# Patient Record
Sex: Female | Born: 1960 | Race: Black or African American | Hispanic: No | Marital: Single | Smoking: Never smoker
Health system: Southern US, Community
[De-identification: ages and names within clinical notes are randomized; demographics above are authoritative.]

## PROBLEM LIST (undated history)

## (undated) DIAGNOSIS — K635 Polyp of colon: Secondary | ICD-10-CM

## (undated) DIAGNOSIS — I10 Essential (primary) hypertension: Secondary | ICD-10-CM

## (undated) DIAGNOSIS — Z862 Personal history of diseases of the blood and blood-forming organs and certain disorders involving the immune mechanism: Secondary | ICD-10-CM

## (undated) DIAGNOSIS — M2550 Pain in unspecified joint: Secondary | ICD-10-CM

## (undated) DIAGNOSIS — M1990 Unspecified osteoarthritis, unspecified site: Secondary | ICD-10-CM

## (undated) DIAGNOSIS — F419 Anxiety disorder, unspecified: Secondary | ICD-10-CM

## (undated) DIAGNOSIS — F329 Major depressive disorder, single episode, unspecified: Secondary | ICD-10-CM

## (undated) DIAGNOSIS — M549 Dorsalgia, unspecified: Secondary | ICD-10-CM

## (undated) DIAGNOSIS — K259 Gastric ulcer, unspecified as acute or chronic, without hemorrhage or perforation: Secondary | ICD-10-CM

## (undated) DIAGNOSIS — R079 Chest pain, unspecified: Secondary | ICD-10-CM

## (undated) DIAGNOSIS — R519 Headache, unspecified: Secondary | ICD-10-CM

## (undated) DIAGNOSIS — E039 Hypothyroidism, unspecified: Secondary | ICD-10-CM

## (undated) DIAGNOSIS — E079 Disorder of thyroid, unspecified: Secondary | ICD-10-CM

## (undated) DIAGNOSIS — T7840XA Allergy, unspecified, initial encounter: Secondary | ICD-10-CM

## (undated) DIAGNOSIS — F32A Depression, unspecified: Secondary | ICD-10-CM

## (undated) DIAGNOSIS — K589 Irritable bowel syndrome without diarrhea: Secondary | ICD-10-CM

## (undated) DIAGNOSIS — E0500 Thyrotoxicosis with diffuse goiter without thyrotoxic crisis or storm: Secondary | ICD-10-CM

## (undated) DIAGNOSIS — Z8489 Family history of other specified conditions: Secondary | ICD-10-CM

## (undated) DIAGNOSIS — E559 Vitamin D deficiency, unspecified: Secondary | ICD-10-CM

## (undated) DIAGNOSIS — G43909 Migraine, unspecified, not intractable, without status migrainosus: Secondary | ICD-10-CM

## (undated) DIAGNOSIS — R600 Localized edema: Secondary | ICD-10-CM

## (undated) DIAGNOSIS — D649 Anemia, unspecified: Secondary | ICD-10-CM

## (undated) DIAGNOSIS — K219 Gastro-esophageal reflux disease without esophagitis: Secondary | ICD-10-CM

## (undated) DIAGNOSIS — E785 Hyperlipidemia, unspecified: Secondary | ICD-10-CM

## (undated) HISTORY — PX: JOINT REPLACEMENT: SHX530

## (undated) HISTORY — PX: GASTRIC BYPASS: SHX52

## (undated) HISTORY — DX: Anxiety disorder, unspecified: F41.9

## (undated) HISTORY — DX: Irritable bowel syndrome, unspecified: K58.9

## (undated) HISTORY — DX: Hypothyroidism, unspecified: E03.9

## (undated) HISTORY — PX: POLYPECTOMY: SHX149

## (undated) HISTORY — DX: Pain in unspecified joint: M25.50

## (undated) HISTORY — DX: Gastro-esophageal reflux disease without esophagitis: K21.9

## (undated) HISTORY — DX: Anemia, unspecified: D64.9

## (undated) HISTORY — DX: Chest pain, unspecified: R07.9

## (undated) HISTORY — DX: Hyperlipidemia, unspecified: E78.5

## (undated) HISTORY — DX: Localized edema: R60.0

## (undated) HISTORY — DX: Polyp of colon: K63.5

## (undated) HISTORY — DX: Gastric ulcer, unspecified as acute or chronic, without hemorrhage or perforation: K25.9

## (undated) HISTORY — DX: Migraine, unspecified, not intractable, without status migrainosus: G43.909

## (undated) HISTORY — PX: COLONOSCOPY: SHX174

## (undated) HISTORY — DX: Unspecified osteoarthritis, unspecified site: M19.90

## (undated) HISTORY — DX: Allergy, unspecified, initial encounter: T78.40XA

## (undated) HISTORY — DX: Vitamin D deficiency, unspecified: E55.9

## (undated) HISTORY — DX: Dorsalgia, unspecified: M54.9

---
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
## 2013-06-27 ENCOUNTER — Encounter (HOSPITAL_COMMUNITY): Payer: Self-pay | Admitting: *Deleted

## 2013-06-27 ENCOUNTER — Inpatient Hospital Stay (HOSPITAL_COMMUNITY): Payer: Self-pay

## 2013-06-27 ENCOUNTER — Emergency Department (HOSPITAL_COMMUNITY): Payer: Self-pay

## 2013-06-27 ENCOUNTER — Inpatient Hospital Stay (HOSPITAL_COMMUNITY)
Admission: EM | Admit: 2013-06-27 | Discharge: 2013-06-29 | DRG: 312 | Disposition: A | Discharge status: Home / Self Care | Payer: Self-pay | Attending: Internal Medicine | Admitting: Internal Medicine

## 2013-06-27 DIAGNOSIS — D250 Submucous leiomyoma of uterus: Secondary | ICD-10-CM | POA: Diagnosis present

## 2013-06-27 DIAGNOSIS — M545 Low back pain, unspecified: Secondary | ICD-10-CM | POA: Diagnosis present

## 2013-06-27 DIAGNOSIS — D219 Benign neoplasm of connective and other soft tissue, unspecified: Secondary | ICD-10-CM

## 2013-06-27 DIAGNOSIS — I951 Orthostatic hypotension: Principal | ICD-10-CM | POA: Diagnosis present

## 2013-06-27 DIAGNOSIS — E876 Hypokalemia: Secondary | ICD-10-CM | POA: Diagnosis present

## 2013-06-27 DIAGNOSIS — M19019 Primary osteoarthritis, unspecified shoulder: Secondary | ICD-10-CM | POA: Diagnosis present

## 2013-06-27 DIAGNOSIS — E860 Dehydration: Secondary | ICD-10-CM | POA: Diagnosis present

## 2013-06-27 DIAGNOSIS — N179 Acute kidney failure, unspecified: Secondary | ICD-10-CM

## 2013-06-27 DIAGNOSIS — Z885 Allergy status to narcotic agent status: Secondary | ICD-10-CM

## 2013-06-27 DIAGNOSIS — R55 Syncope and collapse: Secondary | ICD-10-CM | POA: Diagnosis present

## 2013-06-27 DIAGNOSIS — G43909 Migraine, unspecified, not intractable, without status migrainosus: Secondary | ICD-10-CM | POA: Diagnosis present

## 2013-06-27 DIAGNOSIS — I959 Hypotension, unspecified: Secondary | ICD-10-CM

## 2013-06-27 DIAGNOSIS — D649 Anemia, unspecified: Secondary | ICD-10-CM | POA: Diagnosis present

## 2013-06-27 DIAGNOSIS — Q762 Congenital spondylolisthesis: Secondary | ICD-10-CM

## 2013-06-27 DIAGNOSIS — M1990 Unspecified osteoarthritis, unspecified site: Secondary | ICD-10-CM | POA: Insufficient documentation

## 2013-06-27 DIAGNOSIS — G8929 Other chronic pain: Secondary | ICD-10-CM

## 2013-06-27 DIAGNOSIS — M519 Unspecified thoracic, thoracolumbar and lumbosacral intervertebral disc disorder: Secondary | ICD-10-CM | POA: Diagnosis present

## 2013-06-27 DIAGNOSIS — M5126 Other intervertebral disc displacement, lumbar region: Secondary | ICD-10-CM | POA: Diagnosis present

## 2013-06-27 DIAGNOSIS — G4700 Insomnia, unspecified: Secondary | ICD-10-CM | POA: Diagnosis present

## 2013-06-27 DIAGNOSIS — G8911 Acute pain due to trauma: Secondary | ICD-10-CM | POA: Diagnosis present

## 2013-06-27 DIAGNOSIS — I10 Essential (primary) hypertension: Secondary | ICD-10-CM | POA: Diagnosis present

## 2013-06-27 DIAGNOSIS — IMO0002 Reserved for concepts with insufficient information to code with codable children: Secondary | ICD-10-CM

## 2013-06-27 DIAGNOSIS — E039 Hypothyroidism, unspecified: Secondary | ICD-10-CM | POA: Diagnosis present

## 2013-06-27 DIAGNOSIS — K5900 Constipation, unspecified: Secondary | ICD-10-CM | POA: Diagnosis present

## 2013-06-27 DIAGNOSIS — Z9884 Bariatric surgery status: Secondary | ICD-10-CM

## 2013-06-27 HISTORY — DX: Hypokalemia: E87.6

## 2013-06-27 HISTORY — DX: Other chronic pain: G89.29

## 2013-06-27 HISTORY — DX: Syncope and collapse: R55

## 2013-06-27 HISTORY — DX: Disorder of thyroid, unspecified: E07.9

## 2013-06-27 HISTORY — DX: Acute kidney failure, unspecified: N17.9

## 2013-06-27 HISTORY — DX: Essential (primary) hypertension: I10

## 2013-06-27 LAB — CBC
HCT: 37.7 % (ref 36.0–46.0)
Hemoglobin: 13.4 g/dL (ref 12.0–15.0)
MCH: 29.5 pg (ref 26.0–34.0)
MCHC: 35.5 g/dL (ref 30.0–36.0)
MCV: 82.9 fL (ref 78.0–100.0)
RDW: 14.6 % (ref 11.5–15.5)

## 2013-06-27 LAB — BASIC METABOLIC PANEL
BUN: 36 mg/dL — ABNORMAL HIGH (ref 6–23)
Calcium: 10.5 mg/dL (ref 8.4–10.5)
Creatinine, Ser: 2.04 mg/dL — ABNORMAL HIGH (ref 0.50–1.10)
GFR calc Af Amer: 31 mL/min — ABNORMAL LOW (ref >90)
GFR calc non Af Amer: 27 mL/min — ABNORMAL LOW (ref >90)
Glucose, Bld: 133 mg/dL — ABNORMAL HIGH (ref 70–99)
Potassium: 2.8 mEq/L — ABNORMAL LOW (ref 3.5–5.1)

## 2013-06-27 MED ORDER — SODIUM CHLORIDE 0.9 % IV SOLN
INTRAVENOUS | Status: AC
  Administered 2013-06-27: 1000 mL via INTRAVENOUS

## 2013-06-27 MED ORDER — ONDANSETRON HCL 4 MG/2ML IJ SOLN
4.0000 mg | Freq: Four times a day (QID) | INTRAMUSCULAR | Status: DC
  Administered 2013-06-28 – 2013-06-29 (×4): 4 mg via INTRAVENOUS
  Filled 2013-06-27 (×6): qty 2

## 2013-06-27 MED ORDER — ACETAMINOPHEN 325 MG PO TABS: 650.0000 mg | ORAL_TABLET | Freq: Four times a day (QID) | ORAL | Status: DC | PRN

## 2013-06-27 MED ORDER — ZOLPIDEM TARTRATE 5 MG PO TABS
5.0000 mg | ORAL_TABLET | Freq: Every evening | ORAL | Status: DC | PRN
  Administered 2013-06-27 – 2013-06-29 (×2): 5 mg via ORAL
  Filled 2013-06-27 (×2): qty 1

## 2013-06-27 MED ORDER — THYROID 30 MG PO TABS
30.0000 mg | ORAL_TABLET | Freq: Every day | ORAL | Status: DC
  Administered 2013-06-28 – 2013-06-29 (×2): 30 mg via ORAL
  Filled 2013-06-27 (×4): qty 1

## 2013-06-27 MED ORDER — POTASSIUM CHLORIDE 10 MEQ/100ML IV SOLN
10.0000 meq | INTRAVENOUS | Status: AC
Start: 2013-06-27 — End: 2013-06-27
  Administered 2013-06-27 (×4): 10 meq via INTRAVENOUS
  Filled 2013-06-27: qty 400

## 2013-06-27 MED ORDER — FENTANYL CITRATE 0.05 MG/ML IJ SOLN
100.0000 ug | Freq: Once | INTRAMUSCULAR | Status: AC
  Administered 2013-06-27: 100 ug via INTRAVENOUS
  Filled 2013-06-27: qty 2

## 2013-06-27 MED ORDER — ASPIRIN EC 81 MG PO TBEC
81.0000 mg | DELAYED_RELEASE_TABLET | Freq: Every day | ORAL | Status: DC
  Administered 2013-06-27 – 2013-06-29 (×3): 81 mg via ORAL
  Filled 2013-06-27 (×3): qty 1

## 2013-06-27 MED ORDER — MORPHINE SULFATE 2 MG/ML IJ SOLN
2.0000 mg | INTRAMUSCULAR | Status: DC | PRN
  Administered 2013-06-27 – 2013-06-28 (×2): 2 mg via INTRAVENOUS
  Filled 2013-06-27 (×3): qty 1

## 2013-06-27 MED ORDER — FENTANYL CITRATE 0.05 MG/ML IJ SOLN: 100.0000 ug | INTRAMUSCULAR | Status: DC | PRN

## 2013-06-27 MED ORDER — ENOXAPARIN SODIUM 40 MG/0.4ML ~~LOC~~ SOLN
40.0000 mg | SUBCUTANEOUS | Status: DC
  Administered 2013-06-27 – 2013-06-28 (×2): 40 mg via SUBCUTANEOUS
  Filled 2013-06-27 (×3): qty 0.4

## 2013-06-27 MED ORDER — SODIUM CHLORIDE 0.9 % IV BOLUS (SEPSIS)
500.0000 mL | Freq: Once | INTRAVENOUS | Status: AC
  Administered 2013-06-27: 500 mL via INTRAVENOUS

## 2013-06-27 MED ORDER — ACETAMINOPHEN 650 MG RE SUPP: 650.0000 mg | Freq: Four times a day (QID) | RECTAL | Status: DC | PRN

## 2013-06-27 MED ORDER — ONDANSETRON HCL 4 MG/2ML IJ SOLN: 4.0000 mg | Freq: Three times a day (TID) | INTRAMUSCULAR | Status: AC | PRN

## 2013-06-27 MED ORDER — KETOROLAC TROMETHAMINE 30 MG/ML IJ SOLN
30.0000 mg | Freq: Once | INTRAMUSCULAR | Status: AC
  Administered 2013-06-27: 30 mg via INTRAVENOUS
  Filled 2013-06-27: qty 1

## 2013-06-27 MED ORDER — SODIUM CHLORIDE 0.9 % IV SOLN
INTRAVENOUS | Status: DC
  Administered 2013-06-28: 1000 mL via INTRAVENOUS
  Administered 2013-06-28 – 2013-06-29 (×2): via INTRAVENOUS
  Administered 2013-06-29: 1000 mL via INTRAVENOUS

## 2013-06-27 MED ORDER — SODIUM CHLORIDE 0.9 % IV BOLUS (SEPSIS)
1000.0000 mL | Freq: Once | INTRAVENOUS | Status: AC
  Administered 2013-06-27: 1000 mL via INTRAVENOUS

## 2013-06-27 MED ORDER — POTASSIUM CHLORIDE CRYS ER 20 MEQ PO TBCR
40.0000 meq | EXTENDED_RELEASE_TABLET | Freq: Once | ORAL | Status: AC
  Administered 2013-06-27: 40 meq via ORAL
  Filled 2013-06-27: qty 2

## 2013-06-27 MED ORDER — ONDANSETRON HCL 4 MG/2ML IJ SOLN
4.0000 mg | Freq: Once | INTRAMUSCULAR | Status: AC
  Administered 2013-06-27: 4 mg via INTRAVENOUS
  Filled 2013-06-27: qty 2

## 2013-06-27 NOTE — H&P (Signed)
[facility] History and Physical  Julie Miranda OVF:[id number] DOB: 11/22/1960 DOA: 06/27/2013  Referring physician:  PCP: Egbert Garibaldi, NP  Specialists:   Chief Complaint: LOC, striking back on sink    HPI: Julie Miranda is a 52 y.o. BF PMHx hypothyroidism, HTN, left shoulder arthritis, S/P Gastric Bypass,  Pt states that she has been having dizziness that has progressed over the last couple of months and now constant. Pt has had several falls related to dizziness. Yesterday patient passed out and fell and struck lower back on sink. Pt is pale and hypotensive at triage. Patient is on Depakote shots has not had a period for for quite some time. Patient also states she takes medicine for high blood pressure and takes an NSAID for headache and back pain. Upon further questioning patient admits to taking doses of NSAIDs (Goody's powder, BC powder). States was given tramadol for migraines, and takes unknown water pill. States recently she doesn't see her PCP regularly is lack of insurance.    Procedure MRI L-spine without contrast 06/27/2013 L4-5: Mild left and borderline right subarticular lateral recess  stenosis due to left paracentral disc protrusion and underlying disc  bulge. Right facet arthropathy noted     Review of Systems: The patient denies anorexia, fever, weight loss,, vision loss, decreased hearing, hoarseness, chest pain, dyspnea on exertion, peripheral edema, balance deficits, hemoptysis, abdominal pain, melena, hematochezia, severe indigestion/heartburn, hematuria, incontinence, genital sores, muscle weakness, suspicious skin lesions, transient blindness, difficulty walking, depression, unusual weight change, abnormal bleeding, enlarged lymph nodes, angioedema, and breast masses.      Past Medical History  Diagnosis Date  . Thyroid disease   . Hypertension    Past Surgical History  Procedure Laterality Date  . Gastric bypass     Social History:  reports  that she has never smoked. She does not have any smokeless tobacco history on file. She reports that she does not drink alcohol or use illicit drugs.    Allergies  Allergen Reactions  . Morphine And Related Nausea And Vomiting    No family history on file.  Prior to Admission medications   Medication Sig Start Date End Date Taking? Authorizing Provider  Estradiol Cypionate (DEPO-ESTRADIOL IM) Inject 1 application into the muscle every 3 (three) months.   Yes Historical Provider, MD  lisinopril-hydrochlorothiazide (PRINZIDE,ZESTORETIC) 20-12.5 MG per tablet Take 1 tablet by mouth daily.   Yes Historical Provider, MD  meloxicam (MOBIC) 15 MG tablet Take 15 mg by mouth daily.   Yes Historical Provider, MD  Menthol, Topical Analgesic, (ICY HOT EX) Apply 1 application topically 2 (two) times daily as needed. por pain/spasms   Yes Historical Provider, MD  predniSONE (DELTASONE) 20 MG tablet Take 20 mg by mouth 3 (three) times daily.   Yes Historical Provider, MD  thyroid (ARMOUR) 30 MG tablet Take 30 mg by mouth daily before breakfast.   Yes Historical Provider, MD  traMADol (ULTRAM) 50 MG tablet Take 50 mg by mouth every 6 (six) hours as needed for pain.   Yes Historical Provider, MD   Physical Exam: Filed Vitals:   06/27/13 1330 06/27/13 1433 06/27/13 1445 06/27/13 1500  BP: 97/66 103/64 101/63 103/65  Pulse: 63 77 72 72  Temp:      TempSrc:      Resp: 31 16 15 14   SpO2: 100% 98% 100% 99%     General: Alert, uncomfortable secondary to back pain  Eyes: Pupils and react to light and accommodation  Neck: Negative  JVD  Cardiovascular: Regular rhythm and rate, negative murmurs rubs gallops, DP/PT pulse 2+ bilateral  Respiratory: Good auscultation bilateral  Abdomen: Soft, nontender, nondistended plus bowel sounds  Musculoskeletal: Pain to palpation T12-S1 midline  Neurologic: Equal round reactive to light and accommodation, nerves II through XII intact, moves all extremities to  command upper extremity/lower extremities strength 5/5, chronic bilateral feet paresthesia  Labs on Admission:  Basic Metabolic Panel:  Recent Labs Lab 06/27/13 1008  NA 136  K 2.8*  CL 97  CO2 22  GLUCOSE 133*  BUN 36*  CREATININE 2.04*  CALCIUM 10.5   Liver Function Tests: No results found for this basename: AST, ALT, ALKPHOS, BILITOT, PROT, ALBUMIN,  in the last 168 hours No results found for this basename: LIPASE, AMYLASE,  in the last 168 hours No results found for this basename: AMMONIA,  in the last 168 hours CBC:  Recent Labs Lab 06/27/13 1008  WBC 3.4*  HGB 13.4  HCT 37.7  MCV 82.9  PLT 218   Cardiac Enzymes: No results found for this basename: CKTOTAL, CKMB, CKMBINDEX, TROPONINI,  in the last 168 hours  BNP (last 3 results) No results found for this basename: PROBNP,  in the last 8760 hours CBG:  Recent Labs Lab 06/27/13 1127  GLUCAP 91    Radiological Exams on Admission: Mr Lumbar Spine Wo Contrast  06/27/2013   CLINICAL DATA:  Low back pain. Fall in shower. Left radiculopathy.  EXAM: MRI LUMBAR SPINE WITHOUT CONTRAST  TECHNIQUE: Multiplanar, multisequence MR imaging was performed. No intravenous contrast was administered.  COMPARISON:  None.  FINDINGS: Unusual appearance of the visualize uterus with endometrium at 1.5 cm anterior-posterior, with several small submucosal fibroids measuring up to 9 mm in short axis, and with heterogeneous fundal signal intensity on T2 weighted images. The entirety of the uterus is not imaged.  There is straightening of the normal lumbar lordosis. The lowest lumbar type non-rib-bearing vertebra is labeled as L5. The conus medullaris appears normal. Conus level: L1.  There is 3 mm degenerative anterolisthesis at L4-5 with mild loss of intervertebral disc height and disc desiccation.  Despite efforts by the technologist and patient, motion artifact is present on today's exam and could not be eliminated. This reduces exam  sensitivity and specificity. Degenerative facet arthropathy noted on the right at L4-5 with subcortical cysts and low level subcortical edema.  Additional findings at individual levels are as follows:  L1-2: Unremarkable.  L2-3: Unremarkable.  L3-4: Unremarkable.  L4-5: Mild left and borderline right subarticular lateral recess stenosis due to left paracentral disc protrusion and underlying disc bulge. Right facet arthropathy noted.  L5-S1:  No impingement. Diffuse disc bulge.  IMPRESSION: 1. Mild left eccentric impingement at L4-5 due to disc protrusion and underlying disc bulge. 2. Abnormal appearance of the uterus, with thickened endometrium, suspected submucosal fibroids, and fundal heterogeneity a in the myometrium which may be due to adenomyosis or nearly isointense fibroid. Dedicated pelvic sonography is recommended for further characterization, particularly if the patient is postmenopausal.   Electronically Signed   By: Herbie Baltimore   On: 06/27/2013 14:32    EKG: Normal sinus rhythm  Assessment/Plan Active Problems:   * No active hospital problems. *   1. Hypotension; hold all hypertensive medications, hydrate aggressively overnight  2. Hypertension; see #1  3. Syncope and collapse; most likely secondary to patient's hypotensive state --Obtain orthostatic BP in the a.m.  4. Acute renal failure; no values to compare her current creatinine level  to.  --Obtain renal ultrasound --If no improvement in creatinine level consider renal consult  5. Hypokalemia; replete and obtain BMP 2359  6. Chronic back pain; MAP sufficient at this time to start small dose of morphine premedicated with Zofran for nausea  7. herniated vertebral disc; in the a.m. after patient stable consider  neurosurgery consult; patient currently neurologically intact throughout --Neuro checks throughout the night   8. Insomnia; Ambien  Code Status: Full Disposition Plan:   Time spent: 60 minutes  Drema Dallas [facility] Pager [phone]  If 7PM-7AM, please contact night-coverage [email or web address] Password [facility] 06/27/2013, 3:47 PM

## 2013-06-27 NOTE — ED Notes (Signed)
Pt states that she has been having dizziness that has progressed over the last couple of months and now constant.  Pt has had several falls related to dizziness.  Yesterday patient passed out and fell and has pain to  Lower back.  Pt is pale and hypotensive at triage.

## 2013-06-27 NOTE — ED Notes (Signed)
Report given to floor RN, Janna Arch. Nurse has no further questions upon report given. Pt being prepared for transport to floor.

## 2013-06-27 NOTE — ED Notes (Signed)
MRI called and questioned about the delay. MRI responded saying they have been posted for her to go

## 2013-06-27 NOTE — ED Provider Notes (Signed)
CSN: [id number]     Arrival date & time 06/27/13  9562 History   First MD Initiated Contact with Patient 06/27/13 1039     Chief Complaint  Patient presents with  . Dizziness  . Fall    HPI Pt states that she has been having dizziness that has progressed over the last couple of months and now constant. Pt has had several falls related to dizziness. Yesterday patient passed out and fell and has pain to Lower back. Pt is pale and hypotensive at triage.  Patient is on Depakote shots has not had a period for for quite some time.  Patient also states she takes medicine for high blood pressure and takes an NSAID for headache and back pain.  Past Medical History  Diagnosis Date  . Thyroid disease   . Hypertension    Past Surgical History  Procedure Laterality Date  . Gastric bypass     No family history on file. History  Substance Use Topics  . Smoking status: Never Smoker   . Smokeless tobacco: Not on file  . Alcohol Use: No   OB History   Grav Para Term Preterm Abortions TAB SAB Ect Mult Living                 Review of Systems All other systems reviewed and are negative Allergies  Morphine and related  Home Medications   Current Outpatient Rx  Name  Route  Sig  Dispense  Refill  . Estradiol Cypionate (DEPO-ESTRADIOL IM)   Intramuscular   Inject 1 application into the muscle every 3 (three) months.         . Menthol, Topical Analgesic, (ICY HOT EX)   Apply externally   Apply 1 application topically 2 (two) times daily as needed. por pain/spasms         . predniSONE (DELTASONE) 20 MG tablet   Oral   Take 20 mg by mouth 3 (three) times daily.         Marland Kitchen thyroid (ARMOUR) 30 MG tablet   Oral   Take 30 mg by mouth daily before breakfast.         . baclofen (LIORESAL) 10 MG tablet   Oral   Take 1 tablet (10 mg total) by mouth 3 (three) times daily.   30 each   0   . docusate sodium 100 MG CAPS   Oral   Take 100 mg by mouth 2 (two) times daily.   10  capsule   0   . ranitidine (ZANTAC) 150 MG tablet   Oral   Take 1 tablet (150 mg total) by mouth 2 (two) times daily.   30 tablet   12    BP 97/60  Pulse 85  Temp(Src) 98.5 F (36.9 C) (Oral)  Resp 13  Ht 5\' 5"  (1.651 m)  Wt 176 lb 12.9 oz (80.2 kg)  BMI 29.42 kg/m2  SpO2 100% Physical Exam  Nursing note and vitals reviewed. Constitutional: She is oriented to person, place, and time. She appears well-developed and well-nourished. She appears distressed.  HENT:  Head: Normocephalic and atraumatic.  Eyes: Pupils are equal, round, and reactive to light.  Neck: Normal range of motion.  Cardiovascular: Normal rate and intact distal pulses.   Pulmonary/Chest: No respiratory distress.  Abdominal: Normal appearance. She exhibits no distension.  Musculoskeletal:       Back:  Neurological: She is alert and oriented to person, place, and time. No cranial nerve deficit.  Skin: Skin  is warm and dry. No rash noted.  Psychiatric: She has a normal mood and affect. Her behavior is normal.    ED Course  Procedures (including critical care time)   Date: 06/27/2013  Rate: 99  Rhythm: normal sinus rhythm  QRS Axis: normal  Intervals: normal  ST/T Wave abnormalities: normal  Conduction Disutrbances: none  Narrative Interpretation: unremarkable  Medications  ondansetron (ZOFRAN) injection 4 mg (not administered)  sodium chloride 0.9 % bolus 1,000 mL (0 mLs Intravenous Stopped 06/27/13 1206)  fentaNYL (SUBLIMAZE) injection 100 mcg (100 mcg Intravenous Given 06/27/13 1110)  ondansetron (ZOFRAN) injection 4 mg (4 mg Intravenous Given 06/27/13 1105)  ketorolac (TORADOL) 30 MG/ML injection 30 mg (30 mg Intravenous Given 06/27/13 1108)  fentaNYL (SUBLIMAZE) injection 100 mcg (100 mcg Intravenous Given 06/27/13 1214)  sodium chloride 0.9 % bolus 1,000 mL (0 mLs Intravenous Stopped 06/27/13 1310)  sodium chloride 0.9 % bolus 500 mL (0 mLs Intravenous Stopped 06/27/13 1430)  potassium chloride SA  (K-DUR,KLOR-CON) CR tablet 40 mEq (40 mEq Oral Given 06/27/13 1543)  0.9 %  sodium chloride infusion ( Intravenous Rate/Dose Verify 06/28/13 0600)  potassium chloride 10 mEq in 100 mL IVPB (10 mEq Intravenous Given 06/27/13 2230)      Labs Review Labs Reviewed  CBC - Abnormal; Notable for the following:    WBC 3.4 (*)    All other components within normal limits  BASIC METABOLIC PANEL - Abnormal; Notable for the following:    Potassium 2.8 (*)    Glucose, Bld 133 (*)    BUN 36 (*)    Creatinine, Ser 2.04 (*)    GFR calc non Af Amer 27 (*)    GFR calc Af Amer 31 (*)    All other components within normal limits  COMPREHENSIVE METABOLIC PANEL - Abnormal; Notable for the following:    Chloride 115 (*)    Creatinine, Ser 1.42 (*)    Total Protein 5.2 (*)    Albumin 3.1 (*)    GFR calc non Af Amer 42 (*)    GFR calc Af Amer 49 (*)    All other components within normal limits  CBC WITH DIFFERENTIAL - Abnormal; Notable for the following:    WBC 3.2 (*)    RBC 3.41 (*)    Hemoglobin 10.0 (*)    HCT 28.9 (*)    Neutrophils Relative % 40 (*)    Neutro Abs 1.3 (*)    Lymphocytes Relative 50 (*)    All other components within normal limits  MAGNESIUM - Abnormal; Notable for the following:    Magnesium 2.7 (*)    All other components within normal limits  BASIC METABOLIC PANEL - Abnormal; Notable for the following:    BUN 29 (*)    Creatinine, Ser 1.55 (*)    GFR calc non Af Amer 38 (*)    GFR calc Af Amer 44 (*)    All other components within normal limits  COMPREHENSIVE METABOLIC PANEL - Abnormal; Notable for the following:    Chloride 116 (*)    Total Protein 4.9 (*)    Albumin 2.8 (*)    Alkaline Phosphatase 37 (*)    GFR calc non Af Amer 58 (*)    GFR calc Af Amer 67 (*)    All other components within normal limits  CBC WITH DIFFERENTIAL - Abnormal; Notable for the following:    WBC 3.0 (*)    RBC 3.43 (*)    Hemoglobin 9.9 (*)  HCT 29.4 (*)    Neutrophils Relative % 30  (*)    Lymphocytes Relative 59 (*)    Basophils Relative 2 (*)    Neutro Abs 0.9 (*)    All other components within normal limits  MRSA PCR SCREENING  GLUCOSE, CAPILLARY  PREGNANCY, URINE  PATHOLOGIST SMEAR REVIEW  CG4 I-STAT (LACTIC ACID)   Imaging Review No results found.  MDM   1. Acute renal injury   2. Hypokalemia   3. Herniated disc   4. Acute renal failure   5. Chronic lower back pain   6. Herniated vertebral disc   7. Syncope and collapse   8. Acute low back pain due to trauma   9. Unspecified hypothyroidism   10. Essential hypertension, benign   11. 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 Hypotension, unspecified   13. Dehydration   14. Fibroids   15. Anemia, unspecified   16. Arthritis        Nelia Shi, MD 07/02/13 1038

## 2013-06-28 DIAGNOSIS — D219 Benign neoplasm of connective and other soft tissue, unspecified: Secondary | ICD-10-CM | POA: Diagnosis present

## 2013-06-28 DIAGNOSIS — D649 Anemia, unspecified: Secondary | ICD-10-CM

## 2013-06-28 DIAGNOSIS — M545 Low back pain, unspecified: Secondary | ICD-10-CM | POA: Diagnosis present

## 2013-06-28 DIAGNOSIS — G8911 Acute pain due to trauma: Secondary | ICD-10-CM

## 2013-06-28 DIAGNOSIS — E860 Dehydration: Secondary | ICD-10-CM | POA: Diagnosis present

## 2013-06-28 HISTORY — DX: Low back pain, unspecified: M54.50

## 2013-06-28 HISTORY — DX: Acute pain due to trauma: G89.11

## 2013-06-28 HISTORY — DX: Dehydration: E86.0

## 2013-06-28 HISTORY — DX: Anemia, unspecified: D64.9

## 2013-06-28 LAB — COMPREHENSIVE METABOLIC PANEL
ALT: 16 U/L (ref 0–35)
AST: 15 U/L (ref 0–37)
Alkaline Phosphatase: 41 U/L (ref 39–117)
CO2: 21 mEq/L (ref 19–32)
Chloride: 115 mEq/L — ABNORMAL HIGH (ref 96–112)
GFR calc non Af Amer: 42 mL/min — ABNORMAL LOW (ref >90)
Potassium: 4 mEq/L (ref 3.5–5.1)
Sodium: 144 mEq/L (ref 135–145)
Total Bilirubin: 0.5 mg/dL (ref 0.3–1.2)

## 2013-06-28 LAB — BASIC METABOLIC PANEL
BUN: 29 mg/dL — ABNORMAL HIGH (ref 6–23)
Calcium: 9.1 mg/dL (ref 8.4–10.5)
GFR calc non Af Amer: 38 mL/min — ABNORMAL LOW (ref >90)
Glucose, Bld: 84 mg/dL (ref 70–99)
Sodium: 140 mEq/L (ref 135–145)

## 2013-06-28 LAB — CBC WITH DIFFERENTIAL/PLATELET
Basophils Relative: 1 % (ref 0–1)
Eosinophils Absolute: 0.1 10*3/uL (ref 0.0–0.7)
Hemoglobin: 10 g/dL — ABNORMAL LOW (ref 12.0–15.0)
MCH: 29.3 pg (ref 26.0–34.0)
MCHC: 34.6 g/dL (ref 30.0–36.0)
Monocytes Relative: 7 % (ref 3–12)
Neutrophils Relative %: 40 % — ABNORMAL LOW (ref 43–77)
Platelets: 165 10*3/uL (ref 150–400)

## 2013-06-28 LAB — PREGNANCY, URINE: Preg Test, Ur: NEGATIVE

## 2013-06-28 LAB — MRSA PCR SCREENING: MRSA by PCR: NEGATIVE

## 2013-06-28 MED ORDER — MAGNESIUM HYDROXIDE 400 MG/5ML PO SUSP
15.0000 mL | Freq: Every day | ORAL | Status: DC | PRN
  Administered 2013-06-28 – 2013-06-29 (×2): 15 mL via ORAL
  Filled 2013-06-28 (×3): qty 30

## 2013-06-28 MED ORDER — DOCUSATE SODIUM 100 MG PO CAPS
100.0000 mg | ORAL_CAPSULE | Freq: Two times a day (BID) | ORAL | Status: DC
  Administered 2013-06-28 – 2013-06-29 (×3): 100 mg via ORAL
  Filled 2013-06-28 (×4): qty 1

## 2013-06-28 MED ORDER — METHOCARBAMOL 100 MG/ML IJ SOLN
500.0000 mg | Freq: Four times a day (QID) | INTRAMUSCULAR | Status: DC | PRN
  Administered 2013-06-28 – 2013-06-29 (×2): 500 mg via INTRAVENOUS
  Filled 2013-06-28 (×3): qty 5

## 2013-06-28 MED ORDER — METHOCARBAMOL 500 MG PO TABS: 500.0000 mg | ORAL_TABLET | Freq: Four times a day (QID) | ORAL | Status: DC | PRN

## 2013-06-28 NOTE — Progress Notes (Signed)
Utilization Review Completed.  

## 2013-06-28 NOTE — Progress Notes (Signed)
Nutrition Brief Note  Patient identified on the Malnutrition Screening Tool (MST) Report for eating poorly because of a decreased appetite.  Wt Readings from Last 15 Encounters:  06/28/13 175 lb 14.6 oz (79.794 kg)    Body mass index is 29.27 kg/(m^2). Patient meets criteria for Overweight based on current BMI.   Current diet order is Clear Liquids, patient is consuming approximately 100% of meals at this time.  Patient reports feeling nauseous this AM, however, feels her PO intake will be adequate once her diet is advanced to solids.    Labs and medications reviewed.  No nutrition interventions warranted at this time.  If nutrition issues arise, please consult RD.   Maureen Chatters, RD, LDN Pager #: [phone] After-Hours Pager #: [phone]

## 2013-06-28 NOTE — Progress Notes (Addendum)
[facility] Progress Note [place] TEAM 1 - Stepdown ICU Team   Julie Miranda WNU:[id number] DOB: 1961/01/04 DOA: 06/27/2013 PCP: Egbert Garibaldi, NP  Brief narrative: 52 y.o. BF PMHx hypothyroidism, HTN, left shoulder arthritis, S/P Gastric Bypass, Pt states that she has been having dizziness that has progressed over the last couple of months and now constant. Pt has had several falls related to dizziness. Yesterday patient passed out and fell and struck lower back on sink. Pt is pale and hypotensive at triage. Patient is on Depakote shots has not had a period for for quite some time. Patient also states she takes medicine for high blood pressure and takes an NSAID for headache and back pain. Upon further questioning patient admits to taking doses of NSAIDs (Goody's powder, BC powder). States was given tramadol for migraines, and takes unknown water pill. States recently she doesn't see her PCP regularly is lack of insurance.    Assessment/Plan: Active Problems:   Syncope and collapse due to A) Hypotension, orthostatic B) Dehydration -seems mediated by [facility] from diuretic component of BP med -BP off meds remains soft and pt may not require ant-HTN meds post weihjt loss -very nauseous with limited PO intake and with soft BP will continue IVF and treat sx's-clear liquids    Acute renal failure -Creatinine at presentation 2.04 now down to 1.42 with a normal BUN -was on ACE I with HCTZ pre admit- HOLD -baseline Scr unknown -follow lytes -Renal ultrasound normal    Essential hypertension, benign -BP soft and given presentation may no longer carry this dx   Acute left sided back pain due to fall/ L 4-5 bulge (mild) / Chronic lower back pain -only on Tylenol at home- ? Actually taking Mobic and stopped Ultram due to c/o nausea -cont IV Morphine - add IV Robaxin- if nausea improves, can switch to PO and d/c home on it -also has an acute new back pain over left hip and low back c/w  MS injury after syncope pre admit-    Hypokalemia -due to [facility] and diuretics- resolved   Constipation -MOM x 1 then Colace BID    Unspecified hypothyroidism -Armour thyroid at home    Uterine Fibroids -mentioned on MRI with caveat endometrial stripe thickened -pt says has not had GYN eval in several years    Anemia, unspecified -3 gm drop in hgb post IVF- likely dilutional but follow trend -FOB pending   DVT prophylaxis: Lovenox Code Status: Full Family Communication: Patient Disposition Plan/Expected LOS: Transfer to floor-continue to hydrate and maximize pain control-discharge after blood pressure more stable Isolation: None Nutritional Status: Appears to be acutely compromise related to dehydration and may have a chronic component due to chronic pain and constipation  Consultants: None  Procedures: None  Antibiotics: None  HPI/Subjective: Patient primarily complaining of left hip and flank pain which is new after a fall with syncopal episode pre-admission. No shortness of breath or chest pain. No current dizziness. Patient complains of having sharp pains she describes as gas in her upper back in the past she states this improves if she passes flatus. Possible reflux symptoms reported. No apparent melena or hematochezia.  Objective: Blood pressure 99/62, pulse 70, temperature 98.5 F (36.9 C), temperature source Oral, resp. rate 16, height 5\' 5"  (1.651 m), weight 79.794 kg (175 lb 14.6 oz), SpO2 100.00%.  Intake/Output Summary (Last 24 hours) at 06/28/13 1228 Last data filed at 06/28/13 1000  Gross per 24 hour  Intake   4600 ml  Output    650 ml  Net   3950 ml     Exam: General: No acute respiratory distress Lungs: Clear to auscultation bilaterally without wheezes or crackles, RA Cardiovascular: Regular rate and rhythm without murmur gallop or rub normal S1 and S2, no peripheral edema or JVD Abdomen: Nontender, nondistended, soft, bowel sounds positive, no  rebound, no ascites, no appreciable mass Musculoskeletal: No significant cyanosis, clubbing of bilateral lower extremities-pain reproducible on palpation over left hip flexor and over left flank and up towards left scapula Neurological: Alert and oriented x 3, moves all extremities x 4 without focal neurological deficits, CN 2-12 intact  Scheduled Meds:  Scheduled Meds: . aspirin EC  81 mg Oral Daily  . docusate sodium  100 mg Oral BID  . enoxaparin (LOVENOX) injection  40 mg Subcutaneous Q24H  . ondansetron (ZOFRAN) IV  4 mg Intravenous Q6H  . thyroid  30 mg Oral QAC breakfast   Continuous Infusions: . sodium chloride      **Reviewed in detail by the Attending Physicia  Data Reviewed: Basic Metabolic Panel:  Recent Labs Lab 06/27/13 1008 06/27/13 1009 06/27/13 2330 06/28/13 0510  NA 136  --  140 144  K 2.8*  --  4.0 4.0  CL 97  --  108 115*  CO2 22  --  20 21  GLUCOSE 133*  --  84 86  BUN 36*  --  29* 23  CREATININE 2.04*  --  1.55* 1.42*  CALCIUM 10.5  --  9.1 9.1  MG  --  2.7*  --   --    Liver Function Tests:  Recent Labs Lab 06/28/13 0510  AST 15  ALT 16  ALKPHOS 41  BILITOT 0.5  PROT 5.2*  ALBUMIN 3.1*   No results found for this basename: LIPASE, AMYLASE,  in the last 168 hours No results found for this basename: AMMONIA,  in the last 168 hours CBC:  Recent Labs Lab 06/27/13 1008 06/28/13 0510  WBC 3.4* 3.2*  NEUTROABS  --  1.3*  HGB 13.4 10.0*  HCT 37.7 28.9*  MCV 82.9 84.8  PLT 218 165   Cardiac Enzymes: No results found for this basename: CKTOTAL, CKMB, CKMBINDEX, TROPONINI,  in the last 168 hours BNP (last 3 results) No results found for this basename: PROBNP,  in the last 8760 hours CBG:  Recent Labs Lab 06/27/13 1127  GLUCAP 91    Recent Results (from the past 240 hour(s))  MRSA PCR SCREENING     Status: None   Collection Time    06/27/13 10:32 PM      Result Value Range Status   MRSA by PCR NEGATIVE  NEGATIVE Final    Comment:            The GeneXpert MRSA Assay (FDA     approved for NASAL specimens     only), is one component of a     comprehensive MRSA colonization     surveillance program. It is not     intended to diagnose MRSA     infection nor to guide or     monitor treatment for     MRSA infections.     Studies:  Recent x-ray studies have been reviewed in detail by the Attending Physician    Junious Silk, ANP [facility] Office  [phone] Pager [phone]  **If unable to reach the above provider after paging please contact the Flow Manager @ [phone]  On-Call/Text Page:  [email or web address]      password [email or web address]  If 7PM-7AM, please contact night-coverage [email or web address] Password [facility] 06/28/2013, 12:28 PM   LOS: 1 day   I have examined the patient, reviewed the chart and modified the above note which I agree with.   Wofford Stratton,MD [phone] 06/28/2013, 3:51 PM

## 2013-06-29 LAB — PATHOLOGIST SMEAR REVIEW

## 2013-06-29 LAB — CBC WITH DIFFERENTIAL/PLATELET
Basophils Relative: 2 % — ABNORMAL HIGH (ref 0–1)
Eosinophils Relative: 4 % (ref 0–5)
HCT: 29.4 % — ABNORMAL LOW (ref 36.0–46.0)
Hemoglobin: 9.9 g/dL — ABNORMAL LOW (ref 12.0–15.0)
Lymphocytes Relative: 59 % — ABNORMAL HIGH (ref 12–46)
MCH: 28.9 pg (ref 26.0–34.0)
Neutro Abs: 0.9 10*3/uL — ABNORMAL LOW (ref 1.7–7.7)
Neutrophils Relative %: 30 % — ABNORMAL LOW (ref 43–77)
RBC: 3.43 MIL/uL — ABNORMAL LOW (ref 3.87–5.11)

## 2013-06-29 LAB — COMPREHENSIVE METABOLIC PANEL
AST: 14 U/L (ref 0–37)
CO2: 19 mEq/L (ref 19–32)
Calcium: 8.8 mg/dL (ref 8.4–10.5)
Creatinine, Ser: 1.09 mg/dL (ref 0.50–1.10)
GFR calc Af Amer: 67 mL/min — ABNORMAL LOW (ref >90)
GFR calc non Af Amer: 58 mL/min — ABNORMAL LOW (ref >90)
Sodium: 143 mEq/L (ref 135–145)
Total Protein: 4.9 g/dL — ABNORMAL LOW (ref 6.0–8.3)

## 2013-06-29 MED ORDER — RANITIDINE HCL 150 MG PO TABS: 150.0000 mg | ORAL_TABLET | Freq: Two times a day (BID) | ORAL | Status: DC

## 2013-06-29 MED ORDER — DSS 100 MG PO CAPS: 100.0000 mg | ORAL_CAPSULE | Freq: Two times a day (BID) | ORAL | Status: DC

## 2013-06-29 MED ORDER — CYCLOBENZAPRINE HCL 10 MG PO TABS: 10.0000 mg | ORAL_TABLET | Freq: Three times a day (TID) | ORAL | Status: DC | PRN

## 2013-06-29 MED ORDER — BACLOFEN 10 MG PO TABS: 10.0000 mg | ORAL_TABLET | Freq: Three times a day (TID) | ORAL | Status: DC

## 2013-06-29 NOTE — Progress Notes (Signed)
Patient called back because she could not find her prescription. MD notified. Another prescription for baclofen faxed to [facility] pharmacy ([place]) at [phone], and patient is advised that Zantac and Colace can be purchased OTC.

## 2013-06-29 NOTE — Discharge Summary (Signed)
Physician Discharge Summary  Julie Miranda ZOX:[id number] DOB: [birth year] DOA: 06/27/2013  PCP: Egbert Garibaldi, NP  Admit date: 06/27/2013 Discharge date: 06/29/2013  Time spent: 30 minutes  Recommendations for Outpatient Follow-up:  1. Patient has been given contact information for the [facility] to establish as a new patient. 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 F PMHx hypothyroidism, HTN, left shoulder arthritis, S/P Gastric Bypass who stated that she had been having dizziness that had progressed over a couple of months and eventually became constant. Pt had several falls related to dizziness. The day before her admit the patient passed out and fell and struck lower back on a sink. Pt was pale and hypotensive at triage.  Patient stated she takes medicine for high blood pressure and takes an NSAID for headache and back pain. Upon further questioning patient admitted to taking frequent doses of NSAIDs (Goody's powder, BC powder). States she hadn't seen her PCP regularly due to lack of insurance.   Hospital Course:   Syncope and collapse due to  A) Hypotension, orthostatic  B) Dehydration  -seemed mediated by dehydration from the diuretic component of her BP med  -BP better - less dizzy  Acute renal failure  -Creatinine at  presentation 2.04 - down to 1.09 at discharge - resolved -was on ACE I with HCTZ pre admit - will discontinue since BP soft and suspect does not need anti HTN meds any longer -baseline Scr unknown  -Renal ultrasound normal   Essential hypertension, benign  -BP soft and given presentation she no longer carries this diagnosis   Acute left sided back pain due to fall / L 4-5 bulge (mild) / Chronic lower back pain  -Was using multiple NSAIDs at home - she endorsed stopping Ultram at home due to c/o nausea  -tolerated Robaxin inpatient - this is not available thru $4 prescriptions so will change to generic Baclofen since has much shorter half life than Flexeril so suspect will tolerate better with less nausea -also had an acute new back pain over left hip and low back c/w MS injury after syncope pre admit  Hypokalemia  -due to [facility] and diuretics - resolved   Constipation  -MOM x 1 with results -continue Colace BID after discharge  Unspecified hypothyroidism  -Armour thyroid at home   Uterine Fibroids  -mentioned on MRI with caveat endometrial stripe thickened  -pt says has not had GYN eval in several years  -will need OP referral to GYN after discharge [facility]   Anemia, unspecified  -3 gm drop in hgb post IVF- likely dilutional  -no gross evidence of GI blood loss while in hospital  -given recent NSAID use in this setting have instructed patient not to use NSAIDs  Procedures:  None  Consultations:  None  Discharge Exam: Filed Vitals:   06/29/13 1135  BP: 97/60  Pulse: 85  Temp: 98.5 F (36.9 C)  Resp: 13   General: No acute respiratory distress  Lungs: Clear to auscultation bilaterally without wheezes or crackles, RA  Cardiovascular: Regular rate and rhythm without murmur gallop or rub normal S1 and S2, no peripheral edema or JVD  Abdomen: Nontender, nondistended, soft, bowel sounds positive, no rebound, no ascites, no appreciable mass   Musculoskeletal: No significant cyanosis, clubbing of bilateral lower extremities-pain reproducible on palpation over left hip flexor and over left flank and up towards left scapula  Neurological: Alert and oriented x 3, moves all extremities x 4 without focal neurological deficits, CN 2-12 intact   Discharge Instructions      Discharge Orders   Future Orders Complete By Expires   Call MD for:  difficulty breathing, headache or visual disturbances  As directed    Call MD for:  extreme fatigue  As directed    Call MD for:  persistant dizziness or light-headedness  As directed    Call MD for:  severe uncontrolled pain  As directed    Diet - low sodium heart healthy  As directed    Discharge instructions  As directed    Comments:     No NSAID's: Ibuprofen,Mobic, Aleve, Naprosyn, full dose Aspirin, etc- ask pharmacist if not sure- OK to use Tylenol/Acetaminophen   Increase activity slowly  As directed        Medication List    STOP taking these medications       lisinopril-hydrochlorothiazide 20-12.5 MG per tablet  Commonly known as:  PRINZIDE,ZESTORETIC     meloxicam 15 MG tablet  Commonly known as:  MOBIC     traMADol 50 MG tablet  Commonly known as:  ULTRAM      TAKE these medications       baclofen 10 MG tablet  Commonly known as:  LIORESAL  Take 1 tablet (10 mg total) by mouth 3 (three) times daily.     DEPO-ESTRADIOL IM  Inject 1 application into the muscle every 3 (three) months.     DSS 100 MG Caps  Take 100 mg by mouth 2 (two) times daily.     ICY HOT EX  Apply 1 application topically 2 (two) times daily as needed. por pain/spasms     predniSONE 20 MG tablet  Commonly known as:  DELTASONE  Take 20 mg by mouth 3 (three) times daily.     ranitidine 150 MG tablet  Commonly known as:  ZANTAC  Take 1 tablet (150 mg total) by mouth 2 (two) times daily.     thyroid 30 MG tablet  Commonly known as:  ARMOUR  Take 30 mg by mouth daily before breakfast.        Allergies  Allergen Reactions  . Morphine And Related Nausea And Vomiting   Follow-up Information   Follow up with [facility]    . (Please call the Clinic after discharge if they have not contacted you with an appointment)    Contact information:   [address] [place] [address]        The results of significant diagnostics from this hospitalization (including imaging, microbiology, ancillary and laboratory) are listed below for reference.    Significant Diagnostic Studies: Mr Lumbar Spine Wo Contrast  06/27/2013   CLINICAL DATA:  Low back pain. Fall in shower.  Left radiculopathy.  EXAM: MRI LUMBAR SPINE WITHOUT CONTRAST  TECHNIQUE: Multiplanar, multisequence MR imaging was performed. No intravenous contrast was administered.  COMPARISON:  None.  FINDINGS: Unusual appearance of the visualize uterus with endometrium at 1.5 cm anterior-posterior, with several small submucosal fibroids measuring up to 9 mm in short axis, and with heterogeneous fundal signal intensity on T2 weighted images. The entirety of the uterus is not imaged.  There is straightening of the normal lumbar lordosis. The lowest lumbar type non-rib-bearing vertebra is labeled as L5. The conus medullaris appears normal. Conus level: L1.  There is 3 mm degenerative anterolisthesis at L4-5 with mild loss of intervertebral disc height and disc desiccation.  Despite efforts by the technologist and patient, motion artifact is present on today's exam and could not be eliminated. This reduces exam sensitivity and specificity. Degenerative facet arthropathy noted on the right at L4-5 with subcortical cysts and low level subcortical edema.  Additional findings at individual levels are as follows:  L1-2: Unremarkable.  L2-3: Unremarkable.  L3-4: Unremarkable.  L4-5: Mild left and borderline right subarticular lateral recess stenosis due to left paracentral disc protrusion and underlying disc bulge.  Right facet arthropathy noted.  L5-S1:  No impingement. Diffuse disc bulge.  IMPRESSION: 1. Mild left eccentric impingement at L4-5 due to disc protrusion and underlying disc bulge. 2. Abnormal appearance of the uterus, with thickened endometrium, suspected submucosal fibroids, and fundal heterogeneity a in the myometrium which may be due to adenomyosis or nearly isointense fibroid. Dedicated pelvic sonography is recommended for further characterization, particularly if the patient is postmenopausal.   Electronically Signed   By: Herbie Baltimore   On: 06/27/2013 14:32   US Renal  06/28/2013   *RADIOLOGY REPORT*  Clinical Data: Increased creatinine.  RENAL/URINARY TRACT ULTRASOUND COMPLETE  Comparison:  None.  Findings:  Right Kidney:  10 cm length.  No hydronephrosis or focal abnormality.  Normal echogenicity.  Left Kidney:  10 cm in length.  No hydronephrosis or focal abnormality.  Normal echogenicity.  Bladder:  117 ml pre void volume.  No wall thickening or filling defect.  IMPRESSION: Negative renal ultrasound.  No hydronephrosis.   Original Report Authenticated By: Tiburcio Pea    Microbiology: Recent Results (from the past 240 hour(s))  MRSA PCR SCREENING     Status: None   Collection Time    06/27/13 10:32 PM      Result Value Range Status   MRSA by PCR NEGATIVE  NEGATIVE Final   Comment:            The GeneXpert MRSA Assay (FDA     approved for NASAL specimens     only), is one component of a     comprehensive MRSA colonization     surveillance program. It is not     intended to diagnose MRSA     infection nor to guide or     monitor treatment for     MRSA infections.     Labs: Basic Metabolic Panel:  Recent Labs Lab 06/27/13 1008 06/27/13 1009 06/27/13 2330 06/28/13 0510 06/29/13 0547  NA 136  --  140 144 143  K 2.8*  --  4.0 4.0 3.8  CL 97  --  108 115* 116*  CO2 22  --  20 21 19   GLUCOSE 133*  --  84 86 83  BUN 36*  --  29* 23 9  CREATININE 2.04*  --  1.55* 1.42*  1.09  CALCIUM 10.5  --  9.1 9.1 8.8  MG  --  2.7*  --   --   --    Liver Function Tests:  Recent Labs Lab 06/28/13 0510 06/29/13 0547  AST 15 14  ALT 16 13  ALKPHOS 41 37*  BILITOT 0.5 0.5  PROT 5.2* 4.9*  ALBUMIN 3.1* 2.8*   CBC:  Recent Labs Lab 06/27/13 1008 06/28/13 0510 06/29/13 0547  WBC 3.4* 3.2* 3.0*  NEUTROABS  --  1.3* 0.9*  HGB 13.4 10.0* 9.9*  HCT 37.7 28.9* 29.4*  MCV 82.9 84.8 85.7  PLT 218 165 162   CBG:  Recent Labs Lab 06/27/13 1127  GLUCAP 91   Signed:  ELLIS,ALLISON L. ANP [facility] 06/29/2013, 12:31 PM  I have personally examined this patient and reviewed the entire database. I have reviewed the above note, made any necessary editorial changes, and agree with its content.  Lonia Blood, MD [facility]

## 2013-06-29 NOTE — Evaluation (Addendum)
Physical Therapy Evaluation Patient Details Name: Julie Miranda MRN: [id number] DOB: 04-28-1961 Today's Date: 06/29/2013 Time: 7829-5621 PT Time Calculation (min): 24 min  PT Assessment / Plan / Recommendation History of Present Illness  52 y.o. BF PMHx hypothyroidism, HTN, left shoulder arthritis, S/P Gastric Bypass, Pt states that she has been having dizziness that has progressed over the last couple of months and now constant. Pt has had several falls related to dizziness. Yesterday patient passed out and fell and struck lower back on sink. Pt is pale and hypotensive at triage. Patient is on Depakote shots has not had a period for for quite some time. Patient also states she takes medicine for high blood pressure and takes an NSAID for headache and back pain. Upon further questioning patient admits to taking doses of NSAIDs (Goody's powder, BC powder). States was given tramadol for migraines, and takes unknown water pill. States recently she doesn't see her PCP regularly is lack of insurance.   Pt's fall resulting in left sided L4-5 disc bulge.  Clinical Impression  Pt admitted with the above. Pt currently with functional limitations due to the deficits listed below (see PT Problem List). Pt currently limited overall mobility due to back pain.  Will continue to follow to further educate on HEP to prepare for safe d/c home.     PT Assessment  Patient needs continued PT services    Follow Up Recommendations  No PT follow up    Equipment Recommendations  None recommended by PT    Frequency Min 3X/week    Precautions / Restrictions     Pertinent Vitals/Pain C/o left side back radiating to knee but does not rate when asked; pt premedicated      Mobility  Bed Mobility Bed Mobility: Not assessed (Pt sitting on EOB when entering) Transfers Transfers: Sit to Stand;Stand to Sit Sit to Stand: 5: Supervision;From bed Stand to Sit: 5: Supervision;To bed Details for Transfer Assistance:  Supervision for safety Ambulation/Gait Ambulation/Gait Assistance: 5: Supervision Ambulation Distance (Feet): 120 Feet Assistive device: None;Other (Comment) (occasional Hand rail in hallway) Ambulation/Gait Assistance Details: Supervision for safety.  Pt with slight flexed posture leaning to left side. Gait Pattern: Step-through pattern;Decreased stride length;Shuffle;Antalgic;Trunk flexed Gait velocity: decreased due to pain Stairs: Yes Stairs Assistance: 4: Min assist Stairs Assistance Details (indicate cue type and reason): (A) to maintain balance with HHA and use of rail Stair Management Technique: One rail Right;Forwards Number of Stairs: 12    Exercises     PT Diagnosis: Difficulty walking;Acute pain  PT Problem List: Pain;Decreased knowledge of use of DME;Decreased strength;Decreased activity tolerance PT Treatment Interventions: DME instruction;Gait training;Stair training;Functional mobility training;Therapeutic activities;Therapeutic exercise;Patient/family education     PT Goals(Current goals can be found in the care plan section) Acute Rehab PT Goals Patient Stated Goal: To decrease pain so I can start walking better PT Goal Formulation: With patient Time For Goal Achievement: 07/06/13  Visit Information  Last PT Received On: 06/29/13 Assistance Needed: +1 History of Present Illness: 52 y.o. BF PMHx hypothyroidism, HTN, left shoulder arthritis, S/P Gastric Bypass, Pt states that she has been having dizziness that has progressed over the last couple of months and now constant. Pt has had several falls related to dizziness. Yesterday patient passed out and fell and struck lower back on sink. Pt is pale and hypotensive at triage. Patient is on Depakote shots has not had a period for for quite some time. Patient also states she takes medicine for high blood pressure and  takes an NSAID for headache and back pain. Upon further questioning patient admits to taking doses of NSAIDs  (Goody's powder, BC powder). States was given tramadol for migraines, and takes unknown water pill. States recently she doesn't see her PCP regularly is lack of insurance.   Pt's fall resulting in left sided L4-5 disc bulge.       Prior Functioning  Home Living Family/patient expects to be discharged to:: Private residence Living Arrangements: Parent;Other relatives Available Help at Discharge: Family Type of Home: House Home Access: Stairs to enter Secretary/administrator of Steps: 3 Entrance Stairs-Rails: Right Home Layout: Two level Alternate Level Stairs-Number of Steps: 12 Alternate Level Stairs-Rails: Right Home Equipment: Cane - single point Prior Function Level of Independence: Independent Communication Communication: No difficulties Dominant Hand: Right    Cognition  Cognition Arousal/Alertness: Awake/alert Behavior During Therapy: WFL for tasks assessed/performed Overall Cognitive Status: Within Functional Limits for tasks assessed    Extremity/Trunk Assessment Lower Extremity Assessment Lower Extremity Assessment: LLE deficits/detail LLE: Unable to fully assess due to pain (difficult to assess due to pain)   Balance    End of Session PT - End of Session Activity Tolerance: Patient tolerated treatment well Patient left: in bed;with call bell/phone within reach;with family/visitor present (sitting EOB) Nurse Communication: Mobility status  GP     Niklas Chretien 06/29/2013, 2:53 PM  Jake Shark, PT DPT [phone]

## 2013-06-29 NOTE — Progress Notes (Signed)
D/c instructions reviewed and given to patient, and patient verbalized understanding. Pt d/c home with her mother.

## 2013-06-29 NOTE — Progress Notes (Signed)
Physical Therapy Treatment Patient Details Name: Julie Miranda MRN: [id number] DOB: 25-Feb-1961 Today's Date: 06/29/2013 Time: 1478-2956 PT Time Calculation (min): 37 min  PT Assessment / Plan / Recommendation  History of Present Illness 52 y.o. BF PMHx hypothyroidism, HTN, left shoulder arthritis, S/P Gastric Bypass, Pt states that she has been having dizziness that has progressed over the last couple of months and now constant. Pt has had several falls related to dizziness. Yesterday patient passed out and fell and struck lower back on sink. Pt is pale and hypotensive at triage. Patient is on Depakote shots has not had a period for for quite some time. Patient also states she takes medicine for high blood pressure and takes an NSAID for headache and back pain. Upon further questioning patient admits to taking doses of NSAIDs (Goody's powder, BC powder). States was given tramadol for migraines, and takes unknown water pill. States recently she doesn't see her PCP regularly is lack of insurance.   Pt's fall resulting in left sided L4-5 disc bulge.   PT Comments   I was asked by the evaluating therapist to provide and HEP for pt's left lateral bulging disc as she has no insurance and would not be able to afford OP PT f/u.  Reviewed and provided handout for HEP and education (see in paper chart).  Pt and mother verbalized understanding.  If she is still here Monday, please review HEP with her again.    Follow Up Recommendations  No PT follow up     Does the patient have the potential to tolerate intense rehabilitation    NA  Barriers to Discharge   None      Equipment Recommendations  None recommended by PT    Recommendations for Other Services   None  Frequency Min 3X/week   Progress towards PT Goals Progress towards PT goals: Progressing toward goals  Plan Current plan remains appropriate    Precautions / Restrictions Precautions Precautions: Back   Pertinent Vitals/Pain See vitals flow  sheet.    Mobility  Bed Mobility Bed Mobility: Rolling Right;Rolling Left;Supine to Sit;Sit to Supine Rolling Right: 6: Modified independent (Device/Increase time) Rolling Left: 6: Modified independent (Device/Increase time) Supine to Sit: 6: Modified independent (Device/Increase time) Sit to Supine: 6: Modified independent (Device/Increase time) Details for Bed Mobility Assistance: slowly and cautiously Transfers Transfers: Sit to Stand;Stand to Sit Sit to Stand: 5: Supervision;From bed Stand to Sit: 5: Supervision;To bed Details for Transfer Assistance: supervision for safety, moves slowly with heavy reliance on hands for support.   Ambulation/Gait Ambulation/Gait Assistance: 5: Supervision Ambulation Distance (Feet): 120 Feet Assistive device: None;Other (Comment) (occasional Hand rail in hallway) Ambulation/Gait Assistance Details: Supervision for safety.  Pt with slight flexed posture leaning to left side. Gait Pattern: Step-through pattern;Decreased stride length;Shuffle;Antalgic;Trunk flexed Gait velocity: decreased due to pain Stairs: Yes Stairs Assistance: 4: Min assist Stairs Assistance Details (indicate cue type and reason): (A) to maintain balance with HHA and use of rail Stair Management Technique: One rail Right;Forwards Number of Stairs: 12    Exercises Other Exercises Other Exercises: reviewed with pt and mother back HEP to help with her bulging disc including TA activation (ab sets), bridges, prone props, sleeping and postural education and standing extension exercises (see handout in paper chart).     PT Diagnosis: Difficulty walking;Acute pain  PT Problem List: Pain;Decreased knowledge of use of DME;Decreased strength;Decreased activity tolerance PT Treatment Interventions: DME instruction;Gait training;Stair training;Functional mobility training;Therapeutic activities;Therapeutic exercise;Patient/family education   PT Goals (current  goals can now be found in  the care plan section) Acute Rehab PT Goals Patient Stated Goal: decrease pain PT Goal Formulation: With patient Time For Goal Achievement: 07/06/13  Visit Information  Last PT Received On: 06/29/13 Assistance Needed: +1 History of Present Illness: 52 y.o. BF PMHx hypothyroidism, HTN, left shoulder arthritis, S/P Gastric Bypass, Pt states that she has been having dizziness that has progressed over the last couple of months and now constant. Pt has had several falls related to dizziness. Yesterday patient passed out and fell and struck lower back on sink. Pt is pale and hypotensive at triage. Patient is on Depakote shots has not had a period for for quite some time. Patient also states she takes medicine for high blood pressure and takes an NSAID for headache and back pain. Upon further questioning patient admits to taking doses of NSAIDs (Goody's powder, BC powder). States was given tramadol for migraines, and takes unknown water pill. States recently she doesn't see her PCP regularly is lack of insurance.   Pt's fall resulting in left sided L4-5 disc bulge.    Subjective Data  Subjective: Pt reports that this is the first time she has ever had back pain.   Patient Stated Goal: decrease pain   Cognition  Cognition Arousal/Alertness: Awake/alert Behavior During Therapy: WFL for tasks assessed/performed Overall Cognitive Status: Within Functional Limits for tasks assessed       End of Session PT - End of Session Activity Tolerance: Patient limited by pain Patient left: in bed;with call bell/phone within reach;with family/visitor present Nurse Communication: Mobility status     Lurena Joiner B. Shyhiem Beeney, PT, DPT [phone]   06/29/2013, 3:18 PM

## 2013-07-17 ENCOUNTER — Encounter: Payer: Self-pay | Admitting: Internal Medicine

## 2013-07-17 ENCOUNTER — Ambulatory Visit: Payer: Self-pay | Attending: Internal Medicine

## 2013-07-17 ENCOUNTER — Ambulatory Visit: Payer: No Typology Code available for payment source | Attending: Internal Medicine | Admitting: Internal Medicine

## 2013-07-17 VITALS — BP 152/92 | HR 65 | Temp 98.7°F | Resp 18 | Ht 65.0 in | Wt 175.0 lb

## 2013-07-17 DIAGNOSIS — M545 Low back pain, unspecified: Secondary | ICD-10-CM

## 2013-07-17 DIAGNOSIS — I10 Essential (primary) hypertension: Secondary | ICD-10-CM

## 2013-07-17 DIAGNOSIS — R51 Headache: Secondary | ICD-10-CM

## 2013-07-17 DIAGNOSIS — M79609 Pain in unspecified limb: Secondary | ICD-10-CM | POA: Insufficient documentation

## 2013-07-17 DIAGNOSIS — D649 Anemia, unspecified: Secondary | ICD-10-CM

## 2013-07-17 DIAGNOSIS — G8911 Acute pain due to trauma: Secondary | ICD-10-CM

## 2013-07-17 MED ORDER — THYROID 30 MG PO TABS: 30.0000 mg | ORAL_TABLET | Freq: Every day | ORAL | Status: DC

## 2013-07-17 MED ORDER — ONDANSETRON HCL 4 MG PO TABS: 4.0000 mg | ORAL_TABLET | Freq: Three times a day (TID) | ORAL | Status: DC | PRN

## 2013-07-17 NOTE — Progress Notes (Signed)
Patient Demographics  Julie Miranda, is a 52 y.o. female  [id number]  [id number]  DOB - July 12, 1961  Chief Complaint  Patient presents with  . Establish Care  . Leg Pain        Subjective:   Julie Miranda today is here to establish primary care. She was recently discharged from the hospital after being admitted for dehydration, acute renal failure and a syncopal episode. Since discharge she is being doing well, her gait has improved, she continues to have intermittent headaches.  Patient has a long-standing history of intermittent left chronic back pain, and continues to complain of intermittent back pain. She has been seeing physical therapy and claims to have good relief. Pain is controlled with Tylenol.  She continues to have intermittent headaches since discharge, no history of phonophobia or photophobia. Headaches at times associated with nausea  Currently patient has no other complaints. Patient has also has, No chest pain, No abdominal pain, No new weakness tingling or numbness, No Cough or SOB.   Objective:    Filed Vitals:   07/17/13 1747  BP: 152/92  Pulse: 65  Temp: 98.7 F (37.1 C)  TempSrc: Oral  Resp: 18  Height: 5\' 5"  (1.651 m)  Weight: 175 lb (79.379 kg)  SpO2: 100%     ALLERGIES:   Allergies  Allergen Reactions  . Morphine And Related Nausea And Vomiting    PAST MEDICAL HISTORY: Past Medical History  Diagnosis Date  . Thyroid disease   . Hypertension     PAST SURGICAL HISTORY: Past Surgical History  Procedure Laterality Date  . Gastric bypass      FAMILY HISTORY: History reviewed. No pertinent family history.  MEDICATIONS AT HOME: Prior to Admission medications   Medication Sig Start Date End Date Taking? Authorizing Provider  Estradiol Cypionate (DEPO-ESTRADIOL IM) Inject 1 application into the muscle every 3 (three) months.   Yes Historical Provider, MD  ranitidine (ZANTAC) 150 MG tablet Take 1 tablet (150 mg total) by  mouth 2 (two) times daily. 06/29/13  Yes Russella Dar, NP  thyroid (ARMOUR) 30 MG tablet Take 30 mg by mouth daily before breakfast.   Yes Historical Provider, MD  baclofen (LIORESAL) 10 MG tablet Take 1 tablet (10 mg total) by mouth 3 (three) times daily. 06/29/13   Russella Dar, NP  docusate sodium 100 MG CAPS Take 100 mg by mouth 2 (two) times daily. 06/29/13   Russella Dar, NP  Menthol, Topical Analgesic, (ICY HOT EX) Apply 1 application topically 2 (two) times daily as needed. por pain/spasms    Historical Provider, MD  ondansetron (ZOFRAN) 4 MG tablet Take 1 tablet (4 mg total) by mouth every 8 (eight) hours as needed for nausea. 07/17/13   Yoshiaki Kreuser Levora Dredge, MD    SOCIAL HISTORY:   reports that she has never smoked. She does not have any smokeless tobacco history on file. She reports that she does not drink alcohol or use illicit drugs.  REVIEW OF SYSTEMS:  Constitutional:   No   Fevers, chills, fatigue.  HEENT:    No Sore throat,   Cardio-vascular: No chest pain,  Orthopnea, swelling in lower extremities, anasarca, palpitations  GI:  No abdominal pain, nausea, vomiting, diarrhea  Resp: No shortness of breath,  No coughing up of blood.No cough.No wheezing.  Skin:  no rash or lesions.  GU:  no dysuria, change in color of urine, no urgency or frequency.  No flank pain.  Musculoskeletal: No joint pain or  swelling.  No decreased range of motion.  No back pain.  Psych: No change in mood or affect. No depression or anxiety.  No memory loss.   Exam  General appearance :Awake, alert, not in any distress. Speech Clear. Not toxic Looking HEENT: Atraumatic and Normocephalic, pupils equally reactive to light and accomodation Neck: supple, no JVD. No cervical lymphadenopathy.  Chest:Good air entry bilaterally, no added sounds  CVS: S1 S2 regular, no murmurs.  Abdomen: Bowel sounds present, Non tender and not distended with no gaurding, rigidity or rebound. Extremities: B/L  Lower Ext shows no edema, both legs are warm to touch Neurology: Awake alert, and oriented X 3, CN II-XII intact, Non focal Skin:No Rash Wounds:N/A    Data Review   CBC No results found for this basename: WBC, HGB, HCT, PLT, MCV, MCH, MCHC, RDW, NEUTRABS, LYMPHSABS, MONOABS, EOSABS, BASOSABS, BANDABS, BANDSABD,  in the last 168 hours  Chemistries   No results found for this basename: NA, K, CL, CO2, GLUCOSE, BUN, CREATININE, GFRCGP, CALCIUM, MG, AST, ALT, ALKPHOS, BILITOT,  in the last 168 hours ------------------------------------------------------------------------------------------------------------------ No results found for this basename: HGBA1C,  in the last 72 hours ------------------------------------------------------------------------------------------------------------------ No results found for this basename: CHOL, HDL, LDLCALC, TRIG, CHOLHDL, LDLDIRECT,  in the last 72 hours ------------------------------------------------------------------------------------------------------------------ No results found for this basename: TSH, T4TOTAL, FREET3, T3FREE, THYROIDAB,  in the last 72 hours ------------------------------------------------------------------------------------------------------------------ No results found for this basename: VITAMINB12, FOLATE, FERRITIN, TIBC, IRON, RETICCTPCT,  in the last 72 hours  Coagulation profile  No results found for this basename: INR, PROTIME,  in the last 168 hours    Assessment & Plan   Acute renal failure - Resolved - Will monitor electrolytes periodically - Would avoid ACE inhibitors  Headaches -? Etiology - Check CT head - Continue to follow, use Tylenol when necessary  Hypertension - Patient was stopped off all antihypertensive medications upon hospital discharge, blood pressure seems to be slowly trending up. I will continue to monitor off BP meds and reassess at next visit. If BP continues to be elevated in the 150s to  160s range she will need to be restarted on low dose amlodipine 2.5 mg daily. Would want to avoid aggressive control, as review of her last admission notes suggest that hypotension from diuretics and ACE inhibitors played a big role in acute renal failure.  Chronic back pain - MRI of the lumbosacral spine revealed - As needed Tylenol - Refer sports medicine  Hypothyroidism - Will check TSH prior to next visit   Health Maintenance -Colonoscopy: Will refer to GI -Pap Smear: Will refer to GYN -Mammogram: Will order   Follow up in 3 weeks  The patient was given clear instructions to go to ER or return to medical center if symptoms don't improve, worsen or new problems develop. The patient verbalized understanding. The patient was told to call to get lab results if they haven't heard anything in the next week.

## 2013-07-17 NOTE — Progress Notes (Signed)
PT HERE TO ESTABLISH CARE THYROID, LEG PAIN AND S/P HYPOTENSION  WITH HOSPITALIZATION 2 WEEKS AGO

## 2013-07-18 ENCOUNTER — Telehealth: Payer: Self-pay | Admitting: Emergency Medicine

## 2013-07-26 ENCOUNTER — Ambulatory Visit (HOSPITAL_COMMUNITY)
Admission: RE | Admit: 2013-07-26 | Discharge: 2013-07-26 | Disposition: A | Discharge status: Home / Self Care | Payer: Self-pay | Source: Ambulatory Visit | Attending: Internal Medicine | Admitting: Internal Medicine

## 2013-07-26 DIAGNOSIS — R51 Headache: Secondary | ICD-10-CM | POA: Insufficient documentation

## 2013-07-26 DIAGNOSIS — H539 Unspecified visual disturbance: Secondary | ICD-10-CM | POA: Insufficient documentation

## 2013-07-26 DIAGNOSIS — R269 Unspecified abnormalities of gait and mobility: Secondary | ICD-10-CM | POA: Insufficient documentation

## 2013-07-26 DIAGNOSIS — R42 Dizziness and giddiness: Secondary | ICD-10-CM | POA: Insufficient documentation

## 2013-08-02 ENCOUNTER — Ambulatory Visit: Payer: Self-pay | Admitting: Sports Medicine

## 2013-08-07 ENCOUNTER — Ambulatory Visit: Payer: Self-pay

## 2013-08-15 ENCOUNTER — Ambulatory Visit (INDEPENDENT_AMBULATORY_CARE_PROVIDER_SITE_OTHER): Payer: No Typology Code available for payment source | Admitting: Sports Medicine

## 2013-08-15 ENCOUNTER — Ambulatory Visit
Admission: RE | Admit: 2013-08-15 | Discharge: 2013-08-15 | Disposition: A | Discharge status: Home / Self Care | Payer: No Typology Code available for payment source | Source: Ambulatory Visit | Attending: Sports Medicine | Admitting: Sports Medicine

## 2013-08-15 ENCOUNTER — Encounter: Payer: Self-pay | Admitting: Sports Medicine

## 2013-08-15 VITALS — BP 148/97 | HR 70 | Ht 65.0 in | Wt 175.0 lb

## 2013-08-15 DIAGNOSIS — D219 Benign neoplasm of connective and other soft tissue, unspecified: Secondary | ICD-10-CM

## 2013-08-15 DIAGNOSIS — M545 Low back pain, unspecified: Secondary | ICD-10-CM

## 2013-08-15 DIAGNOSIS — D259 Leiomyoma of uterus, unspecified: Secondary | ICD-10-CM

## 2013-08-15 MED ORDER — TRAMADOL HCL 50 MG PO TABS: 50.0000 mg | ORAL_TABLET | Freq: Three times a day (TID) | ORAL | Status: DC | PRN

## 2013-08-15 NOTE — Progress Notes (Signed)
  Subjective:    Patient ID: Julie Miranda, female    DOB: 03-21-1961, 52 y.o.   MRN: [id number]  HPI Comments: 52 year old female with a history of chronic lumbago presents with 1-1/2 month history of increasing acute on chronic lower back pain. Julie Miranda had a fall in the shower approximately 1.5 months ago, for which she was hospitalized and evaluated for approximately 3 days. During that hospitalization, an MRI was obtained which showed L4-L5 disc herniation with mild left greater than right impingement. Since that time, she has had worsening back pain, worse with lifting and straining, and not relieved by aspirin which is from the pain medication. She has been doing some back stretches which were provided for her by physical therapy with some improvement in her pain. She denies fevers, loss of bowel, bladder, perianal numbness. Denies weakness or numbness in her lower extremity. States the symptoms are right greater than left with her entire right leg being affected and sometimes her left quadriceps and buttocks being affected.  Back Pain      Review of Systems  Constitutional: Positive for activity change.  Musculoskeletal: Positive for arthralgias, back pain and gait problem. Negative for neck pain and neck stiffness.  All other systems reviewed and are negative.       Objective:   Physical Exam  Constitutional: She appears well-developed and well-nourished. No distress.  Musculoskeletal:  Tenderness in the lower lumbar spine region extending in the bilateral paraspinal musculature. Tenderness extending over the left buttocks and right buttocks. Negative straight leg test on the right. Positive straight leg test on the left. 5 out of 5 strength in bilateral hip abduction as well as hip extension, hip flexion, knee extension, knee flexion, plantar flexion, dorsiflexion. 3+ patellar and Achilles reflexes bilaterally. No clonus. Normal sensation throughout the lower extremities.  Skin:  She is not diaphoretic.          Assessment & Plan:   52 year old female with acute on chronic lower back pain with L4-L5 lumbar disc herniation and lumbago.  #1. L4-L5 disc herniation with lumbago -Symptoms are significantly improved with back strengthening exercises which have been provided by physical therapy. Patient has been taking aspirin for pain relief which is not advisable, especially based on her history of ulcers. Will provide with tramadol instead for pain relief. Discussed options as well as efficacy of potential epidural steroid injections, and due to improvement in symptoms as well as risk benefit, feel that this was not indicated at this time. Based on relief of pain with tramadol as well as progression through strengthening exercises, will reassess at six-week followup.  #2. Abnormal uterine enhancement seen on lumbar MRI -Further evaluate with pelvic ultrasound -Will call with results when available

## 2013-08-16 ENCOUNTER — Telehealth: Payer: Self-pay | Admitting: *Deleted

## 2013-08-16 NOTE — Telephone Encounter (Signed)
Referral sent to [facility] at [facility] in [place] as they accept the orange card.

## 2013-08-16 NOTE — Telephone Encounter (Signed)
I spoke with the patient on the phone today after reviewing the results of her transvaginal ultrasound of the pelvis. There is a suspected endometrial polyp or mass with a moderate amount of fluid possibly indicating some obstruction to the endocervical canal. A sonohysterogram or endometrial biopsy was recommended by the reading radiologist. Therefore, I will make a referral to gynecology for her.

## 2013-08-16 NOTE — Telephone Encounter (Signed)
Message copied by Mora Bellman on Thu Aug 16, 2013 11:26 AM ------      Message from: Reino Bellis R      Created: Thu Aug 16, 2013  8:42 AM      Regarding: Gyn referral       Please refer to gynecology for evaluation      Abnormal pelvic ultrasound            ----- Message -----         From: Rad Results In Interface         Sent: 08/15/2013   1:13 PM           To: Ralene Cork, DO                   ------

## 2013-08-22 NOTE — Telephone Encounter (Signed)
Scheduled at [facility] on 10/04/13 at 1:15pm.

## 2013-08-23 ENCOUNTER — Ambulatory Visit (HOSPITAL_COMMUNITY)
Admission: RE | Admit: 2013-08-23 | Discharge: 2013-08-23 | Disposition: A | Discharge status: Home / Self Care | Payer: No Typology Code available for payment source | Source: Ambulatory Visit | Attending: Internal Medicine | Admitting: Internal Medicine

## 2013-08-23 DIAGNOSIS — D649 Anemia, unspecified: Secondary | ICD-10-CM

## 2013-08-23 DIAGNOSIS — G8911 Acute pain due to trauma: Secondary | ICD-10-CM

## 2013-08-23 DIAGNOSIS — Z1231 Encounter for screening mammogram for malignant neoplasm of breast: Secondary | ICD-10-CM | POA: Insufficient documentation

## 2013-08-23 DIAGNOSIS — M545 Low back pain, unspecified: Secondary | ICD-10-CM

## 2013-08-23 DIAGNOSIS — I10 Essential (primary) hypertension: Secondary | ICD-10-CM

## 2013-08-23 DIAGNOSIS — R51 Headache: Secondary | ICD-10-CM

## 2013-08-24 ENCOUNTER — Telehealth: Payer: Self-pay

## 2013-08-24 DIAGNOSIS — Z0000 Encounter for general adult medical examination without abnormal findings: Secondary | ICD-10-CM

## 2013-08-24 NOTE — Telephone Encounter (Signed)
Message copied by Lestine Mount on Fri Aug 24, 2013  4:23 PM ------      Message from: Maretta Bees      Created: Fri Aug 24, 2013  4:12 PM       Please have patient follow up in the next 2-3 weeks-re-evaluation of headaches and to go over her radiology reports ------

## 2013-08-24 NOTE — Telephone Encounter (Signed)
Message copied by Lestine Mount on Fri Aug 24, 2013  4:19 PM ------      Message from: Maretta Bees      Created: Fri Aug 24, 2013  4:12 PM       Please have patient follow up in the next 2-3 weeks-re-evaluation of headaches and to go over her radiology reports ------

## 2013-08-24 NOTE — Progress Notes (Signed)
Quick Note:  Please have patient follow up in the next 2-3 weeks-re-evaluation of headaches and to go over her radiology reports ______

## 2013-08-30 ENCOUNTER — Other Ambulatory Visit: Payer: Self-pay

## 2013-09-05 ENCOUNTER — Ambulatory Visit: Payer: No Typology Code available for payment source | Attending: Internal Medicine | Admitting: Internal Medicine

## 2013-09-05 VITALS — BP 130/96 | HR 70 | Temp 98.4°F | Resp 16 | Ht 65.0 in | Wt 166.8 lb

## 2013-09-05 DIAGNOSIS — E039 Hypothyroidism, unspecified: Secondary | ICD-10-CM

## 2013-09-05 LAB — COMPLETE METABOLIC PANEL WITH GFR
ALT: 10 U/L (ref 0–35)
AST: 16 U/L (ref 0–37)
Creat: 0.96 mg/dL (ref 0.50–1.10)
GFR, Est African American: 79 mL/min
Total Bilirubin: 0.6 mg/dL (ref 0.3–1.2)

## 2013-09-05 MED ORDER — MECLIZINE HCL 25 MG PO TABS: 25.0000 mg | ORAL_TABLET | Freq: Three times a day (TID) | ORAL | Status: DC | PRN

## 2013-09-05 MED ORDER — AMLODIPINE BESYLATE 10 MG PO TABS: 10.0000 mg | ORAL_TABLET | Freq: Every day | ORAL | Status: DC

## 2013-09-05 MED ORDER — APAP 325 MG PO TABS: 650.0000 mg | ORAL_TABLET | Freq: Two times a day (BID) | ORAL | Status: DC | PRN

## 2013-09-05 NOTE — Progress Notes (Unsigned)
Patient ID: Julie Miranda, female   DOB: May 21, 1961, 52 y.o.   MRN: [id number] Patient Demographics  Julie Miranda, is a 52 y.o. female  [id number]  [id number]  DOB - 21-Feb-1961  Chief Complaint  Patient presents with  . Follow-up        Subjective:   Julie Miranda with History of L spine disc prolapse seen the sports it is in physician with good symptom control, HTN, hypothyroidism, intermittent recurrent headaches, status post gastric bypass surgery, symptoms of vertigo here for routine followup, her active complaints intermittent headaches which are generalized, some intermittent leg cramps. Also complaining of early satiety and wants to see a GI physician for the same.  Denies any subjective complaints except as above, no active headache, no chest abdominal pain at this time, not short of breath. No focal weakness which is new.   Objective:    Patient Active Problem List   Diagnosis Date Noted  . Dehydration 06/28/2013  . Fibroids 06/28/2013  . Anemia, unspecified 06/28/2013  . Acute low back pain due to trauma 06/28/2013  . Unspecified hypothyroidism 06/27/2013  . Syncope and collapse 06/27/2013  . Arthritis 06/27/2013  . Acute renal failure 06/27/2013  . Essential hypertension, benign 06/27/2013  . 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 Chronic lower back pain 06/27/2013  . Hypokalemia 06/27/2013  . Hypotension, unspecified 06/27/2013     Filed Vitals:   09/05/13 0924  BP: 130/96  Pulse: 70  Temp: 98.4 F (36.9 C)  Resp: 16  Height: 5\' 5"  (1.651 m)  Weight: 166 lb 12.8 oz (75.66 kg)  SpO2: 100%     Exam   Awake Alert, Oriented X 3, No new F.N deficits, Normal affect [place].AT,PERRAL Supple Neck,No JVD, No cervical lymphadenopathy appriciated.  Symmetrical Chest wall movement, Good air movement bilaterally, CTAB RRR,No Gallops,Rubs or new Murmurs, No Parasternal Heave +ve B.Sounds, Abd Soft, Non tender, No organomegaly appriciated, No rebound  - guarding or rigidity. No Cyanosis, Clubbing or edema, No new Rash or bruise     Data Review   Lab Results  Component Value Date   WBC 3.0* 06/29/2013   HGB 9.9* 06/29/2013   HCT 29.4* 06/29/2013   MCV 85.7 06/29/2013   PLT 162 06/29/2013      Chemistry      Component Value Date/Time   NA 143 06/29/2013 0547   K 3.8 06/29/2013 0547   CL 116* 06/29/2013 0547   CO2 19 06/29/2013 0547   BUN 9 06/29/2013 0547   CREATININE 1.09 06/29/2013 0547      Component Value Date/Time   CALCIUM 8.8 06/29/2013 0547   ALKPHOS 37* 06/29/2013 0547   AST 14 06/29/2013 0547   ALT 13 06/29/2013 0547   BILITOT 0.5 06/29/2013 0547       No results found for this basename: HGBA1C    No results found for this basename: CHOL, HDL, LDLCALC, LDLDIRECT, TRIG, CHOLHDL    No results found for this basename: TSH    No results found for this basename: PSA      Prior to Admission medications   Medication Sig Start Date End Date Taking? Authorizing Provider  APAP (TYLENOL) 325 MG tablet Take 2 tablets (650 mg total) by mouth every 12 (twelve) hours as needed for headache. 09/05/13   Leroy Sea, MD  docusate sodium 100 MG CAPS Take 100 mg by mouth 2 (two) times daily. 06/29/13   Russella Dar, NP  Estradiol Cypionate (DEPO-ESTRADIOL IM) Inject 1 application into the  muscle every 3 (three) months.    Historical Provider, MD  Menthol, Topical Analgesic, (ICY HOT EX) Apply 1 application topically 2 (two) times daily as needed. por pain/spasms    Historical Provider, MD  ondansetron (ZOFRAN) 4 MG tablet Take 1 tablet (4 mg total) by mouth every 8 (eight) hours as needed for nausea. 07/17/13   Shanker Levora Dredge, MD  ranitidine (ZANTAC) 150 MG tablet Take 1 tablet (150 mg total) by mouth 2 (two) times daily. 06/29/13   Russella Dar, NP  thyroid (ARMOUR) 30 MG tablet Take 1 tablet (30 mg total) by mouth daily before breakfast. 07/17/13   Maretta Bees, MD  traMADol (ULTRAM) 50 MG tablet Take 1 tablet (50 mg total) by mouth  every 8 (eight) hours as needed for pain. 08/15/13   Ralene Cork, DO     Assessment & Plan    Intermittent headaches. Does not have any alarming symptoms, not typical for migraine, does not want to try Motrin, is already taking tramadol for back pain, will add Tylenol as needed, CT scan of the head done recently was unremarkable. Will refer to neurology.   Status post gastric bypass surgery. Complains of early satiety is new for her, will refer to GI for EGD. She is also due for colonoscopy.   Hypothyroidism. On thyroid supplementation we'll check baseline TSH.    Intermittent leg cramps. Will check BMP to rule out hypokalemia.   L-spine disc prolapse. Is stable on tramadol and exercises which she has been prescribed by the sports medicine physician. No lower extremity weakness. Symptoms are in good control per her.    Neutropenia noted on past blood work. Repeat CBC with differential.    Protein levels noted to be low on past blood work. Repeat CMP we'll also check a UA    Intermittent vertigo. Accompanied by ringing in ears, sensation of spinning, ear exam unremarkable, we'll give her when necessary Antivert this sounds like mild Mnire's disease.    HTN. Blood pressure is high we'll place her on Norvasc 10 mg daily.    Recent pelvic ultrasound showing questionable endometrial growth. Referred to OB.     Routine health maintenance.  Unremarkable Pap smear and mammogram few months ago   Leroy Sea M.D on 09/05/2013 at 9:47 AM

## 2013-09-05 NOTE — Progress Notes (Unsigned)
Patient here for results from recent test Ct and mamogram

## 2013-09-06 ENCOUNTER — Telehealth: Payer: Self-pay | Admitting: Emergency Medicine

## 2013-09-06 ENCOUNTER — Telehealth: Payer: Self-pay | Admitting: Internal Medicine

## 2013-09-06 LAB — URINALYSIS W MICROSCOPIC + REFLEX CULTURE
Casts: NONE SEEN
Glucose, UA: NEGATIVE mg/dL
Hgb urine dipstick: NEGATIVE
Squamous Epithelial / LPF: NONE SEEN
pH: 6 (ref 5.0–8.0)

## 2013-09-06 LAB — CBC WITH DIFFERENTIAL/PLATELET
Basophils Absolute: 0 10*3/uL (ref 0.0–0.1)
Eosinophils Absolute: 0.1 10*3/uL (ref 0.0–0.7)
Eosinophils Relative: 4 % (ref 0–5)
MCH: 27.4 pg (ref 26.0–34.0)
MCHC: 32.9 g/dL (ref 30.0–36.0)
MCV: 83.2 fL (ref 78.0–100.0)
Platelets: 308 10*3/uL (ref 150–400)
RDW: 14.8 % (ref 11.5–15.5)

## 2013-09-06 MED ORDER — THYROID 48.75 MG PO TABS: 48.7500 mg | ORAL_TABLET | Freq: Every day | ORAL | Status: DC

## 2013-09-06 MED ORDER — CIPROFLOXACIN HCL 500 MG PO TABS: 500.0000 mg | ORAL_TABLET | Freq: Two times a day (BID) | ORAL | Status: DC

## 2013-09-06 NOTE — Telephone Encounter (Signed)
Message copied by Darlis Loan on Thu Sep 06, 2013  1:07 PM ------      Message from: [facility], Bess Harvest K      Created: Thu Sep 06, 2013  9:10 AM       Please inform the patient  that her thyroid medication dose has been increased, she has UTI and has been placed on Cipro, prescriptions sent to her [facility] pharmacy, also referred to hematology for persistently low WBCs. ------

## 2013-09-06 NOTE — Progress Notes (Signed)
Quick Note:  Please inform the patient that her thyroid medication dose has been increased, she has UTI and has been placed on Cipro, prescriptions sent to her [facility] pharmacy, also referred to hematology for persistently low WBCs. ______

## 2013-09-06 NOTE — Progress Notes (Signed)
Pt given lab results and instructions to pick script Cipro from [facility] pharmacy.

## 2013-09-06 NOTE — Telephone Encounter (Signed)
S/W PT IN REF TO NEW PT APPT. ON 09/24/13@1 :30 REFERRING DR [facility] DX-PERSISTANT NEUTROPENIA MAILED NP PACKET

## 2013-09-07 ENCOUNTER — Telehealth: Payer: Self-pay | Admitting: Internal Medicine

## 2013-09-07 LAB — URINE CULTURE: Organism ID, Bacteria: NO GROWTH

## 2013-09-07 NOTE — Telephone Encounter (Signed)
C/D 09/07/13 for appt. 09/24/13

## 2013-09-12 ENCOUNTER — Telehealth: Payer: Self-pay | Admitting: Internal Medicine

## 2013-09-12 NOTE — Telephone Encounter (Signed)
C/D 09/12/13 for appt. 09/24/13

## 2013-09-21 ENCOUNTER — Other Ambulatory Visit: Payer: Self-pay | Admitting: *Deleted

## 2013-09-21 DIAGNOSIS — D709 Neutropenia, unspecified: Secondary | ICD-10-CM

## 2013-09-24 ENCOUNTER — Encounter: Payer: Self-pay | Admitting: Internal Medicine

## 2013-09-24 ENCOUNTER — Ambulatory Visit: Payer: No Typology Code available for payment source

## 2013-09-24 ENCOUNTER — Telehealth: Payer: Self-pay | Admitting: Internal Medicine

## 2013-09-24 ENCOUNTER — Ambulatory Visit (HOSPITAL_BASED_OUTPATIENT_CLINIC_OR_DEPARTMENT_OTHER): Payer: No Typology Code available for payment source | Admitting: Internal Medicine

## 2013-09-24 ENCOUNTER — Other Ambulatory Visit (HOSPITAL_BASED_OUTPATIENT_CLINIC_OR_DEPARTMENT_OTHER): Payer: No Typology Code available for payment source | Admitting: Lab

## 2013-09-24 ENCOUNTER — Ambulatory Visit (HOSPITAL_BASED_OUTPATIENT_CLINIC_OR_DEPARTMENT_OTHER): Payer: No Typology Code available for payment source | Admitting: Lab

## 2013-09-24 VITALS — BP 136/88 | HR 88 | Temp 98.4°F | Resp 18 | Ht 65.0 in | Wt 170.2 lb

## 2013-09-24 DIAGNOSIS — D72819 Decreased white blood cell count, unspecified: Secondary | ICD-10-CM

## 2013-09-24 DIAGNOSIS — R079 Chest pain, unspecified: Secondary | ICD-10-CM

## 2013-09-24 DIAGNOSIS — D709 Neutropenia, unspecified: Secondary | ICD-10-CM

## 2013-09-24 DIAGNOSIS — G4700 Insomnia, unspecified: Secondary | ICD-10-CM

## 2013-09-24 DIAGNOSIS — D61818 Other pancytopenia: Secondary | ICD-10-CM

## 2013-09-24 DIAGNOSIS — R5381 Other malaise: Secondary | ICD-10-CM

## 2013-09-24 LAB — CBC WITH DIFFERENTIAL/PLATELET
Eosinophils Absolute: 0.1 10*3/uL (ref 0.0–0.5)
HCT: 35.7 % (ref 34.8–46.6)
LYMPH%: 46 % (ref 14.0–49.7)
MONO#: 0.2 10*3/uL (ref 0.1–0.9)
NEUT#: 1.3 10*3/uL — ABNORMAL LOW (ref 1.5–6.5)
Platelets: 259 10*3/uL (ref 145–400)
RBC: 4.08 10*6/uL (ref 3.70–5.45)
WBC: 3 10*3/uL — ABNORMAL LOW (ref 3.9–10.3)
lymph#: 1.4 10*3/uL (ref 0.9–3.3)

## 2013-09-24 NOTE — Progress Notes (Signed)
[facility] [phone]   [phone]  CONSULT NOTE  REFERRING PHYSICIAN: Dr. Susa Raring  REASON FOR CONSULTATION:  52 years old African American female with persistent Leukocytopenia.  HPI Julie Miranda is a 52 y.o. female with a past medical history significant for anemia, arthritis, hypertension, chronic back pain, hypothyroidism, acute renal failure, anemia, fibroid uterus as well as history of dehydration. The patient was seen recently by Dr. Thedore Mins [facility] and CBC on 09/05/2013 showed low white blood count of 2.3 with absolute neutrophil count of 900. The patient had hemoglobin of 11.9, hematocrit 36.2% and platelets count of 308,000. She was referred to me for evaluation and recommendation regarding her leukocytopenia. The patient was found to have urinary tract infection at that time and was treated with a course of Cipro. She denied having any recent viral infection. She denied having any upper respiratory infection, sinusitis or pneumonia. She was hospitalized in September of 2014 secondary to dehydration and hypotension. Her CBC at that time showed also persistent leukocytopenia and neutropenia.  Today she continues to complain of increasing fatigue and been told in addition to leg cramps and poor appetite. She also has insomnia. The patient has occasional chest pain but no shortness breath, cough or hemoptysis. She denied having any significant weight loss or night sweats. Her family history is remarkable for mother with hypertension, diabetes mellitus and gout, father stroke and hypertension and an aunt with liver cancer. She has no family history of leukemia or other blood disease. The patient is single and has no children. She most to [place] 2 years ago from [place]. She used to work as a Medical illustrator. She is currently unemployed. She has no history of smoking, alcohol or drug  abuse. HPI  Past Medical History  Diagnosis Date  . Thyroid disease   . Hypertension     Past Surgical History  Procedure Laterality Date  . Gastric bypass      History reviewed. No pertinent family history.  Social History History  Substance Use Topics  . Smoking status: Never Smoker   . Smokeless tobacco: Not on file  . Alcohol Use: No    Allergies  Allergen Reactions  . Morphine And Related Nausea And Vomiting    Current Outpatient Prescriptions  Medication Sig Dispense Refill  . amLODipine (NORVASC) 10 MG tablet Take 1 tablet (10 mg total) by mouth daily.  90 tablet  3  . Estradiol Cypionate (DEPO-ESTRADIOL IM) Inject 1 application into the muscle every 3 (three) months.      . Menthol, Topical Analgesic, (ICY HOT EX) Apply 1 application topically 2 (two) times daily as needed. por pain/spasms      . ondansetron (ZOFRAN) 4 MG tablet Take 1 tablet (4 mg total) by mouth every 8 (eight) hours as needed for nausea.  20 tablet  0  . ranitidine (ZANTAC) 150 MG tablet Take 1 tablet (150 mg total) by mouth 2 (two) times daily.  30 tablet  12  . thyroid 48.75 MG TABS Take 48.75 mg by mouth daily before breakfast.  30 tablet  3  . APAP (TYLENOL) 325 MG tablet Take 2 tablets (650 mg total) by mouth every 12 (twelve) hours as needed for headache.  30 tablet  1  . docusate sodium 100 MG CAPS Take 100 mg by mouth 2 (two) times daily.  10 capsule  0  . meclizine (ANTIVERT) 25 MG tablet Take 1 tablet (  25 mg total) by mouth 3 (three) times daily as needed.  30 tablet  0  . traMADol (ULTRAM) 50 MG tablet Take 1 tablet (50 mg total) by mouth every 8 (eight) hours as needed for pain.  60 tablet  0   No current facility-administered medications for this visit.    Review of Systems  Constitutional: positive for fatigue Eyes: negative Ears, nose, mouth, throat, and face: negative Respiratory: positive for pleurisy/chest pain Cardiovascular: negative Gastrointestinal:  negative Genitourinary:negative Integument/breast: negative Hematologic/lymphatic: negative Musculoskeletal:positive for Lower extremity cramps Neurological: negative Behavioral/Psych: negative Endocrine: negative Allergic/Immunologic: negative  Physical Exam  ZOX:WRUEA, healthy, no distress, well nourished and well developed SKIN: skin color, texture, turgor are normal, no rashes or significant lesions HEAD: Normocephalic, No masses, lesions, tenderness or abnormalities EYES: normal, PERRLA EARS: External ears normal, Canals clear OROPHARYNX:no exudate, no erythema and lips, buccal mucosa, and tongue normal  NECK: supple, no adenopathy, no JVD LYMPH:  no palpable lymphadenopathy, no hepatosplenomegaly BREAST:not examined LUNGS: clear to auscultation , and palpation HEART: regular rate & rhythm and no murmurs ABDOMEN:abdomen soft, non-tender and normal bowel sounds BACK: Back symmetric, no curvature., No CVA tenderness EXTREMITIES:no joint deformities, effusion, or inflammation, no edema, no skin discoloration, no clubbing  NEURO: alert & oriented x 3 with fluent speech, no focal motor/sensory deficits  PERFORMANCE STATUS: ECOG 0  LABORATORY DATA: Lab Results  Component Value Date   WBC 3.0* 09/24/2013   HGB 11.2* 09/24/2013   HCT 35.7 09/24/2013   MCV 87.6 09/24/2013   PLT 259 09/24/2013      Chemistry      Component Value Date/Time   NA 140 09/05/2013 1017   K 3.9 09/05/2013 1017   CL 104 09/05/2013 1017   CO2 26 09/05/2013 1017   BUN 9 09/05/2013 1017   CREATININE 0.96 09/05/2013 1017   CREATININE 1.09 06/29/2013 0547      Component Value Date/Time   CALCIUM 10.0 09/05/2013 1017   ALKPHOS 75 09/05/2013 1017   AST 16 09/05/2013 1017   ALT 10 09/05/2013 1017   BILITOT 0.6 09/05/2013 1017       RADIOGRAPHIC STUDIES: No results found.  ASSESSMENT: This is a very pleasant 52 years old African American female with persistent pancytopenia could be secondary to  infection versus rheumatologic disorders versus ethnic leukocytopenia but other bone marrow abnormalities cannot be excluded at this point.   PLAN: I have a lengthy discussion with the patient and and her family today about her condition. I ordered several studies to evaluate her leukocytopenia including repeat CBC, comprehensive metabolic panel, LDH, ANA, rheumatoid factor, acute hepatitis panel as well as HIV test.  I would see the patient back for followup visit in 2 weeks with repeat CBC and if she continues to have persistent thrombocytopenia I may consider her for a bone marrow biopsy and aspirate to rule out any other etiology. The patient is feeling fine today and currently asymptomatic from her lower white blood count. She was advised to call me immediately she has any concerning symptoms. I gave the patient and her family the time to ask questions and I answered them completely to their satisfactions. The patient voices understanding of current disease status and treatment options and is in agreement with the current care plan.  All questions were answered. The patient knows to call the clinic with any problems, questions or concerns. We can certainly see the patient much sooner if necessary.  Thank you so much for allowing me to  participate in the care of Julie Miranda. I will continue to follow up the patient with you and assist in her care.  I spent 35 minutes counseling the patient face to face. The total time spent in the appointment was 55 minutes.  [facility] K. 09/24/2013, 3:06 PM

## 2013-09-24 NOTE — Telephone Encounter (Signed)
appts made per 12/1 POF AVS and CAL given shh °

## 2013-09-24 NOTE — Progress Notes (Signed)
Checked in new patient with no financial issues. She has appt card. °

## 2013-09-24 NOTE — Patient Instructions (Signed)
Followup visit in 2 weeks with repeat CBC.

## 2013-09-25 LAB — HEPATITIS PANEL, ACUTE
HCV Ab: NEGATIVE
Hep A IgM: NONREACTIVE
Hep B C IgM: NONREACTIVE

## 2013-09-25 LAB — ANA: Anti Nuclear Antibody(ANA): NEGATIVE

## 2013-09-25 LAB — RHEUMATOID FACTOR: Rhuematoid fact SerPl-aCnc: <10 IU/mL (ref <=14)

## 2013-09-27 ENCOUNTER — Encounter: Payer: Self-pay | Admitting: *Deleted

## 2013-09-28 ENCOUNTER — Encounter: Payer: Self-pay | Admitting: Internal Medicine

## 2013-10-04 ENCOUNTER — Ambulatory Visit (INDEPENDENT_AMBULATORY_CARE_PROVIDER_SITE_OTHER): Payer: No Typology Code available for payment source | Admitting: Obstetrics & Gynecology

## 2013-10-04 ENCOUNTER — Encounter: Payer: Self-pay | Admitting: Obstetrics & Gynecology

## 2013-10-04 VITALS — BP 121/78 | HR 95 | Temp 97.4°F | Ht 65.0 in | Wt 168.5 lb

## 2013-10-04 DIAGNOSIS — N9489 Other specified conditions associated with female genital organs and menstrual cycle: Secondary | ICD-10-CM

## 2013-10-04 DIAGNOSIS — N949 Unspecified condition associated with female genital organs and menstrual cycle: Secondary | ICD-10-CM

## 2013-10-04 DIAGNOSIS — N898 Other specified noninflammatory disorders of vagina: Secondary | ICD-10-CM

## 2013-10-04 DIAGNOSIS — Z124 Encounter for screening for malignant neoplasm of cervix: Secondary | ICD-10-CM

## 2013-10-04 LAB — POCT URINALYSIS DIP (DEVICE)
Glucose, UA: NEGATIVE mg/dL
Nitrite: NEGATIVE
Protein, ur: NEGATIVE mg/dL
Urobilinogen, UA: 0.2 mg/dL (ref 0.0–1.0)
pH: 6.5 (ref 5.0–8.0)

## 2013-10-04 MED ORDER — MEDROXYPROGESTERONE ACETATE 150 MG/ML IM SUSP: 150.0000 mg | INTRAMUSCULAR | Status: DC

## 2013-10-04 NOTE — Progress Notes (Signed)
Pt. States she has UTI from physician at [facility], states he gave her Cipro but it did not help. States her urine still smells but burning has gone away. Pt. Would also like to get off her depo-injections which she was on for DUB; states she has been on it for 3 years and her last injection was 09/15/13.

## 2013-10-04 NOTE — Progress Notes (Signed)
Patient ID: Julie [facility], female   DOB: 1961/09/17, 52 y.o.   MRN: [id number]  Chief Complaint  Patient presents with  . Urinary Tract Infection  No obstetric history on file. G0 No LMP recorded. Patient has had an injection.Depo provera for 2 years   HPI Julie Miranda is a 52 y.o. female.   Referred due to UTI that was treated but still symptoms of odor and dysuria. Culture was negative HPI  Past Medical History  Diagnosis Date  . Thyroid disease   . Hypertension     Past Surgical History  Procedure Laterality Date  . Gastric bypass      History reviewed. No pertinent family history.  Social History History  Substance Use Topics  . Smoking status: Never Smoker   . Smokeless tobacco: Never Used  . Alcohol Use: No    Allergies  Allergen Reactions  . Morphine And Related Nausea And Vomiting    Current Outpatient Prescriptions  Medication Sig Dispense Refill  . amLODipine (NORVASC) 10 MG tablet Take 1 tablet (10 mg total) by mouth daily.  90 tablet  3  . APAP (TYLENOL) 325 MG tablet Take 2 tablets (650 mg total) by mouth every 12 (twelve) hours as needed for headache.  30 tablet  1  . docusate sodium 100 MG CAPS Take 100 mg by mouth 2 (two) times daily.  10 capsule  0  . Estradiol Cypionate (DEPO-ESTRADIOL IM) Inject 1 application into the muscle every 3 (three) months.      . meclizine (ANTIVERT) 25 MG tablet Take 1 tablet (25 mg total) by mouth 3 (three) times daily as needed.  30 tablet  0  . Menthol, Topical Analgesic, (ICY HOT EX) Apply 1 application topically 2 (two) times daily as needed. por pain/spasms      . ranitidine (ZANTAC) 150 MG tablet Take 1 tablet (150 mg total) by mouth 2 (two) times daily.  30 tablet  12  . thyroid 48.75 MG TABS Take 48.75 mg by mouth daily before breakfast.  30 tablet  3  . traMADol (ULTRAM) 50 MG tablet Take 1 tablet (50 mg total) by mouth every 8 (eight) hours as needed for pain.  60 tablet  0  . ondansetron (ZOFRAN) 4 MG tablet  Take 1 tablet (4 mg total) by mouth every 8 (eight) hours as needed for nausea.  20 tablet  0   No current facility-administered medications for this visit.    Review of Systems Review of Systems  Constitutional: Negative for fever.  Gastrointestinal: Negative for abdominal pain.  Genitourinary: Positive for pelvic pain (occasional cramps). Negative for urgency, hematuria, vaginal bleeding and vaginal discharge.    Blood pressure 121/78, pulse 95, temperature 97.4 F (36.3 C), temperature source Oral, height 5\' 5"  (1.651 m), weight 168 lb 8 oz (76.431 kg).  Physical Exam Physical Exam  Constitutional: She is oriented to person, place, and time. She appears well-developed. No distress.  Abdominal: Soft. She exhibits no distension. There is no tenderness.  Genitourinary: Uterus normal.  Vagina mild atrophy, scant d/c, cervix nulliparous  Neurological: She is alert and oriented to person, place, and time.  Skin: Skin is warm and dry.  Psychiatric: She has a normal mood and affect. Her behavior is normal.    Data Reviewed   CLINICAL DATA: Questionable 0 myometrial and endometrial  abnormality on MRI of the lumbar spine  EXAM:  TRANSVAGINAL ULTRASOUND OF PELVIS  TECHNIQUE:  Transvaginal ultrasound examination of the pelvis was performed  including  evaluation of the uterus, ovaries, adnexal regions, and  pelvic cul-de-sac.  COMPARISON: MRI lumbar spine of 06/27/2013  FINDINGS:  Uterus  Measurements: 5.1 x 2.9 x 4.1 cm. No definite myometrial  abnormality is seen.  Endometrium  Thickness: The endometrium is difficult to measure with a moderate  amount of fluid within the entire endometrial cavity. The  endometrium most likely is within normal limits. However the fluid  measures up to 15 mm within the endometrial cavity. Also there does  appear to be an abnormal soft tissue polypoid lesion in the lower  uterine segment of the endometrial cavity measuring up to 11 mm in   diameter consistent with either polyp or mass. Therefore  sonohysterogram and or endometrial biopsy is recommended.  Right ovary  Measurements: The right ovary is obscured by bowel gas.  Left ovary  Measurements: The left ovary also is obscured by bowel gas.  Other findings: No free fluid  IMPRESSION:  1. Suspect endometrial polyp or mass with a moderate amount of fluid  possibly indicating some obstruction to the endocervical canal. A  sonohysterogram and or endometrial biopsy are recommended.  2. No definite myometrial abnormality is seen.  3. Neither ovary is visualized due to bowel gas.  Electronically Signed:  By: Dwyane Dee M.D.  Assessment    Possible endometrial polyp, asymptomatic. On DMPA, perimenopause     Plan   Depo provera, RTC 6 month [facility] Refill DMPA Wet prep sent, UA\ RTC 6  mo        ARNOLD,JAMES 10/04/2013, 1:32 PM

## 2013-10-04 NOTE — Patient Instructions (Signed)
Menopause Menopause is the normal time of life when menstrual periods stop completely. Menopause is complete when you have missed 12 consecutive menstrual periods. It usually occurs between the ages of 48 years and 55 years. Very rarely does a woman develop menopause before the age of 40 years. At menopause, your ovaries stop producing the female hormones estrogen and progesterone. This can cause undesirable symptoms and also affect your health. Sometimes the symptoms may occur 4 5 years before the menopause begins. There is no relationship between menopause and:  Oral contraceptives.  Number of children you had.  Race.  The age your menstrual periods started (menarche). Heavy smokers and very thin women may develop menopause earlier in life. CAUSES  The ovaries stop producing the female hormones estrogen and progesterone.  Other causes include:  Surgery to remove both ovaries.  The ovaries stop functioning for no known reason.  Tumors of the pituitary gland in the brain.  Medical disease that affects the ovaries and hormone production.  Radiation treatment to the abdomen or pelvis.  Chemotherapy that affects the ovaries. SYMPTOMS   Hot flashes.  Night sweats.  Decrease in sex drive.  Vaginal dryness and thinning of the vagina causing painful intercourse.  Dryness of the skin and developing wrinkles.  Headaches.  Tiredness.  Irritability.  Memory problems.  Weight gain.  Bladder infections.  Hair growth of the face and chest.  Infertility. More serious symptoms include:  Loss of bone (osteoporosis) causing breaks (fractures).  Depression.  Hardening and narrowing of the arteries (atherosclerosis) causing heart attacks and strokes. DIAGNOSIS   When the menstrual periods have stopped for 12 straight months.  Physical exam.  Hormone studies of the blood. TREATMENT  There are many treatment choices and nearly as many questions about them. The  decisions to treat or not to treat menopausal changes is an individual choice made with your health care provider. Your health care provider can discuss the treatments with you. Together, you can decide which treatment will work best for you. Your treatment choices may include:   Hormone therapy (estrogen and progesterone).  Non-hormonal medicines.  Treating the individual symptoms with medicine (for example antidepressants for depression).  Herbal medicines that may help specific symptoms.  Counseling by a psychiatrist or psychologist.  Group therapy.  Lifestyle changes including:  Eating healthy.  Regular exercise.  Limiting caffeine and alcohol.  Stress management and meditation.  No treatment. HOME CARE INSTRUCTIONS   Take the medicine your health care provider gives you as directed.  Get plenty of sleep and rest.  Exercise regularly.  Eat a diet that contains calcium (good for the bones) and soy products (acts like estrogen hormone).  Avoid alcoholic beverages.  Do not smoke.  If you have hot flashes, dress in layers.  Take supplements, calcium, and vitamin D to strengthen bones.  You can use over-the-counter lubricants or moisturizers for vaginal dryness.  Group therapy is sometimes very helpful.  Acupuncture may be helpful in some cases. SEEK MEDICAL CARE IF:   You are not sure you are in menopause.  You are having menopausal symptoms and need advice and treatment.  You are still having menstrual periods after age 55 years.  You have pain with intercourse.  Menopause is complete (no menstrual period for 12 months) and you develop vaginal bleeding.  You need a referral to a specialist (gynecologist, psychiatrist, or psychologist) for treatment. SEEK IMMEDIATE MEDICAL CARE IF:   You have severe depression.  You have excessive vaginal bleeding.    You fell and think you have a broken bone.  You have pain when you urinate.  You develop leg or  chest pain.  You have a fast pounding heart beat (palpitations).  You have severe headaches.  You develop vision problems.  You feel a lump in your breast.  You have abdominal pain or severe indigestion. Document Released: 01/01/2004 Document Revised: 06/13/2013 Document Reviewed: 05/10/2013 Exit[employer] Patient Information 2014 [employer].  

## 2013-10-05 LAB — WET PREP, GENITAL: Trich, Wet Prep: NONE SEEN

## 2013-10-06 LAB — URINE CULTURE

## 2013-10-08 ENCOUNTER — Telehealth: Payer: Self-pay | Admitting: *Deleted

## 2013-10-08 NOTE — Telephone Encounter (Signed)
Spoke with patient concerning note from Dr. Debroah Loop. Pt verbalizes understanding.

## 2013-10-08 NOTE — Telephone Encounter (Signed)
Message copied by Dorothyann Peng on Mon Oct 08, 2013 11:46 AM ------      Message from: Adam Phenix      Created: Fri Oct 05, 2013  8:22 AM       Menopausal level, consider stopping depo provera ------

## 2013-10-09 ENCOUNTER — Ambulatory Visit: Payer: No Typology Code available for payment source | Attending: Internal Medicine | Admitting: Internal Medicine

## 2013-10-09 ENCOUNTER — Encounter: Payer: Self-pay | Admitting: Internal Medicine

## 2013-10-09 VITALS — BP 148/90 | HR 69 | Temp 97.8°F | Resp 16 | Ht 65.0 in | Wt 170.0 lb

## 2013-10-09 DIAGNOSIS — E039 Hypothyroidism, unspecified: Secondary | ICD-10-CM | POA: Insufficient documentation

## 2013-10-09 DIAGNOSIS — R51 Headache: Secondary | ICD-10-CM | POA: Insufficient documentation

## 2013-10-09 DIAGNOSIS — N390 Urinary tract infection, site not specified: Secondary | ICD-10-CM

## 2013-10-09 DIAGNOSIS — I10 Essential (primary) hypertension: Secondary | ICD-10-CM

## 2013-10-09 MED ORDER — AMLODIPINE BESYLATE 10 MG PO TABS: 10.0000 mg | ORAL_TABLET | Freq: Every day | ORAL | Status: DC

## 2013-10-09 MED ORDER — THYROID 48.75 MG PO TABS: 48.7500 mg | ORAL_TABLET | Freq: Every day | ORAL | Status: DC

## 2013-10-09 MED ORDER — HYDROCHLOROTHIAZIDE 12.5 MG PO TABS: 12.5000 mg | ORAL_TABLET | Freq: Every day | ORAL | Status: DC

## 2013-10-09 MED ORDER — RANITIDINE HCL 150 MG PO TABS: 150.0000 mg | ORAL_TABLET | Freq: Two times a day (BID) | ORAL | Status: DC

## 2013-10-09 NOTE — Progress Notes (Signed)
Patient ID: [facility], female   DOB: Oct 29, 1960, 52 y.o.   MRN: [id number] Subjective: 52 year old female with history of hypothyroidism, hypertension, anemia, unexplained leukopenia who is here for a followup of her hypertension.   Objective:  Vital signs in last 24 hours:  Filed Vitals:   10/09/13 1030  BP: 148/90  Pulse: 69  Temp: 97.8 F (36.6 C)  TempSrc: Oral  Resp: 16  Height: 5\' 5"  (1.651 m)  Weight: 170 lb (77.111 kg)  SpO2: 100%      Physical Exam:  General: Middle aged female in no acute distress. HEENT: no pallor, no icterus, moist oral mucosa, no JVD, no lymphadenopathy Heart: Normal  s1 &s2  Regular rate and rhythm, without murmurs, rubs, gallops. Lungs: Clear to auscultation bilaterally. Abdomen: Soft, nontender, nondistended, positive bowel sounds. Extremities: No clubbing cyanosis or edema with positive pedal pulses. Neuro: Alert, awake, oriented x3, nonfocal.   Lab Results:  Basic Metabolic Panel:    Component Value Date/Time   NA 140 09/05/2013 1017   K 3.9 09/05/2013 1017   CL 104 09/05/2013 1017   CO2 26 09/05/2013 1017   BUN 9 09/05/2013 1017   CREATININE 0.96 09/05/2013 1017   CREATININE 1.09 06/29/2013 0547   GLUCOSE 81 09/05/2013 1017   CALCIUM 10.0 09/05/2013 1017   CBC:    Component Value Date/Time   WBC 3.0* 09/24/2013 1342   WBC 2.3* 09/05/2013 0948   HGB 11.2* 09/24/2013 1342   HGB 11.9* 09/05/2013 0948   HCT 35.7 09/24/2013 1342   HCT 36.2 09/05/2013 0948   PLT 259 09/24/2013 1342   PLT 308 09/05/2013 0948   MCV 87.6 09/24/2013 1342   MCV 83.2 09/05/2013 0948   NEUTROABS 1.3* 09/24/2013 1342   NEUTROABS 0.9* 09/05/2013 0948   LYMPHSABS 1.4 09/24/2013 1342   LYMPHSABS 1.0 09/05/2013 0948   MONOABS 0.2 09/24/2013 1342   MONOABS 0.2 09/05/2013 0948   EOSABS 0.1 09/24/2013 1342   EOSABS 0.1 09/05/2013 0948   BASOSABS 0.0 09/24/2013 1342   BASOSABS 0.0 09/05/2013 0948    Recent Results (from the past 240 hour(s))  WET PREP,  GENITAL     Status: None   Collection Time    10/04/13  3:13 PM      Result Value Range Status   Yeast Wet Prep HPF POC NONE SEEN  NONE SEEN Final   Trich, Wet Prep NONE SEEN  NONE SEEN Final   Clue Cells Wet Prep HPF POC NONE SEEN  NONE SEEN Final   WBC, Wet Prep HPF POC NONE SEEN  NONE SEEN Final  URINE CULTURE     Status: None   Collection Time    10/04/13  3:13 PM      Result Value Range Status   Colony Count 10,000 COLONIES/ML   Final   Organism ID, Bacteria Multiple bacterial morphotypes present, none   Final   Organism ID, Bacteria predominant. Suggest appropriate recollection if    Final   Organism ID, Bacteria clinically indicated.   Final    Studies/Results: No results found.  Medications: Scheduled Meds: Continuous Infusions: PRN Meds:.    Assessment/Plan:  Hypertension Blood pressure is still elevated. It was noted to be high normal on previous 2 readings. I will add on a low dose HCTZ. She had mild acute kidney injury about 2 months back possibly from dehydration which has now resolved. We will recheck renal function in one month. Counseled strongly on a low salt intake in diet and medication  adherence.  Headaches Symptoms are chronic. Denies any headache at present.. Head CT done in October showed mild cerebellar atrophy. She does not have further dizziness. Instructed on taking when necessary Tylenol for headache symptoms.  reports taking aspirin when symptoms are severe.   Leukopenia Seen by Dr. Arbutus Ped recently. Ordered ANA, HIV, and hepatitis panel which were all negative. Has followup with her this week and plans for a bone marrow biopsy if labs unremarkable.  Hypothyroidism Continue thyroid hormone  Endometrial polyp Getting Depo-Provera with GYN   UTI Recent Urine culture shows multiple bacterial monocytes. She had UTI symptoms with foul smelling urine and dysuria back in November was treated with a course of ciprofloxacin.  Cultures have been  negative or with mixed growth. She denies any dysuria after she has started taking cranberry juice but reports that her urine smells bad. I would get a repeat urine culture today.  Followup in one week for blood pressure monitoring and then in 3 months. We will get a BMET at that time.  Health maintenance Has had recent GYN evaluation Reports getting flu vaccine this season.   [facility]ie [facility] 10/09/2013, 10:40 AM

## 2013-10-09 NOTE — Progress Notes (Signed)
Pt is following up on her HTN. Pt reports that she is having a hard time sleeping. Pt is still having headaches. She is requesting to review her lab and x ray results.

## 2013-10-09 NOTE — Addendum Note (Signed)
Addended by: Eddie North on: 10/09/2013 11:39 AM   Modules accepted: Orders

## 2013-10-10 LAB — URINE CULTURE: Organism ID, Bacteria: NO GROWTH

## 2013-10-11 ENCOUNTER — Telehealth: Payer: Self-pay | Admitting: Internal Medicine

## 2013-10-11 NOTE — Telephone Encounter (Signed)
Pt called in today to get script filled for traMADol (ULTRAM) 50 MG tablet; pt was here earlier this week and left without receiving script; Pt would like script e-filed to [facility], [address];

## 2013-10-15 ENCOUNTER — Telehealth: Payer: Self-pay | Admitting: Emergency Medicine

## 2013-10-15 NOTE — Telephone Encounter (Signed)
Spoke with pt regarding pain med refill Tramadol. Informed pt to f/u with sports med who wrote script

## 2013-10-23 ENCOUNTER — Encounter: Payer: Self-pay | Admitting: *Deleted

## 2013-10-23 ENCOUNTER — Encounter: Payer: Self-pay | Admitting: Internal Medicine

## 2013-10-23 ENCOUNTER — Other Ambulatory Visit (HOSPITAL_BASED_OUTPATIENT_CLINIC_OR_DEPARTMENT_OTHER): Payer: No Typology Code available for payment source

## 2013-10-23 ENCOUNTER — Telehealth: Payer: Self-pay | Admitting: Internal Medicine

## 2013-10-23 ENCOUNTER — Ambulatory Visit (HOSPITAL_BASED_OUTPATIENT_CLINIC_OR_DEPARTMENT_OTHER): Payer: No Typology Code available for payment source | Admitting: Internal Medicine

## 2013-10-23 VITALS — BP 124/89 | HR 86 | Temp 97.4°F | Resp 18 | Ht 65.0 in | Wt 165.6 lb

## 2013-10-23 DIAGNOSIS — D61818 Other pancytopenia: Secondary | ICD-10-CM

## 2013-10-23 DIAGNOSIS — D72819 Decreased white blood cell count, unspecified: Secondary | ICD-10-CM

## 2013-10-23 DIAGNOSIS — D649 Anemia, unspecified: Secondary | ICD-10-CM

## 2013-10-23 LAB — CBC WITH DIFFERENTIAL/PLATELET
Eosinophils Absolute: 0.1 10*3/uL (ref 0.0–0.5)
HCT: 37.6 % (ref 34.8–46.6)
HGB: 12.4 g/dL (ref 11.6–15.9)
LYMPH%: 49.8 % — ABNORMAL HIGH (ref 14.0–49.7)
MONO#: 0.2 10*3/uL (ref 0.1–0.9)
NEUT#: 1 10*3/uL — ABNORMAL LOW (ref 1.5–6.5)
Platelets: 271 10*3/uL (ref 145–400)
RBC: 4.54 10*6/uL (ref 3.70–5.45)
RDW: 14.5 % (ref 11.2–14.5)
WBC: 2.7 10*3/uL — ABNORMAL LOW (ref 3.9–10.3)
nRBC: 0 % (ref 0–0)

## 2013-10-23 NOTE — Patient Instructions (Signed)
I will arrange for you to have a bone marrow biopsy and aspirate performed next week. Followup visit in 2 weeks for evaluation and discussion of the biopsy results.

## 2013-10-23 NOTE — Progress Notes (Signed)
[facility] [phone]   [phone]  OFFICE PROGRESS NOTE  Jeanann Lewandowsky, MD [address] [place] [address]  DIAGNOSIS: Pancytopenia of unknown etiology  PRIOR THERAPY: None  CURRENT THERAPY: None  INTERVAL HISTORY: Julie Miranda 52 y.o. female returns to the clinic today for followup visit accompanied by family members. The patient is feeling fine today with no specific complaints except for mild fatigue. She denied having any fever or chills. She denied having any chest pain, shortness of breath, cough or hemoptysis. She has no weight loss or night sweats. She has no bleeding issues. She had several studies performed recently for evaluation of her pancytopenia including ANA, rheumatoid factor, HIV antibody, hepatitis panel and these results were unremarkable for any abnormality. She is here today with repeat CBC for evaluation and discussion of her condition.  MEDICAL HISTORY: Past Medical History  Diagnosis Date  . Thyroid disease   . Hypertension     ALLERGIES:  is allergic to morphine and related.  MEDICATIONS:  Current Outpatient Prescriptions  Medication Sig Dispense Refill  . amLODipine (NORVASC) 10 MG tablet Take 1 tablet (10 mg total) by mouth daily.  90 tablet  3  . APAP (TYLENOL) 325 MG tablet Take 2 tablets (650 mg total) by mouth every 12 (twelve) hours as needed for headache.  30 tablet  1  . docusate sodium 100 MG CAPS Take 100 mg by mouth 2 (two) times daily.  10 capsule  0  . hydrochlorothiazide (HYDRODIURIL) 12.5 MG tablet Take 1 tablet (12.5 mg total) by mouth daily.  30 tablet  3  . meclizine (ANTIVERT) 25 MG tablet Take 1 tablet (25 mg total) by mouth 3 (three) times daily as needed.  30 tablet  0  . medroxyPROGESTERone (DEPO-PROVERA) 150 MG/ML injection Inject 1 mL (150 mg total) into the muscle every 3 (three) months.  1 mL  0  . Menthol, Topical Analgesic, (ICY HOT EX) Apply 1 application topically 2  (two) times daily as needed. por pain/spasms      . ondansetron (ZOFRAN) 4 MG tablet Take 1 tablet (4 mg total) by mouth every 8 (eight) hours as needed for nausea.  20 tablet  0  . ranitidine (ZANTAC) 150 MG tablet Take 1 tablet (150 mg total) by mouth 2 (two) times daily.  30 tablet  5  . Thyroid 48.75 MG TABS Take 48.75 mg by mouth daily before breakfast.  30 tablet  3  . traMADol (ULTRAM) 50 MG tablet Take 1 tablet (50 mg total) by mouth every 8 (eight) hours as needed for pain.  60 tablet  0   No current facility-administered medications for this visit.    SURGICAL HISTORY:  Past Surgical History  Procedure Laterality Date  . Gastric bypass      REVIEW OF SYSTEMS:  A comprehensive review of systems was negative except for: Constitutional: positive for fatigue   PHYSICAL EXAMINATION: General appearance: alert, cooperative, fatigued and no distress Head: Normocephalic, without obvious abnormality, atraumatic Neck: no adenopathy, no JVD, supple, symmetrical, trachea midline and thyroid not enlarged, symmetric, no tenderness/mass/nodules Lymph nodes: Cervical, supraclavicular, and axillary nodes normal. Resp: clear to auscultation bilaterally Back: symmetric, no curvature. ROM normal. No CVA tenderness. Cardio: regular rate and rhythm, S1, S2 normal, no murmur, click, rub or gallop GI: soft, non-tender; bowel sounds normal; no masses,  no organomegaly Extremities: extremities normal, atraumatic, no cyanosis or edema  ECOG PERFORMANCE STATUS: 1 - Symptomatic  but completely ambulatory  Blood pressure 124/89, pulse 86, temperature 97.4 F (36.3 C), temperature source Oral, resp. rate 18, height 5\' 5"  (1.651 m), weight 165 lb 9.6 oz (75.116 kg).  LABORATORY DATA: Lab Results  Component Value Date   WBC 2.7* 10/23/2013   HGB 12.4 10/23/2013   HCT 37.6 10/23/2013   MCV 82.8 10/23/2013   PLT 271 10/23/2013      Chemistry      Component Value Date/Time   NA 140 09/05/2013 1017     K 3.9 09/05/2013 1017   CL 104 09/05/2013 1017   CO2 26 09/05/2013 1017   BUN 9 09/05/2013 1017   CREATININE 0.96 09/05/2013 1017   CREATININE 1.09 06/29/2013 0547      Component Value Date/Time   CALCIUM 10.0 09/05/2013 1017   ALKPHOS 75 09/05/2013 1017   AST 16 09/05/2013 1017   ALT 10 09/05/2013 1017   BILITOT 0.6 09/05/2013 1017       RADIOGRAPHIC STUDIES: No results found.  ASSESSMENT AND PLAN: This is a very pleasant 52 years old [place] American female with pancytopenia of unknown etiology. The recent blood work was not remarkable for any significant abnormalities. I discussed the lab result with the patient today. I recommended for her to proceed with the bone marrow biopsy and aspirate to rule out any bone marrow abnormalities. I would see her back for followup visit in 2 weeks for evaluation and discussion of her biopsy results. She was advised to call immediately if she has any concerning symptoms in the interval. The patient voices understanding of current disease status and treatment options and is in agreement with the current care plan.  All questions were answered. The patient knows to call the clinic with any problems, questions or concerns. We can certainly see the patient much sooner if necessary.

## 2013-10-23 NOTE — Progress Notes (Signed)
Per pt request, work note written for patient stating that she was in the office with Dr Arbutus Ped today.  SLJ

## 2013-10-23 NOTE — Telephone Encounter (Signed)
[facility] PT APPT SCHEDULE FOR JANUARY. [facility] WILL CALL PT RE BX. PT AWARE.

## 2013-10-24 ENCOUNTER — Other Ambulatory Visit: Payer: No Typology Code available for payment source | Admitting: Lab

## 2013-10-24 ENCOUNTER — Ambulatory Visit: Payer: No Typology Code available for payment source | Admitting: Internal Medicine

## 2013-10-26 ENCOUNTER — Encounter: Payer: Self-pay | Admitting: Internal Medicine

## 2013-10-30 ENCOUNTER — Encounter: Payer: Self-pay | Admitting: Internal Medicine

## 2013-10-30 ENCOUNTER — Ambulatory Visit (INDEPENDENT_AMBULATORY_CARE_PROVIDER_SITE_OTHER): Payer: No Typology Code available for payment source | Admitting: Internal Medicine

## 2013-10-30 VITALS — BP 110/82 | HR 84 | Ht 65.0 in | Wt 166.4 lb

## 2013-10-30 DIAGNOSIS — K219 Gastro-esophageal reflux disease without esophagitis: Secondary | ICD-10-CM | POA: Insufficient documentation

## 2013-10-30 DIAGNOSIS — K3189 Other diseases of stomach and duodenum: Secondary | ICD-10-CM

## 2013-10-30 DIAGNOSIS — R198 Other specified symptoms and signs involving the digestive system and abdomen: Secondary | ICD-10-CM

## 2013-10-30 DIAGNOSIS — Z8719 Personal history of other diseases of the digestive system: Secondary | ICD-10-CM

## 2013-10-30 DIAGNOSIS — Z8711 Personal history of peptic ulcer disease: Secondary | ICD-10-CM

## 2013-10-30 DIAGNOSIS — Z1211 Encounter for screening for malignant neoplasm of colon: Secondary | ICD-10-CM

## 2013-10-30 DIAGNOSIS — R1013 Epigastric pain: Secondary | ICD-10-CM

## 2013-10-30 MED ORDER — ESOMEPRAZOLE MAGNESIUM 40 MG PO CPDR: 40.0000 mg | DELAYED_RELEASE_CAPSULE | Freq: Every day | ORAL | Status: DC

## 2013-10-30 MED ORDER — MOVIPREP 100 G PO SOLR
ORAL | Status: DC
Start: 2013-10-30 — End: 2013-11-16

## 2013-10-30 NOTE — Patient Instructions (Addendum)
You have been scheduled for a Endoscopy/ colonoscopy with propofol. Please follow written instructions given to you at your visit today.  Please pick up your prep kit at the pharmacy within the next 1-3 days. If you use inhalers (even only as needed), please bring them with you on the day of your procedure. Your physician has requested that you go to www.startemmi.com and enter the access code given to you at your visit today. This web site gives a general overview about your procedure. However, you should still follow specific instructions given to you by our office regarding your preparation for the procedure.    We have sent the following medications to your pharmacy for you to pick up at your convenience: [facility] has requested that you go to the basement for the following lab work before leaving today: H Pylori stool                                               We are excited to introduce MyChart, a new best-in-class service that provides you online access to important information in your electronic medical record. We want to make it easier for you to view your health information - all in one secure location - when and where you need it. We expect MyChart will enhance the quality of care and service we provide.  When you register for MyChart, you can:    View your test results.    Request appointments and receive appointment reminders via email.    Request medication renewals.    View your medical history, allergies, medications and immunizations.    Communicate with your physician's office through a password-protected site.    Conveniently print information such as your medication lists.  To find out if MyChart is right for you, please talk to a member of our clinical staff today. We will gladly answer your questions about this free health and wellness tool.  If you are age 26 or older and want a member of your family to have access to your record, you must  provide written consent by completing a proxy form available at our office. Please speak to our clinical staff about guidelines regarding accounts for patients younger than age 8.  As you activate your MyChart account and need any technical assistance, please call the MyChart technical support line at [phone]) or email your question to [email or web address]. If you email your question(s), please include your name, a return phone number and the best time to reach you.  If you have non-urgent health-related questions, you can send a message to our office through [place] at [email or web address]. If you have a medical emergency, call 911.  Thank you for using MyChart as your new health and wellness resource!   MyChart licensed from [employer],  1999-2010. Patents Pending.

## 2013-10-30 NOTE — Progress Notes (Signed)
Patient ID: Julie Miranda, female   DOB: 04/07/1961, 53 y.o.   MRN: [id number] HPI: Julie Miranda is a 53 year old female with a past medical history of obesity status post Roux-en-Y gastric bypass in 2004, PUD, hypothyroidism and hypertension who is seen in consultation at the request of Dr. Candiss Norse to evaluate epigastric pain and reflux. She is here alone today. She reports over the last 4-6 months developing severe indigestion" all the time" this is described as an epigastric burning pain which radiates into her chest. She reports water brash. She has nausea and occasional vomiting. She reports decrease in appetite and "no taste for any foods". She has been using Zantac 150 mg twice daily, but on some days uses it 4 times a day. This does help with the pain but the effect does not last very long. Occasionally she feels the pain did radiate into her back and under her shoulder blades. She reports she has felt this pain before and was diagnosed with gastric ulcer. Previously she took Nexium which she says is the only medication which provided any benefit. She reports a long-standing constipation and she uses milk of magnesia once or twice weekly. No blood in her stool or melena.   She reports a previous upper endoscopy which was normal before her Roux-en-Y, and then years later she was diagnosed with gastric ulcers. These reports are not available at present. She does not recall ever being diagnosed or treated for H. Pylori. She's never had a colonoscopy She denies a family history of colon cancer  She has been evaluated by Dr. Julien Nordmann for leukopenia and has a bone marrow biopsy scheduled for next Friday  Past Medical History  Diagnosis Date  . Thyroid disease   . Hypertension   . Gastric ulcer   . GERD (gastroesophageal reflux disease)     Past Surgical History  Procedure Laterality Date  . Gastric bypass      Current Outpatient Prescriptions  Medication Sig Dispense Refill  . amLODipine  (NORVASC) 10 MG tablet Take 1 tablet (10 mg total) by mouth daily.  90 tablet  3  . APAP (TYLENOL) 325 MG tablet Take 2 tablets (650 mg total) by mouth every 12 (twelve) hours as needed for headache.  30 tablet  1  . docusate sodium 100 MG CAPS Take 100 mg by mouth 2 (two) times daily.  10 capsule  0  . hydrochlorothiazide (HYDRODIURIL) 12.5 MG tablet Take 1 tablet (12.5 mg total) by mouth daily.  30 tablet  3  . meclizine (ANTIVERT) 25 MG tablet Take 1 tablet (25 mg total) by mouth 3 (three) times daily as needed.  30 tablet  0  . medroxyPROGESTERone (DEPO-PROVERA) 150 MG/ML injection Inject 1 mL (150 mg total) into the muscle every 3 (three) months.  1 mL  0  . Menthol, Topical Analgesic, (ICY HOT EX) Apply 1 application topically 2 (two) times daily as needed. por pain/spasms      . ondansetron (ZOFRAN) 4 MG tablet Take 1 tablet (4 mg total) by mouth every 8 (eight) hours as needed for nausea.  20 tablet  0  . ranitidine (ZANTAC) 150 MG tablet Take 1 tablet (150 mg total) by mouth 2 (two) times daily.  30 tablet  5  . Thyroid 48.75 MG TABS Take 48.75 mg by mouth daily before breakfast.  30 tablet  3  . traMADol (ULTRAM) 50 MG tablet Take 1 tablet (50 mg total) by mouth every 8 (eight) hours as needed for pain.  60 tablet  0  . esomeprazole (NEXIUM) 40 MG capsule Take 1 capsule (40 mg total) by mouth daily at 12 noon.  30 capsule  6  . MOVIPREP 100 G SOLR Use per prep instruction  1 kit  0   No current facility-administered medications for this visit.    Allergies  Allergen Reactions  . Morphine And Related Nausea And Vomiting    Family History  Problem Relation Age of Onset  . Colon cancer Neg Hx   . Diabetes Mother   . Diabetes Maternal Aunt     x 2  . Heart disease Sister   . Liver cancer Maternal Aunt   . Kidney disease Neg Hx     History  Substance Use Topics  . Smoking status: Never Smoker   . Smokeless tobacco: Never Used  . Alcohol Use: No    ROS: As per history of  present illness, otherwise negative  BP 110/82  Pulse 84  Ht _0  (1.651 m)  Wt 166 lb 6.4 oz (75.479 kg)  BMI 27.69 kg/m2 Constitutional: Well-developed and well-nourished. No distress. HEENT: Normocephalic and atraumatic. Oropharynx is clear and moist. No oropharyngeal exudate. Conjunctivae are normal.  No scleral icterus. Upper and lower dentures Neck: Neck supple. Trachea midline. Cardiovascular: Normal rate, regular rhythm and intact distal pulses. No M/R/G Pulmonary/chest: Effort normal and breath sounds normal. No wheezing, rales or rhonchi. Abdominal: Soft, epigastric pain without rebound or guarding, nondistended. Bowel sounds active throughout. Well-healed laparoscopic scars Extremities: no clubbing, cyanosis, or edema Lymphadenopathy: No cervical adenopathy noted. Neurological: Alert and oriented to person place and time. Skin: Skin is warm and dry. No rashes noted. Psychiatric: Normal mood and affect. Behavior is normal.  RELEVANT LABS AND IMAGING: CBC    Component Value Date/Time   WBC 2.7* 10/23/2013 0857   WBC 2.3* 09/05/2013 0948   RBC 4.54 10/23/2013 0857   RBC 4.35 09/05/2013 0948   HGB 12.4 10/23/2013 0857   HGB 11.9* 09/05/2013 0948   HCT 37.6 10/23/2013 0857   HCT 36.2 09/05/2013 0948   PLT 271 10/23/2013 0857   PLT 308 09/05/2013 0948   MCV 82.8 10/23/2013 0857   MCV 83.2 09/05/2013 0948   MCH 27.3 10/23/2013 0857   MCH 27.4 09/05/2013 0948   MCHC 33.0 10/23/2013 0857   MCHC 32.9 09/05/2013 0948   RDW 14.5 10/23/2013 0857   RDW 14.8 09/05/2013 0948   LYMPHSABS 1.4 10/23/2013 0857   LYMPHSABS 1.0 09/05/2013 0948   MONOABS 0.2 10/23/2013 0857   MONOABS 0.2 09/05/2013 0948   EOSABS 0.1 10/23/2013 0857   EOSABS 0.1 09/05/2013 0948   BASOSABS 0.1 10/23/2013 0857   BASOSABS 0.0 09/05/2013 0948    CMP     Component Value Date/Time   NA 140 09/05/2013 1017   K 3.9 09/05/2013 1017   CL 104 09/05/2013 1017   CO2 26 09/05/2013 1017   GLUCOSE 81  09/05/2013 1017   BUN 9 09/05/2013 1017   CREATININE 0.96 09/05/2013 1017   CREATININE 1.09 06/29/2013 0547   CALCIUM 10.0 09/05/2013 1017   PROT 7.4 09/05/2013 1017   ALBUMIN 4.6 09/05/2013 1017   AST 16 09/05/2013 1017   ALT 10 09/05/2013 1017   ALKPHOS 75 09/05/2013 1017   BILITOT 0.6 09/05/2013 1017   GFRNONAA 58* 06/29/2013 0547   GFRAA 67* 06/29/2013 0547    ASSESSMENT/PLAN:  53 year old female with a past medical history of obesity status post Roux-en-Y gastric bypass in 2004, PUD, hypothyroidism and  hypertension who is seen in consultation at the request of Dr. Candiss Norse to evaluate epigastric pain and reflux.  1.  Epigastric pain/GERD/hx of PUD -- many of her symptoms could be consistent with ulcer disease at present including her epigastric pain and nausea. It certainly also sounds like her GERD is uncontrolled. I am starting her back on Nexium 40 mg daily. She continues ranitidine 150 mg in the evening if necessary for breakthrough heartburn symptoms. I have also recommended an H. pylori stool antigen to be performed before she starts Nexium. I have recommended upper endoscopy to exclude ulcer disease. We discussed the test today including the risks and benefits and she is agreeable to proceed.  We will schedule this for after her bone marrow biopsy  2.  Colorectal cancer screening -- she is average risk but has never had a colonoscopy. I recommended colonoscopy for screening. We discussed the risks and benefits of colonoscopy and she is agreeable to proceed. We will perform this on the same day as her EGD, again after bone marrow biopsy.

## 2013-10-31 ENCOUNTER — Telehealth: Payer: Self-pay | Admitting: Internal Medicine

## 2013-10-31 NOTE — Telephone Encounter (Signed)
Told pt I will leave her a voucher for a free prep at our front desk. Pt was appreciative.

## 2013-11-02 ENCOUNTER — Other Ambulatory Visit: Payer: Self-pay | Admitting: Radiology

## 2013-11-02 ENCOUNTER — Other Ambulatory Visit: Payer: No Typology Code available for payment source

## 2013-11-02 DIAGNOSIS — R198 Other specified symptoms and signs involving the digestive system and abdomen: Secondary | ICD-10-CM

## 2013-11-02 DIAGNOSIS — Z1211 Encounter for screening for malignant neoplasm of colon: Secondary | ICD-10-CM

## 2013-11-02 DIAGNOSIS — R1013 Epigastric pain: Secondary | ICD-10-CM

## 2013-11-06 ENCOUNTER — Ambulatory Visit (HOSPITAL_COMMUNITY): Payer: No Typology Code available for payment source

## 2013-11-06 ENCOUNTER — Inpatient Hospital Stay (HOSPITAL_COMMUNITY): Admission: RE | Admit: 2013-11-06 | Payer: No Typology Code available for payment source | Source: Ambulatory Visit

## 2013-11-07 ENCOUNTER — Ambulatory Visit: Payer: No Typology Code available for payment source | Admitting: Internal Medicine

## 2013-11-07 ENCOUNTER — Telehealth: Payer: Self-pay | Admitting: Internal Medicine

## 2013-11-07 ENCOUNTER — Other Ambulatory Visit: Payer: No Typology Code available for payment source

## 2013-11-07 LAB — H. PYLORI ANTIGEN, STOOL: H PYLORI AG STL: NEGATIVE

## 2013-11-08 ENCOUNTER — Other Ambulatory Visit: Payer: Self-pay | Admitting: Radiology

## 2013-11-08 MED ORDER — PANTOPRAZOLE SODIUM 40 MG PO TBEC: 40.0000 mg | DELAYED_RELEASE_TABLET | Freq: Every day | ORAL | Status: DC

## 2013-11-08 NOTE — Telephone Encounter (Signed)
Sent in Pantoprazole to pt's pharmacy since Nexium in too expensive. Called to let pt know.

## 2013-11-08 NOTE — Telephone Encounter (Signed)
Can try pantoprazole 40 mg daily

## 2013-11-09 ENCOUNTER — Ambulatory Visit (HOSPITAL_COMMUNITY)
Admission: RE | Admit: 2013-11-09 | Discharge: 2013-11-09 | Disposition: A | Discharge status: Home / Self Care | Payer: Self-pay | Source: Ambulatory Visit | Attending: Internal Medicine | Admitting: Internal Medicine

## 2013-11-09 ENCOUNTER — Encounter (HOSPITAL_COMMUNITY): Payer: Self-pay

## 2013-11-09 DIAGNOSIS — K219 Gastro-esophageal reflux disease without esophagitis: Secondary | ICD-10-CM | POA: Insufficient documentation

## 2013-11-09 DIAGNOSIS — E079 Disorder of thyroid, unspecified: Secondary | ICD-10-CM | POA: Insufficient documentation

## 2013-11-09 DIAGNOSIS — Z79899 Other long term (current) drug therapy: Secondary | ICD-10-CM | POA: Insufficient documentation

## 2013-11-09 DIAGNOSIS — I10 Essential (primary) hypertension: Secondary | ICD-10-CM | POA: Insufficient documentation

## 2013-11-09 DIAGNOSIS — Z9884 Bariatric surgery status: Secondary | ICD-10-CM | POA: Insufficient documentation

## 2013-11-09 DIAGNOSIS — D649 Anemia, unspecified: Secondary | ICD-10-CM | POA: Insufficient documentation

## 2013-11-09 DIAGNOSIS — D72819 Decreased white blood cell count, unspecified: Secondary | ICD-10-CM | POA: Insufficient documentation

## 2013-11-09 DIAGNOSIS — D704 Cyclic neutropenia: Secondary | ICD-10-CM | POA: Insufficient documentation

## 2013-11-09 LAB — BONE MARROW EXAM

## 2013-11-09 LAB — PROTIME-INR
INR: 0.98 (ref 0.00–1.49)
Prothrombin Time: 12.8 seconds (ref 11.6–15.2)

## 2013-11-09 LAB — CBC
HCT: 35 % — ABNORMAL LOW (ref 36.0–46.0)
HEMOGLOBIN: 11.4 g/dL — AB (ref 12.0–15.0)
MCH: 27.4 pg (ref 26.0–34.0)
MCHC: 32.6 g/dL (ref 30.0–36.0)
MCV: 84.1 fL (ref 78.0–100.0)
PLATELETS: 275 10*3/uL (ref 150–400)
RBC: 4.16 MIL/uL (ref 3.87–5.11)
RDW: 14.6 % (ref 11.5–15.5)
WBC: 2.6 10*3/uL — AB (ref 4.0–10.5)

## 2013-11-09 MED ORDER — FENTANYL CITRATE 0.05 MG/ML IJ SOLN
INTRAMUSCULAR | Status: AC | PRN
  Administered 2013-11-09 (×2): 50 ug via INTRAVENOUS

## 2013-11-09 MED ORDER — SODIUM CHLORIDE 0.9 % IV SOLN
INTRAVENOUS | Status: DC
  Administered 2013-11-09: 20 mL/h via INTRAVENOUS

## 2013-11-09 MED ORDER — MIDAZOLAM HCL 2 MG/2ML IJ SOLN
INTRAMUSCULAR | Status: AC
  Filled 2013-11-09: qty 6

## 2013-11-09 MED ORDER — FENTANYL CITRATE 0.05 MG/ML IJ SOLN
INTRAMUSCULAR | Status: AC
  Filled 2013-11-09: qty 6

## 2013-11-09 MED ORDER — MIDAZOLAM HCL 2 MG/2ML IJ SOLN
INTRAMUSCULAR | Status: AC | PRN
  Administered 2013-11-09: 1 mg via INTRAVENOUS
  Administered 2013-11-09 (×2): 0.5 mg via INTRAVENOUS
  Administered 2013-11-09: 1 mg via INTRAVENOUS

## 2013-11-09 NOTE — Discharge Instructions (Signed)
Moderate Sedation, Adult °Moderate sedation is given to help you relax or even sleep through a procedure. You may remain sleepy, be clumsy, or have poor balance for several hours following this procedure. Arrange for a responsible adult, family member, or friend to take you home. A responsible adult should stay with you for at least 24 hours or until the medicines have worn off. °· Do not participate in any activities where you could become injured for the next 24 hours, or until you feel normal again. Do not: °· Drive. °· Swim. °· Ride a bicycle. °· Operate heavy machinery. °· Cook. °· Use power tools. °· Climb ladders. °· Work at heights. °· Do not make important decisions or sign legal documents until you are improved. °· Vomiting may occur if you eat too soon. When you can drink without vomiting, try water, juice, or soup. Try solid foods if you feel little or no nausea. °· Only take over-the-counter or prescription medications for pain, discomfort, or fever as directed by your caregiver.If pain medications have been prescribed for you, ask your caregiver how soon it is safe to take them. °· Make sure you and your family fully understands everything about the medication given to you. Make sure you understand what side effects may occur. °· You should not drink alcohol, take sleeping pills, or medications that cause drowsiness for at least 24 hours. °· If you smoke, do not smoke alone. °· If you are feeling better, you may resume normal activities 24 hours after receiving sedation. °· Keep all appointments as scheduled. Follow all instructions. °· Ask questions if you do not understand. °SEEK MEDICAL CARE IF:  °· Your skin is pale or bluish in color. °· You continue to feel sick to your stomach (nauseous) or throw up (vomit). °· Your pain is getting worse and not helped by medication. °· You have bleeding or swelling. °· You are still sleepy or feeling clumsy after 24 hours. °SEEK IMMEDIATE MEDICAL CARE IF:   °· You develop a rash. °· You have difficulty breathing. °· You develop any type of allergic problem. °· You have a fever. °Document Released: 07/06/2001 Document Revised: 01/03/2012 Document Reviewed: 06/18/2013 °ExitCare® Patient Information ©2014 [employer]. °Bone Marrow Aspiration, Bone Marrow Biopsy °Care After °Read the instructions outlined below and refer to this sheet in the next few weeks. These discharge instructions provide you with general information on caring for yourself after you leave the hospital. Your caregiver may also give you specific instructions. While your treatment has been planned according to the most current medical practices available, unavoidable complications occasionally occur. If you have any problems or questions after discharge, call your caregiver. °FINDING OUT THE RESULTS OF YOUR TEST °Not all test results are available during your visit. If your test results are not back during the visit, make an appointment with your caregiver to find out the results. Do not assume everything is normal if you have not heard from your caregiver or the medical facility. It is important for you to follow up on all of your test results.  °HOME CARE INSTRUCTIONS  °You have had sedation and may be sleepy or dizzy. Your thinking may not be as clear as usual. For the next 24 hours: °· Only take over-the-counter or prescription medicines for pain, discomfort, and or fever as directed by your caregiver. °· Do not drink alcohol. °· Do not smoke. °· Do not drive. °· Do not make important legal decisions. °· Do not operate heavy machinery. °·   Do not care for small children by yourself. °· Keep your dressing clean and dry. You may replace dressing with a bandage after 24 hours. °· You may take a bath or shower after 24 hours. °· Use an ice pack for 20 minutes every 2 hours while awake for pain as needed. °SEEK MEDICAL CARE IF:  °· There is redness, swelling, or increasing pain at the biopsy  site. °· There is pus coming from the biopsy site. °· There is drainage from a biopsy site lasting longer than one day. °· An unexplained oral temperature above 102° F (38.9° C) develops. °SEEK IMMEDIATE MEDICAL CARE IF:  °· You develop a rash. °· You have difficulty breathing. °· You develop any reaction or side effects to medications given. °Document Released: 04/30/2005 Document Revised: 01/03/2012 Document Reviewed: 10/08/2008 °ExitCare® Patient Information ©2014 [employer]. ° °

## 2013-11-09 NOTE — H&P (Signed)
Julie Miranda is an 53 y.o. female.   Chief Complaint: "I'm having a bone marrow biopsy" HPI: Patient with history of pancytopenia/neutropenia of unknown etiology presents today for CT guided bone marrow biopsy.  Past Medical History  Diagnosis Date  . Thyroid disease   . Hypertension   . Gastric ulcer   . GERD (gastroesophageal reflux disease)     Past Surgical History  Procedure Laterality Date  . Gastric bypass      Family History  Problem Relation Age of Onset  . Colon cancer Neg Hx   . Diabetes Mother   . Diabetes Maternal Aunt     x 2  . Heart disease Sister   . Liver cancer Maternal Aunt   . Kidney disease Neg Hx    Social History:  reports that she has never smoked. She has never used smokeless tobacco. She reports that she does not drink alcohol or use illicit drugs.  Allergies:  Allergies  Allergen Reactions  . Morphine And Related Nausea And Vomiting    Current outpatient prescriptions:amLODipine (NORVASC) 10 MG tablet, Take 1 tablet (10 mg total) by mouth daily., Disp: 90 tablet, Rfl: 3;  APAP (TYLENOL) 325 MG tablet, Take 2 tablets (650 mg total) by mouth every 12 (twelve) hours as needed for headache., Disp: 30 tablet, Rfl: 1;  hydrochlorothiazide (HYDRODIURIL) 12.5 MG tablet, Take 1 tablet (12.5 mg total) by mouth daily., Disp: 30 tablet, Rfl: 3 meclizine (ANTIVERT) 25 MG tablet, Take 1 tablet (25 mg total) by mouth 3 (three) times daily as needed., Disp: 30 tablet, Rfl: 0;  ranitidine (ZANTAC) 150 MG tablet, Take 1 tablet (150 mg total) by mouth 2 (two) times daily., Disp: 30 tablet, Rfl: 5;  Thyroid 48.75 MG TABS, Take 48.75 mg by mouth daily before breakfast., Disp: 30 tablet, Rfl: 3;  docusate sodium 100 MG CAPS, Take 100 mg by mouth 2 (two) times daily., Disp: 10 capsule, Rfl: 0 esomeprazole (NEXIUM) 40 MG capsule, Take 1 capsule (40 mg total) by mouth daily at 12 noon., Disp: 30 capsule, Rfl: 6;  medroxyPROGESTERone (DEPO-PROVERA) 150 MG/ML injection, Inject  1 mL (150 mg total) into the muscle every 3 (three) months., Disp: 1 mL, Rfl: 0;  Menthol, Topical Analgesic, (ICY HOT EX), Apply 1 application topically 2 (two) times daily as needed. por pain/spasms, Disp: , Rfl:  MOVIPREP 100 G SOLR, Use per prep instruction, Disp: 1 kit, Rfl: 0;  ondansetron (ZOFRAN) 4 MG tablet, Take 1 tablet (4 mg total) by mouth every 8 (eight) hours as needed for nausea., Disp: 20 tablet, Rfl: 0;  pantoprazole (PROTONIX) 40 MG tablet, Take 1 tablet (40 mg total) by mouth daily., Disp: 90 tablet, Rfl: 3 traMADol (ULTRAM) 50 MG tablet, Take 1 tablet (50 mg total) by mouth every 8 (eight) hours as needed for pain., Disp: 60 tablet, Rfl: 0 Current facility-administered medications:0.9 %  sodium chloride infusion, , Intravenous, Continuous, Ascencion Dike, PA-C, Last Rate: 20 mL/hr at 11/09/13 0715, 20 mL/hr at 11/09/13 0715;  fentaNYL (SUBLIMAZE) 0.05 MG/ML injection, , , , ;  midazolam (VERSED) 2 MG/2ML injection, , , ,    Results for orders placed during the hospital encounter of 11/09/13 (from the past 48 hour(s))  CBC     Status: Abnormal   Collection Time    11/09/13  7:05 AM      Result Value Range   WBC 2.6 (*) 4.0 - 10.5 K/uL   RBC 4.16  3.87 - 5.11 MIL/uL   Hemoglobin 11.4 (*)  12.0 - 15.0 g/dL   HCT 35.0 (*) 36.0 - 46.0 %   MCV 84.1  78.0 - 100.0 fL   MCH 27.4  26.0 - 34.0 pg   MCHC 32.6  30.0 - 36.0 g/dL   RDW 14.6  11.5 - 15.5 %   Platelets 275  150 - 400 K/uL  PROTIME-INR     Status: None   Collection Time    11/09/13  7:05 AM      Result Value Range   Prothrombin Time 12.8  11.6 - 15.2 seconds   INR 0.98  0.00 - 1.49   No results found.  Review of Systems  Constitutional: Negative for fever and chills.  Respiratory: Negative for cough and shortness of breath.   Cardiovascular: Negative for chest pain.  Gastrointestinal: Negative for nausea, vomiting and abdominal pain.  Musculoskeletal: Positive for back pain.  Neurological: Positive for headaches.   Endo/Heme/Allergies: Does not bruise/bleed easily.    Blood pressure 125/75, pulse 75, temperature 97.1 F (36.2 C), temperature source Oral, resp. rate 17, height $RemoveBe'5\' 5"'LhurowgPI$  (1.651 m), weight 166 lb (75.297 kg), SpO2 100.00%. Physical Exam  Constitutional: She is oriented to person, place, and time. She appears well-developed and well-nourished.  Cardiovascular: Normal rate and regular rhythm.   Respiratory: Effort normal and breath sounds normal.  GI: Soft. Bowel sounds are normal. There is no tenderness.  Musculoskeletal: Normal range of motion. She exhibits no edema.  Neurological: She is alert and oriented to person, place, and time.     Assessment/Plan Patient with history of pancytopenia/neutropenia of unknown etiology presents today for CT guided bone marrow biopsy. Details/risks of procedure d/w pt/family with their understanding and consent.   ALLRED,D KEVIN 11/09/2013, 8:21 AM

## 2013-11-09 NOTE — Procedures (Signed)
BM Bx R iliac No comp 

## 2013-11-16 ENCOUNTER — Ambulatory Visit (AMBULATORY_SURGERY_CENTER): Payer: Self-pay | Admitting: Internal Medicine

## 2013-11-16 ENCOUNTER — Encounter: Payer: Self-pay | Admitting: Internal Medicine

## 2013-11-16 VITALS — BP 142/67 | HR 58 | Temp 98.1°F | Resp 19 | Ht 65.0 in | Wt 166.0 lb

## 2013-11-16 DIAGNOSIS — Z87898 Personal history of other specified conditions: Secondary | ICD-10-CM

## 2013-11-16 DIAGNOSIS — R1013 Epigastric pain: Secondary | ICD-10-CM

## 2013-11-16 DIAGNOSIS — K219 Gastro-esophageal reflux disease without esophagitis: Secondary | ICD-10-CM

## 2013-11-16 DIAGNOSIS — D126 Benign neoplasm of colon, unspecified: Secondary | ICD-10-CM

## 2013-11-16 DIAGNOSIS — Z1211 Encounter for screening for malignant neoplasm of colon: Secondary | ICD-10-CM

## 2013-11-16 LAB — CHROMOSOME ANALYSIS, BONE MARROW

## 2013-11-16 MED ORDER — METRONIDAZOLE 250 MG PO TABS: 250.0000 mg | ORAL_TABLET | Freq: Three times a day (TID) | ORAL | Status: DC

## 2013-11-16 MED ORDER — SODIUM CHLORIDE 0.9 % IV SOLN: 500.0000 mL | INTRAVENOUS | Status: DC

## 2013-11-16 NOTE — Patient Instructions (Addendum)
YOU HAD AN ENDOSCOPIC PROCEDURE TODAY AT THE [facility]: Refer to the procedure report that was given to you for any specific questions about what was found during the examination.  If the procedure report does not answer your questions, please call your gastroenterologist to clarify.  If you requested that your care partner not be given the details of your procedure findings, then the procedure report has been included in a sealed envelope for you to review at your convenience later.  YOU SHOULD EXPECT: Some feelings of bloating in the abdomen. Passage of more gas than usual.  Walking can help get rid of the air that was put into your GI tract during the procedure and reduce the bloating. If you had a lower endoscopy (such as a colonoscopy or flexible sigmoidoscopy) you may notice spotting of blood in your stool or on the toilet paper. If you underwent a bowel prep for your procedure, then you may not have a normal bowel movement for a few days.  DIET: Your first meal following the procedure should be a light meal and then it is ok to progress to your normal diet.  A half-sandwich or bowl of soup is an example of a good first meal.  Heavy or fried foods are harder to digest and may make you feel nauseous or bloated.  Likewise meals heavy in dairy and vegetables can cause extra gas to form and this can also increase the bloating.  Drink plenty of fluids but you should avoid alcoholic beverages for 24 hours.  ACTIVITY: Your care partner should take you home directly after the procedure.  You should plan to take it easy, moving slowly for the rest of the day.  You can resume normal activity the day after the procedure however you should NOT DRIVE or use heavy machinery for 24 hours (because of the sedation medicines used during the test).    SYMPTOMS TO REPORT IMMEDIATELY: A gastroenterologist can be reached at any hour.  During normal business hours, 8:30 AM to 5:00 PM Monday through Friday,  call [phone].  After hours and on weekends, please call the GI answering service at [phone] who will take a message and have the physician on call contact you.   Following lower endoscopy (colonoscopy or flexible sigmoidoscopy):  Excessive amounts of blood in the stool  Significant tenderness or worsening of abdominal pains  Swelling of the abdomen that is new, acute  Fever of 100F or higher  Following upper endoscopy (EGD)  Vomiting of blood or coffee ground material  New chest pain or pain under the shoulder blades  Painful or persistently difficult swallowing  New shortness of breath  Fever of 100F or higher  Black, tarry-looking stools  FOLLOW UP: If any biopsies were taken you will be contacted by phone or by letter within the next 1-3 weeks.  Call your gastroenterologist if you have not heard about the biopsies in 3 weeks.  Our staff will call the home number listed on your records the next business day following your procedure to check on you and address any questions or concerns that you may have at that time regarding the information given to you following your procedure. This is a courtesy call and so if there is no answer at the home number and we have not heard from you through the emergency physician on call, we will assume that you have returned to your regular daily activities without incident.  SIGNATURES/CONFIDENTIALITY: You and/or your care   partner have signed paperwork which will be entered into your electronic medical record.  These signatures attest to the fact that that the information above on your After Visit Summary has been reviewed and is understood.  Full responsibility of the confidentiality of this discharge information lies with you and/or your care-partner.  Recommendations Continue taking your Protonix once daily. It's best to take 20-30 minutes prior to breakfast meal. Hold aspirin, aspirin products, and anti-inflammatory medications for 2  weeks Timing of repeat colonoscopy will be determined by pathology findings.

## 2013-11-16 NOTE — Op Note (Signed)
[place]  [address] [place], [address]   COLONOSCOPY PROCEDURE REPORT  PATIENT: Julie Miranda, Julie Miranda  MR#: [id number] BIRTHDATE: 1961/05/03 , 52  yrs. old GENDER: Female ENDOSCOPIST: Jerene Bears, MD PROCEDURE DATE:  11/16/2013 PROCEDURE:   Colonoscopy with snare polypectomy First Screening Colonoscopy - Avg.  risk and is 50 yrs.  old or older Yes.  Prior Negative Screening - Now for repeat screening. N/A  History of Adenoma - Now for follow-up colonoscopy & has been > or = to 3 yrs.  N/A  Polyps Removed Today? Yes. ASA CLASS:   Class II INDICATIONS:average risk screening and first colonoscopy. MEDICATIONS: MAC sedation, administered by CRNA and Propofol (Diprivan) 270 mg IV  DESCRIPTION OF PROCEDURE:   After the risks benefits and alternatives of the procedure were thoroughly explained, informed consent was obtained.  A digital rectal exam revealed no rectal mass.   The LB [id number]  endoscope was introduced through the anus and advanced to the cecum, which was identified by both the appendix and ileocecal valve. No adverse events experienced. The quality of the prep was Moviprep fair  The instrument was then slowly withdrawn as the colon was fully examined.    COLON FINDINGS: A sessile polyp measuring 5 mm in size was found at the splenic flexure.  A polypectomy was performed with a cold snare.  The resection was complete and the polyp tissue was completely retrieved.   A pedunculated polyp measuring 20-25 mm in size was found in the sigmoid colon.  A polypectomy was performed using snare cautery.  The resection was complete and the polyp tissue was completely retrieved.   A sessile polyp measuring 5 mm in size was found in the rectosigmoid colon.  A polypectomy was performed with a cold snare.  The resection was complete and the polyp tissue was completely retrieved.   There was mild scattered diverticulosis noted in the ascending colon and  sigmoid colon. Retroflexed views revealed no abnormalities. The time to cecum=4 minutes 16 seconds.  Withdrawal time=18 minutes 59 seconds.  The scope was withdrawn and the procedure completed.  COMPLICATIONS: There were no complications.   ENDOSCOPIC IMPRESSION: 1.   Sessile polyp measuring 5 mm in size was found at the splenic flexure; polypectomy was performed with a cold snare 2.   Pedunculated polyp measuring 20-25 mm in size was found in the sigmoid colon; polypectomy was performed using snare cautery 3.   Sessile polyp measuring 5 mm in size was found in the rectosigmoid colon; polypectomy was performed with a cold snare 4.   There was mild diverticulosis noted in the ascending colon and sigmoid colon  RECOMMENDATIONS: 1.  Hold aspirin, aspirin products, and anti-inflammatory medication for 2 weeks. 2.  Await pathology results 3.  Timing of repeat colonoscopy will be determined by pathology findings. 4.  You will receive a letter within 1-2 weeks with the results of your biopsy as well as final recommendations.  Please call my office if you have not received a letter after 3 weeks.   eSigned:  Jerene Bears, MD 11/16/2013 4:38 PM   cc: The Patient and Angelica Chessman MD   PATIENT NAME:  Julie Miranda, Julie Miranda MR#: [id number]

## 2013-11-16 NOTE — Progress Notes (Signed)
Procedure ends, to recovery, report given and VSS. 

## 2013-11-16 NOTE — Op Note (Signed)
[place]  [address]. [place], [address]   ENDOSCOPY PROCEDURE REPORT  PATIENT: Julie, Miranda  MR#: [id number] BIRTHDATE: [birth year] , 52  yrs. old GENDER: Female ENDOSCOPIST: Jerene Bears, MD REFERRED BY:  Angelica Chessman, M.D. PROCEDURE DATE:  11/16/2013 PROCEDURE:  EGD, diagnostic ASA CLASS:     Class II INDICATIONS:  Epigastric pain.   History of esophageal reflux. History of PUD MEDICATIONS: MAC sedation, administered by CRNA and Propofol (Diprivan) 180 mg IV TOPICAL ANESTHETIC: none  DESCRIPTION OF PROCEDURE: After the risks benefits and alternatives of the procedure were thoroughly explained, informed consent was obtained.  The LB [id number] endoscope was introduced through the mouth and advanced to the proximal jejunum. Without limitations.  The instrument was slowly withdrawn as the mucosa was fully examined.     ESOPHAGUS: The mucosa of the esophagus appeared normal.   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  5 cm gastric pouch with normal appearing mucosa.  JEJUNUM: The exam showed no abnormalities in the efferent jejunal limb. Retroflexed views revealed no abnormalities.     The scope was then withdrawn from the patient and the procedure completed.  COMPLICATIONS: There were no complications.  ENDOSCOPIC IMPRESSION: 1.   The mucosa of the esophagus appeared normal 2.   Healthy appearing and widely patent Roux -en-Y anastomosis was found in the gastric body 3.   The exam showed no abnormalities in the examined efferent jejunal limb  RECOMMENDATIONS: Continue taking your PPI (pantoprazole) once daily.  It is best to be taken 20-30 minutes prior to breakfast meal.  eSigned:  Jerene Bears, MD 11/16/2013 4:34 PM   CC:The Patient and Angelica Chessman MD

## 2013-11-16 NOTE — Progress Notes (Signed)
Called to room to assist during endoscopic procedure.  Patient ID and intended procedure confirmed with present staff. Received instructions for my participation in the procedure from the performing physician.  

## 2013-11-19 ENCOUNTER — Telehealth: Payer: Self-pay | Admitting: *Deleted

## 2013-11-19 ENCOUNTER — Other Ambulatory Visit: Payer: Self-pay | Admitting: Gastroenterology

## 2013-11-19 NOTE — Telephone Encounter (Signed)
  Follow up Call-  Call back number 11/16/2013  Post procedure Call Back phone  # [phone]  Permission to leave phone message Yes     Patient questions:  Do you have a fever, pain , or abdominal swelling? no Pain Score  0 *  Have you tolerated food without any problems? yes  Have you been able to return to your normal activities? yes  Do you have any questions about your discharge instructions: Diet   no Medications  no Follow up visit  no  Do you have questions or concerns about your Care? no  Actions: * If pain score is 4 or above: No action needed, pain <4.

## 2013-11-20 ENCOUNTER — Encounter (HOSPITAL_COMMUNITY): Payer: Self-pay

## 2013-11-22 ENCOUNTER — Encounter: Payer: Self-pay | Admitting: Internal Medicine

## 2013-11-29 ENCOUNTER — Telehealth: Payer: Self-pay | Admitting: Internal Medicine

## 2013-11-29 ENCOUNTER — Other Ambulatory Visit (HOSPITAL_BASED_OUTPATIENT_CLINIC_OR_DEPARTMENT_OTHER): Payer: Self-pay

## 2013-11-29 ENCOUNTER — Encounter: Payer: Self-pay | Admitting: Physician Assistant

## 2013-11-29 ENCOUNTER — Ambulatory Visit (HOSPITAL_BASED_OUTPATIENT_CLINIC_OR_DEPARTMENT_OTHER): Payer: Self-pay | Admitting: Physician Assistant

## 2013-11-29 VITALS — BP 123/82 | HR 68 | Temp 97.3°F | Resp 18 | Ht 65.0 in | Wt 169.3 lb

## 2013-11-29 DIAGNOSIS — D72810 Lymphocytopenia: Secondary | ICD-10-CM

## 2013-11-29 DIAGNOSIS — D72819 Decreased white blood cell count, unspecified: Secondary | ICD-10-CM

## 2013-11-29 DIAGNOSIS — D649 Anemia, unspecified: Secondary | ICD-10-CM

## 2013-11-29 LAB — CBC WITH DIFFERENTIAL/PLATELET
BASO%: 1.8 % (ref 0.0–2.0)
Basophils Absolute: 0 10*3/uL (ref 0.0–0.1)
EOS%: 4.5 % (ref 0.0–7.0)
Eosinophils Absolute: 0.1 10*3/uL (ref 0.0–0.5)
HCT: 33.3 % — ABNORMAL LOW (ref 34.8–46.6)
HGB: 10.8 g/dL — ABNORMAL LOW (ref 11.6–15.9)
LYMPH%: 42.1 % (ref 14.0–49.7)
MCH: 27.9 pg (ref 25.1–34.0)
MCHC: 32.4 g/dL (ref 31.5–36.0)
MCV: 86 fL (ref 79.5–101.0)
MONO#: 0.3 10*3/uL (ref 0.1–0.9)
MONO%: 9.3 % (ref 0.0–14.0)
NEUT#: 1.1 10*3/uL — ABNORMAL LOW (ref 1.5–6.5)
NEUT%: 42.3 % (ref 38.4–76.8)
PLATELETS: 292 10*3/uL (ref 145–400)
RBC: 3.87 10*6/uL (ref 3.70–5.45)
RDW: 15.7 % — ABNORMAL HIGH (ref 11.2–14.5)
WBC: 2.7 10*3/uL — AB (ref 3.9–10.3)
lymph#: 1.1 10*3/uL (ref 0.9–3.3)

## 2013-11-29 NOTE — Patient Instructions (Signed)
You will remain on observation and return in 3 months for repeat labs to reevaluate your condition. Consider evaluation by a neurologist regarding your complaints of headaches and dizziness

## 2013-11-29 NOTE — Telephone Encounter (Signed)
s.w pt and advised on May appt.....pt ok and aware °

## 2013-11-29 NOTE — Progress Notes (Addendum)
[place] [phone]   Fax:([phone]) [place] VISIT PROGRESS NOTE  Angelica Chessman, MD [place] [address]  DIAGNOSIS: Pancytopenia of unknown etiology  PRIOR THERAPY: None  CURRENT THERAPY: None  INTERVAL HISTORY: Julie Miranda 53 y.o. female returns to the clinic today for followup visit accompanied by family members. The patient is feeling fine today with no specific complaints except for continued fatigue and nausea. She denied having any fever or chills, although she states she feels cold all of the time. She denied having any chest pain, shortness of breath, cough or hemoptysis. She has no weight loss or night sweats. She has no bleeding issues. She reports continued headaches, episodes of dizziness and occasional syncope She had several studies performed recently for evaluation of her pancytopenia including ANA, rheumatoid factor, HIV antibody, hepatitis panel and these results were unremarkable for any abnormality. She was found to be hypothyroid and is currently on thyroid replacement as managed by her primary care physician. She recently had a bone marrow biopsy to further evaluate her condition. She is here today with repeat CBC for evaluation and discussion of her condition.  MEDICAL HISTORY: Past Medical History  Diagnosis Date  . Thyroid disease   . Hypertension   . Gastric ulcer   . GERD (gastroesophageal reflux disease)     ALLERGIES:  is allergic to morphine and related.  MEDICATIONS:  Current Outpatient Prescriptions  Medication Sig Dispense Refill  . amLODipine (NORVASC) 10 MG tablet Take 1 tablet (10 mg total) by mouth daily.  90 tablet  3  . APAP (TYLENOL) 325 MG tablet Take 2 tablets (650 mg total) by mouth every 12 (twelve) hours as needed for headache.  30 tablet  1  . docusate sodium 100 MG CAPS Take 100 mg by mouth 2 (two) times daily.  10 capsule  0  . hydrochlorothiazide (HYDRODIURIL) 12.5 MG  tablet Take 1 tablet (12.5 mg total) by mouth daily.  30 tablet  3  . meclizine (ANTIVERT) 25 MG tablet Take 1 tablet (25 mg total) by mouth 3 (three) times daily as needed.  30 tablet  0  . medroxyPROGESTERone (DEPO-PROVERA) 150 MG/ML injection Inject 1 mL (150 mg total) into the muscle every 3 (three) months.  1 mL  0  . metroNIDAZOLE (FLAGYL) 250 MG tablet Take 1 tablet (250 mg total) by mouth 3 (three) times daily.  21 tablet  0  . ondansetron (ZOFRAN) 4 MG tablet Take 1 tablet (4 mg total) by mouth every 8 (eight) hours as needed for nausea.  20 tablet  0  . pantoprazole (PROTONIX) 40 MG tablet Take 1 tablet (40 mg total) by mouth daily.  90 tablet  3  . ranitidine (ZANTAC) 150 MG tablet Take 1 tablet (150 mg total) by mouth 2 (two) times daily.  30 tablet  5  . Thyroid 48.75 MG TABS Take 48.75 mg by mouth daily before breakfast.  30 tablet  3  . Menthol, Topical Analgesic, (ICY HOT EX) Apply 1 application topically 2 (two) times daily as needed. por pain/spasms      . traMADol (ULTRAM) 50 MG tablet Take 1 tablet (50 mg total) by mouth every 8 (eight) hours as needed for pain.  60 tablet  0   No current facility-administered medications for this visit.    SURGICAL HISTORY:  Past Surgical History  Procedure Laterality Date  . Gastric bypass      REVIEW OF  SYSTEMS:  A comprehensive review of systems was negative except for: Constitutional: positive for fatigue and malaise Gastrointestinal: positive for nausea Neurological: positive for dizziness, headaches and occasional episodes of syncope   PHYSICAL EXAMINATION: General appearance: alert, cooperative, fatigued and no distress Head: Normocephalic, without obvious abnormality, atraumatic Neck: no adenopathy, no JVD, supple, symmetrical, trachea midline and thyroid not enlarged, symmetric, no tenderness/mass/nodules Lymph nodes: Cervical, supraclavicular, and axillary nodes normal. Resp: clear to auscultation bilaterally Back:  symmetric, no curvature. ROM normal. No CVA tenderness. Cardio: regular rate and rhythm, S1, S2 normal, no murmur, click, rub or gallop GI: soft, non-tender; bowel sounds normal; no masses,  no organomegaly Extremities: extremities normal, atraumatic, no cyanosis or edema  ECOG PERFORMANCE STATUS: 1 - Symptomatic but completely ambulatory  Blood pressure 123/82, pulse 68, temperature 97.3 F (36.3 C), temperature source Oral, resp. rate 18, height '5\' 5"'  (1.651 m), weight 169 lb 4.8 oz (76.794 kg).  LABORATORY DATA: Lab Results  Component Value Date   WBC 2.7* 11/29/2013   HGB 10.8* 11/29/2013   HCT 33.3* 11/29/2013   MCV 86.0 11/29/2013   PLT 292 11/29/2013      Chemistry      Component Value Date/Time   NA 140 09/05/2013 1017   K 3.9 09/05/2013 1017   CL 104 09/05/2013 1017   CO2 26 09/05/2013 1017   BUN 9 09/05/2013 1017   CREATININE 0.96 09/05/2013 1017   CREATININE 1.09 06/29/2013 0547      Component Value Date/Time   CALCIUM 10.0 09/05/2013 1017   ALKPHOS 75 09/05/2013 1017   AST 16 09/05/2013 1017   ALT 10 09/05/2013 1017   BILITOT 0.6 09/05/2013 1017       RADIOGRAPHIC STUDIES: No results found.  ASSESSMENT AND PLAN: This is a very pleasant 53 years old [place] American female with pancytopenia of unknown etiology. The recent blood work was not remarkable for any significant abnormalities. Her bone marrow biopsy and aspirate were negative for any abnormalities and cytogenetics were also normal. The patient was discussed with and also seen by Dr. Julien Nordmann. Her leukocyte of pain in may represent an ethnic variant. Patient was advised to follow with her primary care physician regarding her thyroid medication dosing as this may be playing a role in her fatigue and malaise. Regarding her headaches and dizziness with occasional syncope the patient was advised to seek evaluation by a neurologist. As far as her lymphocytopenia we will have her return in 3 months for another symptom  management visit with a repeat CBC differential and LDH. Of note the patient also had a colonoscopy and endoscopy with removal of 3 polyps. Pathology was negative for any high-grade dysplasia or malignancy. With her history of gastric bypass surgery, she may also have some malabsorption issues.  She was advised to call immediately if she has any concerning symptoms in the interval. The patient voices understanding of current disease status and treatment options and is in agreement with the current care plan.  All questions were answered. The patient knows to call the clinic with any problems, questions or concerns. We can certainly see the patient much sooner if necessary.  Carlton Adam PA-C   ADDENDUM: Hematology/Oncology Attending:  I had the face to face encounter with the patient. I recommended her care plan. 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 The patient underwent several studies recently including ANA, rheumatoid factor, hepatitis panel, HIV  antibody as well as bone marrow biopsy and aspirate that showed no significant abnormalities. She is currently asymptomatic. I discussed the lab and biopsy results with the patient today. I recommended for her to continue on observation with repeat CBC and LDH in 3 months. She was advised to call immediately she has any concerning symptoms in the interval.  Disclaimer: This note was dictated with voice recognition software. Similar sounding words can inadvertently be transcribed and may not be corrected upon review. Eilleen Kempf., MD 11/30/2013

## 2014-01-04 ENCOUNTER — Other Ambulatory Visit: Payer: Self-pay | Admitting: Internal Medicine

## 2014-01-07 ENCOUNTER — Ambulatory Visit: Payer: Self-pay | Attending: Internal Medicine | Admitting: Internal Medicine

## 2014-01-07 ENCOUNTER — Encounter: Payer: Self-pay | Admitting: Internal Medicine

## 2014-01-07 ENCOUNTER — Other Ambulatory Visit: Payer: Self-pay | Admitting: *Deleted

## 2014-01-07 VITALS — BP 123/82 | HR 70 | Temp 98.1°F | Resp 16 | Wt 175.2 lb

## 2014-01-07 DIAGNOSIS — Z8711 Personal history of peptic ulcer disease: Secondary | ICD-10-CM | POA: Insufficient documentation

## 2014-01-07 DIAGNOSIS — K219 Gastro-esophageal reflux disease without esophagitis: Secondary | ICD-10-CM | POA: Insufficient documentation

## 2014-01-07 DIAGNOSIS — I10 Essential (primary) hypertension: Secondary | ICD-10-CM | POA: Insufficient documentation

## 2014-01-07 DIAGNOSIS — Z760 Encounter for issue of repeat prescription: Secondary | ICD-10-CM | POA: Insufficient documentation

## 2014-01-07 DIAGNOSIS — E039 Hypothyroidism, unspecified: Secondary | ICD-10-CM | POA: Insufficient documentation

## 2014-01-07 DIAGNOSIS — Z9884 Bariatric surgery status: Secondary | ICD-10-CM | POA: Insufficient documentation

## 2014-01-07 DIAGNOSIS — Z79899 Other long term (current) drug therapy: Secondary | ICD-10-CM | POA: Insufficient documentation

## 2014-01-07 LAB — TSH: TSH: 12.071 u[IU]/mL — AB (ref 0.350–4.500)

## 2014-01-07 MED ORDER — TRAMADOL HCL 50 MG PO TABS: 50.0000 mg | ORAL_TABLET | Freq: Three times a day (TID) | ORAL | Status: DC | PRN

## 2014-01-07 MED ORDER — AMLODIPINE BESYLATE 10 MG PO TABS: 10.0000 mg | ORAL_TABLET | Freq: Every day | ORAL | Status: DC

## 2014-01-07 MED ORDER — HYDROCHLOROTHIAZIDE 12.5 MG PO TABS: 12.5000 mg | ORAL_TABLET | Freq: Every day | ORAL | Status: DC

## 2014-01-07 NOTE — Progress Notes (Signed)
MRN: [id number] Name: Julie Miranda  Sex: female Age: 53 y.o. DOB: [birth year]  Allergies: Morphine and related  Chief Complaint  Patient presents with  . Medication Refill    HPI: Patient is 53 y.o. female who has history of hypertension comes today requesting refill on her blood pressure medications, she also history of hypothyroidism and is taking WP thyroid 48.75 mg, she denies any acute symptoms. Patient has history of leukopenia anemia following up with hematology.  Past Medical History  Diagnosis Date  . Thyroid disease   . Hypertension   . Gastric ulcer   . GERD (gastroesophageal reflux disease)     Past Surgical History  Procedure Laterality Date  . Gastric bypass        Medication List       This list is accurate as of: 01/07/14 10:59 AM.  Always use your most recent med list.               amLODipine 10 MG tablet  Commonly known as:  NORVASC  Take 1 tablet (10 mg total) by mouth daily.     APAP 325 MG tablet  Commonly known as:  TYLENOL  Take 2 tablets (650 mg total) by mouth every 12 (twelve) hours as needed for headache.     DSS 100 MG Caps  Take 100 mg by mouth 2 (two) times daily.     hydrochlorothiazide 12.5 MG tablet  Commonly known as:  HYDRODIURIL  Take 1 tablet (12.5 mg total) by mouth daily.     ICY HOT EX  Apply 1 application topically 2 (two) times daily as needed. por pain/spasms     meclizine 25 MG tablet  Commonly known as:  ANTIVERT  Take 1 tablet (25 mg total) by mouth 3 (three) times daily as needed.     medroxyPROGESTERone 150 MG/ML injection  Commonly known as:  DEPO-PROVERA  Inject 1 mL (150 mg total) into the muscle every 3 (three) months.     metroNIDAZOLE 250 MG tablet  Commonly known as:  FLAGYL  Take 1 tablet (250 mg total) by mouth 3 (three) times daily.     ondansetron 4 MG tablet  Commonly known as:  ZOFRAN  Take 1 tablet (4 mg total) by mouth every 8 (eight) hours as needed for nausea.     pantoprazole  40 MG tablet  Commonly known as:  PROTONIX  Take 1 tablet (40 mg total) by mouth daily.     ranitidine 150 MG tablet  Commonly known as:  ZANTAC  Take 1 tablet (150 mg total) by mouth 2 (two) times daily.     traMADol 50 MG tablet  Commonly known as:  ULTRAM  Take 1 tablet (50 mg total) by mouth every 8 (eight) hours as needed for pain.     WP THYROID 48.75 MG Tabs  Generic drug:  Thyroid  TAKE ONE TABLET BY MOUTH ONCE DAILY BEFORE BREAKFAST        Meds ordered this encounter  Medications  . amLODipine (NORVASC) 10 MG tablet    Sig: Take 1 tablet (10 mg total) by mouth daily.    Dispense:  90 tablet    Refill:  3  . hydrochlorothiazide (HYDRODIURIL) 12.5 MG tablet    Sig: Take 1 tablet (12.5 mg total) by mouth daily.    Dispense:  90 tablet    Refill:  3     There is no immunization history on file for this patient.  Family History  Problem Relation Age of Onset  . Colon cancer Neg Hx   . Diabetes Mother   . Diabetes Maternal Aunt     x 2  . Heart disease Sister   . Liver cancer Maternal Aunt   . Kidney disease Neg Hx     History  Substance Use Topics  . Smoking status: Never Smoker   . Smokeless tobacco: Never Used  . Alcohol Use: No    Review of Systems   As noted in HPI  Filed Vitals:   01/07/14 1037  BP: 123/82  Pulse: 70  Temp: 98.1 F (36.7 C)  Resp: 16    Physical Exam  Physical Exam  Constitutional: No distress.  Eyes: EOM are normal. Pupils are equal, round, and reactive to light.  Cardiovascular: Normal rate and regular rhythm.   Pulmonary/Chest: Breath sounds normal. No respiratory distress. She has no wheezes. She has no rales.    CBC    Component Value Date/Time   WBC 2.7* 11/29/2013 0815   WBC 2.6* 11/09/2013 0705   RBC 3.87 11/29/2013 0815   RBC 4.16 11/09/2013 0705   HGB 10.8* 11/29/2013 0815   HGB 11.4* 11/09/2013 0705   HCT 33.3* 11/29/2013 0815   HCT 35.0* 11/09/2013 0705   PLT 292 11/29/2013 0815   PLT 275 11/09/2013 0705    MCV 86.0 11/29/2013 0815   MCV 84.1 11/09/2013 0705   LYMPHSABS 1.1 11/29/2013 0815   LYMPHSABS 1.0 09/05/2013 0948   MONOABS 0.3 11/29/2013 0815   MONOABS 0.2 09/05/2013 0948   EOSABS 0.1 11/29/2013 0815   EOSABS 0.1 09/05/2013 0948   BASOSABS 0.0 11/29/2013 0815   BASOSABS 0.0 09/05/2013 0948    CMP     Component Value Date/Time   NA 140 09/05/2013 1017   K 3.9 09/05/2013 1017   CL 104 09/05/2013 1017   CO2 26 09/05/2013 1017   GLUCOSE 81 09/05/2013 1017   BUN 9 09/05/2013 1017   CREATININE 0.96 09/05/2013 1017   CREATININE 1.09 06/29/2013 0547   CALCIUM 10.0 09/05/2013 1017   PROT 7.4 09/05/2013 1017   ALBUMIN 4.6 09/05/2013 1017   AST 16 09/05/2013 1017   ALT 10 09/05/2013 1017   ALKPHOS 75 09/05/2013 1017   BILITOT 0.6 09/05/2013 1017   GFRNONAA 58* 06/29/2013 0547   GFRAA 67* 06/29/2013 0547    No results found for this basename: chol, tri, ldl    No components found with this basename: hga1c    Lab Results  Component Value Date/Time   AST 16 09/05/2013 10:17 AM    Assessment and Plan  Essential hypertension, benign -  blood pressure is well controlled Plan: Continue with amLODipine (NORVASC) 10 MG tablet, hydrochlorothiazide (HYDRODIURIL) 12.5 MG tablet  Unspecified hypothyroidism - Plan: Recheck TSH level    Return in about 3 months (around 04/09/2014) for hypertension, hypothyroid.  Lorayne Marek, MD

## 2014-01-07 NOTE — Progress Notes (Signed)
Patient here for medication refill-blood pressure meds

## 2014-01-08 ENCOUNTER — Telehealth: Payer: Self-pay

## 2014-01-08 NOTE — Telephone Encounter (Signed)
Spoke with patient's mom She will give her daughter the results

## 2014-01-08 NOTE — Telephone Encounter (Signed)
Message copied by Dorothe Pea on Tue Jan 08, 2014 12:51 PM ------      Message from: Lorayne Marek      Created: Tue Jan 08, 2014 11:02 AM       Blood work reviewed TSH level is still elevated, advise patient to increase the dose of thyroid from 48.75 mg to 65 mg daily. ------

## 2014-01-14 ENCOUNTER — Telehealth: Payer: Self-pay | Admitting: Internal Medicine

## 2014-01-14 NOTE — Telephone Encounter (Signed)
Pt returning call regarding results for elevated Thyroid. Pt requests that if she is put back on meds if it could be ARMUR.   Pt uses [facility] on [address]

## 2014-01-15 MED ORDER — LEVOTHYROXINE SODIUM 50 MCG PO TABS: 50.0000 ug | ORAL_TABLET | Freq: Every day | ORAL | Status: DC

## 2014-01-15 NOTE — Addendum Note (Signed)
Addended by: Allyson Sabal MD, Ascencion Dike on: 01/15/2014 04:14 PM   Modules accepted: Orders, Medications

## 2014-01-15 NOTE — Telephone Encounter (Signed)
Pt received VM from nurse regarding med change, pt returning call.  Also has question regarding ARMUR indications.

## 2014-01-16 ENCOUNTER — Telehealth: Payer: Self-pay | Admitting: *Deleted

## 2014-01-16 NOTE — Telephone Encounter (Signed)
Pt talked with Rachel Bo. Message completed

## 2014-01-16 NOTE — Telephone Encounter (Signed)
Pt called wanting to know what to do about thyroid medication.  There was a note to increase her thyroid medication Dr. Allyson Sabal prescribed her levothyroxine 50 mcg daily.

## 2014-02-04 ENCOUNTER — Other Ambulatory Visit: Payer: Self-pay | Admitting: Obstetrics & Gynecology

## 2014-02-26 ENCOUNTER — Ambulatory Visit: Payer: Self-pay | Admitting: Internal Medicine

## 2014-02-26 ENCOUNTER — Other Ambulatory Visit: Payer: Self-pay

## 2014-05-08 ENCOUNTER — Ambulatory Visit: Payer: Self-pay | Admitting: Internal Medicine

## 2014-05-27 ENCOUNTER — Other Ambulatory Visit: Payer: Self-pay | Admitting: Obstetrics & Gynecology

## 2014-05-27 DIAGNOSIS — Z30013 Encounter for initial prescription of injectable contraceptive: Secondary | ICD-10-CM

## 2014-06-10 ENCOUNTER — Ambulatory Visit: Payer: Self-pay | Attending: Internal Medicine | Admitting: Internal Medicine

## 2014-06-10 ENCOUNTER — Encounter: Payer: Self-pay | Admitting: Internal Medicine

## 2014-06-10 VITALS — BP 122/82 | HR 73 | Temp 98.4°F | Resp 17 | Wt 178.6 lb

## 2014-06-10 DIAGNOSIS — E039 Hypothyroidism, unspecified: Secondary | ICD-10-CM | POA: Insufficient documentation

## 2014-06-10 DIAGNOSIS — Z9884 Bariatric surgery status: Secondary | ICD-10-CM | POA: Insufficient documentation

## 2014-06-10 DIAGNOSIS — I10 Essential (primary) hypertension: Secondary | ICD-10-CM | POA: Insufficient documentation

## 2014-06-10 DIAGNOSIS — G8929 Other chronic pain: Secondary | ICD-10-CM | POA: Insufficient documentation

## 2014-06-10 DIAGNOSIS — M545 Low back pain, unspecified: Secondary | ICD-10-CM | POA: Insufficient documentation

## 2014-06-10 DIAGNOSIS — Z79899 Other long term (current) drug therapy: Secondary | ICD-10-CM | POA: Insufficient documentation

## 2014-06-10 DIAGNOSIS — K219 Gastro-esophageal reflux disease without esophagitis: Secondary | ICD-10-CM | POA: Insufficient documentation

## 2014-06-10 DIAGNOSIS — Z139 Encounter for screening, unspecified: Secondary | ICD-10-CM

## 2014-06-10 LAB — LIPID PANEL
Cholesterol: 245 mg/dL — ABNORMAL HIGH (ref 0–200)
HDL: 77 mg/dL (ref >39)
LDL CALC: 148 mg/dL — AB (ref 0–99)
TRIGLYCERIDES: 101 mg/dL (ref <150)
Total CHOL/HDL Ratio: 3.2 Ratio
VLDL: 20 mg/dL (ref 0–40)

## 2014-06-10 LAB — COMPLETE METABOLIC PANEL WITH GFR
ALBUMIN: 4 g/dL (ref 3.5–5.2)
ALT: 28 U/L (ref 0–35)
AST: 26 U/L (ref 0–37)
Alkaline Phosphatase: 45 U/L (ref 39–117)
BUN: 6 mg/dL (ref 6–23)
CALCIUM: 9.1 mg/dL (ref 8.4–10.5)
CO2: 31 meq/L (ref 19–32)
CREATININE: 1 mg/dL (ref 0.50–1.10)
Chloride: 106 mEq/L (ref 96–112)
GFR, Est African American: 75 mL/min
GFR, Est Non African American: 65 mL/min
Glucose, Bld: 86 mg/dL (ref 70–99)
POTASSIUM: 3.8 meq/L (ref 3.5–5.3)
Sodium: 142 mEq/L (ref 135–145)
TOTAL PROTEIN: 6.1 g/dL (ref 6.0–8.3)
Total Bilirubin: 0.5 mg/dL (ref 0.2–1.2)

## 2014-06-10 MED ORDER — HYDROCHLOROTHIAZIDE 12.5 MG PO TABS: 12.5000 mg | ORAL_TABLET | Freq: Every day | ORAL | Status: DC

## 2014-06-10 MED ORDER — AMLODIPINE BESYLATE 10 MG PO TABS: 10.0000 mg | ORAL_TABLET | Freq: Every day | ORAL | Status: DC

## 2014-06-10 NOTE — Progress Notes (Signed)
MRN: [id number] Name: Julie Miranda  Sex: female Age: 53 y.o. DOB: [birth year]  Allergies: Morphine and related  Chief Complaint  Patient presents with  . Back Pain    HPI: Patient is 53 y.o. female who has to of hypertension hypothyroidism chronic low back pain comes today for followup her, she has been taking her blood pressure medications and it is well controlled, she denies any headache dizziness chest and shortness of breath, for hypothyroidism she has been taking levothyroxine 50 mcg daily, she denies any change in bowel habits does complain of chronic low back pain and she follows with her sports medicine where she was prescribed tramadol.  Past Medical History  Diagnosis Date  . Thyroid disease   . Hypertension   . Gastric ulcer   . GERD (gastroesophageal reflux disease)     Past Surgical History  Procedure Laterality Date  . Gastric bypass        Medication List       This list is accurate as of: 06/10/14 10:13 AM.  Always use your most recent med list.               amLODipine 10 MG tablet  Commonly known as:  NORVASC  Take 1 tablet (10 mg total) by mouth daily.     APAP 325 MG tablet  Commonly known as:  TYLENOL  Take 2 tablets (650 mg total) by mouth every 12 (twelve) hours as needed for headache.     DSS 100 MG Caps  Take 100 mg by mouth 2 (two) times daily.     hydrochlorothiazide 12.5 MG tablet  Commonly known as:  HYDRODIURIL  Take 1 tablet (12.5 mg total) by mouth daily.     ICY HOT EX  Apply 1 application topically 2 (two) times daily as needed. por pain/spasms     levothyroxine 50 MCG tablet  Commonly known as:  SYNTHROID, LEVOTHROID  Take 1 tablet (50 mcg total) by mouth daily.     meclizine 25 MG tablet  Commonly known as:  ANTIVERT  Take 1 tablet (25 mg total) by mouth 3 (three) times daily as needed.     medroxyPROGESTERone 150 MG/ML injection  Commonly known as:  DEPO-PROVERA  INJECT 1ML INTRAMUSCULARLY EVERY 3 MONTHS     metroNIDAZOLE 250 MG tablet  Commonly known as:  FLAGYL  Take 1 tablet (250 mg total) by mouth 3 (three) times daily.     ondansetron 4 MG tablet  Commonly known as:  ZOFRAN  Take 1 tablet (4 mg total) by mouth every 8 (eight) hours as needed for nausea.     pantoprazole 40 MG tablet  Commonly known as:  PROTONIX  Take 1 tablet (40 mg total) by mouth daily.     ranitidine 150 MG tablet  Commonly known as:  ZANTAC  Take 1 tablet (150 mg total) by mouth 2 (two) times daily.     traMADol 50 MG tablet  Commonly known as:  ULTRAM  Take 1 tablet (50 mg total) by mouth every 8 (eight) hours as needed.        Meds ordered this encounter  Medications  . amLODipine (NORVASC) 10 MG tablet    Sig: Take 1 tablet (10 mg total) by mouth daily.    Dispense:  90 tablet    Refill:  3  . hydrochlorothiazide (HYDRODIURIL) 12.5 MG tablet    Sig: Take 1 tablet (12.5 mg total) by mouth daily.    Dispense:  90  tablet    Refill:  3     There is no immunization history on file for this patient.  Family History  Problem Relation Age of Onset  . Colon cancer Neg Hx   . Diabetes Mother   . Diabetes Maternal Aunt     x 2  . Heart disease Sister   . Liver cancer Maternal Aunt   . Kidney disease Neg Hx     History  Substance Use Topics  . Smoking status: Never Smoker   . Smokeless tobacco: Never Used  . Alcohol Use: No    Review of Systems   As noted in HPI  Filed Vitals:   06/10/14 0955  BP: 122/82  Pulse: 73  Temp: 98.4 F (36.9 C)  Resp: 17    Physical Exam  Physical Exam  Constitutional: No distress.  Eyes: EOM are normal. Pupils are equal, round, and reactive to light.  Cardiovascular: Normal rate and regular rhythm.   Pulmonary/Chest: Breath sounds normal. No respiratory distress. She has no wheezes. She has no rales.    CBC    Component Value Date/Time   WBC 2.7* 11/29/2013 0815   WBC 2.6* 11/09/2013 0705   RBC 3.87 11/29/2013 0815   RBC 4.16 11/09/2013 0705    HGB 10.8* 11/29/2013 0815   HGB 11.4* 11/09/2013 0705   HCT 33.3* 11/29/2013 0815   HCT 35.0* 11/09/2013 0705   PLT 292 11/29/2013 0815   PLT 275 11/09/2013 0705   MCV 86.0 11/29/2013 0815   MCV 84.1 11/09/2013 0705   LYMPHSABS 1.1 11/29/2013 0815   LYMPHSABS 1.0 09/05/2013 0948   MONOABS 0.3 11/29/2013 0815   MONOABS 0.2 09/05/2013 0948   EOSABS 0.1 11/29/2013 0815   EOSABS 0.1 09/05/2013 0948   BASOSABS 0.0 11/29/2013 0815   BASOSABS 0.0 09/05/2013 0948    CMP     Component Value Date/Time   NA 140 09/05/2013 1017   K 3.9 09/05/2013 1017   CL 104 09/05/2013 1017   CO2 26 09/05/2013 1017   GLUCOSE 81 09/05/2013 1017   BUN 9 09/05/2013 1017   CREATININE 0.96 09/05/2013 1017   CREATININE 1.09 06/29/2013 0547   CALCIUM 10.0 09/05/2013 1017   PROT 7.4 09/05/2013 1017   ALBUMIN 4.6 09/05/2013 1017   AST 16 09/05/2013 1017   ALT 10 09/05/2013 1017   ALKPHOS 75 09/05/2013 1017   BILITOT 0.6 09/05/2013 1017   GFRNONAA 69 09/05/2013 1017   GFRNONAA 58* 06/29/2013 0547   GFRAA 79 09/05/2013 1017   GFRAA 67* 06/29/2013 0547    No results found for this basename: chol, tri, ldl    No components found with this basename: hga1c    Lab Results  Component Value Date/Time   AST 16 09/05/2013 10:17 AM    Assessment and Plan  Essential hypertension, benign - Plan: Continue with her amLODipine (NORVASC) 10 MG tablet, hydrochlorothiazide (HYDRODIURIL) 12.5 MG tablet, will repeat blood chemistry COMPLETE METABOLIC PANEL WITH GFR  Unspecified hypothyroidism - Plan: Currently patient is on levothyroxine 50 mcg daily, recheck TSH level  Chronic low back pain Patient is not taking her pain medication today, she takes tramadol when necessary and following up with her sports medicine.  Screening - Plan: Lipid panel   Health Maintenance -Colonoscopy: uptodate   -Mammogram: uptodate    Return in about 3 months (around 09/10/2014) for hypertension, hypothyroid.  Lorayne Marek, MD

## 2014-06-10 NOTE — Progress Notes (Signed)
Patient here for follow up on her thyroid disease Would like her medication adjusted-states the levothyroxine is not working Complains of back pain and hands and feet have been swelling

## 2014-06-11 ENCOUNTER — Telehealth: Payer: Self-pay

## 2014-06-11 LAB — TSH: TSH: 17.352 u[IU]/mL — AB (ref 0.350–4.500)

## 2014-06-11 MED ORDER — LEVOTHYROXINE SODIUM 88 MCG PO TABS: 88.0000 ug | ORAL_TABLET | Freq: Every day | ORAL | Status: DC

## 2014-06-11 NOTE — Telephone Encounter (Signed)
Message copied by Dorothe Pea on Tue Jun 11, 2014 10:54 AM ------      Message from: Lorayne Marek      Created: Tue Jun 11, 2014  9:18 AM       Blood work reviewed, call and let the patient know that her TSH level is a still abnormal, advise patient to increase the dose of levothyroxine to 88 mcg daily, will repeat TSH level on the next visit. Also her cholesterol is elevated, advise patient for low fat diet. ------

## 2014-06-11 NOTE — Telephone Encounter (Signed)
Patient is aware of her lab results Prescription for thyroid medication sent to [facility] on file

## 2014-08-01 ENCOUNTER — Other Ambulatory Visit: Payer: Self-pay | Admitting: Sports Medicine

## 2014-11-21 ENCOUNTER — Telehealth: Payer: Self-pay | Admitting: Internal Medicine

## 2014-11-27 ENCOUNTER — Ambulatory Visit: Payer: Self-pay | Attending: Internal Medicine | Admitting: Internal Medicine

## 2014-11-27 ENCOUNTER — Encounter: Payer: Self-pay | Admitting: Internal Medicine

## 2014-11-27 VITALS — BP 132/91 | HR 92 | Temp 98.0°F | Resp 16 | Wt 186.4 lb

## 2014-11-27 DIAGNOSIS — E785 Hyperlipidemia, unspecified: Secondary | ICD-10-CM | POA: Insufficient documentation

## 2014-11-27 DIAGNOSIS — Z833 Family history of diabetes mellitus: Secondary | ICD-10-CM | POA: Insufficient documentation

## 2014-11-27 DIAGNOSIS — I10 Essential (primary) hypertension: Secondary | ICD-10-CM | POA: Insufficient documentation

## 2014-11-27 DIAGNOSIS — Z79899 Other long term (current) drug therapy: Secondary | ICD-10-CM | POA: Insufficient documentation

## 2014-11-27 DIAGNOSIS — K219 Gastro-esophageal reflux disease without esophagitis: Secondary | ICD-10-CM | POA: Insufficient documentation

## 2014-11-27 DIAGNOSIS — E039 Hypothyroidism, unspecified: Secondary | ICD-10-CM | POA: Insufficient documentation

## 2014-11-27 DIAGNOSIS — Z23 Encounter for immunization: Secondary | ICD-10-CM | POA: Insufficient documentation

## 2014-11-27 LAB — HEMOGLOBIN A1C
HEMOGLOBIN A1C: 5.2 % (ref <5.7)
MEAN PLASMA GLUCOSE: 103 mg/dL (ref <117)

## 2014-11-27 LAB — LIPID PANEL
Cholesterol: 261 mg/dL — ABNORMAL HIGH (ref 0–200)
HDL: 87 mg/dL (ref >39)
LDL Cholesterol: 146 mg/dL — ABNORMAL HIGH (ref 0–99)
TRIGLYCERIDES: 138 mg/dL (ref <150)
Total CHOL/HDL Ratio: 3 Ratio
VLDL: 28 mg/dL (ref 0–40)

## 2014-11-27 LAB — COMPLETE METABOLIC PANEL WITH GFR
ALT: 17 U/L (ref 0–35)
AST: 24 U/L (ref 0–37)
Albumin: 3.9 g/dL (ref 3.5–5.2)
Alkaline Phosphatase: 60 U/L (ref 39–117)
BUN: 7 mg/dL (ref 6–23)
CO2: 30 mEq/L (ref 19–32)
CREATININE: 0.96 mg/dL (ref 0.50–1.10)
Calcium: 9.9 mg/dL (ref 8.4–10.5)
Chloride: 104 mEq/L (ref 96–112)
GFR, EST NON AFRICAN AMERICAN: 68 mL/min
GFR, Est African American: 79 mL/min
GLUCOSE: 81 mg/dL (ref 70–99)
Potassium: 4.5 mEq/L (ref 3.5–5.3)
Sodium: 142 mEq/L (ref 135–145)
Total Bilirubin: 0.6 mg/dL (ref 0.2–1.2)
Total Protein: 6.8 g/dL (ref 6.0–8.3)

## 2014-11-27 MED ORDER — PANTOPRAZOLE SODIUM 40 MG PO TBEC: 40.0000 mg | DELAYED_RELEASE_TABLET | Freq: Every day | ORAL | Status: DC

## 2014-11-27 MED ORDER — ONDANSETRON HCL 4 MG PO TABS: 4.0000 mg | ORAL_TABLET | Freq: Three times a day (TID) | ORAL | Status: DC | PRN

## 2014-11-27 MED ORDER — RANITIDINE HCL 150 MG PO TABS: 150.0000 mg | ORAL_TABLET | Freq: Every day | ORAL | Status: DC

## 2014-11-27 NOTE — Patient Instructions (Signed)
DASH Eating Plan °DASH stands for "Dietary Approaches to Stop Hypertension." The DASH eating plan is a healthy eating plan that has been shown to reduce high blood pressure (hypertension). Additional health benefits may include reducing the risk of type 2 diabetes mellitus, heart disease, and stroke. The DASH eating plan may also help with weight loss. °WHAT DO I NEED TO KNOW ABOUT THE DASH EATING PLAN? °For the DASH eating plan, you will follow these general guidelines: °· Choose foods with a percent daily value for sodium of less than 5% (as listed on the food label). °· Use salt-free seasonings or herbs instead of table salt or sea salt. °· Check with your health care provider or pharmacist before using salt substitutes. °· Eat lower-sodium products, often labeled as "lower sodium" or "no salt added." °· Eat fresh foods. °· Eat more vegetables, fruits, and low-fat dairy products. °· Choose whole grains. Look for the word "whole" as the first word in the ingredient list. °· Choose fish and skinless chicken or turkey more often than red meat. Limit fish, poultry, and meat to 6 oz (170 g) each day. °· Limit sweets, desserts, sugars, and sugary drinks. °· Choose heart-healthy fats. °· Limit cheese to 1 oz (28 g) per day. °· Eat more home-cooked food and less restaurant, buffet, and fast food. °· Limit fried foods. °· Cook foods using methods other than frying. °· Limit canned vegetables. If you do use them, rinse them well to decrease the sodium. °· When eating at a restaurant, ask that your food be prepared with less salt, or no salt if possible. °WHAT FOODS CAN I EAT? °Seek help from a dietitian for individual calorie needs. °Grains °Whole grain or whole wheat bread. Brown rice. Whole grain or whole wheat pasta. Quinoa, bulgur, and whole grain cereals. Low-sodium cereals. Corn or whole wheat flour tortillas. Whole grain cornbread. Whole grain crackers. Low-sodium crackers. °Vegetables °Fresh or frozen vegetables  (raw, steamed, roasted, or grilled). Low-sodium or reduced-sodium tomato and vegetable juices. Low-sodium or reduced-sodium tomato sauce and paste. Low-sodium or reduced-sodium canned vegetables.  °Fruits °All fresh, canned (in natural juice), or frozen fruits. °Meat and Other Protein Products °Ground beef (85% or leaner), grass-fed beef, or beef trimmed of fat. Skinless chicken or turkey. Ground chicken or turkey. Pork trimmed of fat. All fish and seafood. Eggs. Dried beans, peas, or lentils. Unsalted nuts and seeds. Unsalted canned beans. °Dairy °Low-fat dairy products, such as skim or 1% milk, 2% or reduced-fat cheeses, low-fat ricotta or cottage cheese, or plain low-fat yogurt. Low-sodium or reduced-sodium cheeses. °Fats and Oils °Tub margarines without trans fats. Light or reduced-fat mayonnaise and salad dressings (reduced sodium). Avocado. Safflower, olive, or canola oils. Natural peanut or almond butter. °Other °Unsalted popcorn and pretzels. °The items listed above may not be a complete list of recommended foods or beverages. Contact your dietitian for more options. °WHAT FOODS ARE NOT RECOMMENDED? °Grains °White bread. White pasta. White rice. Refined cornbread. Bagels and croissants. Crackers that contain trans fat. °Vegetables °Creamed or fried vegetables. Vegetables in a cheese sauce. Regular canned vegetables. Regular canned tomato sauce and paste. Regular tomato and vegetable juices. °Fruits °Dried fruits. Canned fruit in light or heavy syrup. Fruit juice. °Meat and Other Protein Products °Fatty cuts of meat. Ribs, chicken wings, bacon, sausage, bologna, salami, chitterlings, fatback, hot dogs, bratwurst, and packaged luncheon meats. Salted nuts and seeds. Canned beans with salt. °Dairy °Whole or 2% milk, cream, half-and-half, and cream cheese. Whole-fat or sweetened yogurt. Full-fat   cheeses or blue cheese. Nondairy creamers and whipped toppings. Processed cheese, cheese spreads, or cheese  curds. °Condiments °Onion and garlic salt, seasoned salt, table salt, and sea salt. Canned and packaged gravies. Worcestershire sauce. Tartar sauce. Barbecue sauce. Teriyaki sauce. Soy sauce, including reduced sodium. Steak sauce. Fish sauce. Oyster sauce. Cocktail sauce. Horseradish. Ketchup and mustard. Meat flavorings and tenderizers. Bouillon cubes. Hot sauce. Tabasco sauce. Marinades. Taco seasonings. Relishes. °Fats and Oils °Butter, stick margarine, lard, shortening, ghee, and bacon fat. Coconut, palm kernel, or palm oils. Regular salad dressings. °Other °Pickles and olives. Salted popcorn and pretzels. °The items listed above may not be a complete list of foods and beverages to avoid. Contact your dietitian for more information. °WHERE CAN I FIND MORE INFORMATION? °National [employer], [employer], [employer]: www.nhlbi.nih.gov/health/health-topics/topics/dash/ °Document Released: 09/30/2011 Document Revised: 02/25/2014 Document Reviewed: 08/15/2013 °[employer]® Patient Information ©2015 [employer]. This information is not intended to replace advice given to you by your health care provider. Make sure you discuss any questions you have with your health care provider. ° °

## 2014-11-27 NOTE — Progress Notes (Signed)
Patient here for follow up on her HTN and thyroid disease Also requesting refills on her medications

## 2014-11-27 NOTE — Progress Notes (Signed)
MRN: [id number] Name: Julie Miranda  Sex: female Age: 54 y.o. DOB: 04-27-1961  Allergies: Morphine and related  Chief Complaint  Patient presents with  . Follow-up    HPI: Patient is 54 y.o. female who history of hypertension, hypothyroidism, GERD  Comes today for followup requesting refill on her medications, her blood pressure is borderline elevated, she has not taken her blood pressure medication today, she denies any acute symptoms denies any headache dizziness chest and shortness of breath, she is requesting flu shot today.  patient is up-to-date with a colonoscopy, she is going to schedule mammogram.  Past Medical History  Diagnosis Date  . Thyroid disease   . Hypertension   . Gastric ulcer   . GERD (gastroesophageal reflux disease)     Past Surgical History  Procedure Laterality Date  . Gastric bypass        Medication List       This list is accurate as of: 11/27/14  9:46 AM.  Always use your most recent med list.               amLODipine 10 MG tablet  Commonly known as:  NORVASC  Take 1 tablet (10 mg total) by mouth daily.     APAP 325 MG tablet  Commonly known as:  TYLENOL  Take 2 tablets (650 mg total) by mouth every 12 (twelve) hours as needed for headache.     DSS 100 MG Caps  Take 100 mg by mouth 2 (two) times daily.     hydrochlorothiazide 12.5 MG tablet  Commonly known as:  HYDRODIURIL  Take 1 tablet (12.5 mg total) by mouth daily.     ICY HOT EX  Apply 1 application topically 2 (two) times daily as needed. por pain/spasms     levothyroxine 88 MCG tablet  Commonly known as:  SYNTHROID, LEVOTHROID  Take 1 tablet (88 mcg total) by mouth daily.     meclizine 25 MG tablet  Commonly known as:  ANTIVERT  Take 1 tablet (25 mg total) by mouth 3 (three) times daily as needed.     medroxyPROGESTERone 150 MG/ML injection  Commonly known as:  DEPO-PROVERA  INJECT 1ML INTRAMUSCULARLY EVERY 3 MONTHS     metroNIDAZOLE 250 MG tablet  Commonly  known as:  FLAGYL  Take 1 tablet (250 mg total) by mouth 3 (three) times daily.     ondansetron 4 MG tablet  Commonly known as:  ZOFRAN  Take 1 tablet (4 mg total) by mouth every 8 (eight) hours as needed for nausea.     pantoprazole 40 MG tablet  Commonly known as:  PROTONIX  Take 1 tablet (40 mg total) by mouth daily.     ranitidine 150 MG tablet  Commonly known as:  ZANTAC  Take 1 tablet (150 mg total) by mouth at bedtime.     traMADol 50 MG tablet  Commonly known as:  ULTRAM  TAKE ONE TABLET BY MOUTH EVERY 8 HOURS AS NEEDED        Meds ordered this encounter  Medications  . ranitidine (ZANTAC) 150 MG tablet    Sig: Take 1 tablet (150 mg total) by mouth at bedtime.    Dispense:  30 tablet    Refill:  5  . pantoprazole (PROTONIX) 40 MG tablet    Sig: Take 1 tablet (40 mg total) by mouth daily.    Dispense:  90 tablet    Refill:  3  . ondansetron (ZOFRAN) 4 MG tablet  Sig: Take 1 tablet (4 mg total) by mouth every 8 (eight) hours as needed for nausea.    Dispense:  20 tablet    Refill:  0    Immunization History  Administered Date(s) Administered  . Influenza,inj,Quad PF,36+ Mos 11/27/2014    Family History  Problem Relation Age of Onset  . Colon cancer Neg Hx   . Diabetes Mother   . Diabetes Maternal Aunt     x 2  . Heart disease Sister   . Liver cancer Maternal Aunt   . Kidney disease Neg Hx     History  Substance Use Topics  . Smoking status: Never Smoker   . Smokeless tobacco: Never Used  . Alcohol Use: No    Review of Systems   As noted in HPI  Filed Vitals:   11/27/14 0921  BP: 132/91  Pulse: 92  Temp: 98 F (36.7 C)  Resp: 16    Physical Exam  Physical Exam  Constitutional: No distress.  Eyes: EOM are normal. Pupils are equal, round, and reactive to light.  Cardiovascular: Normal rate and regular rhythm.   Pulmonary/Chest: Breath sounds normal. No respiratory distress. She has no wheezes. She has no rales.  Musculoskeletal:  She exhibits no edema.    CBC    Component Value Date/Time   WBC 2.7* 11/29/2013 0815   WBC 2.6* 11/09/2013 0705   RBC 3.87 11/29/2013 0815   RBC 4.16 11/09/2013 0705   HGB 10.8* 11/29/2013 0815   HGB 11.4* 11/09/2013 0705   HCT 33.3* 11/29/2013 0815   HCT 35.0* 11/09/2013 0705   PLT 292 11/29/2013 0815   PLT 275 11/09/2013 0705   MCV 86.0 11/29/2013 0815   MCV 84.1 11/09/2013 0705   LYMPHSABS 1.1 11/29/2013 0815   LYMPHSABS 1.0 09/05/2013 0948   MONOABS 0.3 11/29/2013 0815   MONOABS 0.2 09/05/2013 0948   EOSABS 0.1 11/29/2013 0815   EOSABS 0.1 09/05/2013 0948   BASOSABS 0.0 11/29/2013 0815   BASOSABS 0.0 09/05/2013 0948    CMP     Component Value Date/Time   NA 142 06/10/2014 1015   K 3.8 06/10/2014 1015   CL 106 06/10/2014 1015   CO2 31 06/10/2014 1015   GLUCOSE 86 06/10/2014 1015   BUN 6 06/10/2014 1015   CREATININE 1.00 06/10/2014 1015   CREATININE 1.09 06/29/2013 0547   CALCIUM 9.1 06/10/2014 1015   PROT 6.1 06/10/2014 1015   ALBUMIN 4.0 06/10/2014 1015   AST 26 06/10/2014 1015   ALT 28 06/10/2014 1015   ALKPHOS 45 06/10/2014 1015   BILITOT 0.5 06/10/2014 1015   GFRNONAA 65 06/10/2014 1015   GFRNONAA 58* 06/29/2013 0547   GFRAA 75 06/10/2014 1015   GFRAA 67* 06/29/2013 0547    Lab Results  Component Value Date/Time   CHOL 245* 06/10/2014 10:15 AM    No components found for: HGA1C  Lab Results  Component Value Date/Time   AST 26 06/10/2014 10:15 AM    Assessment and Plan  Hypothyroidism, unspecified hypothyroidism type - Plan:will repeat her TSH level, currently patient is taking levothyroxine 88 mcg daily  Essential hypertension, benign - Plan: currently patient is on Norvasc and hydrochlorothiazide, will check blood chemistry COMPLETE METABOLIC PANEL WITH GFR   Gastroesophageal reflux disease, esophagitis presence not specified - Plan: lifestyle modification, medication refill done ranitidine (ZANTAC) 150 MG tablet, pantoprazole (PROTONIX)  40 MG tablet  Hyperlipidemia Advised patient for low fat diet, will repeat her lipid panel.  Family history of diabetes  mellitus (DM) - Plan:We'll check her Hemoglobin A1c   Health Maintenance -Colonoscopy: uptodate   -Mammogram: patient will schedule  -Vaccinations:  Flu shot given today   Return in about 3 months (around 02/25/2015) for hypertension, hypothyroid.  Lorayne Marek, MD

## 2014-11-28 LAB — TSH: TSH: 11.768 u[IU]/mL — ABNORMAL HIGH (ref 0.350–4.500)

## 2014-11-29 ENCOUNTER — Telehealth: Payer: Self-pay

## 2014-11-29 MED ORDER — LEVOTHYROXINE SODIUM 100 MCG PO TABS: 100.0000 ug | ORAL_TABLET | Freq: Every day | ORAL | Status: DC

## 2014-11-29 NOTE — Telephone Encounter (Signed)
Pt aware of lab results and changes on Rx

## 2014-11-29 NOTE — Telephone Encounter (Signed)
-----   Message from Lorayne Marek, MD sent at 11/28/2014  9:09 AM EST ----- Blood work reviewed her TSH level is still abnormal, currently patient is taking levothyroxine 88 mcg, advise patient to increase the dose to 100 mcg daily, will repeat the test on the following visit. Also noticed elevated cholesterol, advise patient for low fat diet.

## 2014-11-29 NOTE — Telephone Encounter (Signed)
LVM to return call.

## 2015-04-23 ENCOUNTER — Other Ambulatory Visit: Payer: Self-pay

## 2015-04-23 MED ORDER — LEVOTHYROXINE SODIUM 100 MCG PO TABS: 100.0000 ug | ORAL_TABLET | Freq: Every day | ORAL | Status: DC

## 2015-05-31 ENCOUNTER — Other Ambulatory Visit: Payer: Self-pay | Admitting: Obstetrics & Gynecology

## 2015-05-31 DIAGNOSIS — N924 Excessive bleeding in the premenopausal period: Secondary | ICD-10-CM

## 2015-06-25 ENCOUNTER — Other Ambulatory Visit: Payer: Self-pay | Admitting: Internal Medicine

## 2015-06-25 ENCOUNTER — Ambulatory Visit: Payer: Self-pay | Attending: Family Medicine | Admitting: Physician Assistant

## 2015-06-25 VITALS — BP 134/85 | HR 69 | Temp 97.8°F | Resp 18 | Ht 65.0 in | Wt 196.0 lb

## 2015-06-25 DIAGNOSIS — Z9884 Bariatric surgery status: Secondary | ICD-10-CM | POA: Insufficient documentation

## 2015-06-25 DIAGNOSIS — I10 Essential (primary) hypertension: Secondary | ICD-10-CM | POA: Insufficient documentation

## 2015-06-25 DIAGNOSIS — K219 Gastro-esophageal reflux disease without esophagitis: Secondary | ICD-10-CM

## 2015-06-25 DIAGNOSIS — Z79899 Other long term (current) drug therapy: Secondary | ICD-10-CM | POA: Insufficient documentation

## 2015-06-25 DIAGNOSIS — E039 Hypothyroidism, unspecified: Secondary | ICD-10-CM | POA: Insufficient documentation

## 2015-06-25 DIAGNOSIS — M25532 Pain in left wrist: Secondary | ICD-10-CM | POA: Insufficient documentation

## 2015-06-25 DIAGNOSIS — M25531 Pain in right wrist: Secondary | ICD-10-CM | POA: Insufficient documentation

## 2015-06-25 LAB — RHEUMATOID FACTOR

## 2015-06-25 LAB — SEDIMENTATION RATE: Sed Rate: 10 mm/hr (ref 0–30)

## 2015-06-25 MED ORDER — PANTOPRAZOLE SODIUM 40 MG PO TBEC: 40.0000 mg | DELAYED_RELEASE_TABLET | Freq: Every day | ORAL | Status: DC

## 2015-06-25 MED ORDER — AMLODIPINE BESYLATE 10 MG PO TABS: 10.0000 mg | ORAL_TABLET | Freq: Every day | ORAL | Status: DC

## 2015-06-25 MED ORDER — TRAMADOL HCL 50 MG PO TABS: 50.0000 mg | ORAL_TABLET | Freq: Three times a day (TID) | ORAL | Status: DC | PRN

## 2015-06-25 MED ORDER — RANITIDINE HCL 150 MG PO TABS: 150.0000 mg | ORAL_TABLET | Freq: Every day | ORAL | Status: DC

## 2015-06-25 MED ORDER — HYDROCHLOROTHIAZIDE 12.5 MG PO TABS: 12.5000 mg | ORAL_TABLET | Freq: Every day | ORAL | Status: DC

## 2015-06-25 MED ORDER — LEVOTHYROXINE SODIUM 100 MCG PO TABS: 100.0000 ug | ORAL_TABLET | Freq: Every day | ORAL | Status: DC

## 2015-06-25 NOTE — Progress Notes (Signed)
Chief Complaint: "Basically my wrists hurt but I need refills on multiple medications too"  Subjective: 54 year old Afro-American female with a history of hypertension, hypothyroidism and gastric bypass surgery who was previously was followed by Dr. Annitta Needs. She is out of medications for her blood pressure, her fluid pill, and her indigestion.  She also states that she's had a lot of trouble from bilateral hands and wrists. On the left she states that she fell a couple weeks ago sustaining injury to the left wrist and suffered a lot of swelling. This is actually improving. She cannot explain the discomfort in her right wrist. There was no injury there. She's had a lot of swelling and difficulty closing her hand all the way. She's had some numbness and tingling in that area as well. She's had no injury to her back or neck. She's been taking some aspirin with temporary relief at home. She is currently unemployed. She does not feel that she has carpal tunnel as she has had this before.   ROS:  GEN: denies fever or chills, denies change in weight LUNGS: denies SHOB, dyspnea, PND, orthopnea CV: denies CP or palpitations EXT: + muscle spasms or swelling; no pain in lower ext, no weakness NEURO: + numbness or tingling, denies sz, stroke or TIA   Objective:  Filed Vitals:   06/25/15 1102 06/25/15 1109 06/25/15 1118  BP:  144/90 134/85  Pulse:  69   Temp:  97.8 F (36.6 C)   TempSrc:  Oral   Resp:  18   Height: 5' 5" (1.651 m)    Weight: 196 lb (88.905 kg)    SpO2:  97%     Physical Exam:  General: in no acute distress. Heart: Normal  s1 &s2  Regular rate and rhythm, without murmurs, rubs, gallops. Lungs: Clear to auscultation bilaterally. Extremities: No clubbing or cyanosis,  minimal edema bilateral wrists, L>R with positive pedal pulses. Neuro: Alert, awake, oriented x3, nonfocal.   Medications: Prior to Admission medications   Medication Sig Start Date End Date Taking?  Authorizing Provider  APAP (TYLENOL) 325 MG tablet Take 2 tablets (650 mg total) by mouth every 12 (twelve) hours as needed for headache. 09/05/13  Yes Thurnell Lose, MD  docusate sodium 100 MG CAPS Take 100 mg by mouth 2 (two) times daily. 06/29/13  Yes Samella Parr, NP  levothyroxine (SYNTHROID) 100 MCG tablet Take 1 tablet (100 mcg total) by mouth daily before breakfast. 06/25/15  Yes Kanyon Bunn Daneil Dan, PA-C  meclizine (ANTIVERT) 25 MG tablet Take 1 tablet (25 mg total) by mouth 3 (three) times daily as needed. 09/05/13  Yes Thurnell Lose, MD  Menthol, Topical Analgesic, (ICY HOT EX) Apply 1 application topically 2 (two) times daily as needed. por pain/spasms   Yes Historical Provider, MD  traMADol (ULTRAM) 50 MG tablet Take 1 tablet (50 mg total) by mouth every 8 (eight) hours as needed. 06/25/15  Yes Madalee Altmann Daneil Dan, PA-C  amLODipine (NORVASC) 10 MG tablet Take 1 tablet (10 mg total) by mouth daily. 06/25/15   Lennex Pietila Daneil Dan, PA-C  hydrochlorothiazide (HYDRODIURIL) 12.5 MG tablet Take 1 tablet (12.5 mg total) by mouth daily. 06/25/15   Brayton Caves, PA-C  levothyroxine (SYNTHROID, LEVOTHROID) 88 MCG tablet Take 1 tablet (88 mcg total) by mouth daily. Patient not taking: Reported on 06/25/2015 06/11/14   Lorayne Marek, MD  medroxyPROGESTERone (DEPO-PROVERA) 150 MG/ML injection INJECT 1ML INTRAMUSCULARLY EVERY 3 MONTHS Patient not taking: Reported on 06/25/2015 06/01/15  Woodroe Mode, MD  metroNIDAZOLE (FLAGYL) 250 MG tablet Take 1 tablet (250 mg total) by mouth 3 (three) times daily. Patient not taking: Reported on 06/25/2015 11/16/13   Jerene Bears, MD  ondansetron (ZOFRAN) 4 MG tablet Take 1 tablet (4 mg total) by mouth every 8 (eight) hours as needed for nausea. Patient not taking: Reported on 06/25/2015 11/27/14   Lorayne Marek, MD  pantoprazole (PROTONIX) 40 MG tablet Take 1 tablet (40 mg total) by mouth daily. 06/25/15   Kouper Spinella Daneil Dan, PA-C  ranitidine (ZANTAC) 150 MG tablet Take 1 tablet (150  mg total) by mouth at bedtime. 06/25/15   Brayton Caves, PA-C    Assessment: 1. Bilateral wrist pain 2. HTN 3. Hypothyroidism   Plan: Check ANA, ESR, RF Tramadol and OTC Ibuprofen for pain and swelling May need Ortho or Rheum eval at some point, will await labs Wrist immobilizer prn Meds refilled   Follow up:4 weeks with PCP  The patient was given clear instructions to go to ER or return to medical center if symptoms don't improve, worsen or new problems develop. The patient verbalized understanding. The patient was told to call to get lab results if they haven't heard anything in the next week.   This note has been created with Surveyor, quantity. Any transcriptional errors are unintentional.   Zettie Pho, PA-C 06/25/2015, 12:17 PM

## 2015-06-25 NOTE — Progress Notes (Signed)
Patient wants thyroid rechecked and prescription refilled.  Patient fell 2 weeks ago, left wrist swelled and had pain. Right hand started hurting at tips, moved down into wrist and going up into arm. Patient can not grasp or close right hand. Has been using icy hot and "Stop pain" but neither are working.   Left wrist pain, at level 4, described as discomfort. Left wrist started hurting after fall.  Right hand pain, at level 10, described as burning. Has been having right hand pain since May.   Right foot has pain, at level 6, described as throbbing, on bottom middle of foot and heel.   Patient needs refills on all medications.

## 2015-06-26 LAB — ANA: Anti Nuclear Antibody(ANA): NEGATIVE

## 2015-07-01 ENCOUNTER — Telehealth: Payer: Self-pay | Admitting: General Practice

## 2015-07-01 NOTE — Telephone Encounter (Signed)
Patient presents to clinic to request medication refill for nausea and dizziness medications. Patient was just seen on 06/25/15. Please assist

## 2015-07-03 NOTE — Telephone Encounter (Signed)
Patient called to request medication refill for meclizine (ANTIVERT) 25 MG tablet and  ondansetron (ZOFRAN) 4 MG tablet. She also requested her blood lab results. Please f/u

## 2015-07-11 ENCOUNTER — Ambulatory Visit: Payer: Self-pay | Attending: Family Medicine | Admitting: Family Medicine

## 2015-07-11 ENCOUNTER — Encounter (HOSPITAL_BASED_OUTPATIENT_CLINIC_OR_DEPARTMENT_OTHER): Payer: Self-pay | Admitting: Clinical

## 2015-07-11 ENCOUNTER — Encounter: Payer: Self-pay | Admitting: Family Medicine

## 2015-07-11 VITALS — BP 130/89 | HR 75 | Temp 98.2°F | Ht 65.0 in | Wt 194.6 lb

## 2015-07-11 DIAGNOSIS — Z659 Problem related to unspecified psychosocial circumstances: Secondary | ICD-10-CM

## 2015-07-11 DIAGNOSIS — D709 Neutropenia, unspecified: Secondary | ICD-10-CM

## 2015-07-11 DIAGNOSIS — M1990 Unspecified osteoarthritis, unspecified site: Secondary | ICD-10-CM

## 2015-07-11 HISTORY — DX: Neutropenia, unspecified: D70.9

## 2015-07-11 MED ORDER — MELOXICAM 7.5 MG PO TABS: 7.5000 mg | ORAL_TABLET | Freq: Every day | ORAL | Status: DC

## 2015-07-11 MED ORDER — ONDANSETRON HCL 4 MG PO TABS: 4.0000 mg | ORAL_TABLET | Freq: Three times a day (TID) | ORAL | Status: DC | PRN

## 2015-07-11 MED ORDER — PREDNISONE 20 MG PO TABS: 20.0000 mg | ORAL_TABLET | Freq: Every day | ORAL | Status: DC

## 2015-07-11 MED ORDER — MECLIZINE HCL 25 MG PO TABS: 25.0000 mg | ORAL_TABLET | Freq: Three times a day (TID) | ORAL | Status: DC | PRN

## 2015-07-11 NOTE — Progress Notes (Signed)
Patient asking for refills on her meclizine and ondansetron  She states she has a low blood count which causes her nausea and dizziness She has had bilateral hand/wrist pain for years which is worsening She says she has test results she would like given to her that pertain to this She has one wrist brace that she wears and alternates between the two wrists She can no longer lift things or twist off a bottle cap She is anxious because she recently lost her job and she is looking for a new one but needs to be able to use her hands

## 2015-07-11 NOTE — Patient Instructions (Signed)
Arthritis, Nonspecific °Arthritis is inflammation of a joint. This usually means pain, redness, warmth or swelling are present. One or more joints may be involved. There are a number of types of arthritis. Your caregiver may not be able to tell what type of arthritis you have right away. °CAUSES  °The most common cause of arthritis is the wear and tear on the joint (osteoarthritis). This causes damage to the cartilage, which can break down over time. The knees, hips, back and neck are most often affected by this type of arthritis. °Other types of arthritis and common causes of joint pain include: °· Sprains and other injuries near the joint. Sometimes minor sprains and injuries cause pain and swelling that develop hours later. °· Rheumatoid arthritis. This affects hands, feet and knees. It usually affects both sides of your body at the same time. It is often associated with chronic ailments, fever, weight loss and general weakness. °· Crystal arthritis. Gout and pseudo gout can cause occasional acute severe pain, redness and swelling in the foot, ankle, or knee. °· Infectious arthritis. Bacteria can get into a joint through a break in overlying skin. This can cause infection of the joint. Bacteria and viruses can also spread through the blood and affect your joints. °· Drug, infectious and allergy reactions. Sometimes joints can become mildly painful and slightly swollen with these types of illnesses. °SYMPTOMS  °· Pain is the main symptom. °· Your joint or joints can also be red, swollen and warm or hot to the touch. °· You may have a fever with certain types of arthritis, or even feel overall ill. °· The joint with arthritis will hurt with movement. Stiffness is present with some types of arthritis. °DIAGNOSIS  °Your caregiver will suspect arthritis based on your description of your symptoms and on your exam. Testing may be needed to find the type of arthritis: °· Blood and sometimes urine tests. °· X-ray tests  and sometimes CT or MRI scans. °· Removal of fluid from the joint (arthrocentesis) is done to check for bacteria, crystals or other causes. Your caregiver (or a specialist) will numb the area over the joint with a local anesthetic, and use a needle to remove joint fluid for examination. This procedure is only minimally uncomfortable. °· Even with these tests, your caregiver may not be able to tell what kind of arthritis you have. Consultation with a specialist (rheumatologist) may be helpful. °TREATMENT  °Your caregiver will discuss with you treatment specific to your type of arthritis. If the specific type cannot be determined, then the following general recommendations may apply. °Treatment of severe joint pain includes: °· Rest. °· Elevation. °· Anti-inflammatory medication (for example, ibuprofen) may be prescribed. Avoiding activities that cause increased pain. °· Only take over-the-counter or prescription medicines for pain and discomfort as recommended by your caregiver. °· Cold packs over an inflamed joint may be used for 10 to 15 minutes every hour. Hot packs sometimes feel better, but do not use overnight. Do not use hot packs if you are diabetic without your caregiver's permission. °· A cortisone shot into arthritic joints may help reduce pain and swelling. °· Any acute arthritis that gets worse over the next 1 to 2 days needs to be looked at to be sure there is no joint infection. °Long-term arthritis treatment involves modifying activities and lifestyle to reduce joint stress jarring. This can include weight loss. Also, exercise is needed to nourish the joint cartilage and remove waste. This helps keep the muscles   around the joint strong. °HOME CARE INSTRUCTIONS  °· Do not take aspirin to relieve pain if gout is suspected. This elevates uric acid levels. °· Only take over-the-counter or prescription medicines for pain, discomfort or fever as directed by your caregiver. °· Rest the joint as much as  possible. °· If your joint is swollen, keep it elevated. °· Use crutches if the painful joint is in your leg. °· Drinking plenty of fluids may help for certain types of arthritis. °· Follow your caregiver's dietary instructions. °· Try low-impact exercise such as: °¨ Swimming. °¨ Water aerobics. °¨ Biking. °¨ Walking. °· Morning stiffness is often relieved by a warm shower. °· Put your joints through regular range-of-motion. °SEEK MEDICAL CARE IF:  °· You do not feel better in 24 hours or are getting worse. °· You have side effects to medications, or are not getting better with treatment. °SEEK IMMEDIATE MEDICAL CARE IF:  °· You have a fever. °· You develop severe joint pain, swelling or redness. °· Many joints are involved and become painful and swollen. °· There is severe back pain and/or leg weakness. °· You have loss of bowel or bladder control. °Document Released: 11/18/2004 Document Revised: 01/03/2012 Document Reviewed: 12/04/2008 °[employer]® Patient Information ©2015 [employer]. This information is not intended to replace advice given to you by your health care Julie Miranda. Make sure you discuss any questions you have with your health care Julie Miranda. ° °

## 2015-07-11 NOTE — Progress Notes (Signed)
ASSESSMENT: Pt currently experiencing psychosocial problem; would benefit from psychoeducation and supportive counseling regarding coping with psychosocial circumstances.  Stage of Change: contemplative  PLAN: 1. F/U with behavioral health consultant in as needed 2. Psychiatric Medications: none. 3. Behavioral recommendation(s):   -Consider reading educational material regarding coping with symptoms of anxiety and depression  SUBJECTIVE: Pt. referred by Dr Jarold Song for mild symptoms of anxiety and depression:  Pt. reports the following symptoms/concerns: Pt denies having any anxiety or depression, other than feeling "a little anxious about the pain" in her wrists, as a result of her arthritis, and having recently lost her job; she is job-searching now.  Pt has never felt like she was depressed or had any anxiety in the past, outside of "normal, everyday" stress.  Duration of problem: 4 months Severity: mild  OBJECTIVE: Orientation & Cognition: Oriented x3. Thought processes normal and appropriate to situation. Mood:. Affect: appropriat Appearance: appropriate Risk of harm to self or others: no risk of harm to self or others Substance use: none Assessments administered: PHQ9: 6/ GAD7: 4  Diagnosis: Problem related to psychosocial circumstances CPT Code: Z65.9 -------------------------------------------- Other(s) present in the room: none  Time spent with patient in exam room: 10 minutes

## 2015-07-11 NOTE — Progress Notes (Signed)
CC: wrist pain  HPI: Julie Miranda is a 54 y.o. female here today for a follow up visit.  Patient has past medical history of hypertension, hypothyroidism, GERD, gastric ulcer and comes in here today for follow-up of bilateral wrist pain which have been going on for the last few months. Pain prevents her from making a fist and starts from the fingers extending to her wrist and prevents her from picking up things so she has to use her forearms. She occasionally has locking up of her fingers and cannot open jars. She endorses occasional tingling which occurs in her fifth finger. At last visit she had an ANA, rheumatoid sedimentation rate which was sent off and came back negative.  She is requesting a refill of meclizine and Zofran which she takes for nausea.  Patient has No headache, No chest pain, No abdominal pain, No Cough - SOB.  Allergies  Allergen Reactions  . Morphine And Related Nausea And Vomiting   Past Medical History  Diagnosis Date  . Thyroid disease   . Hypertension   . Gastric ulcer   . GERD (gastroesophageal reflux disease)    Current Outpatient Prescriptions on File Prior to Visit  Medication Sig Dispense Refill  . amLODipine (NORVASC) 10 MG tablet Take 1 tablet (10 mg total) by mouth daily. 90 tablet 3  . APAP (TYLENOL) 325 MG tablet Take 2 tablets (650 mg total) by mouth every 12 (twelve) hours as needed for headache. 30 tablet 1  . docusate sodium 100 MG CAPS Take 100 mg by mouth 2 (two) times daily. 10 capsule 0  . hydrochlorothiazide (HYDRODIURIL) 12.5 MG tablet Take 1 tablet (12.5 mg total) by mouth daily. 90 tablet 3  . levothyroxine (SYNTHROID) 100 MCG tablet Take 1 tablet (100 mcg total) by mouth daily before breakfast. 30 tablet 3  . pantoprazole (PROTONIX) 40 MG tablet Take 1 tablet (40 mg total) by mouth daily. 90 tablet 3  . ranitidine (ZANTAC) 150 MG tablet Take 1 tablet (150 mg total) by mouth at bedtime. 30 tablet 5  . traMADol (ULTRAM) 50 MG tablet  Take 1 tablet (50 mg total) by mouth every 8 (eight) hours as needed. 60 tablet 0  . medroxyPROGESTERone (DEPO-PROVERA) 150 MG/ML injection INJECT 1ML INTRAMUSCULARLY EVERY 3 MONTHS (Patient not taking: Reported on 06/25/2015) 1 mL 0  . Menthol, Topical Analgesic, (ICY HOT EX) Apply 1 application topically 2 (two) times daily as needed. por pain/spasms     No current facility-administered medications on file prior to visit.   Family History  Problem Relation Age of Onset  . Colon cancer Neg Hx   . Diabetes Mother   . Diabetes Maternal Aunt     x 2  . Heart disease Sister   . Liver cancer Maternal Aunt   . Kidney disease Neg Hx    Social History   Social History  . Marital Status: Single    Spouse Name: N/A  . Number of Children: N/A  . Years of Education: N/A   Occupational History  . Not on file.   Social History Main Topics  . Smoking status: Never Smoker   . Smokeless tobacco: Never Used  . Alcohol Use: No  . Drug Use: No  . Sexual Activity: Not Currently    Birth Control/ Protection: Injection   Other Topics Concern  . Not on file   Social History Narrative    Review of Systems: Constitutional: Negative for fever, chills, diaphoresis, activity change, appetite change and  fatigue. HENT: Negative for ear pain, nosebleeds, congestion, facial swelling, rhinorrhea, neck pain, neck stiffness and ear discharge.  Eyes: Negative for pain, discharge, redness, itching and visual disturbance. Respiratory: Negative for cough, choking, chest tightness, shortness of breath, wheezing and stridor.  Cardiovascular: Negative for chest pain, palpitations and leg swelling. Gastrointestinal: Negative for abdominal distention. Positive for nausea. Genitourinary: Negative for dysuria, urgency, frequency, hematuria, flank pain, decreased urine volume, difficulty urinating and dyspareunia.  Musculoskeletal: See history of present illness Neurological: Negative for dizziness, tremors,  seizures, syncope, facial asymmetry, speech difficulty, weakness, light-headedness, numbness and headaches.  Hematological: Negative for adenopathy. Does not bruise/bleed easily. Psychiatric/Behavioral: Negative for hallucinations, behavioral problems, confusion, dysphoric mood, decreased concentration and agitation.    Objective:   Filed Vitals:   07/11/15 1419  BP: 130/89  Pulse: 75  Temp: 98.2 F (36.8 C)    Physical Exam: Constitutional: Patient appears well-developed and well-nourished. No distress. Neck: Normal ROM. Neck supple. No JVD. No tracheal deviation. No thyromegaly. CVS: RRR, S1/S2 +, no murmurs, no gallops, no carotid bruit.  Pulmonary: Effort and breath sounds normal, no stridor, rhonchi, wheezes, rales.  Abdominal: Soft. BS +,  no distension, tenderness, rebound or guarding.  Musculoskeletal: Unable to make a fist bilaterally tenderness on palpation of the MCP joints and dorsum of her wrist bilaterally, no swelling appreciated.  Lymphadenopathy: No lymphadenopathy noted, cervical, inguinal or axillary Neuro: Alert. Normal reflexes, muscle tone coordination. No cranial nerve deficit. Skin: Skin is warm and dry. No rash noted. Not diaphoretic. No erythema. No pallor. Psychiatric: Normal mood and affect. Behavior, judgment, thought content normal.  Lab Results  Component Value Date   WBC 2.7* 11/29/2013   HGB 10.8* 11/29/2013   HCT 33.3* 11/29/2013   MCV 86.0 11/29/2013   PLT 292 11/29/2013   Lab Results  Component Value Date   CREATININE 0.96 11/27/2014   BUN 7 11/27/2014   NA 142 11/27/2014   K 4.5 11/27/2014   CL 104 11/27/2014   CO2 30 11/27/2014    Lab Results  Component Value Date   HGBA1C 5.2 11/27/2014   Lipid Panel     Component Value Date/Time   CHOL 261* 11/27/2014 1001   TRIG 138 11/27/2014 1001   HDL 87 11/27/2014 1001   CHOLHDL 3.0 11/27/2014 1001   VLDL 28 11/27/2014 1001   LDLCALC 146* 11/27/2014 1001       Assessment and  plan:   Osteoarthritis: Placed on short course prednisone and after that meloxicam Tramadol as needed. Wrist brace provided to patient. We'll reassess at next visit and refer to Orthopedics if need be.  Neutropenia: Managed by hematology.  Nausea: Refill Zofran  Arnoldo Morale, MD. [facility] [phone] 07/11/2015, 2:53 PM

## 2015-07-23 ENCOUNTER — Encounter: Payer: Self-pay | Admitting: Family Medicine

## 2015-07-23 ENCOUNTER — Ambulatory Visit: Payer: Self-pay | Attending: Family Medicine | Admitting: Family Medicine

## 2015-07-23 VITALS — BP 135/81 | HR 95 | Temp 98.3°F | Ht 65.0 in | Wt 196.2 lb

## 2015-07-23 DIAGNOSIS — E039 Hypothyroidism, unspecified: Secondary | ICD-10-CM | POA: Insufficient documentation

## 2015-07-23 DIAGNOSIS — M25532 Pain in left wrist: Secondary | ICD-10-CM | POA: Insufficient documentation

## 2015-07-23 DIAGNOSIS — I10 Essential (primary) hypertension: Secondary | ICD-10-CM | POA: Insufficient documentation

## 2015-07-23 DIAGNOSIS — Z79899 Other long term (current) drug therapy: Secondary | ICD-10-CM | POA: Insufficient documentation

## 2015-07-23 DIAGNOSIS — Z0000 Encounter for general adult medical examination without abnormal findings: Secondary | ICD-10-CM

## 2015-07-23 DIAGNOSIS — M1990 Unspecified osteoarthritis, unspecified site: Secondary | ICD-10-CM

## 2015-07-23 DIAGNOSIS — M654 Radial styloid tenosynovitis [de Quervain]: Secondary | ICD-10-CM | POA: Insufficient documentation

## 2015-07-23 DIAGNOSIS — K219 Gastro-esophageal reflux disease without esophagitis: Secondary | ICD-10-CM | POA: Insufficient documentation

## 2015-07-23 DIAGNOSIS — E038 Other specified hypothyroidism: Secondary | ICD-10-CM

## 2015-07-23 DIAGNOSIS — Z885 Allergy status to narcotic agent status: Secondary | ICD-10-CM | POA: Insufficient documentation

## 2015-07-23 DIAGNOSIS — M25531 Pain in right wrist: Secondary | ICD-10-CM | POA: Insufficient documentation

## 2015-07-23 HISTORY — DX: Radial styloid tenosynovitis (de quervain): M65.4

## 2015-07-23 LAB — BASIC METABOLIC PANEL
BUN: 13 mg/dL (ref 7–25)
CALCIUM: 9.9 mg/dL (ref 8.6–10.4)
CO2: 29 mmol/L (ref 20–31)
CREATININE: 0.88 mg/dL (ref 0.50–1.05)
Chloride: 104 mmol/L (ref 98–110)
Glucose, Bld: 65 mg/dL (ref 65–99)
Potassium: 3.5 mmol/L (ref 3.5–5.3)
SODIUM: 142 mmol/L (ref 135–146)

## 2015-07-23 LAB — TSH: TSH: 8.142 u[IU]/mL — AB (ref 0.350–4.500)

## 2015-07-23 MED ORDER — TRAMADOL HCL 50 MG PO TABS: 50.0000 mg | ORAL_TABLET | Freq: Three times a day (TID) | ORAL | Status: DC | PRN

## 2015-07-23 NOTE — Progress Notes (Signed)
pateitn states her wrist/hand pain is really no better The tramadol helps some She does not have a wrist brace to wear-RN unable to give her one last visit the clinic was out of them She is out of work and is looking for a job but needs to be able to use her hands She reports anxiety but refuses to fill out screen Patient has not taken her medications today

## 2015-07-23 NOTE — Progress Notes (Signed)
Subjective:    Patient ID: Julie Miranda, female    DOB: 03/25/1961, 54 y.o.   MRN: [id number]  HPI Julie Miranda is a 54 year old female with a history of hypothyroidism, GERD, had seen at the last office visit with bilateral wrist pain and had been unable to open jars or pick up things with her hands. On examination she was holding her upper extremities flexed of the elbow and extended at the wrist bilaterally which was a comfortable position for her. She completed a course of Medrol Dosepak after which she commence meloxicam and has also remained on tramadol with some slight improvement in symptoms. Symptoms are worse on the right and she endorses being able to open jars with her hands.  Medical history is notable for hypertension and hypothyroidism for which she remains on medications. Last TSH was elevated at 11.768 from 11/2014  Past Medical History  Diagnosis Date  . Thyroid disease   . Hypertension   . Gastric ulcer   . GERD (gastroesophageal reflux disease)     Past Surgical History  Procedure Laterality Date  . Gastric bypass      Social History   Social History  . Marital Status: Single    Spouse Name: N/A  . Number of Children: N/A  . Years of Education: N/A   Occupational History  . Not on file.   Social History Main Topics  . Smoking status: Never Smoker   . Smokeless tobacco: Never Used  . Alcohol Use: No  . Drug Use: No  . Sexual Activity: Not Currently    Birth Control/ Protection: Injection   Other Topics Concern  . Not on file   Social History Narrative    Allergies  Allergen Reactions  . Morphine And Related Nausea And Vomiting    Current Outpatient Prescriptions on File Prior to Visit  Medication Sig Dispense Refill  . amLODipine (NORVASC) 10 MG tablet Take 1 tablet (10 mg total) by mouth daily. 90 tablet 3  . APAP (TYLENOL) 325 MG tablet Take 2 tablets (650 mg total) by mouth every 12 (twelve) hours as needed for headache. 30 tablet 1    . docusate sodium 100 MG CAPS Take 100 mg by mouth 2 (two) times daily. 10 capsule 0  . hydrochlorothiazide (HYDRODIURIL) 12.5 MG tablet Take 1 tablet (12.5 mg total) by mouth daily. 90 tablet 3  . levothyroxine (SYNTHROID) 100 MCG tablet Take 1 tablet (100 mcg total) by mouth daily before breakfast. 30 tablet 3  . meclizine (ANTIVERT) 25 MG tablet Take 1 tablet (25 mg total) by mouth 3 (three) times daily as needed. 30 tablet 1  . meloxicam (MOBIC) 7.5 MG tablet Take 1 tablet (7.5 mg total) by mouth daily. 30 tablet 1  . Menthol, Topical Analgesic, (ICY HOT EX) Apply 1 application topically 2 (two) times daily as needed. por pain/spasms    . ondansetron (ZOFRAN) 4 MG tablet Take 1 tablet (4 mg total) by mouth every 8 (eight) hours as needed for nausea. 20 tablet 1  . pantoprazole (PROTONIX) 40 MG tablet Take 1 tablet (40 mg total) by mouth daily. 90 tablet 3  . ranitidine (ZANTAC) 150 MG tablet Take 1 tablet (150 mg total) by mouth at bedtime. 30 tablet 5  . predniSONE (DELTASONE) 20 MG tablet Take 1 tablet (20 mg total) by mouth daily with breakfast. (Patient not taking: Reported on 07/23/2015) 5 tablet 0   No current facility-administered medications on file prior to visit.  Review of Systems Constitutional: Negative for fever, chills, diaphoresis, activity change, appetite change and fatigue. HENT: Negative for ear pain, nosebleeds, congestion, facial swelling, rhinorrhea, neck pain, neck stiffness and ear discharge.  Eyes: Negative for pain, discharge, redness, itching and visual disturbance. Respiratory: Negative for cough, choking, chest tightness, shortness of breath, wheezing and stridor.  Cardiovascular: Negative for chest pain, palpitations and leg swelling. Gastrointestinal: Negative for abdominal distention. Positive for nausea. Genitourinary: Negative for dysuria, urgency, frequency, hematuria, flank pain, decreased urine volume, difficulty urinating and dyspareunia.   Musculoskeletal: See history of present illness Neurological: Negative for dizziness, tremors, seizures, syncope, facial asymmetry, speech difficulty, weakness, light-headedness, numbness and headaches.  Hematological: Negative for adenopathy. Does not bruise/bleed easily. Psychiatric/Behavioral: Negative for hallucinations, behavioral problems, confusion, dysphoric mood, decreased concentration and agitation.      Objective: Filed Vitals:   07/23/15 0944  BP: 135/81  Pulse: 95  Temp: 98.3 F (36.8 C)  Height: 5\' 5"  (1.651 m)  Weight: 196 lb 3.2 oz (88.996 kg)  SpO2: 100%      Physical Exam  Constitutional: Patient appears well-developed and well-nourished. No distress. Neck: Normal ROM. Neck supple. No JVD. No tracheal deviation. No thyromegaly. CVS: RRR, S1/S2 +, no murmurs, no gallops, no carotid bruit.  Pulmonary: Effort and breath sounds normal, no stridor, rhonchi, wheezes, rales.  Abdominal: Soft. BS +,  no distension, tenderness, rebound or guarding.  Musculoskeletal: Unable to make a fist bilaterally tenderness on palpation of the MCP joints and dorsum of her wrist bilaterally with more tenderness elicited at the MCP joint of the thumb, mild edema of the radial aspect of the wrist, negative Phalen and Tinel sign..  Lymphadenopathy: No lymphadenopathy noted, cervical, inguinal or axillary Neuro: Alert. Normal reflexes, muscle tone coordination. No cranial nerve deficit. Skin: Skin is warm and dry. No rash noted. Not diaphoretic. No erythema. No pallor. Psychiatric: Normal mood and affect. Behavior, judgment, thought content normal.  Lab Results  Component Value Date   TSH 11.768* 11/27/2014       Assessment & Plan:  Osteoarthritis vs De Quervain tenosynovitis Completed course of prednisone. Continue meloxicam and tramadol. No wrist brace is available in the clinic and I have advised the patient to obtain this but she complains she cannot afford to do so. Referred  to orthopedics. I have emphasized the need to apply for and [facility] card which will facilitate specialty referral and she promises to do so.   Hypertension: Blood pressure is controlled. Continue amlodipine, Low-sodium diet, DASH diet. Basic metabolic panel today   Hypothyroidism: Uncontrolled TSH sent off today and will adjust medication dose as needed.   This note has been created with Surveyor, quantity. Any transcriptional errors are unintentional.

## 2015-07-24 ENCOUNTER — Other Ambulatory Visit: Payer: Self-pay | Admitting: Family Medicine

## 2015-07-24 MED ORDER — LEVOTHYROXINE SODIUM 112 MCG PO TABS: 112.0000 ug | ORAL_TABLET | Freq: Every day | ORAL | Status: DC

## 2015-07-25 ENCOUNTER — Telehealth: Payer: Self-pay | Admitting: *Deleted

## 2015-07-25 NOTE — Telephone Encounter (Signed)
-----   Message from Arnoldo Morale, MD sent at 07/24/2015  8:39 AM EDT ----- TSH is abnormal and so i have sent a new rx for Levothyroxine 164mcg to the pharmacy.

## 2015-07-25 NOTE — Telephone Encounter (Signed)
Spoke to patient and verified name and date of birth. Told patient TSH is abnormal and so MD  sent a new rx for Levothyroxine 162mcg to the pharmacy.  Patient states she will pick up medication and start to take as prescribed.

## 2015-08-01 ENCOUNTER — Ambulatory Visit: Payer: Self-pay | Attending: Internal Medicine

## 2015-08-08 ENCOUNTER — Ambulatory Visit: Payer: Self-pay | Attending: Internal Medicine

## 2015-08-20 ENCOUNTER — Ambulatory Visit (INDEPENDENT_AMBULATORY_CARE_PROVIDER_SITE_OTHER): Payer: No Typology Code available for payment source | Admitting: Sports Medicine

## 2015-08-20 ENCOUNTER — Encounter: Payer: Self-pay | Admitting: Sports Medicine

## 2015-08-20 ENCOUNTER — Ambulatory Visit
Admission: RE | Admit: 2015-08-20 | Discharge: 2015-08-20 | Disposition: A | Discharge status: Home / Self Care | Payer: No Typology Code available for payment source | Source: Ambulatory Visit | Attending: Sports Medicine | Admitting: Sports Medicine

## 2015-08-20 VITALS — BP 118/101 | Ht 65.0 in | Wt 180.0 lb

## 2015-08-20 DIAGNOSIS — M654 Radial styloid tenosynovitis [de Quervain]: Secondary | ICD-10-CM

## 2015-08-20 DIAGNOSIS — M25531 Pain in right wrist: Secondary | ICD-10-CM

## 2015-08-20 DIAGNOSIS — M25532 Pain in left wrist: Secondary | ICD-10-CM

## 2015-08-20 MED ORDER — PREDNISONE 10 MG PO TABS: ORAL_TABLET | ORAL | Status: DC

## 2015-08-20 NOTE — Progress Notes (Signed)
Subjective:    Patient ID: Julie Miranda, female    DOB: 01/01/1961, 54 y.o.   MRN: [id number]  HPI  54 year old RIGHT hand dominant female with multiple medical problems presenting on referral from internal medicine for bilateral wrist pain that has lasted 3 years but has been getting progressively worse over the last 2 months. She reports that her pain was not proceeded by any trauma. She denies any exercise regimen or activity that was new or her during that time.  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  She states that the pain in her right wrist is predominantly on the radial aspect and radiates from her thumb up into her mid forearm. There is also pain on the extensor surface of the wrist as well.  The left wrist also has pain along the radial aspect but its predominant pain is on the dorsal aspect and ulnar aspect of the wrist extensor surface.  She reports such significant pain from the above that she no longer is able to open a [employer] or other drinks have a screw on top. Her pain has limited her quality of life and is limiting her day-to-day activity significantly.  Of note she has recently been seen by internal medicine as well as hematology for lymphopenia/pancytopenia and had workup for rheumatologic conditions which will be discussed below. Her primary care physician treated her with low-dose meloxicam and a prednisone Dosepak (10 days) with limited relief.   Review of Systems   10-point ROS was negative, other than symptoms discussed above in HPI  PMHx: -- Reviewed  PSHx: -- Reviewed  Meds: -- Reviewed  Social Hx: -- Reviewed   Objective:   Physical Exam  general: -Sitting upright on exam table; no acute distress  Cardiopulmonary: -Nonlabored  respirations  HEENT: -Normocephalic/atraumatic  Extremities: -Neurovascularly intact in upper and lower extremities over gross dermatomes and major pulses   Bilateral wrist exam: -No gross abnormalities on inspection of bony surfaces; there is a mild effusion of the dorsal aspect of the right wrist -4/5 strength in wrist extension and radial deviation of the right wrist. 5/5 strength in all movements on the left and in wrist flexion and ulnar deviation on the right.  -Ulnar deviation on the right caused pain; radial deviation on the left also caused pain  -the patient had significant pain/discomfort with passive ulnar deviation on the right. The patient also had significant discomfort with passive wrist flexion on the right. -Active and passive range of motion limited by pain bilaterally; wrist flexion/extension is limited to 30/20 on the right and limited to 40/30 on the left, respectively. Ulnar and radial deviation is limited to 10 each in the bilateral wrists. -Supination/pronation are nonpainful bilaterally -negative Tinel's on the left; positive on the right. Patient refused to attempt Phalen's bilaterally. -Positive Finkelstein's bilaterally, more so on the right -Negative valgus and varus stress testing of the wrist bilaterally   MS[facility] ultrasound: The RIGHT wrist demonstrated a hypoechoic area within the mid substance of the extensor pollicis brevis tendon in the first extensor compartment on the right. The patient had hypoechoic changes concerning for edemaedema surrounding the entire aspect  of the common  tendon sheath of  the first extensor compartmen on the right. There was also edema surrounding the extensor compartment/within the extensor compartment of the third and fifth compartments of the right  wrist.  The LEFT wrist demonstrated some similar hypoechoic changes surrounding the first extensor compartment but much less than the right and there was no evidence of hypoechoic  changes within the tendon or musculotendinous junction of either the abductor pollicis longus or extensor pollicis brevis     Assessment & Plan:   #1. Undifferentiated bilateral wrist pain  The patient presented with long-standing bilateral wrist pain as per history of present illness above. Her physical exam was concerning for multiple sources of pain generators as well as weakness. Her musculoskeletal ultrasound demonstrated confirmation of Finkelstein's test with evidence of adjacent edema and hypoechoic changes within the musculotendinous junctions of the first extensor compartment. There was also significant edema surrounding the third and fifth extensor compartments of the right wrist. There also was left wrist de Quervain's tenosynovitis on exam and sonography as well. The patient had sought no treatment previously aside from a prednisone Dosepak and low-dose meloxicam which she reported did not improve her symptoms. She has previously had an autoimmune workup which revealed an elevated TSH, for which her Synthroid was increased to 112 g and a negative/undetectable ESR, ANA and rheumatoid factor.  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 Differential diagnosis remains broad at this time.   Plan: - Bilateral 2 views of wrist - Continue mobic - 10-day prednisone taper, RIGHT wrist cock-up splint given numerous compartment involvement on sonography - follow-up in 1 week to reassess response to prednisone and immobilization - further imaging/labs/workup/treatment and/or referral is pending evaluation at that time

## 2015-08-29 ENCOUNTER — Encounter: Payer: Self-pay | Admitting: Internal Medicine

## 2015-08-29 ENCOUNTER — Ambulatory Visit: Payer: No Typology Code available for payment source | Attending: Internal Medicine | Admitting: Internal Medicine

## 2015-08-29 VITALS — BP 113/77 | HR 93 | Temp 98.0°F | Resp 16 | Ht 65.0 in | Wt 193.0 lb

## 2015-08-29 DIAGNOSIS — E079 Disorder of thyroid, unspecified: Secondary | ICD-10-CM

## 2015-08-29 DIAGNOSIS — I10 Essential (primary) hypertension: Secondary | ICD-10-CM

## 2015-08-29 LAB — BASIC METABOLIC PANEL
BUN: 14 mg/dL (ref 7–25)
CALCIUM: 9.1 mg/dL (ref 8.6–10.4)
CO2: 32 mmol/L — AB (ref 20–31)
CREATININE: 0.87 mg/dL (ref 0.50–1.05)
Chloride: 100 mmol/L (ref 98–110)
GLUCOSE: 97 mg/dL (ref 65–99)
Potassium: 3.5 mmol/L (ref 3.5–5.3)
Sodium: 140 mmol/L (ref 135–146)

## 2015-08-29 LAB — TSH: TSH: 0.696 u[IU]/mL (ref 0.350–4.500)

## 2015-08-29 NOTE — Progress Notes (Signed)
Patient here for follow up on her thyroid and HTN Will probably need to have blood work drawn today

## 2015-08-29 NOTE — Progress Notes (Signed)
Patient ID: Julie Miranda, female   DOB: 1961/10/24, 54 y.o.   MRN: [id number] Subjective:  Julie Miranda is a 54 y.o. female with hypertension and thyroid disease here for a follow up visit. Patient had her last TSH drawn 2 months ago which was abnormal. At that time her Synthroid dose was increased. She reports no complaints today.  Throbbing headaches often for past couple of weeks. Some dizziness, nausea, photophobia. Does wear glasses but has misplaced them. Laying down in a dark room helps the headaches. Has tried ibuprofen and acetaminophen with some relief.   Current Outpatient Prescriptions  Medication Sig Dispense Refill  . amLODipine (NORVASC) 10 MG tablet Take 1 tablet (10 mg total) by mouth daily. 90 tablet 3  . APAP (TYLENOL) 325 MG tablet Take 2 tablets (650 mg total) by mouth every 12 (twelve) hours as needed for headache. 30 tablet 1  . hydrochlorothiazide (HYDRODIURIL) 12.5 MG tablet Take 1 tablet (12.5 mg total) by mouth daily. 90 tablet 3  . levothyroxine (SYNTHROID, LEVOTHROID) 112 MCG tablet Take 1 tablet (112 mcg total) by mouth daily before breakfast. 30 tablet 1  . meclizine (ANTIVERT) 25 MG tablet Take 1 tablet (25 mg total) by mouth 3 (three) times daily as needed. 30 tablet 1  . meloxicam (MOBIC) 7.5 MG tablet Take 1 tablet (7.5 mg total) by mouth daily. 30 tablet 1  . ondansetron (ZOFRAN) 4 MG tablet Take 1 tablet (4 mg total) by mouth every 8 (eight) hours as needed for nausea. 20 tablet 1  . pantoprazole (PROTONIX) 40 MG tablet Take 1 tablet (40 mg total) by mouth daily. 90 tablet 3  . predniSONE (DELTASONE) 10 MG tablet Take as directed 48 tablet 0  . ranitidine (ZANTAC) 150 MG tablet Take 1 tablet (150 mg total) by mouth at bedtime. 30 tablet 5  . traMADol (ULTRAM) 50 MG tablet Take 1 tablet (50 mg total) by mouth every 8 (eight) hours as needed. 60 tablet 0  . docusate sodium 100 MG CAPS Take 100 mg by mouth 2 (two) times daily. 10 capsule 0  . Menthol, Topical  Analgesic, (ICY HOT EX) Apply 1 application topically 2 (two) times daily as needed. por pain/spasms    . predniSONE (DELTASONE) 20 MG tablet Take 1 tablet (20 mg total) by mouth daily with breakfast. (Patient not taking: Reported on 07/23/2015) 5 tablet 0  . TRAVEL SICKNESS 25 MG CHEW   1   No current facility-administered medications for this visit.    Hypertension ROS: taking medications as instructed, no medication side effects noted, no TIA's, no chest pain on exertion, no dyspnea on exertion, no swelling of ankles and palpitations described as occuring randomly.  New concerns: hot flashes, all other systems are negative other than what is stated.  Objective:  BP 113/77 mmHg  Pulse 93  Temp(Src) 98 F (36.7 C)  Resp 16  Ht 5\' 5"  (1.651 m)  Wt 193 lb (87.544 kg)  BMI 32.12 kg/m2  SpO2 100%  Appearance alert, well appearing, and in no distress and oriented to person, place, and time. General exam BP noted to be well controlled today in office, S1, S2 normal, no gallop, no murmur, chest clear, no JVD, no HSM, no edema.  Lab review: orders written for new lab studies as appropriate; see orders.   Assessment:   Hypertension well controlled.  Thyroid disease: will recheck TSH level and send refills as appropriate   Plan:  Current treatment plan is effective, no change in  therapy. Reviewed diet, exercise and weight control. Recommended sodium restriction.   Return in about 3 months (around 11/29/2015).    Lance Bosch, NP 09/03/2015 1:56 PM

## 2015-09-03 ENCOUNTER — Ambulatory Visit (INDEPENDENT_AMBULATORY_CARE_PROVIDER_SITE_OTHER): Payer: No Typology Code available for payment source | Admitting: Sports Medicine

## 2015-09-03 ENCOUNTER — Telehealth: Payer: Self-pay

## 2015-09-03 ENCOUNTER — Encounter: Payer: Self-pay | Admitting: Sports Medicine

## 2015-09-03 VITALS — BP 122/90 | Ht 65.0 in | Wt 180.0 lb

## 2015-09-03 DIAGNOSIS — M25531 Pain in right wrist: Secondary | ICD-10-CM

## 2015-09-03 NOTE — Progress Notes (Signed)
Patient ID: Julie Miranda, female   DOB: Jan 04, 1961, 54 y.o.   MRN: [id number]  Patient comes in today to discuss x-rays of both wrists. The x-ray of the right wrist shows diffuse osteopenia . The left wrist shows sclerosis along the distal radius consistent with a healing fracture. The patient does recall a fall back in October which caused her to land on a flexed left wrist. She did get swelling at the time. The prednisone that I put her on during her last office visit did help with the stiffness in her right hand some but did not result in complete symptom resolution. The cockup wrist brace has also been somewhat helpful. She has had symptoms in the right wrist for about 3 years. Although she did fall and injure her left wrist back in October she states that she was having pain in her hand and wrist several months prior to this.   I am at a loss to explain the diffuse osteopenia seen on her x-rays. The distal radius fracture in her left wrist appears to be nearly completely healed. Certainly no angulation or shortening appreciated. To better evaluate the soft tissue structures of her more symptomatic right wrist I would like to go ahead and proceed with an MRI. I will call the patient with those results once available. Of note, I did review some blood work which the patient had done recently and it does not reveal an obvious source for her osteopenia.

## 2015-09-03 NOTE — Telephone Encounter (Signed)
Patient is aware of her lab results 

## 2015-09-03 NOTE — Telephone Encounter (Signed)
-----   Message from Lance Bosch, NP sent at 09/03/2015  2:18 PM EST ----- Labs normal

## 2015-09-15 ENCOUNTER — Ambulatory Visit
Admission: RE | Admit: 2015-09-15 | Discharge: 2015-09-15 | Disposition: A | Discharge status: Home / Self Care | Payer: No Typology Code available for payment source | Source: Ambulatory Visit | Attending: Sports Medicine | Admitting: Sports Medicine

## 2015-09-15 DIAGNOSIS — M25531 Pain in right wrist: Secondary | ICD-10-CM

## 2015-09-22 ENCOUNTER — Telehealth: Payer: Self-pay | Admitting: Sports Medicine

## 2015-09-22 DIAGNOSIS — M654 Radial styloid tenosynovitis [de Quervain]: Secondary | ICD-10-CM

## 2015-09-22 NOTE — Telephone Encounter (Signed)
I spoke with the patient on the phone today after reviewing the MRI of her right wrist. She has severe DeQuervain's tenosynovitis but in addition to this she also has inflammation and subluxation of the ulnar nerve along with inflammation of a digital branch of the median nerve. There is also evidence of tenosynovitis in the ECU tendon and extensor digitorum tendons. This is indeed a rather bizarre constellation of findings and she has identical physical exam findings of her left wrist. I think she needs to be evaluated by a hand and wrist orthopedic specialist. Unfortunately she is currently without insurance so I will try to get her in with the indigent program at [facility] in [place].

## 2015-09-24 NOTE — Addendum Note (Signed)
Addended by: Cyd Silence on: 09/24/2015 11:43 AM   Modules accepted: Orders

## 2015-10-17 ENCOUNTER — Telehealth: Payer: Self-pay | Admitting: Internal Medicine

## 2015-10-17 MED ORDER — LEVOTHYROXINE SODIUM 112 MCG PO TABS: 112.0000 ug | ORAL_TABLET | Freq: Every day | ORAL | Status: DC

## 2015-10-17 NOTE — Telephone Encounter (Signed)
Medication refill levothyroxine (SYNTHROID, LEVOTHROID) 112 MCG tablet

## 2015-10-31 ENCOUNTER — Ambulatory Visit: Payer: No Typology Code available for payment source | Attending: Internal Medicine | Admitting: Internal Medicine

## 2015-10-31 ENCOUNTER — Encounter: Payer: Self-pay | Admitting: Internal Medicine

## 2015-10-31 VITALS — BP 128/88 | HR 78 | Temp 98.0°F | Resp 16 | Ht 65.0 in | Wt 198.6 lb

## 2015-10-31 DIAGNOSIS — M545 Low back pain, unspecified: Secondary | ICD-10-CM

## 2015-10-31 DIAGNOSIS — M25512 Pain in left shoulder: Secondary | ICD-10-CM

## 2015-10-31 DIAGNOSIS — I10 Essential (primary) hypertension: Secondary | ICD-10-CM

## 2015-10-31 MED ORDER — LISINOPRIL 10 MG PO TABS: 10.0000 mg | ORAL_TABLET | Freq: Every day | ORAL | Status: DC

## 2015-10-31 MED ORDER — TRAMADOL HCL 50 MG PO TABS: 50.0000 mg | ORAL_TABLET | Freq: Three times a day (TID) | ORAL | Status: DC | PRN

## 2015-10-31 MED ORDER — MELOXICAM 15 MG PO TABS: 15.0000 mg | ORAL_TABLET | Freq: Every day | ORAL | Status: DC

## 2015-10-31 MED FILL — ?LEVOTHYROXINE 112 MCG TAB: 112 | 30 days supply | Qty: 30 | Fill #0

## 2015-10-31 MED FILL — MELOXICAM 15 MG TABLET: 15 | 30 days supply | Qty: 30 | Fill #0

## 2015-10-31 MED FILL — traMADol HCL 50 MG TABS: 50 | 20 days supply | Qty: 60 | Fill #0

## 2015-10-31 MED FILL — ?LISINOPRIL 10 MG TABLET: 10 | 30 days supply | Qty: 30 | Fill #0

## 2015-10-31 NOTE — Progress Notes (Signed)
Patient ID: Julie Miranda, female   DOB: Oct 30, 1960, 55 y.o.   MRN: [id number]  CC: pain, swelling  HPI: Julie Miranda is a 55 y.o. female here today for a follow up visit.  Patient has past medical history of thyroid disease, HTN, GERD. Patient reports that she has been having swelling in her ankles and she believes it is coming from her Amlodipine. She has been on her Amlodipine for several months but states that it is now causing swelling in her hands and she is unable to wear her jewelry.  Pain in left shoulder that has been present for the past 2 years. She states that over the last year it is becoming worse. Pain described as a throbbing pain that radiates to her neck. Pain is aggravated by moving her arm across her body and above her head. She has heating pads, ice, muscle rubs, and aspirin. Has been on Tramadol in the past. She is seeing a orthopedics for her wrist but not the shoulder and back pain.  Lower back pain for several years. States that she fell in September after being diagnosed with autoimmune disorder. She states that she now has a herniated disc and believes it is causing her right hip pain.   Allergies  Allergen Reactions  . Morphine And Related Nausea And Vomiting   Past Medical History  Diagnosis Date  . Thyroid disease   . Hypertension   . Gastric ulcer   . GERD (gastroesophageal reflux disease)    Current Outpatient Prescriptions on File Prior to Visit  Medication Sig Dispense Refill  . amLODipine (NORVASC) 10 MG tablet Take 1 tablet (10 mg total) by mouth daily. 90 tablet 3  . APAP (TYLENOL) 325 MG tablet Take 2 tablets (650 mg total) by mouth every 12 (twelve) hours as needed for headache. 30 tablet 1  . docusate sodium 100 MG CAPS Take 100 mg by mouth 2 (two) times daily. 10 capsule 0  . hydrochlorothiazide (HYDRODIURIL) 12.5 MG tablet Take 1 tablet (12.5 mg total) by mouth daily. 90 tablet 3  . levothyroxine (SYNTHROID, LEVOTHROID) 112 MCG tablet Take 1  tablet (112 mcg total) by mouth daily before breakfast. 30 tablet 1  . meclizine (ANTIVERT) 25 MG tablet Take 1 tablet (25 mg total) by mouth 3 (three) times daily as needed. 30 tablet 1  . meloxicam (MOBIC) 7.5 MG tablet Take 1 tablet (7.5 mg total) by mouth daily. 30 tablet 1  . Menthol, Topical Analgesic, (ICY HOT EX) Apply 1 application topically 2 (two) times daily as needed. por pain/spasms    . ondansetron (ZOFRAN) 4 MG tablet Take 1 tablet (4 mg total) by mouth every 8 (eight) hours as needed for nausea. 20 tablet 1  . pantoprazole (PROTONIX) 40 MG tablet Take 1 tablet (40 mg total) by mouth daily. 90 tablet 3  . ranitidine (ZANTAC) 150 MG tablet Take 1 tablet (150 mg total) by mouth at bedtime. 30 tablet 5  . traMADol (ULTRAM) 50 MG tablet Take 1 tablet (50 mg total) by mouth every 8 (eight) hours as needed. 60 tablet 0  . TRAVEL SICKNESS 25 MG CHEW   1  . predniSONE (DELTASONE) 10 MG tablet Take as directed 48 tablet 0  . predniSONE (DELTASONE) 20 MG tablet Take 1 tablet (20 mg total) by mouth daily with breakfast. (Patient not taking: Reported on 07/23/2015) 5 tablet 0   No current facility-administered medications on file prior to visit.   Family History  Problem Relation Age of  Onset  . Colon cancer Neg Hx   . Diabetes Mother   . Diabetes Maternal Aunt     x 2  . Heart disease Sister   . Liver cancer Maternal Aunt   . Kidney disease Neg Hx    Social History   Social History  . Marital Status: Single    Spouse Name: N/A  . Number of Children: N/A  . Years of Education: N/A   Occupational History  . Not on file.   Social History Main Topics  . Smoking status: Never Smoker   . Smokeless tobacco: Never Used  . Alcohol Use: No  . Drug Use: No  . Sexual Activity: Not Currently    Birth Control/ Protection: Injection   Other Topics Concern  . Not on file   Social History Narrative    Review of Systems: Other than what is stated in HPI, all other systems are  negative.   Objective:   Filed Vitals:   10/31/15 0908  BP: 128/88  Pulse: 78  Temp: 98 F (36.7 C)  Resp: 16    Physical Exam  Constitutional: She is oriented to person, place, and time.  Cardiovascular: Normal rate, regular rhythm and normal heart sounds.   Pulmonary/Chest: Effort normal and breath sounds normal.  Musculoskeletal: She exhibits tenderness (lumbar spine). She exhibits no edema.  Neurological: She is alert and oriented to person, place, and time.  Skin: Skin is warm and dry.  Psychiatric: She has a normal mood and affect.     Lab Results  Component Value Date   WBC 2.7* 11/29/2013   HGB 10.8* 11/29/2013   HCT 33.3* 11/29/2013   MCV 86.0 11/29/2013   PLT 292 11/29/2013   Lab Results  Component Value Date   CREATININE 0.87 08/29/2015   BUN 14 08/29/2015   NA 140 08/29/2015   K 3.5 08/29/2015   CL 100 08/29/2015   CO2 32* 08/29/2015    Lab Results  Component Value Date   HGBA1C 5.2 11/27/2014   Lipid Panel     Component Value Date/Time   CHOL 261* 11/27/2014 1001   TRIG 138 11/27/2014 1001   HDL 87 11/27/2014 1001   CHOLHDL 3.0 11/27/2014 1001   VLDL 28 11/27/2014 1001   LDLCALC 146* 11/27/2014 1001       Assessment and plan:   Julie Miranda was seen today for shoulder pain.  Diagnoses and all orders for this visit:  Essential hypertension -     lisinopril (PRINIVIL,ZESTRIL) 10 MG tablet; Take 1 tablet (10 mg total) by mouth daily. I have discontinued the Amlodipine and switched her to Lisinopril 10 mg.   Left shoulder pain -     meloxicam (MOBIC) 15 MG tablet; Take 1 tablet (15 mg total) by mouth daily. -     traMADol (ULTRAM) 50 MG tablet; Take 1 tablet (50 mg total) by mouth every 8 (eight) hours as needed. -     Ambulatory referral to Orthopedic Surgery  Bilateral low back pain without sciatica See above      Return in about 2 weeks (around 11/14/2015) for Nurse Visit-BP check and 3 mo PCP HTN.   Lance Bosch, [place] [facility] [phone] 10/31/2015, 9:31 AM

## 2015-10-31 NOTE — Progress Notes (Signed)
Patient complains of bilateral ankle swelling she states is from Her HTN medication Also complains of left shoulder pain and lower back pain Requesting a referral for a mammogram as well

## 2015-10-31 NOTE — Patient Instructions (Signed)
Stop Amlodipine. I have switched you over to Lisinopril 10 mg once per day. If you develop a dry cough call me.  I have placed referral to Orthopedics for your shoulder pain

## 2015-11-05 ENCOUNTER — Other Ambulatory Visit: Payer: Self-pay

## 2015-11-05 DIAGNOSIS — Z1231 Encounter for screening mammogram for malignant neoplasm of breast: Secondary | ICD-10-CM

## 2015-11-06 ENCOUNTER — Ambulatory Visit: Payer: No Typology Code available for payment source | Attending: Internal Medicine | Admitting: Internal Medicine

## 2015-11-06 ENCOUNTER — Other Ambulatory Visit (HOSPITAL_COMMUNITY)
Admission: RE | Admit: 2015-11-06 | Discharge: 2015-11-06 | Disposition: A | Discharge status: Home / Self Care | Payer: No Typology Code available for payment source | Source: Ambulatory Visit | Attending: Internal Medicine | Admitting: Internal Medicine

## 2015-11-06 ENCOUNTER — Encounter: Payer: Self-pay | Admitting: Internal Medicine

## 2015-11-06 VITALS — BP 123/87 | HR 77 | Temp 98.0°F | Resp 16 | Ht 65.0 in | Wt 200.0 lb

## 2015-11-06 DIAGNOSIS — Z8719 Personal history of other diseases of the digestive system: Secondary | ICD-10-CM | POA: Insufficient documentation

## 2015-11-06 DIAGNOSIS — Z124 Encounter for screening for malignant neoplasm of cervix: Secondary | ICD-10-CM

## 2015-11-06 DIAGNOSIS — I10 Essential (primary) hypertension: Secondary | ICD-10-CM | POA: Insufficient documentation

## 2015-11-06 DIAGNOSIS — K5900 Constipation, unspecified: Secondary | ICD-10-CM

## 2015-11-06 DIAGNOSIS — Z808 Family history of malignant neoplasm of other organs or systems: Secondary | ICD-10-CM | POA: Insufficient documentation

## 2015-11-06 DIAGNOSIS — Z01419 Encounter for gynecological examination (general) (routine) without abnormal findings: Secondary | ICD-10-CM | POA: Insufficient documentation

## 2015-11-06 DIAGNOSIS — E079 Disorder of thyroid, unspecified: Secondary | ICD-10-CM | POA: Insufficient documentation

## 2015-11-06 DIAGNOSIS — Z1272 Encounter for screening for malignant neoplasm of vagina: Secondary | ICD-10-CM | POA: Insufficient documentation

## 2015-11-06 DIAGNOSIS — K5909 Other constipation: Secondary | ICD-10-CM

## 2015-11-06 DIAGNOSIS — K219 Gastro-esophageal reflux disease without esophagitis: Secondary | ICD-10-CM | POA: Insufficient documentation

## 2015-11-06 DIAGNOSIS — Z79899 Other long term (current) drug therapy: Secondary | ICD-10-CM | POA: Insufficient documentation

## 2015-11-06 MED ORDER — LUBIPROSTONE 8 MCG PO CAPS: 8.0000 ug | ORAL_CAPSULE | Freq: Every day | ORAL | Status: DC

## 2015-11-06 MED FILL — ?AMITIZA 8 MCG CAPSULE: 8 MCG | 30 days supply | Qty: 30 | Fill #0

## 2015-11-06 NOTE — Patient Instructions (Addendum)
I have started you on Amitiza for constipation. I am starting you on a very low once per day dose. Try this for the next 2 weeks and let me know if you have improvement. IF not I can make changes to dose   Call breast center and tell them you need to apply for scholarship for mammogram since you do not have insurance.

## 2015-11-06 NOTE — Progress Notes (Signed)
Patient states she is here for pap and breast exam only

## 2015-11-06 NOTE — Progress Notes (Signed)
Patient ID: Julie Miranda, female   DOB: 05/03/1961, 55 y.o.   MRN: [id number]  CC: pap  HPI: Julie Miranda is a 55 y.o. female here today for a pap smear.  Patient has past medical history of thyroid disease, HTN, GERD, constipation. Patient reports that she is due for her pelvic exam is unsure of the last date and results of her last pap smear. She refuses STD testing today. Patient is concerned about chronic constipation that she has suffered from since being young. She reports that she takes one scoop of Miralax daily with a stool softener at bedtime. She also uses fiber in her coffee daily. Her last colonoscopy was 2 years ago.   Allergies  Allergen Reactions  . Morphine And Related Nausea And Vomiting   Past Medical History  Diagnosis Date  . Thyroid disease   . Hypertension   . Gastric ulcer   . GERD (gastroesophageal reflux disease)    Current Outpatient Prescriptions on File Prior to Visit  Medication Sig Dispense Refill  . hydrochlorothiazide (HYDRODIURIL) 12.5 MG tablet Take 1 tablet (12.5 mg total) by mouth daily. 90 tablet 3  . levothyroxine (SYNTHROID, LEVOTHROID) 112 MCG tablet Take 1 tablet (112 mcg total) by mouth daily before breakfast. 30 tablet 1  . lisinopril (PRINIVIL,ZESTRIL) 10 MG tablet Take 1 tablet (10 mg total) by mouth daily. 30 tablet 3  . meclizine (ANTIVERT) 25 MG tablet Take 1 tablet (25 mg total) by mouth 3 (three) times daily as needed. 30 tablet 1  . meloxicam (MOBIC) 15 MG tablet Take 1 tablet (15 mg total) by mouth daily. 30 tablet 1  . pantoprazole (PROTONIX) 40 MG tablet Take 1 tablet (40 mg total) by mouth daily. 90 tablet 3  . ranitidine (ZANTAC) 150 MG tablet Take 1 tablet (150 mg total) by mouth at bedtime. 30 tablet 5  . traMADol (ULTRAM) 50 MG tablet Take 1 tablet (50 mg total) by mouth every 8 (eight) hours as needed. 60 tablet 0  . TRAVEL SICKNESS 25 MG CHEW   1  . APAP (TYLENOL) 325 MG tablet Take 2 tablets (650 mg total) by mouth every  12 (twelve) hours as needed for headache. 30 tablet 1  . docusate sodium 100 MG CAPS Take 100 mg by mouth 2 (two) times daily. 10 capsule 0  . Menthol, Topical Analgesic, (ICY HOT EX) Apply 1 application topically 2 (two) times daily as needed. por pain/spasms    . ondansetron (ZOFRAN) 4 MG tablet Take 1 tablet (4 mg total) by mouth every 8 (eight) hours as needed for nausea. 20 tablet 1   No current facility-administered medications on file prior to visit.   Family History  Problem Relation Age of Onset  . Colon cancer Neg Hx   . Diabetes Mother   . Diabetes Maternal Aunt     x 2  . Heart disease Sister   . Liver cancer Maternal Aunt   . Kidney disease Neg Hx    Social History   Social History  . Marital Status: Single    Spouse Name: N/A  . Number of Children: N/A  . Years of Education: N/A   Occupational History  . Not on file.   Social History Main Topics  . Smoking status: Never Smoker   . Smokeless tobacco: Never Used  . Alcohol Use: No  . Drug Use: No  . Sexual Activity: Not Currently    Birth Control/ Protection: Injection   Other Topics Concern  . Not  on file   Social History Narrative    Review of Systems: Other than what is stated in HPI, all other systems are negative.   Objective:   Filed Vitals:   11/06/15 1446  BP: 123/87  Pulse: 77  Temp: 98 F (36.7 C)  Resp: 16    Physical Exam  Constitutional: She is oriented to person, place, and time.  Pulmonary/Chest: Right breast exhibits no mass.  Abdominal: Soft. Bowel sounds are normal. There is no tenderness.  Genitourinary: Vagina normal and uterus normal. No breast tenderness or discharge. Cervix exhibits no motion tenderness, no discharge and no friability. Right adnexum displays no tenderness. Left adnexum displays no tenderness.  Lymphadenopathy:       Right: No inguinal adenopathy present.       Left: No inguinal adenopathy present.  Neurological: She is alert and oriented to person,  place, and time.     Lab Results  Component Value Date   WBC 2.7* 11/29/2013   HGB 10.8* 11/29/2013   HCT 33.3* 11/29/2013   MCV 86.0 11/29/2013   PLT 292 11/29/2013   Lab Results  Component Value Date   CREATININE 0.87 08/29/2015   BUN 14 08/29/2015   NA 140 08/29/2015   K 3.5 08/29/2015   CL 100 08/29/2015   CO2 32* 08/29/2015    Lab Results  Component Value Date   HGBA1C 5.2 11/27/2014   Lipid Panel     Component Value Date/Time   CHOL 261* 11/27/2014 1001   TRIG 138 11/27/2014 1001   HDL 87 11/27/2014 1001   CHOLHDL 3.0 11/27/2014 1001   VLDL 28 11/27/2014 1001   LDLCALC 146* 11/27/2014 1001       Assessment and plan:   Julie Miranda was seen today for gynecologic exam.  Diagnoses and all orders for this visit:  Papanicolaou smear -     Cytology - PAP [place] Patient advised to call and schedule mammogram soon.   Chronic constipation -     Begin lubiprostone (AMITIZA) 8 MCG capsule; Take 1 capsule (8 mcg total) by mouth daily with breakfast. I am starting you on a very low once per day dose. Try this for the next 2 weeks and let me know if you have improvement. If  not I can make changes to dose   Return if symptoms worsen or fail to improve.   Lance Bosch, [place] [facility] [phone] 11/06/2015, 3:30 PM

## 2015-11-07 ENCOUNTER — Other Ambulatory Visit: Payer: Self-pay

## 2015-11-07 DIAGNOSIS — K5909 Other constipation: Secondary | ICD-10-CM

## 2015-11-07 MED ORDER — LUBIPROSTONE 8 MCG PO CAPS: 8.0000 ug | ORAL_CAPSULE | Freq: Every day | ORAL | Status: DC

## 2015-11-11 ENCOUNTER — Ambulatory Visit
Admission: RE | Admit: 2015-11-11 | Discharge: 2015-11-11 | Disposition: A | Discharge status: Home / Self Care | Payer: No Typology Code available for payment source | Source: Ambulatory Visit

## 2015-11-11 ENCOUNTER — Encounter: Payer: No Typology Code available for payment source | Admitting: Pharmacist

## 2015-11-11 DIAGNOSIS — Z1231 Encounter for screening mammogram for malignant neoplasm of breast: Secondary | ICD-10-CM

## 2015-11-12 LAB — CYTOLOGY - PAP

## 2015-11-13 ENCOUNTER — Telehealth: Payer: Self-pay

## 2015-11-13 NOTE — Telephone Encounter (Signed)
-----   Message from Lance Bosch, NP sent at 11/13/2015  9:02 AM EST ----- Normal pap will repeat in 3 years

## 2015-11-13 NOTE — Telephone Encounter (Signed)
Spoke with patient and she is aware of her normal mammogram results

## 2015-11-13 NOTE — Telephone Encounter (Signed)
-----   Message from Lance Bosch, NP sent at 11/13/2015  9:03 AM EST ----- Normal mammogram

## 2015-11-13 NOTE — Telephone Encounter (Signed)
Spoke with patient this am and she is aware of her normal pap results

## 2015-11-14 ENCOUNTER — Other Ambulatory Visit: Payer: Self-pay | Admitting: Internal Medicine

## 2015-11-14 ENCOUNTER — Telehealth: Payer: Self-pay

## 2015-11-14 DIAGNOSIS — I10 Essential (primary) hypertension: Secondary | ICD-10-CM

## 2015-11-14 DIAGNOSIS — K5909 Other constipation: Secondary | ICD-10-CM

## 2015-11-14 MED ORDER — LOSARTAN POTASSIUM 25 MG PO TABS: 25.0000 mg | ORAL_TABLET | Freq: Every day | ORAL | Status: DC

## 2015-11-14 MED ORDER — LUBIPROSTONE 24 MCG PO CAPS: 24.0000 ug | ORAL_CAPSULE | Freq: Two times a day (BID) | ORAL | Status: DC

## 2015-11-14 MED ORDER — LUBIPROSTONE 8 MCG PO CAPS: 8.0000 ug | ORAL_CAPSULE | Freq: Two times a day (BID) | ORAL | Status: DC

## 2015-11-14 MED FILL — ?AMITIZA 24 MCG CAPSULES: 24MCG | 16 days supply | Qty: 32 | Fill #0

## 2015-11-14 MED FILL — LOSARTAN POTASSIUM 25 MG TA: 25 | 30 days supply | Qty: 30 | Fill #0

## 2015-11-14 NOTE — Telephone Encounter (Signed)
Tried to call patient  Patient not available Unable to leave voice mail  Memory is full

## 2015-11-14 NOTE — Telephone Encounter (Signed)
Returned patient phone call Patient not available Unable to leave voice mail-memory is full

## 2015-11-17 ENCOUNTER — Ambulatory Visit: Payer: No Typology Code available for payment source | Admitting: Sports Medicine

## 2015-11-19 ENCOUNTER — Ambulatory Visit: Payer: No Typology Code available for payment source

## 2015-11-26 ENCOUNTER — Other Ambulatory Visit: Payer: Self-pay | Admitting: Internal Medicine

## 2015-11-26 DIAGNOSIS — K5909 Other constipation: Secondary | ICD-10-CM

## 2015-11-26 MED ORDER — LUBIPROSTONE 8 MCG PO CAPS: 8.0000 ug | ORAL_CAPSULE | Freq: Two times a day (BID) | ORAL | Status: DC

## 2015-12-01 ENCOUNTER — Other Ambulatory Visit: Payer: Self-pay | Admitting: Internal Medicine

## 2015-12-01 MED ORDER — LUBIPROSTONE 24 MCG PO CAPS: 24.0000 ug | ORAL_CAPSULE | Freq: Two times a day (BID) | ORAL | Status: DC

## 2015-12-05 MED FILL — raNITIdine HCL 150 MG TABS: 150 | 30 days supply | Qty: 30 | Fill #3

## 2015-12-05 MED FILL — ?LISINOPRIL 10 MG TABLET: 10 | 30 days supply | Qty: 30 | Fill #1

## 2015-12-05 MED FILL — HYDROCHLOROTHIAZIDE 12.5 MG: 12.5 | 30 days supply | Qty: 30 | Fill #3

## 2015-12-05 MED FILL — MELOXICAM 15 MG TABLET: 15 | 30 days supply | Qty: 30 | Fill #1

## 2015-12-05 MED FILL — ?LEVOTHYROXINE 112 MCG TAB: 112 | 30 days supply | Qty: 30 | Fill #1

## 2015-12-05 MED FILL — ?PANTOPRAZOLE SOD DR 40MG: 40 MG | 30 days supply | Qty: 30 | Fill #3

## 2015-12-08 MED FILL — LOSARTAN POTASSIUM 25 MG TA: 25 | 30 days supply | Qty: 30 | Fill #1

## 2015-12-23 ENCOUNTER — Other Ambulatory Visit: Payer: Self-pay

## 2015-12-23 MED ORDER — LUBIPROSTONE 8 MCG PO CAPS: 8.0000 ug | ORAL_CAPSULE | Freq: Three times a day (TID) | ORAL | Status: DC

## 2015-12-23 MED FILL — ?AMITIZA 8 MCG CAPSULE: 8 MCG | 37 days supply | Qty: 112 | Fill #0

## 2015-12-24 ENCOUNTER — Encounter: Payer: Self-pay | Admitting: Internal Medicine

## 2015-12-24 ENCOUNTER — Other Ambulatory Visit: Payer: Self-pay | Admitting: Internal Medicine

## 2015-12-24 ENCOUNTER — Ambulatory Visit: Payer: No Typology Code available for payment source | Attending: Internal Medicine | Admitting: Internal Medicine

## 2015-12-24 VITALS — BP 113/77 | HR 75 | Temp 97.6°F | Resp 18 | Ht 64.0 in | Wt 191.4 lb

## 2015-12-24 DIAGNOSIS — F419 Anxiety disorder, unspecified: Secondary | ICD-10-CM

## 2015-12-24 DIAGNOSIS — K2970 Gastritis, unspecified, without bleeding: Secondary | ICD-10-CM

## 2015-12-24 MED ORDER — ONDANSETRON HCL 4 MG PO TABS: 4.0000 mg | ORAL_TABLET | Freq: Three times a day (TID) | ORAL | Status: DC | PRN

## 2015-12-24 MED ORDER — SUCRALFATE 1 GM/10ML PO SUSP: 1.0000 g | Freq: Three times a day (TID) | ORAL | Status: DC

## 2015-12-24 MED ORDER — CLONAZEPAM 0.5 MG PO TABS: 0.5000 mg | ORAL_TABLET | Freq: Two times a day (BID) | ORAL | Status: DC | PRN

## 2015-12-24 MED ORDER — MECLIZINE HCL 25 MG PO TABS: 25.0000 mg | ORAL_TABLET | Freq: Three times a day (TID) | ORAL | Status: DC | PRN

## 2015-12-24 MED FILL — clonazePAM 0.5 MG TABS: 0.5 | 15 days supply | Qty: 30 | Fill #0

## 2015-12-24 MED FILL — ONDANSETRON HCL 4 MG TABLET: 4 | 10 days supply | Qty: 20 | Fill #0

## 2015-12-24 MED FILL — CARAFATE 1 GM/10 ML SUSP: 1 | 11 days supply | Qty: 420 | Fill #0

## 2015-12-24 MED FILL — TRAVEL SICKNESS 25 MG TAB C: 25 | 10 days supply | Qty: 30 | Fill #0

## 2015-12-24 NOTE — Patient Instructions (Signed)
Melatonin 5 mg one tablet at bedtime. May get this from [facility]

## 2015-12-24 NOTE — Progress Notes (Signed)
Patient ID: Julie Miranda, female   DOB: 1961-05-21, 55 y.o.   MRN: [id number]  CC: abdominal pain  HPI: Julie Miranda is a 55 y.o. female here today for a follow up visit.  Patient has past medical history of thyroid disease, gastric ulcers, HTN, and GERD. Patient reports severe generalized abdominal cramps that started last week. Patient reports that she has a long history of abdominal pain but this was more severe and constant unlike usual. Pain is described as, "something eating the inside of my stomach". During episode she tried antacids without relief. She notes that she had some diarrhea during this time as well with one day of emesis times 2. She feels like she may have had a fever before the abdominal pains began.  Patient reports that she has been very stressed recently due to caring for her sick mother, sister, and dog. She states that when she gets upset she begins to have tingling in her face and a slight chest pain that only last briefly. She is tearful and rocking back and forth during history.   Allergies  Allergen Reactions  . Morphine And Related Nausea And Vomiting   Past Medical History  Diagnosis Date  . Thyroid disease   . Hypertension   . Gastric ulcer   . GERD (gastroesophageal reflux disease)    Current Outpatient Prescriptions on File Prior to Visit  Medication Sig Dispense Refill  . APAP (TYLENOL) 325 MG tablet Take 2 tablets (650 mg total) by mouth every 12 (twelve) hours as needed for headache. 30 tablet 1  . docusate sodium 100 MG CAPS Take 100 mg by mouth 2 (two) times daily. 10 capsule 0  . hydrochlorothiazide (HYDRODIURIL) 12.5 MG tablet Take 1 tablet (12.5 mg total) by mouth daily. 90 tablet 3  . levothyroxine (SYNTHROID, LEVOTHROID) 112 MCG tablet Take 1 tablet (112 mcg total) by mouth daily before breakfast. 30 tablet 1  . losartan (COZAAR) 25 MG tablet Take 1 tablet (25 mg total) by mouth daily. 30 tablet 1  . lubiprostone (AMITIZA) 24 MCG capsule Take 1  capsule (24 mcg total) by mouth 2 (two) times daily with a meal. 180 capsule 3  . lubiprostone (AMITIZA) 8 MCG capsule Take 1 capsule (8 mcg total) by mouth 2 (two) times daily with a meal. 90 capsule 3  . lubiprostone (AMITIZA) 8 MCG capsule Take 1 capsule (8 mcg total) by mouth 3 (three) times daily with meals. 112 capsule 0  . lubiprostone (AMITIZA) 8 MCG capsule Take 1 capsule (8 mcg total) by mouth 3 (three) times daily with meals. 112 capsule 0  . meclizine (ANTIVERT) 25 MG tablet Take 1 tablet (25 mg total) by mouth 3 (three) times daily as needed. 30 tablet 1  . meloxicam (MOBIC) 15 MG tablet Take 1 tablet (15 mg total) by mouth daily. 30 tablet 1  . Menthol, Topical Analgesic, (ICY HOT EX) Apply 1 application topically 2 (two) times daily as needed. por pain/spasms    . ondansetron (ZOFRAN) 4 MG tablet Take 1 tablet (4 mg total) by mouth every 8 (eight) hours as needed for nausea. 20 tablet 1  . pantoprazole (PROTONIX) 40 MG tablet Take 1 tablet (40 mg total) by mouth daily. 90 tablet 3  . ranitidine (ZANTAC) 150 MG tablet Take 1 tablet (150 mg total) by mouth at bedtime. 30 tablet 5  . traMADol (ULTRAM) 50 MG tablet Take 1 tablet (50 mg total) by mouth every 8 (eight) hours as needed. 60 tablet 0  .  TRAVEL SICKNESS 25 MG CHEW   1   No current facility-administered medications on file prior to visit.   Family History  Problem Relation Age of Onset  . Colon cancer Neg Hx   . Diabetes Mother   . Diabetes Maternal Aunt     x 2  . Heart disease Sister   . Liver cancer Maternal Aunt   . Kidney disease Neg Hx    Social History   Social History  . Marital Status: Single    Spouse Name: N/A  . Number of Children: N/A  . Years of Education: N/A   Occupational History  . Not on file.   Social History Main Topics  . Smoking status: Never Smoker   . Smokeless tobacco: Never Used  . Alcohol Use: No  . Drug Use: No  . Sexual Activity: Not Currently    Birth Control/ Protection:  Injection   Other Topics Concern  . Not on file   Social History Narrative    Review of Systems  Cardiovascular: Positive for chest pain (when anxious/upset).  Gastrointestinal: Positive for vomiting, abdominal pain and diarrhea (resolved). Negative for heartburn, nausea, constipation, blood in stool and melena.  Genitourinary: Negative.   Neurological: Negative for dizziness.  Psychiatric/Behavioral: The patient is nervous/anxious and has insomnia.   All other systems reviewed and are negative.    Objective:   Filed Vitals:   12/24/15 0946  BP: 113/77  Pulse: 75  Temp: 97.6 F (36.4 C)  Resp: 18    Physical Exam  Constitutional: She is oriented to person, place, and time.  Cardiovascular: Normal rate, regular rhythm and normal heart sounds.   Pulmonary/Chest: Effort normal and breath sounds normal. She exhibits no tenderness.  Abdominal: Soft. Bowel sounds are normal. There is tenderness (generalized).  Neurological: She is alert and oriented to person, place, and time.  Skin: She is not diaphoretic.  Psychiatric: She has a normal mood and affect.     Lab Results  Component Value Date   WBC 2.7* 11/29/2013   HGB 10.8* 11/29/2013   HCT 33.3* 11/29/2013   MCV 86.0 11/29/2013   PLT 292 11/29/2013   Lab Results  Component Value Date   CREATININE 0.87 08/29/2015   BUN 14 08/29/2015   NA 140 08/29/2015   K 3.5 08/29/2015   CL 100 08/29/2015   CO2 32* 08/29/2015    Lab Results  Component Value Date   HGBA1C 5.2 11/27/2014   Lipid Panel     Component Value Date/Time   CHOL 261* 11/27/2014 1001   TRIG 138 11/27/2014 1001   HDL 87 11/27/2014 1001   CHOLHDL 3.0 11/27/2014 1001   VLDL 28 11/27/2014 1001   LDLCALC 146* 11/27/2014 1001       Assessment and plan:   [name] was seen today for abdominal cramping.  Diagnoses and all orders for this visit:  Gastritis -     sucralfate (CARAFATE) 1 GM/10ML suspension; Take 10 mLs (1 g total) by mouth 4  (four) times daily -  with meals and at bedtime. For ulcers Patient reports that she takes Advil PM every night to help with sleep. It is very likely due to stress and chronic NSAID use she has a ulcer. I stressed continued use of Protonix and I have added on Carafate. Stop NSAID's.   Anxiety -    Begin clonazePAM (KLONOPIN) 0.5 MG tablet; Take 1 tablet (0.5 mg total) by mouth 2 (two) times daily as needed for anxiety. Explained  that she may use Klonopin only as needed for anxiety. We discussed way to calm down and handle stress.   Insomnia Try Melatonin 1-2 hours before bed to help with sleep. If no improvement she may call back after 4 weeks.  Return if symptoms worsen or fail to improve.       Lance Bosch, [place] [facility] [phone] 12/24/2015, 9:43 AM

## 2015-12-24 NOTE — Progress Notes (Signed)
Patient here for abdominal cramps she has been experiencing since last week. Pain rated as a 6 described as "something eating away at my stomach." Patient states the pain has been off and on for years, but constant this time and accompanied by diarrhea and vomitting. Patient reports it started with fevers and chills and lead to abdominal cramps. Patient needs a refill on zofran and antivert.

## 2016-01-12 MED FILL — AMITIZA 24 MCG CAPSULES: 24 | 16 days supply | Qty: 32 | Fill #1

## 2016-02-05 ENCOUNTER — Other Ambulatory Visit: Payer: Self-pay | Admitting: Internal Medicine

## 2016-02-05 MED FILL — ?PANTOPRAZOLE SOD DR 40MG: 40 MG | 30 days supply | Qty: 30 | Fill #4

## 2016-02-05 MED FILL — clonazePAM 0.5 MG TABS: 0.5 | 15 days supply | Qty: 30 | Fill #1

## 2016-02-05 MED FILL — ?HYDROCHLOROTHIAZIDE 12.5MG: 12.5 | 30 days supply | Qty: 30 | Fill #4

## 2016-02-05 MED FILL — LEVOTHYROXINE 112 MCG TAB: 112 | 30 days supply | Qty: 30 | Fill #0

## 2016-02-05 MED FILL — TRAVEL SICKNESS 25 MG TAB C: 25 | 10 days supply | Qty: 30 | Fill #1

## 2016-02-05 MED FILL — LOSARTAN POTASSIUM 25 MG TA: 25 | 30 days supply | Qty: 30 | Fill #0

## 2016-03-01 ENCOUNTER — Ambulatory Visit: Payer: Self-pay | Attending: Family Medicine | Admitting: Family Medicine

## 2016-03-01 ENCOUNTER — Ambulatory Visit: Payer: Self-pay

## 2016-03-01 ENCOUNTER — Encounter: Payer: Self-pay | Admitting: Family Medicine

## 2016-03-01 VITALS — BP 122/86 | HR 73 | Temp 98.7°F | Resp 16 | Ht 65.0 in | Wt 191.6 lb

## 2016-03-01 DIAGNOSIS — M940 Chondrocostal junction syndrome [Tietze]: Secondary | ICD-10-CM

## 2016-03-01 DIAGNOSIS — E039 Hypothyroidism, unspecified: Secondary | ICD-10-CM | POA: Insufficient documentation

## 2016-03-01 DIAGNOSIS — E038 Other specified hypothyroidism: Secondary | ICD-10-CM

## 2016-03-01 DIAGNOSIS — M1990 Unspecified osteoarthritis, unspecified site: Secondary | ICD-10-CM | POA: Insufficient documentation

## 2016-03-01 DIAGNOSIS — Z79899 Other long term (current) drug therapy: Secondary | ICD-10-CM | POA: Insufficient documentation

## 2016-03-01 DIAGNOSIS — I10 Essential (primary) hypertension: Secondary | ICD-10-CM | POA: Insufficient documentation

## 2016-03-01 DIAGNOSIS — K219 Gastro-esophageal reflux disease without esophagitis: Secondary | ICD-10-CM | POA: Insufficient documentation

## 2016-03-01 DIAGNOSIS — F419 Anxiety disorder, unspecified: Secondary | ICD-10-CM | POA: Insufficient documentation

## 2016-03-01 DIAGNOSIS — M25512 Pain in left shoulder: Secondary | ICD-10-CM | POA: Insufficient documentation

## 2016-03-01 DIAGNOSIS — F411 Generalized anxiety disorder: Secondary | ICD-10-CM | POA: Insufficient documentation

## 2016-03-01 HISTORY — DX: Chondrocostal junction syndrome (tietze): M94.0

## 2016-03-01 MED ORDER — ONDANSETRON HCL 4 MG PO TABS: 4.0000 mg | ORAL_TABLET | Freq: Three times a day (TID) | ORAL | Status: DC | PRN

## 2016-03-01 MED ORDER — MECLIZINE HCL 25 MG PO TABS: 25.0000 mg | ORAL_TABLET | Freq: Three times a day (TID) | ORAL | Status: DC | PRN

## 2016-03-01 MED ORDER — LOSARTAN POTASSIUM 25 MG PO TABS: 25.0000 mg | ORAL_TABLET | Freq: Every day | ORAL | Status: DC

## 2016-03-01 MED ORDER — PANTOPRAZOLE SODIUM 40 MG PO TBEC: 40.0000 mg | DELAYED_RELEASE_TABLET | Freq: Every day | ORAL | Status: DC

## 2016-03-01 MED ORDER — HYDROCHLOROTHIAZIDE 12.5 MG PO TABS: 12.5000 mg | ORAL_TABLET | Freq: Every day | ORAL | Status: DC

## 2016-03-01 MED ORDER — MELOXICAM 15 MG PO TABS: 15.0000 mg | ORAL_TABLET | Freq: Every day | ORAL | Status: DC

## 2016-03-01 MED ORDER — CITALOPRAM HYDROBROMIDE 20 MG PO TABS: 20.0000 mg | ORAL_TABLET | Freq: Every day | ORAL | Status: DC

## 2016-03-01 MED ORDER — LEVOTHYROXINE SODIUM 112 MCG PO TABS: ORAL_TABLET | ORAL | Status: DC

## 2016-03-01 MED ORDER — CYCLOBENZAPRINE HCL 10 MG PO TABS: 10.0000 mg | ORAL_TABLET | Freq: Two times a day (BID) | ORAL | Status: DC | PRN

## 2016-03-01 MED FILL — ?CITALOPRAM HBR 20 MG TABLE: 20 | 30 days supply | Qty: 30 | Fill #0

## 2016-03-01 MED FILL — MELOXICAM 15 MG TABLET: 15 | 30 days supply | Qty: 30 | Fill #0

## 2016-03-01 MED FILL — ?ONDANSETRON HCL 4 MG TABLE: 4 | 7 days supply | Qty: 20 | Fill #0

## 2016-03-01 MED FILL — ?CYCLOBENZAPRINE 10 MG TABL: 10 | 30 days supply | Qty: 60 | Fill #0

## 2016-03-01 MED FILL — ?MECLIZINE 25 MG TABLET: 25 | 10 days supply | Qty: 30 | Fill #0

## 2016-03-01 NOTE — Progress Notes (Signed)
Patient's here to reestablish care.   Patient here for f/up Thyroid and Cholesterol check.  Patient requesting med refill. Patient states her Klonopin is not working.

## 2016-03-01 NOTE — Progress Notes (Signed)
Subjective:  Patient ID: Julie Miranda, female    DOB: [birth year]  Age: 55 y.o. MRN: [id number]  CC: Medication Refill; Establish Care; and Thyroid Problem   HPI Hermena Flaten is a 55 year old female with a history of GERD, hypothyroidism, hypertension, anxiety, multiple arthralgias, de Quervain's tenosynovitis who comes into the clinic for follow-up visit.  She has been compliant with her medications and he is fasting in anticipation of labs today. Continues to complain of reproducible chest pain, jaw pain, left shoulder pain and pain in the wrists and joints of her fingers. She is scheduled to see orthopedics tomorrow and states that meloxicam does not really help her symptoms. Jaw pain and chest pain occur around the same time and are not related to any activity.  She was previously on Klonopin for anxiety which she states helped her symptoms and has not taken any over the last few weeks. She is currently on Protonix for reflux and states she was taken off ranitidine because it was not available in the pharmacy; sometimes experiences nausea for which she uses Zofran.  She is  requesting refills .  Outpatient Prescriptions Prior to Visit  Medication Sig Dispense Refill  . APAP (TYLENOL) 325 MG tablet Take 2 tablets (650 mg total) by mouth every 12 (twelve) hours as needed for headache. 30 tablet 1  . docusate sodium 100 MG CAPS Take 100 mg by mouth 2 (two) times daily. 10 capsule 0  . lubiprostone (AMITIZA) 8 MCG capsule Take 1 capsule (8 mcg total) by mouth 3 (three) times daily with meals. 112 capsule 0  . Menthol, Topical Analgesic, (ICY HOT EX) Apply 1 application topically 2 (two) times daily as needed. por pain/spasms    . sucralfate (CARAFATE) 1 GM/10ML suspension Take 10 mLs (1 g total) by mouth 4 (four) times daily -  with meals and at bedtime. For ulcers 420 mL 0  . traMADol (ULTRAM) 50 MG tablet Take 1 tablet (50 mg total) by mouth every 8 (eight) hours as needed. 60 tablet 0   . TRAVEL SICKNESS 25 MG CHEW   1  . clonazePAM (KLONOPIN) 0.5 MG tablet Take 1 tablet (0.5 mg total) by mouth 2 (two) times daily as needed for anxiety. 30 tablet 1  . hydrochlorothiazide (HYDRODIURIL) 12.5 MG tablet Take 1 tablet (12.5 mg total) by mouth daily. 90 tablet 3  . levothyroxine (SYNTHROID, LEVOTHROID) 112 MCG tablet TAKE 1 TABLET BY MOUTH DAILY BEFORE BREAKFAST. 30 tablet 2  . losartan (COZAAR) 25 MG tablet TAKE 1 TABLET BY MOUTH DAILY. 30 tablet 2  . meclizine (ANTIVERT) 25 MG tablet Take 1 tablet (25 mg total) by mouth 3 (three) times daily as needed. 30 tablet 1  . meloxicam (MOBIC) 15 MG tablet Take 1 tablet (15 mg total) by mouth daily. 30 tablet 1  . ondansetron (ZOFRAN) 4 MG tablet Take 1 tablet (4 mg total) by mouth every 8 (eight) hours as needed for nausea. 20 tablet 0  . pantoprazole (PROTONIX) 40 MG tablet Take 1 tablet (40 mg total) by mouth daily. 90 tablet 3  . lubiprostone (AMITIZA) 24 MCG capsule Take 1 capsule (24 mcg total) by mouth 2 (two) times daily with a meal. (Patient not taking: Reported on 03/01/2016) 180 capsule 3  . lubiprostone (AMITIZA) 8 MCG capsule Take 1 capsule (8 mcg total) by mouth 2 (two) times daily with a meal. (Patient not taking: Reported on 03/01/2016) 90 capsule 3  . lubiprostone (AMITIZA) 8 MCG capsule Take 1 capsule (  8 mcg total) by mouth 3 (three) times daily with meals. (Patient not taking: Reported on 03/01/2016) 112 capsule 0   No facility-administered medications prior to visit.    ROS Review of Systems  Constitutional: Negative for activity change, appetite change and fatigue.  HENT: Negative for congestion, sinus pressure and sore throat.   Eyes: Negative for visual disturbance.  Respiratory: Negative for cough, chest tightness, shortness of breath and wheezing.   Cardiovascular: Positive for chest pain. Negative for palpitations.  Gastrointestinal: Negative for abdominal pain, constipation and abdominal distention.  Endocrine:  Negative for polydipsia.  Genitourinary: Negative for dysuria and frequency.  Musculoskeletal: Positive for arthralgias.       See hpi  Skin: Negative for rash.  Neurological: Negative for tremors, light-headedness and numbness.  Hematological: Does not bruise/bleed easily.  Psychiatric/Behavioral: Negative for behavioral problems and agitation.    Objective:  BP 122/86 mmHg  Pulse 73  Temp(Src) 98.7 F (37.1 C) (Oral)  Resp 16  Ht 5\' 5"  (1.651 m)  Wt 191 lb 9.6 oz (86.909 kg)  BMI 31.88 kg/m2  SpO2 100%  BP/Weight 03/01/2016 12/24/2015 [id number]  Systolic BP 123XX123 [id number]  Diastolic BP 86 77 87  Wt. (Lbs) 191.6 191.4 200  BMI 31.88 32.84 33.28      Physical Exam  Constitutional: She is oriented to person, place, and time. She appears well-developed and well-nourished.  Cardiovascular: Normal rate, normal heart sounds and intact distal pulses.   No murmur heard. Pulmonary/Chest: Effort normal and breath sounds normal. She has no wheezes. She has no rales. She exhibits tenderness (reproducible chest wall pain).  Abdominal: Soft. Bowel sounds are normal. She exhibits no distension and no mass. There is no tenderness.  Musculoskeletal:  Normal appearance of shoulders . Reduced range of motion in both shoulders; right limited to 80, left admitted to 60  Neurological: She is alert and oriented to person, place, and time.     Assessment & Plan:   1. Other specified hypothyroidism Controlled - levothyroxine (SYNTHROID, LEVOTHROID) 112 MCG tablet; TAKE 1 TABLET BY MOUTH DAILY BEFORE BREAKFAST.  Dispense: 30 tablet; Refill: 3 - TSH  2. Gastroesophageal reflux disease, esophagitis presence not specified Controlled - pantoprazole (PROTONIX) 40 MG tablet; Take 1 tablet (40 mg total) by mouth daily.  Dispense: 30 tablet; Refill: 3 - ondansetron (ZOFRAN) 4 MG tablet; Take 1 tablet (4 mg total) by mouth every 8 (eight) hours as needed for nausea.  Dispense: 20 tablet; Refill:  0  3. Essential hypertension, benign Controlled Low-sodium, DASH diet - losartan (COZAAR) 25 MG tablet; Take 1 tablet (25 mg total) by mouth daily.  Dispense: 30 tablet; Refill: 3 - hydrochlorothiazide (HYDRODIURIL) 12.5 MG tablet; Take 1 tablet (12.5 mg total) by mouth daily.  Dispense: 30 tablet; Refill: 3 - Lipid panel - COMPLETE METABOLIC PANEL WITH GFR  4. Left shoulder pain Scheduled to see orthopedics tomorrow - meloxicam (MOBIC) 15 MG tablet; Take 1 tablet (15 mg total) by mouth daily.  Dispense: 30 tablet; Refill: 1  5. Costochondritis Diagnosis explained to the patient. We'll also add muscle relaxant to regimen - meloxicam (MOBIC) 15 MG tablet; Take 1 tablet (15 mg total) by mouth daily.  Dispense: 30 tablet; Refill: 1  6. Arthritis This could explain pain in multiple joints Continue NSAIDs  7. Anxiety Discontinue Klonopin due to habit forming nature. - Celexa  Meds ordered this encounter  Medications  . levothyroxine (SYNTHROID, LEVOTHROID) 112 MCG tablet    Sig: TAKE 1 TABLET  BY MOUTH DAILY BEFORE BREAKFAST.    Dispense:  30 tablet    Refill:  3  . losartan (COZAAR) 25 MG tablet    Sig: Take 1 tablet (25 mg total) by mouth daily.    Dispense:  30 tablet    Refill:  3  . pantoprazole (PROTONIX) 40 MG tablet    Sig: Take 1 tablet (40 mg total) by mouth daily.    Dispense:  30 tablet    Refill:  3  . ondansetron (ZOFRAN) 4 MG tablet    Sig: Take 1 tablet (4 mg total) by mouth every 8 (eight) hours as needed for nausea.    Dispense:  20 tablet    Refill:  0  . meclizine (ANTIVERT) 25 MG tablet    Sig: Take 1 tablet (25 mg total) by mouth 3 (three) times daily as needed.    Dispense:  30 tablet    Refill:  3  . hydrochlorothiazide (HYDRODIURIL) 12.5 MG tablet    Sig: Take 1 tablet (12.5 mg total) by mouth daily.    Dispense:  30 tablet    Refill:  3  . meloxicam (MOBIC) 15 MG tablet    Sig: Take 1 tablet (15 mg total) by mouth daily.    Dispense:  30  tablet    Refill:  1    Follow-up: Return in about 3 months (around 06/01/2016) for Follow-up of hypothyroidism.   Arnoldo Morale MD

## 2016-03-01 NOTE — Patient Instructions (Signed)

## 2016-03-02 ENCOUNTER — Ambulatory Visit (INDEPENDENT_AMBULATORY_CARE_PROVIDER_SITE_OTHER): Payer: Self-pay | Admitting: Family Medicine

## 2016-03-02 ENCOUNTER — Encounter: Payer: Self-pay | Admitting: Family Medicine

## 2016-03-02 VITALS — BP 124/88 | HR 67 | Ht 65.0 in | Wt 189.0 lb

## 2016-03-02 DIAGNOSIS — M25512 Pain in left shoulder: Secondary | ICD-10-CM

## 2016-03-02 LAB — COMPLETE METABOLIC PANEL WITH GFR
ALBUMIN: 4.1 g/dL (ref 3.6–5.1)
ALK PHOS: 105 U/L (ref 33–130)
ALT: 42 U/L — ABNORMAL HIGH (ref 6–29)
AST: 31 U/L (ref 10–35)
BUN: 10 mg/dL (ref 7–25)
CO2: 30 mmol/L (ref 20–31)
Calcium: 9.9 mg/dL (ref 8.6–10.4)
Chloride: 101 mmol/L (ref 98–110)
Creat: 1 mg/dL (ref 0.50–1.05)
GFR, Est African American: 74 mL/min (ref >=60)
GFR, Est Non African American: 64 mL/min (ref >=60)
GLUCOSE: 84 mg/dL (ref 65–99)
POTASSIUM: 4 mmol/L (ref 3.5–5.3)
SODIUM: 139 mmol/L (ref 135–146)
TOTAL PROTEIN: 6.7 g/dL (ref 6.1–8.1)
Total Bilirubin: 0.5 mg/dL (ref 0.2–1.2)

## 2016-03-02 LAB — TSH: TSH: 11.29 mIU/L — ABNORMAL HIGH

## 2016-03-02 LAB — LIPID PANEL
CHOL/HDL RATIO: 2.8 ratio (ref <=5.0)
Cholesterol: 290 mg/dL — ABNORMAL HIGH (ref 125–200)
HDL: 105 mg/dL (ref >=46)
LDL Cholesterol: 159 mg/dL — ABNORMAL HIGH (ref <130)
Triglycerides: 130 mg/dL (ref <150)
VLDL: 26 mg/dL (ref <30)

## 2016-03-02 MED ORDER — METHYLPREDNISOLONE ACETATE 40 MG/ML IJ SUSP
40.0000 mg | Freq: Once | INTRAMUSCULAR | Status: AC
  Administered 2016-03-02: 40 mg via INTRA_ARTICULAR

## 2016-03-03 ENCOUNTER — Other Ambulatory Visit: Payer: Self-pay | Admitting: Family Medicine

## 2016-03-03 DIAGNOSIS — E7800 Pure hypercholesterolemia, unspecified: Secondary | ICD-10-CM

## 2016-03-03 DIAGNOSIS — E038 Other specified hypothyroidism: Secondary | ICD-10-CM

## 2016-03-03 HISTORY — DX: Pure hypercholesterolemia, unspecified: E78.00

## 2016-03-03 MED ORDER — ATORVASTATIN CALCIUM 20 MG PO TABS: 20.0000 mg | ORAL_TABLET | Freq: Every day | ORAL | Status: DC

## 2016-03-03 MED ORDER — LEVOTHYROXINE SODIUM 125 MCG PO TABS: ORAL_TABLET | ORAL | Status: DC

## 2016-03-03 MED FILL — ?LEVOTHYROXINE 125 MCG TABL: 125 | 30 days supply | Qty: 30 | Fill #0

## 2016-03-03 MED FILL — ?ATORVASTATIN 20 MG TABLET: 20 | 30 days supply | Qty: 30 | Fill #0

## 2016-03-08 DIAGNOSIS — M25511 Pain in right shoulder: Secondary | ICD-10-CM

## 2016-03-08 DIAGNOSIS — M25512 Pain in left shoulder: Secondary | ICD-10-CM

## 2016-03-08 HISTORY — DX: Pain in right shoulder: M25.511

## 2016-03-08 NOTE — Progress Notes (Signed)
Patient ID: Julie Miranda, female   DOB: 1961/05/20, 55 y.o.   MRN: [id number]  Julie Miranda - 55 y.o. female MRN [id number]  Date of birth: Sep 03, 1961    SUBJECTIVE:     Chief Complaint: Left shoulder pain  HPI: She has had pain in her left shoulder for the last 4-6 weeks. At time she's had bilateral shoulder pain but the left is always on the bothers her more. She works as a Theme park manager and thinks this contributes to her shoulder issues as a gets worse when she Hair for long. Shift time. She is right-hand dominant.  Pain is mostly in the posterior shoulder/trapezius area. Feels tight, she has some shooting pains down the upper arm but not further than elbow. She's had no numbness or tingling in her hands on either side. She's noted no incoordination. ROS:     No unusual weight change, fever, sweats, chills. No other unusual arthralgias or myalgias. The see history of present illness above for additional pertinent review of systems.  PERTINENT  PMH / PSH FH / / SH:  Past Medical, Surgical, Social, and Family History Reviewed & Updated in the EMR.  Pertinent findings include:  Hypertension, hyperlipidemia, hypothyroidism. She does not have personal history of diabetes mellitus.  OBJECTIVE: BP 124/88 mmHg  Pulse 67  Ht 5\' 5"  (1.651 m)  Wt 189 lb (85.73 kg)  BMI 31.45 kg/m2  Physical Exam:  Vital signs are reviewed. GEN.: Well-developed obese female no acute distress SHOULDERS: Full range of motion and intact strength in all planes the rotator cuff bilateral shoulders. On the left shoulder she has some mild pain with supraspinatus testing she has decreased internal rotation compared to the right. The left trapezius is tender to palpation. NECK: Full range of motion flexion extension. Normal lateral rotation. Negative Spurling's. NEURO: Intact sensation to soft touch bilateral hands. SKIN: Skin in the area of the shoulders and posterior neck is without any sign of rash, there is no increased  warmth, no erythema. VASCULAR, radial pulses 2+ bilaterally equal. HABITUS: Macromastia  ASSESSMENT & PLAN:  See problem based charting & AVS for pt instructions.

## 2016-03-08 NOTE — Assessment & Plan Note (Signed)
This seems musculoskeletal. I do not think there is necessarily a component of impingement. I suspect there is some contribution from macromastia. Placed her on a rotator cuff strengthening program and added some overhead press exercises to work on her trapezius. Also showed her some stretching exercises. She'll return to clinic 4 weeks for follow-up.

## 2016-04-02 ENCOUNTER — Telehealth: Payer: Self-pay | Admitting: Family Medicine

## 2016-04-02 MED FILL — ?HYDROCHLOROTHIAZIDE 12.5MG: 12.5 | 30 days supply | Qty: 30 | Fill #5

## 2016-04-02 MED FILL — ?LEVOTHYROXINE 125 MCG TABL: 125 | 30 days supply | Qty: 30 | Fill #1

## 2016-04-02 MED FILL — ?ATORVASTATIN 20 MG TABLET: 20 | 30 days supply | Qty: 30 | Fill #1

## 2016-04-02 MED FILL — MELOXICAM 15 MG TABLET: 15 | 30 days supply | Qty: 30 | Fill #1

## 2016-04-02 MED FILL — LOSARTAN POTASSIUM 25 MG TA: 25 | 30 days supply | Qty: 30 | Fill #1

## 2016-04-02 MED FILL — ?CITALOPRAM HBR 20 MG TABLE: 20 | 30 days supply | Qty: 30 | Fill #1

## 2016-04-02 MED FILL — ?PANTOPRAZOLE SOD DR 40MG: 40 MG | 30 days supply | Qty: 30 | Fill #5

## 2016-04-02 MED FILL — AMITIZA 24 MCG CAPSULES: 24 | 16 days supply | Qty: 32 | Fill #2

## 2016-04-02 NOTE — Telephone Encounter (Signed)
I will need to refer her to Psych for that if she is agreeable as I am unable to place her on Benzodiazepines.

## 2016-04-02 NOTE — Telephone Encounter (Signed)
Pt. Called requesting for her PCP to change her Celexa to clonazepam.  The Celexa is not helping her. Please f/u

## 2016-04-08 ENCOUNTER — Telehealth: Payer: Self-pay | Admitting: Family Medicine

## 2016-04-08 NOTE — Telephone Encounter (Signed)
Writer returned patient's call per Dr. Jarold Song;   Author Note Status Last Update User Last Update Date/Time   Arnoldo Morale, MD Signed Arnoldo Morale, MD 04/02/2016 5:12 PM    Telephone Encounter    Expand All Collapse All   I will need to refer her to Psych for that if she is agreeable as I am unable to place her on Benzodiazepines.         Writer explained that MD would place a psych referral but is unable to prescribe the requested medication.  Patient encouraged to continue on the celexa for another month to see if will possibly help with her anxiety.  Patient was agreeable to this and will call to update if she does need a referral.

## 2016-04-08 NOTE — Telephone Encounter (Signed)
Pt. Called requesting to speak to the nurse about her having her celexa changed. Please f/u with pt.

## 2016-04-09 ENCOUNTER — Ambulatory Visit: Payer: Self-pay | Admitting: Family Medicine

## 2016-04-16 ENCOUNTER — Ambulatory Visit (INDEPENDENT_AMBULATORY_CARE_PROVIDER_SITE_OTHER): Payer: Self-pay | Admitting: Family Medicine

## 2016-04-16 ENCOUNTER — Other Ambulatory Visit: Payer: Self-pay | Admitting: *Deleted

## 2016-04-16 ENCOUNTER — Encounter: Payer: Self-pay | Admitting: Family Medicine

## 2016-04-16 VITALS — BP 115/74 | Ht 65.0 in | Wt 185.0 lb

## 2016-04-16 DIAGNOSIS — M25511 Pain in right shoulder: Secondary | ICD-10-CM

## 2016-04-16 DIAGNOSIS — M25512 Pain in left shoulder: Secondary | ICD-10-CM

## 2016-04-16 MED ORDER — LUBIPROSTONE 8 MCG PO CAPS: 8.0000 ug | ORAL_CAPSULE | Freq: Three times a day (TID) | ORAL | Status: DC

## 2016-04-16 MED ORDER — METHYLPREDNISOLONE ACETATE 40 MG/ML IJ SUSP
40.0000 mg | Freq: Once | INTRAMUSCULAR | Status: AC
  Administered 2016-04-16: 40 mg via INTRA_ARTICULAR

## 2016-04-16 NOTE — Telephone Encounter (Signed)
PASS PROGRAM 

## 2016-04-16 NOTE — Progress Notes (Signed)
Patient ID: Julie Miranda, female   DOB: October 26, 1960, 55 y.o.   MRN: [id number] [place] Attending Note: I have seen and examined this patient with the medical student. I have  reviewed the history, physical examination, assessment and plan as documented in the medical student's note.  I agree with the medical student's note and findings, assessment and treatment plan as documented with the following additions or changes: Bilateral shoulder pain today. No weakness that would suggest rotator cuff tear. She is currently being worked up for some underlying autoimmune disease that has given her problems with her wrists and other joints. Problems with her shoulders started several months ago and after her left shoulder injection 3 months ago she had approximately 2 months of improvement. She was really able to work on her rehabilitation aggressively during this time. Over the last month she's had increased pain in the rehabilitation exercises have fallen off somewhat.  We did bilateral subacromial bursa injections today. I want her to be very aggressive with her rehabilitation program exercising 3 times a day with the exercise we gave her in handout form. I'll see her back in 6 weeks or so. I don't want to do chronic serial shoulder injection some hoping the aggressive rehabilitation program will be beneficial.

## 2016-04-16 NOTE — Progress Notes (Signed)
Patient ID: Julie Miranda, female   DOB: 1961-06-30, 55 y.o.   MRN: [id number]  CC: Bilateral shoulder pain  HPI: Julie Miranda is a 55 y.o. yo female with PMH of anemia, HTN, and bilateral De Quervain's tenosynovitis presenting with longstanding bilateral shoulder pain.  Pt states has had shoulder pain for a long time, L is worse than R. Pain is an ache, sometimes burning, that runs from the top of her shoulder sometimes all the way down to her wrist.  Mild pain at rest, much worse with movements overhead or out to side, has noticed she is dropping things, although attributes this to previously diagnosed De Quervain's.  Had her L shoulder injected 3 months ago, got about 1 month of good relief and a second month of mild relief.  Only treatments tried are OTC pain medication and Icy Hot/warm towel wraps with minimal relief.  Interested in treatment options today.  ROS: Gen: -  No fevers/chills No unusual weight change  PERTINENT  PMH / PSH: I have reviewed the patient's medications, allergies, past medical and surgical history. Pertinent findings that relate to today's visit / issues include: Non smoker ? Of underlying rheumatological or autoimmune dysfunction  PE: BP 115/74 mmHg  Ht 5\' 5"  (1.651 m)  Wt 185 lb (83.915 kg)  BMI 30.79 kg/m2 Gen: 55 y.o. yo female sitting one exam table in NAD HEENT: normocephalic, atraumatic Pulm: normal work of breathing MSK:  RUE: inspection of surface bony/articular anatomy and overlying skin wnl, + TTP over superior, lateral, and anterior joint surfaces, full active ROM although with pain, strength 4/5 shoulder abduction, 5/5 biceps/triceps, 4/5 handgrip, + empty can, Hawkings, painful arc LUE: inspection of surface bony/articular anatomy and overlying skin wnl, + TTP over superior, lateral, and anterior surface of joint, full active ROM although with pain, strength 4/5 shoulder abduction, 5/5 bicpes/triceps, 5/5 handgrip, + empty can, Hawkings, painful  arc Gait: normal and unhindered  A/P: Julie Miranda is a 55 y.o. yo female with PMH per above presenting with likely bilateral partial rotator cuff tears.    Bilateral partial rotator cuff tears Diagnosed based on description of symptoms and PE.  Likely 2/2 wear/tear  -- Bilateral subacromial bursa corticone injections in office today -- Counseled pt that they may feel worse over next 24 hours, but should feel better for potentially several months -- Advised continued NSAIDs PRN at home -- Do not need imaging at this time   Follow-up: PRN  Nils Flack, MS4

## 2016-05-05 ENCOUNTER — Other Ambulatory Visit: Payer: Self-pay | Admitting: Family Medicine

## 2016-05-05 MED FILL — LOSARTAN POTASSIUM 25 MG TA: 25 | 30 days supply | Qty: 30 | Fill #2

## 2016-05-05 MED FILL — ?MECLIZINE 25 MG TABLET: 25 | 10 days supply | Qty: 30 | Fill #1

## 2016-05-05 MED FILL — ?CYCLOBENZAPRINE 10 MG TABL: 10 | 30 days supply | Qty: 60 | Fill #0

## 2016-05-05 MED FILL — ?HYDROCHLOROTHIAZIDE 12.5MG: 12.5 | 30 days supply | Qty: 30 | Fill #6

## 2016-05-05 MED FILL — CITALOPRAM HBR 20 MG TABLET: 20 | 30 days supply | Qty: 30 | Fill #2

## 2016-05-05 MED FILL — ?PANTOPRAZOLE SOD DR 40MG: 40 MG | 30 days supply | Qty: 30 | Fill #6

## 2016-05-05 MED FILL — MELOXICAM 15 MG TABLET: 15 | 30 days supply | Qty: 30 | Fill #0

## 2016-05-05 MED FILL — ?ATORVASTATIN 20 MG TABLET: 20 | 30 days supply | Qty: 30 | Fill #2

## 2016-05-12 ENCOUNTER — Other Ambulatory Visit: Payer: Self-pay | Admitting: Family Medicine

## 2016-05-12 MED FILL — ?LEVOTHYROXINE 125 MCG TABL: 125 | 30 days supply | Qty: 30 | Fill #0

## 2016-05-12 NOTE — Telephone Encounter (Signed)
Patient requesting levothyroxine

## 2016-05-21 ENCOUNTER — Ambulatory Visit (INDEPENDENT_AMBULATORY_CARE_PROVIDER_SITE_OTHER): Payer: Self-pay | Admitting: Family Medicine

## 2016-05-21 ENCOUNTER — Encounter: Payer: Self-pay | Admitting: Family Medicine

## 2016-05-21 VITALS — BP 112/90 | HR 82 | Ht 65.0 in | Wt 185.0 lb

## 2016-05-21 DIAGNOSIS — M65819 Other synovitis and tenosynovitis, unspecified shoulder: Secondary | ICD-10-CM | POA: Insufficient documentation

## 2016-05-21 DIAGNOSIS — M25511 Pain in right shoulder: Secondary | ICD-10-CM

## 2016-05-21 DIAGNOSIS — M25512 Pain in left shoulder: Secondary | ICD-10-CM

## 2016-05-21 DIAGNOSIS — M659 Synovitis and tenosynovitis, unspecified: Secondary | ICD-10-CM | POA: Insufficient documentation

## 2016-05-21 DIAGNOSIS — M65919 Unspecified synovitis and tenosynovitis, unspecified shoulder: Secondary | ICD-10-CM

## 2016-05-21 HISTORY — DX: Synovitis and tenosynovitis, unspecified: M65.9

## 2016-05-21 HISTORY — DX: Unspecified synovitis and tenosynovitis, unspecified shoulder: M65.919

## 2016-05-21 MED ORDER — NITROGLYCERIN 0.2 MG/HR TD PT24: MEDICATED_PATCH | TRANSDERMAL | 1 refills | Status: DC

## 2016-05-21 MED ORDER — MELOXICAM 7.5 MG PO TABS: 7.5000 mg | ORAL_TABLET | Freq: Every day | ORAL | 0 refills | Status: DC

## 2016-05-21 MED FILL — NITROGLYCERIN 0.2 MG/HR PAT: 0.2 | 30 days supply | Qty: 8 | Fill #0

## 2016-05-21 NOTE — Progress Notes (Signed)
[facility]: Attending Note: I have reviewed the chart, discussed wit the Sports Medicine Fellow. I agree with assessment and treatment plan as detailed in the [place] note. Increased pain in LEFT shoulder after episode of helping her disabled sister get into auto and sister falling. This occurred last week.   Her shoulder US shows some fluid evident in [facility] joint space consistent with labral pathology. No obvious RC pathology although muscle tissue is not robust.  Discussed options. Will try NTG patch, HEP and rtc 3-4 weeks. If not improving would conisder [facility] joint injection.  I suspect she has some underlying connective tissuenissues which maybe playing a part in her multiple arthralgias.

## 2016-05-21 NOTE — Progress Notes (Signed)
  Julie Miranda - 55 y.o. female MRN [id number]  Date of birth: [birth year]  SUBJECTIVE:  Including CC & ROS.  CC: bilateral shoulder pain Julie Miranda presents with bilateral shoulder pain left greater than right. It is doing well after her bilateral injections month ago until about last week. She has a sister that is wheelchair-bound and she had to lift her and felt as though she hurt her shoulder doing so. She is trying to lift with her left shoulder. She states that her right one does hurt her more from time to time. She denies any swelling or numbness or tingling of her distal arm. She has significantly decreased range of motion.  There is worse with movement and better with rest.    ROS: No unexpected weight loss, fever, chills, swelling, instability, muscle pain, numbness/tingling, redness, otherwise see HPI   HISTORY: Past Medical, Surgical, Social, and Family History Reviewed & Updated per EMR.   Pertinent Historical Findings include: PMHx -  hypertension, hypothyroidism PSHx -  noncontributory FHx -  connective tissue disease in mother Medications -Synthroid, lisinopril, Zantac, Celexa, Lipitor  DATA REVIEWED: Previous records  PHYSICAL EXAM:  VS: BP:112/90  HR:82bpm  TEMP: ( )  RESP:   HT:5\' 5"  (165.1 cm)   WT:185 lb (83.9 kg)  BMI:30.8 PHYSICAL EXAM: Gen: NAD, alert, cooperative with exam, well-appearing HEENT: clear conjunctiva,  CV:  no edema, capillary refill brisk, normal rate Resp: non-labored Skin: no rashes, normal turgor  Neuro: no gross deficits.  Psych:  alert and oriented  Shoulder: Inspection reveals no abnormalities, atrophy or asymmetry. Palpation shows mild diffuse tenderness surrounding shoulder, but no point tenderness in bilateral shoulders. ROM reveals flexion to 75 abduction to 80 but does have full internal and external rotation of her left shoulder. However with extension and internal rotation she can only get to her lateral glute. Right shoulder  shows flexion to 160 and abduction to 160 as well.  she has full internal and external rotation of her right shoulder. Rotator cuff strengthdifficult to examine secondary to pain, does have strength with internal and and external  rotation of her left shoulder Positive impingement signs with positive Neer and Hawkin's tests, empty can sign. Speeds and Yergason's tests normal. No drop arm sign.  Limited ultrasound of the left shoulder: Biceps tendon reviewed in long and short axis did not show any evidence of swelling or hypoechoic changes. Subscapularis appeared intact with no hypoechoic changes. Supraspinatus is also intact without any hypoechoic changes or swelling. Labrum was visualized and did show some fluid surrounding the posterior aspect. Findings consistent with possible labral pathology.   ASSESSMENT & PLAN:   Synovitis of shoulder Patient likely exacerbated from trying to lift her sister. Patient gives patient nitroglycerin patches. Gave exercises for internal and external rotation to help strengthen her rotator cuff. Also gave Mobic to take as needed for anti-inflammatory.  We'll reassess in 3 weeks and see if further investigation needs to occur to investigate her labrum.

## 2016-05-21 NOTE — Assessment & Plan Note (Addendum)
Patient likely exacerbated from trying to lift her sister. Patient gives patient nitroglycerin patches. Gave exercises for internal and external rotation to help strengthen her rotator cuff. Also gave Mobic to take as needed for anti-inflammatory.  We'll reassess in 3 weeks and see if further investigation needs to occur to investigate her labrum.

## 2016-06-11 ENCOUNTER — Ambulatory Visit (INDEPENDENT_AMBULATORY_CARE_PROVIDER_SITE_OTHER): Payer: Self-pay | Admitting: Family Medicine

## 2016-06-11 ENCOUNTER — Encounter: Payer: Self-pay | Admitting: Family Medicine

## 2016-06-11 DIAGNOSIS — M25511 Pain in right shoulder: Secondary | ICD-10-CM

## 2016-06-11 DIAGNOSIS — M25512 Pain in left shoulder: Secondary | ICD-10-CM

## 2016-06-11 MED ORDER — METHYLPREDNISOLONE ACETATE 80 MG/ML IJ SUSP
80.0000 mg | Freq: Once | INTRAMUSCULAR | Status: AC
  Administered 2016-06-11: 80 mg via INTRA_ARTICULAR

## 2016-06-11 NOTE — Progress Notes (Signed)
    CHIEF COMPLAINT / HPI:   Left shoulder pain Alert her on nitroglycerin patch which she has been using regularly. Has had some 15-20% improvement in her range of motion. Still has a lot of pain. Is actively doing a lot of job hunting and would like to get rid of some of the shoulder pain as she thinks it would help her chances of getting a new job if she could perform more labor-type activities. Pain continues to bother her at night. Located left shoulder. She's having some right shoulder pain but not as significant as that in the left. No radiation into the arm. Pain is aching in nature, 4-6 out of 10. No problems with symmetrical as her patch.  REVIEW OF SYSTEMS:  No unusual weight change, fever, sweats, chills. No skin rash from the nitroglycerin patch. She has had a few headaches associated with that and has had one or 2 episodes of dizziness when she still up quickly but otherwise has done well.  OBJECTIVE:  Vital signs are reviewed.   GEN.: Well-developed female no acute distress SHOULDER: Left. Range of motion in abduction is painless of 120, painful above that. Range of motion is full. Rotator cuff strength intact in all planes.  INJECTION: Patient was given informed consent, signed copy in the chart. Appropriate time out was taken. Area prepped and draped in usual sterile fashion. 1 cc of methylprednisolone 80 mg/ml plus  4 cc of 1% lidocaine without epinephrine was injected into the glenohumeral joint using a(n) ultrasound-guided approach. The patient tolerated the procedure well. There were no complications. Post procedure instructions were given.   ASSESSMENT / PLAN: Please see problem oriented charting for details

## 2016-06-11 NOTE — Assessment & Plan Note (Signed)
Some small amount of improvement in range of motion with that glycerin patch. Glenohumeral joint injection today to see if that will give her a boost. Continue nitroglycerin patch and home exercise program. Follow-up 4-6 weeks.

## 2016-07-06 ENCOUNTER — Other Ambulatory Visit: Payer: Self-pay | Admitting: Family Medicine

## 2016-07-06 MED FILL — ?MECLIZINE 25 MG TABLET: 25 | 10 days supply | Qty: 30 | Fill #2

## 2016-07-06 MED FILL — ?CYCLOBENZAPRINE 10 MG TABL: 10 | 30 days supply | Qty: 60 | Fill #0

## 2016-07-06 MED FILL — ATORVASTATIN 20 MG TABLET: 20 | 30 days supply | Qty: 30 | Fill #3

## 2016-07-06 MED FILL — ?CITALOPRAM HBR 20 MG TABLE: 20 | 30 days supply | Qty: 30 | Fill #3

## 2016-07-06 MED FILL — ?LEVOTHYROXINE 125 MCG TABL: 125 | 30 days supply | Qty: 30 | Fill #0

## 2016-07-06 MED FILL — LOSARTAN POTASSIUM 25 MG TA: 25 | 30 days supply | Qty: 30 | Fill #3

## 2016-07-06 MED FILL — ?PANTOPRAZOLE SOD DR 40MG: 40 MG | 30 days supply | Qty: 30 | Fill #0

## 2016-07-06 MED FILL — MELOXICAM 15 MG TABLET: 15 | 30 days supply | Qty: 30 | Fill #0

## 2016-07-06 MED FILL — NITROGLYCERIN 0.2 MG/HR PAT: 0.2 | 30 days supply | Qty: 8 | Fill #1

## 2016-07-06 MED FILL — HYDROCHLOROTHIAZIDE 12.5 MG: 12.5 | 30 days supply | Qty: 30 | Fill #0

## 2016-07-07 ENCOUNTER — Other Ambulatory Visit: Payer: Self-pay | Admitting: *Deleted

## 2016-07-07 ENCOUNTER — Telehealth: Payer: Self-pay | Admitting: *Deleted

## 2016-07-07 DIAGNOSIS — M25512 Pain in left shoulder: Secondary | ICD-10-CM

## 2016-07-07 NOTE — Telephone Encounter (Signed)
Order placed for MRI of the left shoulder

## 2016-07-09 ENCOUNTER — Ambulatory Visit: Payer: Self-pay | Admitting: Family Medicine

## 2016-07-11 ENCOUNTER — Ambulatory Visit
Admission: RE | Admit: 2016-07-11 | Discharge: 2016-07-11 | Disposition: A | Discharge status: Home / Self Care | Payer: Self-pay | Source: Ambulatory Visit | Attending: Family Medicine | Admitting: Family Medicine

## 2016-07-11 DIAGNOSIS — M25512 Pain in left shoulder: Secondary | ICD-10-CM

## 2016-07-16 ENCOUNTER — Encounter: Payer: Self-pay | Admitting: Family Medicine

## 2016-07-26 ENCOUNTER — Encounter: Payer: Self-pay | Admitting: Family Medicine

## 2016-07-26 ENCOUNTER — Ambulatory Visit: Payer: No Typology Code available for payment source | Attending: Family Medicine | Admitting: Family Medicine

## 2016-07-26 VITALS — BP 113/79 | HR 86 | Temp 98.3°F | Ht 66.0 in | Wt 199.6 lb

## 2016-07-26 DIAGNOSIS — Z23 Encounter for immunization: Secondary | ICD-10-CM

## 2016-07-26 DIAGNOSIS — R110 Nausea: Secondary | ICD-10-CM | POA: Insufficient documentation

## 2016-07-26 DIAGNOSIS — E7800 Pure hypercholesterolemia, unspecified: Secondary | ICD-10-CM

## 2016-07-26 DIAGNOSIS — E038 Other specified hypothyroidism: Secondary | ICD-10-CM

## 2016-07-26 DIAGNOSIS — I10 Essential (primary) hypertension: Secondary | ICD-10-CM

## 2016-07-26 DIAGNOSIS — F411 Generalized anxiety disorder: Secondary | ICD-10-CM

## 2016-07-26 DIAGNOSIS — Z9884 Bariatric surgery status: Secondary | ICD-10-CM | POA: Insufficient documentation

## 2016-07-26 DIAGNOSIS — K5909 Other constipation: Secondary | ICD-10-CM

## 2016-07-26 DIAGNOSIS — K219 Gastro-esophageal reflux disease without esophagitis: Secondary | ICD-10-CM

## 2016-07-26 DIAGNOSIS — Z8719 Personal history of other diseases of the digestive system: Secondary | ICD-10-CM | POA: Insufficient documentation

## 2016-07-26 DIAGNOSIS — H8113 Benign paroxysmal vertigo, bilateral: Secondary | ICD-10-CM

## 2016-07-26 DIAGNOSIS — Z79899 Other long term (current) drug therapy: Secondary | ICD-10-CM | POA: Insufficient documentation

## 2016-07-26 DIAGNOSIS — Z111 Encounter for screening for respiratory tuberculosis: Secondary | ICD-10-CM

## 2016-07-26 DIAGNOSIS — H8110 Benign paroxysmal vertigo, unspecified ear: Secondary | ICD-10-CM | POA: Insufficient documentation

## 2016-07-26 DIAGNOSIS — Z885 Allergy status to narcotic agent status: Secondary | ICD-10-CM | POA: Insufficient documentation

## 2016-07-26 LAB — TSH: TSH: 3.22 mIU/L

## 2016-07-26 MED ORDER — BUSPIRONE HCL 7.5 MG PO TABS: 7.5000 mg | ORAL_TABLET | Freq: Two times a day (BID) | ORAL | 3 refills | Status: DC

## 2016-07-26 MED ORDER — PANTOPRAZOLE SODIUM 40 MG PO TBEC: 40.0000 mg | DELAYED_RELEASE_TABLET | Freq: Every day | ORAL | 3 refills | Status: DC

## 2016-07-26 MED ORDER — ONDANSETRON HCL 4 MG PO TABS
4.0000 mg | ORAL_TABLET | Freq: Three times a day (TID) | ORAL | 0 refills | Status: DC | PRN
Start: 2016-07-26 — End: 2017-07-26

## 2016-07-26 MED ORDER — LOSARTAN POTASSIUM 25 MG PO TABS: 25.0000 mg | ORAL_TABLET | Freq: Every day | ORAL | 3 refills | Status: DC

## 2016-07-26 MED ORDER — ATORVASTATIN CALCIUM 20 MG PO TABS: 20.0000 mg | ORAL_TABLET | Freq: Every day | ORAL | 3 refills | Status: DC

## 2016-07-26 MED ORDER — LACTULOSE 10 GM/15ML PO SOLN: 10.0000 g | Freq: Two times a day (BID) | ORAL | 2 refills | Status: DC | PRN

## 2016-07-26 MED ORDER — LEVOTHYROXINE SODIUM 125 MCG PO TABS: ORAL_TABLET | ORAL | 3 refills | Status: DC

## 2016-07-26 MED ORDER — MECLIZINE HCL 25 MG PO TABS: 25.0000 mg | ORAL_TABLET | Freq: Three times a day (TID) | ORAL | 3 refills | Status: DC | PRN

## 2016-07-26 MED ORDER — CITALOPRAM HYDROBROMIDE 20 MG PO TABS
20.0000 mg | ORAL_TABLET | Freq: Every day | ORAL | 3 refills | Status: DC
Start: 2016-07-26 — End: 2017-04-25

## 2016-07-26 MED ORDER — HYDROCHLOROTHIAZIDE 12.5 MG PO TABS: 12.5000 mg | ORAL_TABLET | Freq: Every day | ORAL | 3 refills | Status: DC

## 2016-07-26 MED FILL — LACTULOSE 10 GM/15 ML SOLN: 10 | 30 days supply | Qty: 946 | Fill #0

## 2016-07-26 MED FILL — ?ONDANSETRON HCL 4 MG TABLE: 4 | 7 days supply | Qty: 20 | Fill #0

## 2016-07-26 MED FILL — busPIRone HCL 7.5 MG TABS: 7.5 | 30 days supply | Qty: 60 | Fill #0

## 2016-07-26 MED FILL — TRAVEL SICKNESS 25 MG TAB C: 25 | 10 days supply | Qty: 30 | Fill #0

## 2016-07-26 NOTE — Progress Notes (Signed)
Subjective:  Patient ID: Julie Miranda, female    DOB: 1961-10-23  Age: 55 y.o. MRN: [id number]  CC: Hypertension; Hypothyroidism; Anxiety (nervousness); headaches; Palpitations; Dizziness; and Nausea   HPI Julie Miranda is a 55 year old female with a history of GERD, hypothyroidism, hypertension, anxiety, multiple arthralgias, de Quervain's tenosynovitis who comes into the clinic for follow-up visit.  She was recently seen by Dr. Nori Riis where she received left shoulder cortisone shot and has been using nitroglycerin patch for pain but she states she has not noticed any much improvement.  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 She had been on Klonopin in the past which was discontinued. She has a history of generalized anxiety. Would like to have a PPD placed, flu shot.  Also remains constipated despite using MiraLAX and modifying her diet. Endorses nausea which was previously intermittent but is now constant and is requesting a refill of Zofran. She does have vertigo and is requesting refill of meclizine.  Past Medical History:  Diagnosis Date  . Gastric ulcer   . GERD (gastroesophageal reflux disease)   . Hypertension   . Thyroid disease     Past Surgical History:  Procedure Laterality Date  . GASTRIC BYPASS      Allergies  Allergen Reactions  . Morphine And Related Nausea And Vomiting     Outpatient Medications Prior to Visit  Medication Sig Dispense Refill  . AMITIZA 24 MCG capsule   2  . APAP (TYLENOL) 325 MG tablet Take 2 tablets (650 mg total) by mouth every 12 (twelve) hours as needed for headache. 30 tablet 1  . citalopram (CELEXA) 20 MG tablet Take 1 tablet (20 mg total) by mouth daily. 30 tablet 3  . clonazePAM (KLONOPIN) 0.5 MG tablet   1  . Menthol, Topical Analgesic, (ICY HOT EX) Apply 1 application topically 2 (two) times daily as needed. por pain/spasms    . nitroGLYCERIN (NITRODUR -  DOSED IN MG/24 HR) 0.2 mg/hr patch Use 1/4 patch daily to the affected area 30 patch 1  . atorvastatin (LIPITOR) 20 MG tablet Take 1 tablet (20 mg total) by mouth daily. 30 tablet 3  . cyclobenzaprine (FLEXERIL) 10 MG tablet TAKE 1 TABLET BY MOUTH 2 TIMES DAILY AS NEEDED FOR MUSCLE SPASMS. 60 tablet 0  . hydrochlorothiazide (HYDRODIURIL) 12.5 MG tablet Take 1 tablet (12.5 mg total) by mouth daily. 30 tablet 3  . levothyroxine (SYNTHROID, LEVOTHROID) 125 MCG tablet TAKE 1 TABLET BY MOUTH DAILY BEFORE BREAKFAST. 30 tablet 2  . lisinopril (PRINIVIL,ZESTRIL) 10 MG tablet   3  . losartan (COZAAR) 25 MG tablet Take 1 tablet (25 mg total) by mouth daily. 30 tablet 3  . meclizine (ANTIVERT) 25 MG tablet Take 1 tablet (25 mg total) by mouth 3 (three) times daily as needed. 30 tablet 3  . meloxicam (MOBIC) 15 MG tablet TAKE 1 TABLET BY MOUTH DAILY. 30 tablet 0  . ondansetron (ZOFRAN) 4 MG tablet Take 1 tablet (4 mg total) by mouth every 8 (eight) hours as needed for nausea. 20 tablet 0  . pantoprazole (PROTONIX) 40 MG tablet Take 1 tablet (40 mg total) by mouth daily. 30 tablet 3  . sucralfate (CARAFATE) 1 GM/10ML suspension Take 10 mLs (1 g total) by mouth 4 (four) times daily -  with meals and at bedtime. For ulcers 420 mL 0  . docusate sodium 100 MG CAPS Take 100 mg by mouth 2 (two) times daily. (Patient  not taking: Reported on 07/26/2016) 10 capsule 0  . traMADol (ULTRAM) 50 MG tablet Take 1 tablet (50 mg total) by mouth every 8 (eight) hours as needed. (Patient not taking: Reported on 07/26/2016) 60 tablet 0  . TRAVEL SICKNESS 25 MG CHEW   1  . lubiprostone (AMITIZA) 8 MCG capsule Take 1 capsule (8 mcg total) by mouth 3 (three) times daily with meals. 270 capsule 3  . meloxicam (MOBIC) 7.5 MG tablet Take 1 tablet (7.5 mg total) by mouth daily. 30 tablet 0  . ranitidine (ZANTAC) 150 MG tablet   5   No facility-administered medications prior to visit.     ROS Review of Systems  Constitutional:  Negative for activity change, appetite change and fatigue.  HENT: Negative for congestion, sinus pressure and sore throat.   Eyes: Negative for visual disturbance.  Respiratory: Negative for cough, chest tightness, shortness of breath and wheezing.   Cardiovascular: Negative for chest pain and palpitations.  Gastrointestinal: Positive for constipation and nausea. Negative for abdominal distention and abdominal pain.  Endocrine: Negative for polydipsia.  Genitourinary: Negative for dysuria and frequency.  Musculoskeletal: Negative for arthralgias and back pain.  Skin: Negative for rash.  Neurological: Positive for dizziness. Negative for tremors, light-headedness and numbness.  Hematological: Does not bruise/bleed easily.  Psychiatric/Behavioral: Negative for agitation and behavioral problems. The patient is nervous/anxious.     Objective:  BP 113/79 (BP Location: Right Arm, Patient Position: Sitting, Cuff Size: Large)   Pulse 86   Temp 98.3 F (36.8 C) (Oral)   Ht 5\' 6"  (1.676 m)   Wt 199 lb 9.6 oz (90.5 kg)   SpO2 100%   BMI 32.22 kg/m   BP/Weight 07/26/2016 06/11/2016 [id number]  Systolic BP 123456 123456 [id number]  Diastolic BP 79 78 90  Wt. (Lbs) 199.6 - 185  BMI 32.22 - 30.79      Physical Exam  Constitutional: She is oriented to person, place, and time. She appears well-developed and well-nourished.  Cardiovascular: Normal rate, normal heart sounds and intact distal pulses.   No murmur heard. Pulmonary/Chest: Effort normal and breath sounds normal. She has no wheezes. She has no rales. She exhibits no tenderness.  Abdominal: Soft. Bowel sounds are normal. She exhibits no distension and no mass. There is no tenderness.  Musculoskeletal: Normal range of motion.  Neurological: She is alert and oriented to person, place, and time.  Psychiatric:  Anxious; she continues to rock from front to back and sideways throughout the whole encounter.    Assessment & Plan:   1. Essential  hypertension, benign Controlled - hydrochlorothiazide (HYDRODIURIL) 12.5 MG tablet; Take 1 tablet (12.5 mg total) by mouth daily.  Dispense: 30 tablet; Refill: 3 - losartan (COZAAR) 25 MG tablet; Take 1 tablet (25 mg total) by mouth daily.  Dispense: 30 tablet; Refill: 3  2. Gastroesophageal reflux disease, esophagitis presence not specified Uncontrolled Patient still experiences nausea Refill Zofran, continue PPI - pantoprazole (PROTONIX) 40 MG tablet; Take 1 tablet (40 mg total) by mouth daily.  Dispense: 30 tablet; Refill: 3 - ondansetron (ZOFRAN) 4 MG tablet; Take 1 tablet (4 mg total) by mouth every 8 (eight) hours as needed for nausea.  Dispense: 20 tablet; Refill: 0  3. Pure hypercholesterolemia Controlled - atorvastatin (LIPITOR) 20 MG tablet; Take 1 tablet (20 mg total) by mouth daily.  Dispense: 30 tablet; Refill: 3  4. Other specified hypothyroidism Uncontrolled - levothyroxine (SYNTHROID, LEVOTHROID) 125 MCG tablet; TAKE 1 TABLET BY MOUTH DAILY BEFORE BREAKFAST.  Dispense: 30 tablet; Refill: 3 - TSH - Flu Vaccine QUAD 36+ mos IM  5. Other constipation Discussed dietary modifications to increase fiber intake Switch from MiraLAX and lactulose. - lactulose (CHRONULAC) 10 GM/15ML solution; Take 15 mLs (10 g total) by mouth 2 (two) times daily as needed for mild constipation.  Dispense: 946 mL; Refill: 2  6. Generalized anxiety disorder Uncontrolled Continue Celexa meanwhile will add BuSpar Community resources provided for counseling in the community. Will need to be evaluated for mood disorder; if symptoms persist will consider referring to [facility] she has no medical coverage for referral to private psychiatrist. - busPIRone (BUSPAR) 7.5 MG tablet; Take 1 tablet (7.5 mg total) by mouth 2 (two) times daily.  Dispense: 60 tablet; Refill: 3  7. Encounter for immunization - hydrochlorothiazide (HYDRODIURIL) 12.5 MG tablet; Take 1 tablet (12.5 mg total) by mouth daily.   Dispense: 30 tablet; Refill: 3 - losartan (COZAAR) 25 MG tablet; Take 1 tablet (25 mg total) by mouth daily.  Dispense: 30 tablet; Refill: 3 - meclizine (ANTIVERT) 25 MG tablet; Take 1 tablet (25 mg total) by mouth 3 (three) times daily as needed.  Dispense: 30 tablet; Refill: 3 - pantoprazole (PROTONIX) 40 MG tablet; Take 1 tablet (40 mg total) by mouth daily.  Dispense: 30 tablet; Refill: 3 - ondansetron (ZOFRAN) 4 MG tablet; Take 1 tablet (4 mg total) by mouth every 8 (eight) hours as needed for nausea.  Dispense: 20 tablet; Refill: 0 - atorvastatin (LIPITOR) 20 MG tablet; Take 1 tablet (20 mg total) by mouth daily.  Dispense: 30 tablet; Refill: 3 - levothyroxine (SYNTHROID, LEVOTHROID) 125 MCG tablet; TAKE 1 TABLET BY MOUTH DAILY BEFORE BREAKFAST.  Dispense: 30 tablet; Refill: 3 - busPIRone (BUSPAR) 7.5 MG tablet; Take 1 tablet (7.5 mg total) by mouth 2 (two) times daily.  Dispense: 60 tablet; Refill: 3 - TSH - lactulose (CHRONULAC) 10 GM/15ML solution; Take 15 mLs (10 g total) by mouth 2 (two) times daily as needed for mild constipation.  Dispense: 946 mL; Refill: 2 - Flu Vaccine QUAD 36+ mos IM - PPD  8. Screening for tuberculosis - PPD  9. Benign paroxysmal positional vertigo due to bilateral vestibular disorder - meclizine (ANTIVERT) 25 MG tablet; Take 1 tablet (25 mg total) by mouth 3 (three) times daily as needed.  Dispense: 30 tablet; Refill: 3   Meds ordered this encounter  Medications  . hydrochlorothiazide (HYDRODIURIL) 12.5 MG tablet    Sig: Take 1 tablet (12.5 mg total) by mouth daily.    Dispense:  30 tablet    Refill:  3  . losartan (COZAAR) 25 MG tablet    Sig: Take 1 tablet (25 mg total) by mouth daily.    Dispense:  30 tablet    Refill:  3  . meclizine (ANTIVERT) 25 MG tablet    Sig: Take 1 tablet (25 mg total) by mouth 3 (three) times daily as needed.    Dispense:  30 tablet    Refill:  3  . pantoprazole (PROTONIX) 40 MG tablet    Sig: Take 1 tablet (40 mg  total) by mouth daily.    Dispense:  30 tablet    Refill:  3  . ondansetron (ZOFRAN) 4 MG tablet    Sig: Take 1 tablet (4 mg total) by mouth every 8 (eight) hours as needed for nausea.    Dispense:  20 tablet    Refill:  0  . atorvastatin (LIPITOR) 20 MG tablet    Sig: Take  1 tablet (20 mg total) by mouth daily.    Dispense:  30 tablet    Refill:  3  . levothyroxine (SYNTHROID, LEVOTHROID) 125 MCG tablet    Sig: TAKE 1 TABLET BY MOUTH DAILY BEFORE BREAKFAST.    Dispense:  30 tablet    Refill:  3  . busPIRone (BUSPAR) 7.5 MG tablet    Sig: Take 1 tablet (7.5 mg total) by mouth 2 (two) times daily.    Dispense:  60 tablet    Refill:  3  . lactulose (CHRONULAC) 10 GM/15ML solution    Sig: Take 15 mLs (10 g total) by mouth 2 (two) times daily as needed for mild constipation.    Dispense:  946 mL    Refill:  2    Follow-up: Return in about 3 months (around 10/26/2016) for Follow-up on anxiety.   Arnoldo Morale MD

## 2016-07-26 NOTE — Progress Notes (Signed)
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

## 2016-07-26 NOTE — Patient Instructions (Signed)
Generalized Anxiety Disorder Generalized anxiety disorder (GAD) is a mental disorder. It interferes with life functions, including relationships, work, and school. GAD is different from normal anxiety, which everyone experiences at some point in their lives in response to specific life events and activities. Normal anxiety actually helps us prepare for and get through these life events and activities. Normal anxiety goes away after the event or activity is over.  GAD causes anxiety that is not necessarily related to specific events or activities. It also causes excess anxiety in proportion to specific events or activities. The anxiety associated with GAD is also difficult to control. GAD can vary from mild to severe. People with severe GAD can have intense waves of anxiety with physical symptoms (panic attacks).  SYMPTOMS The anxiety and worry associated with GAD are difficult to control. This anxiety and worry are related to many life events and activities and also occur more days than not for 6 months or longer. People with GAD also have three or more of the following symptoms (one or more in children):  Restlessness.   Fatigue.  Difficulty concentrating.   Irritability.  Muscle tension.  Difficulty sleeping or unsatisfying sleep. DIAGNOSIS GAD is diagnosed through an assessment by your health care provider. Your health care provider will ask you questions aboutyour mood,physical symptoms, and events in your life. Your health care provider may ask you about your medical history and use of alcohol or drugs, including prescription medicines. Your health care provider may also do a physical exam and blood tests. Certain medical conditions and the use of certain substances can cause symptoms similar to those associated with GAD. Your health care provider may refer you to a mental health specialist for further evaluation. TREATMENT The following therapies are usually used to treat GAD:    Medication. Antidepressant medication usually is prescribed for long-term daily control. Antianxiety medicines may be added in severe cases, especially when panic attacks occur.   Talk therapy (psychotherapy). Certain types of talk therapy can be helpful in treating GAD by providing support, education, and guidance. A form of talk therapy called cognitive behavioral therapy can teach you healthy ways to think about and react to daily life events and activities.  Stress managementtechniques. These include yoga, meditation, and exercise and can be very helpful when they are practiced regularly. A mental health specialist can help determine which treatment is best for you. Some people see improvement with one therapy. However, other people require a combination of therapies.   This information is not intended to replace advice given to you by your health care provider. Make sure you discuss any questions you have with your health care provider.   Document Released: 02/05/2013 Document Revised: 11/01/2014 Document Reviewed: 02/05/2013 Elsevier Interactive Patient Education 2016 [employer]  

## 2016-07-28 ENCOUNTER — Ambulatory Visit: Payer: No Typology Code available for payment source | Attending: Family Medicine | Admitting: Family Medicine

## 2016-07-28 DIAGNOSIS — Z23 Encounter for immunization: Secondary | ICD-10-CM

## 2016-07-28 LAB — TB SKIN TEST
Induration: 0 mm
TB Skin Test: NEGATIVE

## 2016-07-28 MED ORDER — CYCLOBENZAPRINE HCL 10 MG PO TABS: 10.0000 mg | ORAL_TABLET | Freq: Two times a day (BID) | ORAL | 1 refills | Status: DC | PRN

## 2016-07-28 MED FILL — CYCLOBENZAPRINE 10 MG TAB: 10 | 30 days supply | Qty: 60 | Fill #0

## 2016-07-28 NOTE — Patient Instructions (Signed)
Patient received the DTAP immunization today.

## 2016-07-28 NOTE — Progress Notes (Signed)
Patient here for PPD reading only.

## 2016-07-28 NOTE — Progress Notes (Signed)
Patient received the TDAP injection today.

## 2016-07-29 ENCOUNTER — Encounter: Payer: Self-pay | Admitting: Family Medicine

## 2016-07-30 ENCOUNTER — Ambulatory Visit (INDEPENDENT_AMBULATORY_CARE_PROVIDER_SITE_OTHER): Payer: Self-pay | Admitting: Family Medicine

## 2016-07-30 ENCOUNTER — Encounter: Payer: Self-pay | Admitting: Family Medicine

## 2016-07-30 VITALS — BP 120/77 | Ht 66.0 in | Wt 185.0 lb

## 2016-07-30 DIAGNOSIS — G8929 Other chronic pain: Secondary | ICD-10-CM

## 2016-07-30 DIAGNOSIS — M542 Cervicalgia: Secondary | ICD-10-CM | POA: Insufficient documentation

## 2016-07-30 DIAGNOSIS — M25511 Pain in right shoulder: Secondary | ICD-10-CM

## 2016-07-30 DIAGNOSIS — M1990 Unspecified osteoarthritis, unspecified site: Secondary | ICD-10-CM

## 2016-07-30 DIAGNOSIS — M25512 Pain in left shoulder: Secondary | ICD-10-CM

## 2016-07-30 HISTORY — DX: Cervicalgia: M54.2

## 2016-07-30 MED ORDER — METHYLPREDNISOLONE ACETATE 40 MG/ML IJ SUSP
40.0000 mg | Freq: Once | INTRAMUSCULAR | Status: AC
  Administered 2016-07-30: 40 mg via INTRA_ARTICULAR

## 2016-07-30 NOTE — Assessment & Plan Note (Addendum)
MRI of the cervical spine. Will follow up after this is finished.

## 2016-07-30 NOTE — Progress Notes (Signed)
  Julie Miranda - 55 y.o. female MRN [id number]  Date of birth: [birth year]  SUBJECTIVE:  Including CC & ROS.  CC: Right greater than left shoulder pain Julie Miranda presents with bilateral shoulder pain and states that her right is worse than the left side. She had had her left side injected since it was hurting her more at her last visit. She did well with this and is having less pain in that side. She also complains of having pain radiated down her bilateral arms. She has never had an workup for her neck. She has good neck pain from time to time. She will does report paresthesias down the arms as well. She was given exercises to do for her shoulder last visit but she states that that makes it hurt worse so she has stopped doing them. She would like an injection today. Her range of motion is not improved since her last visit. She does report that she has pain in multiple joints but the shoulders are hurting her the worst.   ROS: No unexpected weight loss, fever, chills, swelling, instability, muscle pain, numbness/tingling, redness, otherwise see HPI   PMHx - Updated and reviewed.  Contributory factors include: Polyarthralgias, hypertension, anemia, hypothyroidism PSHx - Updated and reviewed.  Contributory factors include:  Negative FHx - Updated and reviewed.  Contributory factors include:  Negative Social Hx - Updated and reviewed. Contributory factors include: Negative Medications - reviewed   DATA REVIEWED: Previous office visits, MRI of the left shoulder  PHYSICAL EXAM:  VS: BP:120/77  HR: bpm  TEMP: ( )  RESP:   HT:5\' 6"  (167.6 cm)   WT:185 lb (83.9 kg)  BMI:29.9 PHYSICAL EXAM: Gen: NAD, alert, cooperative with exam, well-appearing HEENT: clear conjunctiva,  CV:  no edema, capillary refill brisk, normal rate Resp: non-labored Skin: no rashes, normal turgor  Neuro: no gross deficits.  Psych:  alert and oriented  Shoulder: Inspection reveals no abnormalities, atrophy or  asymmetry. Palpation is with tenderness over AC joint or bicipital groove. ROM is decreased in all planes, flexion and abduction is to 90, internal rotation is to her sacrum bilaterally. Rotator cuff strength normal throughout. Positive signs of impingement with positive Neer and Hawkin's tests, empty can sign. Does have painful arc, but no drop arm sign.  Neck: Inspection unremarkable. No palpable stepoffs. Negative Spurling's maneuver. Mildly decreased neck range of motion Grip strength decreased 4 out of 5 and sensation normal in bilateral hands Strength unable to determine secondary to pain C4 to T1 distribution No sensory change to C4 to T1 Negative Hoffman sign bilaterally  ASSESSMENT & PLAN:   Bilateral shoulder pain Bilateral but now right is greater than left. Injection performed to the right shoulder. Reviewed MRI results of the left shoulder which include tendinopathy of the rotator cuff.  Recommended continuing her exercises. Due to radicular symptoms we will order an MRI of her C-spine. Did discuss treatment options after MRI is performed if she does have a problem with the cervical spine.  Cervicalgia MRI of the cervical spine. Will follow up after this is finished.  Consent obtained and verified. Sterile betadine prep. Furthur cleansed with alcohol. Topical analgesic spray: Ethyl chloride. Joint: Right shoulder Approached in typical fashion with: Intra-articular Completed without difficulty Meds: 40 mg Depo-Medrol, 4 mL 1% lidocaine without epi Needle: 22-gauge Aftercare instructions and Red flags advised.

## 2016-07-30 NOTE — Assessment & Plan Note (Signed)
Bilateral but now right is greater than left. Injection performed to the right shoulder. Reviewed MRI results of the left shoulder which include tendinopathy of the rotator cuff.  Recommended continuing her exercises. Due to radicular symptoms we will order an MRI of her C-spine. Did discuss treatment options after MRI is performed if she does have a problem with the cervical spine.

## 2016-07-31 NOTE — Progress Notes (Signed)
[facility]: Attending Note: I have reviewed the chart, discussed wit the Sports Medicine Fellow. I agree with assessment and treatment plan as detailed in the Fellow's note.  

## 2016-08-05 ENCOUNTER — Ambulatory Visit
Admission: RE | Admit: 2016-08-05 | Discharge: 2016-08-05 | Disposition: A | Discharge status: Home / Self Care | Payer: No Typology Code available for payment source | Source: Ambulatory Visit | Attending: Family Medicine | Admitting: Family Medicine

## 2016-08-05 DIAGNOSIS — M25511 Pain in right shoulder: Secondary | ICD-10-CM

## 2016-08-05 DIAGNOSIS — M25512 Pain in left shoulder: Principal | ICD-10-CM

## 2016-08-13 ENCOUNTER — Ambulatory Visit: Payer: Self-pay | Admitting: Family Medicine

## 2016-08-17 ENCOUNTER — Encounter: Payer: Self-pay | Admitting: *Deleted

## 2016-08-20 ENCOUNTER — Ambulatory Visit (INDEPENDENT_AMBULATORY_CARE_PROVIDER_SITE_OTHER): Payer: Self-pay | Admitting: Family Medicine

## 2016-08-20 ENCOUNTER — Encounter: Payer: Self-pay | Admitting: Family Medicine

## 2016-08-20 DIAGNOSIS — M25512 Pain in left shoulder: Secondary | ICD-10-CM

## 2016-08-20 DIAGNOSIS — G8929 Other chronic pain: Secondary | ICD-10-CM

## 2016-08-20 DIAGNOSIS — M25511 Pain in right shoulder: Secondary | ICD-10-CM

## 2016-08-20 MED ORDER — METHYLPREDNISOLONE ACETATE 40 MG/ML IJ SUSP
40.0000 mg | Freq: Once | INTRAMUSCULAR | Status: AC
  Administered 2016-08-20: 40 mg via INTRA_ARTICULAR

## 2016-08-24 NOTE — Progress Notes (Signed)
    CHIEF COMPLAINT / HPI:   f/u B shoulder pain. Last ov we injected RIGHT subacromial bursa and she had about 25% improvement in symptoms. We had MRI cervical spine done to rule out radiculopathy symptoms. Her LEFT shoulder bothering her more today.She has been using NTG on left side for about a month now. No problems with it. Feels maybe 10% overall improvement. 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  REVIEW OF SYSTEMS:  Pertinent review of systems: negative for fever or unusual weight change.   OBJECTIVE:  Vital signs are reviewed.  GEN WD WN NAD SHOULDERS: Left: pain with abduction and forward flexion. ROM is full. Strength is intact and symmetrical although she has pain with AROM testing. Right  shoulder FROM in  All planes rotator cuff with intact strength. SKIN no erythema or rash on left or right shoulder area. NEURO Intact sensation to soft touch both hands. VASC: radial pulses 2+B=.  Imaging Reviewed MRI cervical spine  INJECTION: Patient was given informed consent, signed copy in the chart. Appropriate time out was taken. Area prepped and draped in usual sterile fashion. 3 cc of methylprednisolone 40 mg/ml plus  1 cc of 1% lidocaine without epinephrine was injected into the LEFT subacromial bursa using a(n) posterior approach. The patient tolerated the procedure well. There were no complications. Post procedure instructions were given.   ASSESSMENT / PLAN: B shoulder pain, now s/p CSI in each. MRI left shoulder showing significant tendinopathy supraspinatus and small tear in teres minor.Will continue NTG on left shoulder. Continue HEP (aggressively). Reviewed neck MRI--non surgical and I doubt it is contributing much to shoulder issues. F/u 2-3 months. Emphasized importance of HEP.

## 2016-09-03 MED FILL — LOSARTAN POTASSIUM 25 MG TA: 25 | 30 days supply | Qty: 30 | Fill #0

## 2016-09-03 MED FILL — ?LEVOTHYROXINE 125 MCG TABL: 125 | 30 days supply | Qty: 30 | Fill #0

## 2016-09-03 MED FILL — ?HYDROCHLOROTHIAZIDE 12.5 M: 12.5 | 30 days supply | Qty: 30 | Fill #0

## 2016-09-22 ENCOUNTER — Encounter: Payer: Self-pay | Admitting: Internal Medicine

## 2016-11-01 MED FILL — ?HYDROCHLOROTHIAZIDE 12.5 M: 12.5 | 30 days supply | Qty: 30 | Fill #1

## 2016-11-01 MED FILL — busPIRone HCL 7.5 MG TABS: 7.5 | 30 days supply | Qty: 60 | Fill #1

## 2016-11-01 MED FILL — LOSARTAN POTASSIUM 25 MG TA: 25 | 30 days supply | Qty: 30 | Fill #1

## 2016-11-01 MED FILL — ?PANTOPRAZOLE SOD DR 40MG: 40 MG | 30 days supply | Qty: 30 | Fill #0

## 2016-11-01 MED FILL — ?ATORVASTATIN 20 MG TABLET: 20 | 30 days supply | Qty: 30 | Fill #0

## 2016-11-01 MED FILL — ?CITALOPRAM HBR 20 MG TABLE: 20 | 30 days supply | Qty: 30 | Fill #0

## 2016-11-01 MED FILL — TRAVEL SICKNESS 25 MG TAB C: 25 | 10 days supply | Qty: 30 | Fill #1

## 2016-11-01 MED FILL — ?LEVOTHYROXINE 125 MCG TABL: 125 | 30 days supply | Qty: 30 | Fill #1

## 2016-11-01 MED FILL — CYCLOBENZAPRINE 10 MG TAB: 10 | 30 days supply | Qty: 60 | Fill #1

## 2016-11-30 ENCOUNTER — Encounter: Payer: Self-pay | Admitting: Internal Medicine

## 2016-12-07 ENCOUNTER — Other Ambulatory Visit: Payer: Self-pay | Admitting: Family Medicine

## 2016-12-07 MED FILL — ATORVASTATIN 20 MG TABLET: 20 | 30 days supply | Qty: 30 | Fill #1

## 2016-12-07 MED FILL — HYDROCHLOROTHIAZIDE 12.5 MG: 12.5 | 30 days supply | Qty: 30 | Fill #2

## 2016-12-07 MED FILL — TRAVEL SICKNESS 25 MG TAB C: 25 | 10 days supply | Qty: 30 | Fill #2

## 2016-12-07 MED FILL — LEVOTHYROXINE 125 MCG TAB: 125 | 30 days supply | Qty: 30 | Fill #2

## 2016-12-07 MED FILL — ?CITALOPRAM HBR 20 MG TABLE: 20 | 30 days supply | Qty: 30 | Fill #1

## 2016-12-07 MED FILL — busPIRone HCL 7.5 MG TABS: 7.5 | 30 days supply | Qty: 60 | Fill #2

## 2016-12-07 MED FILL — ?PANTOPRAZOLE SOD DR 40MG: 40 MG | 30 days supply | Qty: 30 | Fill #1

## 2016-12-07 MED FILL — LOSARTAN POTASSIUM 25 MG TA: 25 | 30 days supply | Qty: 30 | Fill #2

## 2016-12-08 MED FILL — CYCLOBENZAPRINE 10 MG TAB: 10 | 30 days supply | Qty: 60 | Fill #0

## 2016-12-09 ENCOUNTER — Ambulatory Visit: Payer: Self-pay | Attending: Family Medicine

## 2017-02-01 ENCOUNTER — Encounter: Payer: No Typology Code available for payment source | Admitting: Internal Medicine

## 2017-02-21 ENCOUNTER — Other Ambulatory Visit: Payer: Self-pay | Admitting: Family Medicine

## 2017-03-04 MED FILL — PANTOPRAZOLE SOD DR 40 MG T: 40 | 30 days supply | Qty: 30 | Fill #2

## 2017-03-04 MED FILL — HYDROCHLOROTHIAZIDE 12.5 MG: 12.5 | 30 days supply | Qty: 30 | Fill #3

## 2017-03-04 MED FILL — ?CITALOPRAM HBR 20 MG TABLE: 20 | 30 days supply | Qty: 30 | Fill #2

## 2017-03-04 MED FILL — TRAVEL SICKNESS 25 MG TAB C: 25 | 10 days supply | Qty: 30 | Fill #3

## 2017-03-04 MED FILL — LOSARTAN POTASSIUM 25 MG TA: 25 | 30 days supply | Qty: 30 | Fill #3

## 2017-03-04 MED FILL — LEVOTHYROXINE 125 MCG TAB: 125 | 30 days supply | Qty: 30 | Fill #3

## 2017-03-04 MED FILL — ATORVASTATIN 20 MG TABLET: 20 | 30 days supply | Qty: 30 | Fill #2

## 2017-03-04 MED FILL — busPIRone HCL 7.5 MG TABS: 7.5 | 30 days supply | Qty: 60 | Fill #3

## 2017-04-25 ENCOUNTER — Ambulatory Visit: Payer: Self-pay | Attending: Family Medicine | Admitting: Family Medicine

## 2017-04-25 ENCOUNTER — Encounter: Payer: Self-pay | Admitting: Family Medicine

## 2017-04-25 VITALS — BP 112/76 | HR 103 | Temp 98.5°F | Resp 18 | Ht 65.0 in | Wt 218.6 lb

## 2017-04-25 DIAGNOSIS — K219 Gastro-esophageal reflux disease without esophagitis: Secondary | ICD-10-CM | POA: Insufficient documentation

## 2017-04-25 DIAGNOSIS — E7800 Pure hypercholesterolemia, unspecified: Secondary | ICD-10-CM | POA: Insufficient documentation

## 2017-04-25 DIAGNOSIS — G5603 Carpal tunnel syndrome, bilateral upper limbs: Secondary | ICD-10-CM | POA: Insufficient documentation

## 2017-04-25 DIAGNOSIS — R600 Localized edema: Secondary | ICD-10-CM | POA: Insufficient documentation

## 2017-04-25 DIAGNOSIS — G5600 Carpal tunnel syndrome, unspecified upper limb: Secondary | ICD-10-CM

## 2017-04-25 DIAGNOSIS — K5909 Other constipation: Secondary | ICD-10-CM | POA: Insufficient documentation

## 2017-04-25 DIAGNOSIS — F411 Generalized anxiety disorder: Secondary | ICD-10-CM | POA: Insufficient documentation

## 2017-04-25 DIAGNOSIS — I10 Essential (primary) hypertension: Secondary | ICD-10-CM | POA: Insufficient documentation

## 2017-04-25 DIAGNOSIS — Z8719 Personal history of other diseases of the digestive system: Secondary | ICD-10-CM | POA: Insufficient documentation

## 2017-04-25 DIAGNOSIS — G5601 Carpal tunnel syndrome, right upper limb: Secondary | ICD-10-CM

## 2017-04-25 DIAGNOSIS — E038 Other specified hypothyroidism: Secondary | ICD-10-CM | POA: Insufficient documentation

## 2017-04-25 DIAGNOSIS — M654 Radial styloid tenosynovitis [de Quervain]: Secondary | ICD-10-CM | POA: Insufficient documentation

## 2017-04-25 HISTORY — DX: Carpal tunnel syndrome, unspecified upper limb: G56.00

## 2017-04-25 MED ORDER — LINACLOTIDE 145 MCG PO CAPS
145.0000 ug | ORAL_CAPSULE | Freq: Every day | ORAL | 3 refills | Status: DC
Start: 2017-04-25 — End: 2017-07-18

## 2017-04-25 MED ORDER — ATORVASTATIN CALCIUM 20 MG PO TABS: 20.0000 mg | ORAL_TABLET | Freq: Every day | ORAL | 3 refills | Status: DC

## 2017-04-25 MED ORDER — PROPRANOLOL HCL 20 MG PO TABS: ORAL_TABLET | ORAL | 1 refills | Status: DC

## 2017-04-25 MED ORDER — LOSARTAN POTASSIUM 25 MG PO TABS: 25.0000 mg | ORAL_TABLET | Freq: Every day | ORAL | 3 refills | Status: DC

## 2017-04-25 MED ORDER — PANTOPRAZOLE SODIUM 40 MG PO TBEC: 40.0000 mg | DELAYED_RELEASE_TABLET | Freq: Every day | ORAL | 3 refills | Status: DC

## 2017-04-25 MED ORDER — CITALOPRAM HYDROBROMIDE 20 MG PO TABS: 20.0000 mg | ORAL_TABLET | Freq: Every day | ORAL | 3 refills | Status: DC

## 2017-04-25 MED ORDER — HYDROCHLOROTHIAZIDE 25 MG PO TABS: 25.0000 mg | ORAL_TABLET | Freq: Every day | ORAL | 3 refills | Status: DC

## 2017-04-25 MED ORDER — LEVOTHYROXINE SODIUM 125 MCG PO TABS: ORAL_TABLET | ORAL | 3 refills | Status: DC

## 2017-04-25 MED FILL — LEVOTHYROXINE 125 MCG TAB: 125 | 30 days supply | Qty: 30 | Fill #0

## 2017-04-25 MED FILL — PROPRANOLOL 20 MG TABLET: 20 | 28 days supply | Qty: 8 | Fill #0

## 2017-04-25 MED FILL — ?PANTOPRAZOLE SOD DR 40MG: 40 MG | 30 days supply | Qty: 30 | Fill #0

## 2017-04-25 MED FILL — LOSARTAN POTASSIUM 25 MG TA: 25 | 30 days supply | Qty: 30 | Fill #0

## 2017-04-25 MED FILL — HYDROCHLOROTHIAZIDE 25 MG T: 25 | 30 days supply | Qty: 30 | Fill #0

## 2017-04-25 MED FILL — ?ATORVASTATIN 20 MG TABLET: 20 | 30 days supply | Qty: 30 | Fill #0

## 2017-04-25 MED FILL — CITALOPRAM HBR 20 MG TABLET: 20 | 30 days supply | Qty: 30 | Fill #0

## 2017-04-25 NOTE — Patient Instructions (Signed)

## 2017-04-25 NOTE — Progress Notes (Signed)
Patient says that the pain feels like someone is eating her stomach and it happens every time she eats.  Patient says she is putting a lot of weight on because of her thyroid.

## 2017-04-25 NOTE — Progress Notes (Signed)
Subjective:  Patient ID: Julie Miranda, female    DOB: [birth year]  Age: 56 y.o. MRN: [id number]  CC: Thyroid Problem and Medication Refill   HPI Julie Miranda is a 56 year old female with a history of GERD, hypothyroidism, hypertension, anxiety, multiple arthralgias, de Quervain's tenosynovitis, carpal tunnel syndrome who comes into the clinic for follow-up visit.  She complains of constipation which is not relieved by dietary modification, MiraLAX, lactulose. She also has nausea whenever she eats and complains of abdominal pain which feels like "someone is eating her stomach". Upper endoscopy from 10/2013 was negative for peptic ulcers, revealed healthy appearing and patent Roux-en -Y anastomosis in the gastric body and colonoscopy revealed three polyps which were resected, the pathology was negative for malignancy. She has been out of her Protonix.  She also complains of numbness in the tip of her right hand and symptoms are mildly relieved when she uses her wrist brace. 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 Her left hand is beginning to have the symptoms however it is mild compared to the right; she is right handed.  She complains of anxiety despite taking her BuSpar and Celexa and this is worse when she has to take tests. Anxiety affects her test scores and she is concerned. She also complains of pedal edema and states she does not feel the effect of the hydrochlorothiazide.  She has been out of her medications some time now.  Past Medical History:  Diagnosis Date  . Gastric ulcer   . GERD (gastroesophageal reflux disease)   . Hypertension   . Thyroid disease       Allergies  Allergen Reactions  . Morphine And Related Nausea And Vomiting      Outpatient Medications Prior to Visit  Medication Sig Dispense Refill  . APAP (TYLENOL) 325 MG tablet Take 2 tablets (650  mg total) by mouth every 12 (twelve) hours as needed for headache. 30 tablet 1  . busPIRone (BUSPAR) 7.5 MG tablet Take 1 tablet (7.5 mg total) by mouth 2 (two) times daily. 60 tablet 3  . clonazePAM (KLONOPIN) 0.5 MG tablet   1  . cyclobenzaprine (FLEXERIL) 10 MG tablet TAKE 1 TABLET BY MOUTH 2 TIMES DAILY AS NEEDED FOR MUSCLE SPASMS. 60 tablet 0  . lactulose (CHRONULAC) 10 GM/15ML solution Take 15 mLs (10 g total) by mouth 2 (two) times daily as needed for mild constipation. 946 mL 2  . meclizine (ANTIVERT) 25 MG tablet Take 1 tablet (25 mg total) by mouth 3 (three) times daily as needed. 30 tablet 3  . Menthol, Topical Analgesic, (ICY HOT EX) Apply 1 application topically 2 (two) times daily as needed. por pain/spasms    . ondansetron (ZOFRAN) 4 MG tablet Take 1 tablet (4 mg total) by mouth every 8 (eight) hours as needed for nausea. 20 tablet 0  . traMADol (ULTRAM) 50 MG tablet Take 1 tablet (50 mg total) by mouth every 8 (eight) hours as needed. 60 tablet 0  . TRAVEL SICKNESS 25 MG CHEW   1  . AMITIZA 24 MCG capsule   2  . atorvastatin (LIPITOR) 20 MG tablet Take 1 tablet (20 mg total) by mouth daily. 30 tablet 3  . citalopram (CELEXA) 20 MG tablet Take 1 tablet (20 mg total) by mouth daily. 30 tablet 3  . levothyroxine (SYNTHROID, LEVOTHROID) 125 MCG tablet TAKE 1  TABLET BY MOUTH DAILY BEFORE BREAKFAST. 30 tablet 3  . losartan (COZAAR) 25 MG tablet Take 1 tablet (25 mg total) by mouth daily. 30 tablet 3  . pantoprazole (PROTONIX) 40 MG tablet Take 1 tablet (40 mg total) by mouth daily. 30 tablet 3  . nitroGLYCERIN (NITRODUR - DOSED IN MG/24 HR) 0.2 mg/hr patch Use 1/4 patch daily to the affected area (Patient not taking: Reported on 04/25/2017) 30 patch 1  . docusate sodium 100 MG CAPS Take 100 mg by mouth 2 (two) times daily. (Patient not taking: Reported on 04/25/2017) 10 capsule 0  . hydrochlorothiazide (HYDRODIURIL) 12.5 MG tablet Take 1 tablet (12.5 mg total) by mouth daily. (Patient not  taking: Reported on 04/25/2017) 30 tablet 3   No facility-administered medications prior to visit.     ROS Review of Systems Constitutional: Negative for activity change, appetite change and fatigue.  HENT: Negative for congestion, sinus pressure and sore throat.   Eyes: Negative for visual disturbance.  Respiratory: Negative for cough, chest tightness, shortness of breath and wheezing.   Cardiovascular: Negative for chest pain and palpitations.  positive for leg swelling Gastrointestinal: Positive for constipation and nausea. Negative for abdominal distention and abdominal pain.  Endocrine: Negative for polydipsia.  Genitourinary: Negative for dysuria and frequency.  Musculoskeletal: Negative for arthralgias and back pain.  Skin: Negative for rash.  Neurological: Negative for tremors, light-headedness and numbness.  Hematological: Does not bruise/bleed easily.  Psychiatric/Behavioral: Negative for agitation and behavioral problems. The patient is nervous/anxious.  Objective:  BP 112/76 (BP Location: Right Arm, Patient Position: Sitting, Cuff Size: Large)   Pulse (!) 103   Temp 98.5 F (36.9 C) (Oral)   Resp 18   Ht 5\' 5"  (1.651 m)   Wt 218 lb 9.6 oz (99.2 kg)   SpO2 100%   BMI 36.38 kg/m   BP/Weight 04/25/2017 08/20/2016 93/05/1016  Systolic BP 510 258 527  Diastolic BP 76 83 77  Wt. (Lbs) 218.6 185 185  BMI 36.38 29.86 29.86      Physical Exam  Constitutional: She is oriented to person, place, and time. She appears well-developed and well-nourished.  Cardiovascular: Normal heart sounds and intact distal pulses.  Tachycardia present.   No murmur heard. Pulmonary/Chest: Effort normal and breath sounds normal. She has no wheezes. She has no rales. She exhibits no tenderness.  Abdominal: Soft. Bowel sounds are normal. She exhibits no distension and no mass. There is no tenderness.  Musculoskeletal: She exhibits edema ( 1+ bilateral nonpitting pedal edema).  Normal appearance  of both wrists Positive Phalen's and Tinel sign  Neurological: She is alert and oriented to person, place, and time.  Skin: Skin is warm and dry.  Psychiatric: She has a normal mood and affect.    CMP Latest Ref Rng & Units 03/01/2016 08/29/2015 07/23/2015  Glucose 65 - 99 mg/dL 84 97 65  BUN 7 - 25 mg/dL 10 14 13   Creatinine 0.50 - 1.05 mg/dL 1.00 0.87 0.88  Sodium 135 - 146 mmol/L 139 140 142  Potassium 3.5 - 5.3 mmol/L 4.0 3.5 3.5  Chloride 98 - 110 mmol/L 101 100 104  CO2 20 - 31 mmol/L 30 32(H) 29  Calcium 8.6 - 10.4 mg/dL 9.9 9.1 9.9  Total Protein 6.1 - 8.1 g/dL 6.7 - -  Total Bilirubin 0.2 - 1.2 mg/dL 0.5 - -  Alkaline Phos 33 - 130 U/L 105 - -  AST 10 - 35 U/L 31 - -  ALT 6 - 29  U/L 42(H) - -    Lipid Panel     Component Value Date/Time   CHOL 290 (H) 03/01/2016 1053   TRIG 130 03/01/2016 1053   HDL 105 03/01/2016 1053   CHOLHDL 2.8 03/01/2016 1053   VLDL 26 03/01/2016 1053   LDLCALC 159 (H) 03/01/2016 1053      Assessment & Plan:   1. Pure hypercholesterolemia Uncontrolled Low cholesterol diet We will send off a lipid panel and adjust dose accordingly - atorvastatin (LIPITOR) 20 MG tablet; Take 1 tablet (20 mg total) by mouth daily.  Dispense: 30 tablet; Refill: 3  2. Essential hypertension, benign Controlled Increased dose of hydrochlorothiazide due to complain of pedal edema. - losartan (COZAAR) 25 MG tablet; Take 1 tablet (25 mg total) by mouth daily.  Dispense: 30 tablet; Refill: 3 - citalopram (CELEXA) 20 MG tablet; Take 1 tablet (20 mg total) by mouth daily.  Dispense: 30 tablet; Refill: 3 - hydrochlorothiazide (HYDRODIURIL) 25 MG tablet; Take 1 tablet (25 mg total) by mouth daily.  Dispense: 30 tablet; Refill: 3 - Comprehensive metabolic panel; Future - Lipid panel; Future  3. Anxiety state Continue BuSpar I have added propranolol for performance anxiety or prior to test Discussed antihypertensive property of propranolol and the fact that she has a  soft blood pressure-will need to exercise caution  4. Gastroesophageal reflux disease without esophagitis Given nausea and abdominal symptoms I will need to check for H.Pylori - pantoprazole (PROTONIX) 40 MG tablet; Take 1 tablet (40 mg total) by mouth daily.  Dispense: 30 tablet; Refill: 3 - H. pylori breath test  5. Other specified hypothyroidism - levothyroxine (SYNTHROID, LEVOTHROID) 125 MCG tablet; TAKE 1 TABLET BY MOUTH DAILY BEFORE BREAKFAST.  Dispense: 30 tablet; Refill: 3 - TSH; Future  6. Other constipation Increase fiber intake - linaclotide (LINZESS) 145 MCG CAPS capsule; Take 1 capsule (145 mcg total) by mouth daily before breakfast.  Dispense: 30 capsule; Refill: 3  7. De Quervain's tenosynovitis, bilateral and carpal tunnel syndrome Use wrist brace Surgery on hold pending completion of patient schooling   Meds ordered this encounter  Medications  . atorvastatin (LIPITOR) 20 MG tablet    Sig: Take 1 tablet (20 mg total) by mouth daily.    Dispense:  30 tablet    Refill:  3  . losartan (COZAAR) 25 MG tablet    Sig: Take 1 tablet (25 mg total) by mouth daily.    Dispense:  30 tablet    Refill:  3  . citalopram (CELEXA) 20 MG tablet    Sig: Take 1 tablet (20 mg total) by mouth daily.    Dispense:  30 tablet    Refill:  3  . pantoprazole (PROTONIX) 40 MG tablet    Sig: Take 1 tablet (40 mg total) by mouth daily.    Dispense:  30 tablet    Refill:  3  . levothyroxine (SYNTHROID, LEVOTHROID) 125 MCG tablet    Sig: TAKE 1 TABLET BY MOUTH DAILY BEFORE BREAKFAST.    Dispense:  30 tablet    Refill:  3  . hydrochlorothiazide (HYDRODIURIL) 25 MG tablet    Sig: Take 1 tablet (25 mg total) by mouth daily.    Dispense:  30 tablet    Refill:  3    Discontinue previous dose  . propranolol (INDERAL) 20 MG tablet    Sig: Take 1 tablet (20 mg)  2 times a week and /or as needed for social or performance anxiety    Dispense:  30 tablet    Refill:  1  . linaclotide (LINZESS)  145 MCG CAPS capsule    Sig: Take 1 capsule (145 mcg total) by mouth daily before breakfast.    Dispense:  30 capsule    Refill:  3    Follow-up: Return in about 3 months (around 07/26/2017) for Follow-up on chronic medical conditions.   Arnoldo Morale MD

## 2017-04-26 ENCOUNTER — Ambulatory Visit: Payer: Self-pay | Attending: Family Medicine

## 2017-04-26 DIAGNOSIS — E038 Other specified hypothyroidism: Secondary | ICD-10-CM | POA: Insufficient documentation

## 2017-04-26 DIAGNOSIS — I10 Essential (primary) hypertension: Secondary | ICD-10-CM | POA: Insufficient documentation

## 2017-04-26 NOTE — Progress Notes (Signed)
Patient here for lab visit  

## 2017-04-27 LAB — COMPREHENSIVE METABOLIC PANEL WITH GFR
ALT: 17 IU/L (ref 0–32)
AST: 24 IU/L (ref 0–40)
Albumin/Globulin Ratio: 1.6 (ref 1.2–2.2)
Albumin: 4.1 g/dL (ref 3.5–5.5)
Alkaline Phosphatase: 84 IU/L (ref 39–117)
BUN/Creatinine Ratio: 6 — ABNORMAL LOW (ref 9–23)
BUN: 6 mg/dL (ref 6–24)
Bilirubin Total: 0.3 mg/dL (ref 0.0–1.2)
CO2: 26 mmol/L (ref 20–29)
Calcium: 9.6 mg/dL (ref 8.7–10.2)
Chloride: 106 mmol/L (ref 96–106)
Creatinine, Ser: 1.05 mg/dL — ABNORMAL HIGH (ref 0.57–1.00)
GFR calc Af Amer: 69 mL/min/1.73
GFR calc non Af Amer: 60 mL/min/1.73
Globulin, Total: 2.5 g/dL (ref 1.5–4.5)
Glucose: 77 mg/dL (ref 65–99)
Potassium: 4.6 mmol/L (ref 3.5–5.2)
Sodium: 146 mmol/L — ABNORMAL HIGH (ref 134–144)
Total Protein: 6.6 g/dL (ref 6.0–8.5)

## 2017-04-27 LAB — LIPID PANEL
CHOLESTEROL TOTAL: 219 mg/dL — AB (ref 100–199)
Chol/HDL Ratio: 2.5 ratio (ref 0.0–4.4)
HDL: 89 mg/dL (ref >39)
LDL Calculated: 110 mg/dL — ABNORMAL HIGH (ref 0–99)
Triglycerides: 98 mg/dL (ref 0–149)
VLDL Cholesterol Cal: 20 mg/dL (ref 5–40)

## 2017-04-27 LAB — TSH: TSH: 13.84 u[IU]/mL — ABNORMAL HIGH (ref 0.450–4.500)

## 2017-04-27 LAB — H. PYLORI BREATH TEST: H pylori Breath Test: NEGATIVE

## 2017-05-09 ENCOUNTER — Ambulatory Visit: Payer: Self-pay

## 2017-05-23 ENCOUNTER — Ambulatory Visit: Payer: Self-pay | Admitting: Family Medicine

## 2017-05-23 ENCOUNTER — Telehealth: Payer: Self-pay | Admitting: Family Medicine

## 2017-05-23 NOTE — Telephone Encounter (Signed)
Pt request referral for pain in abdomen, question of infection, discharge and foul odor. Aware can make appt with PCP.

## 2017-05-23 NOTE — Telephone Encounter (Signed)
Pt called requesting OBGYN referral if possible, please f/up

## 2017-05-24 ENCOUNTER — Ambulatory Visit: Payer: Self-pay | Attending: Family Medicine | Admitting: Family Medicine

## 2017-05-24 ENCOUNTER — Encounter: Payer: Self-pay | Admitting: Family Medicine

## 2017-05-24 VITALS — BP 120/83 | HR 83 | Temp 98.3°F | Resp 18 | Ht 65.0 in | Wt 212.6 lb

## 2017-05-24 DIAGNOSIS — G5600 Carpal tunnel syndrome, unspecified upper limb: Secondary | ICD-10-CM | POA: Insufficient documentation

## 2017-05-24 DIAGNOSIS — I10 Essential (primary) hypertension: Secondary | ICD-10-CM | POA: Insufficient documentation

## 2017-05-24 DIAGNOSIS — Z79899 Other long term (current) drug therapy: Secondary | ICD-10-CM | POA: Insufficient documentation

## 2017-05-24 DIAGNOSIS — Z8711 Personal history of peptic ulcer disease: Secondary | ICD-10-CM | POA: Insufficient documentation

## 2017-05-24 DIAGNOSIS — E039 Hypothyroidism, unspecified: Secondary | ICD-10-CM | POA: Insufficient documentation

## 2017-05-24 DIAGNOSIS — Z9884 Bariatric surgery status: Secondary | ICD-10-CM | POA: Insufficient documentation

## 2017-05-24 DIAGNOSIS — F411 Generalized anxiety disorder: Secondary | ICD-10-CM

## 2017-05-24 DIAGNOSIS — K219 Gastro-esophageal reflux disease without esophagitis: Secondary | ICD-10-CM | POA: Insufficient documentation

## 2017-05-24 DIAGNOSIS — Z634 Disappearance and death of family member: Secondary | ICD-10-CM

## 2017-05-24 DIAGNOSIS — Z885 Allergy status to narcotic agent status: Secondary | ICD-10-CM | POA: Insufficient documentation

## 2017-05-24 DIAGNOSIS — N898 Other specified noninflammatory disorders of vagina: Secondary | ICD-10-CM

## 2017-05-24 MED ORDER — CYCLOBENZAPRINE HCL 10 MG PO TABS: ORAL_TABLET | ORAL | 3 refills | Status: DC

## 2017-05-24 MED ORDER — BUSPIRONE HCL 7.5 MG PO TABS: 7.5000 mg | ORAL_TABLET | Freq: Two times a day (BID) | ORAL | 3 refills | Status: DC

## 2017-05-24 MED FILL — ?CYCLOBENZAPRINE 10 MG TABL: 10 | 30 days supply | Qty: 60 | Fill #0

## 2017-05-24 MED FILL — busPIRone HCL 7.5 MG TABS: 7.5 | 30 days supply | Qty: 60 | Fill #0

## 2017-05-24 NOTE — Progress Notes (Signed)
Patient has not eaten  Patient has not taken medication  Needs refill for flexeril and buspar

## 2017-05-24 NOTE — Progress Notes (Signed)
Subjective:  Patient ID: Julie Miranda, female    DOB: Feb 19, 1961  Age: 56 y.o. MRN: [id number]  CC: Vaginal Discharge   HPI Julie Miranda is a 56 year old female with a history of GERD, hypothyroidism, hypertension, anxiety, multiple arthralgias, de Quervain's tenosynovitis, carpal tunnel syndrome who comes into the clinic with complaints of recurrent vaginal discharge.  She has noticed a foul smell and a vaginal discharge which occurs every 1-2 months and she has noticed this is also brought about by her being upset. She recently lost her younger sister and the funeral was last thursday; she is unsure if that triggered this recent occurrence.  Anxiety has heightened and she is requesting a refill of her BuSpar. She is also requesting a refill of Flexeril.  Past Medical History:  Diagnosis Date  . Gastric ulcer   . GERD (gastroesophageal reflux disease)   . Hypertension   . Thyroid disease     Past Surgical History:  Procedure Laterality Date  . GASTRIC BYPASS      Allergies  Allergen Reactions  . Morphine And Related Nausea And Vomiting     Outpatient Medications Prior to Visit  Medication Sig Dispense Refill  . APAP (TYLENOL) 325 MG tablet Take 2 tablets (650 mg total) by mouth every 12 (twelve) hours as needed for headache. 30 tablet 1  . atorvastatin (LIPITOR) 20 MG tablet Take 1 tablet (20 mg total) by mouth daily. 30 tablet 3  . citalopram (CELEXA) 20 MG tablet Take 1 tablet (20 mg total) by mouth daily. 30 tablet 3  . clonazePAM (KLONOPIN) 0.5 MG tablet   1  . hydrochlorothiazide (HYDRODIURIL) 25 MG tablet Take 1 tablet (25 mg total) by mouth daily. 30 tablet 3  . lactulose (CHRONULAC) 10 GM/15ML solution Take 15 mLs (10 g total) by mouth 2 (two) times daily as needed for mild constipation. 946 mL 2  . levothyroxine (SYNTHROID, LEVOTHROID) 125 MCG tablet TAKE 1 TABLET BY MOUTH DAILY BEFORE BREAKFAST. 30 tablet 3  . linaclotide (LINZESS) 145 MCG CAPS capsule Take  1 capsule (145 mcg total) by mouth daily before breakfast. 30 capsule 3  . losartan (COZAAR) 25 MG tablet Take 1 tablet (25 mg total) by mouth daily. 30 tablet 3  . meclizine (ANTIVERT) 25 MG tablet Take 1 tablet (25 mg total) by mouth 3 (three) times daily as needed. 30 tablet 3  . Menthol, Topical Analgesic, (ICY HOT EX) Apply 1 application topically 2 (two) times daily as needed. por pain/spasms    . nitroGLYCERIN (NITRODUR - DOSED IN MG/24 HR) 0.2 mg/hr patch Use 1/4 patch daily to the affected area 30 patch 1  . ondansetron (ZOFRAN) 4 MG tablet Take 1 tablet (4 mg total) by mouth every 8 (eight) hours as needed for nausea. 20 tablet 0  . pantoprazole (PROTONIX) 40 MG tablet Take 1 tablet (40 mg total) by mouth daily. 30 tablet 3  . propranolol (INDERAL) 20 MG tablet Take 1 tablet (20 mg)  2 times a week and /or as needed for social or performance anxiety 30 tablet 1  . traMADol (ULTRAM) 50 MG tablet Take 1 tablet (50 mg total) by mouth every 8 (eight) hours as needed. 60 tablet 0  . TRAVEL SICKNESS 25 MG CHEW   1  . busPIRone (BUSPAR) 7.5 MG tablet Take 1 tablet (7.5 mg total) by mouth 2 (two) times daily. 60 tablet 3  . cyclobenzaprine (FLEXERIL) 10 MG tablet TAKE 1 TABLET BY MOUTH 2 TIMES DAILY AS NEEDED  FOR MUSCLE SPASMS. 60 tablet 0   No facility-administered medications prior to visit.     ROS Review of Systems  Constitutional: Negative for activity change, appetite change and fatigue.  HENT: Negative for congestion, sinus pressure and sore throat.   Eyes: Negative for visual disturbance.  Respiratory: Negative for cough, chest tightness, shortness of breath and wheezing.   Cardiovascular: Negative for chest pain and palpitations.  Gastrointestinal: Negative for abdominal distention, abdominal pain and constipation.  Endocrine: Negative for polydipsia.  Genitourinary: Positive for vaginal discharge. Negative for dysuria and frequency.  Musculoskeletal: Negative.  Negative for  arthralgias and back pain.  Skin: Negative for rash.  Neurological: Negative for tremors, light-headedness and numbness.  Hematological: Does not bruise/bleed easily.  Psychiatric/Behavioral: Negative for agitation and behavioral problems.       Positive for anxiety    Objective:  BP 120/83 (BP Location: Left Arm, Patient Position: Sitting, Cuff Size: Large)   Pulse 83   Temp 98.3 F (36.8 C) (Oral)   Resp 18   Ht 5\' 5"  (1.651 m)   Wt 212 lb 9.6 oz (96.4 kg)   SpO2 97%   BMI 35.38 kg/m   BP/Weight 05/24/2017 04/25/2017 25/00/3704  Systolic BP 888 916 945  Diastolic BP 83 76 83  Wt. (Lbs) 212.6 218.6 185  BMI 35.38 36.38 29.86      Physical Exam  Constitutional: She is oriented to person, place, and time. She appears well-developed and well-nourished.  Cardiovascular: Normal rate, normal heart sounds and intact distal pulses.   No murmur heard. Pulmonary/Chest: Effort normal and breath sounds normal. She has no wheezes. She has no rales. She exhibits no tenderness.  Abdominal: Soft. Bowel sounds are normal. She exhibits no distension and no mass. There is no tenderness.  Genitourinary:  Genitourinary Comments: External genitalia, vagina-normal  Musculoskeletal: Normal range of motion.  Neurological: She is alert and oriented to person, place, and time.     Assessment & Plan:   1. Generalized anxiety disorder Uncontrolled Worsened by recent bereavement - busPIRone (BUSPAR) 7.5 MG tablet; Take 1 tablet (7.5 mg total) by mouth 2 (two) times daily.  Dispense: 60 tablet; Refill: 3  2. Vaginal discharge Could be explained by her postmenopausal status which places her at high risk of recurrent infections. We'll prescribe medication once results are obtained and also write prescription for refills - Urine cytology ancillary only  3. Bereavement Advised on the need for grief counseling   Meds ordered this encounter  Medications  . cyclobenzaprine (FLEXERIL) 10 MG tablet     Sig: TAKE 1 TABLET BY MOUTH 2 TIMES DAILY AS NEEDED FOR MUSCLE SPASMS.    Dispense:  60 tablet    Refill:  3    Must have office visit for refills  . busPIRone (BUSPAR) 7.5 MG tablet    Sig: Take 1 tablet (7.5 mg total) by mouth 2 (two) times daily.    Dispense:  60 tablet    Refill:  3    Follow-up: Return if symptoms worsen or fail to improve, for Follow-up of chronic medical conditions, keep previously scheduled appointment.   This note has been created with Surveyor, quantity. Any transcriptional errors are unintentional.     Arnoldo Morale MD

## 2017-05-24 NOTE — Patient Instructions (Signed)

## 2017-05-25 LAB — URINE CYTOLOGY ANCILLARY ONLY
Bacterial vaginitis: NEGATIVE
Candida vaginitis: NEGATIVE
Chlamydia: NEGATIVE
Neisseria Gonorrhea: NEGATIVE
Trichomonas: NEGATIVE

## 2017-06-16 ENCOUNTER — Ambulatory Visit (AMBULATORY_SURGERY_CENTER): Payer: Self-pay | Admitting: *Deleted

## 2017-06-16 VITALS — Ht 65.0 in | Wt 217.6 lb

## 2017-06-16 DIAGNOSIS — Z86010 Personal history of colonic polyps: Secondary | ICD-10-CM

## 2017-06-16 MED ORDER — NA SULFATE-K SULFATE-MG SULF 17.5-3.13-1.6 GM/177ML PO SOLN: 1.0000 [IU] | Freq: Once | ORAL | 0 refills | Status: AC

## 2017-06-16 NOTE — Progress Notes (Signed)
No egg or soy allergy known to patient  No issues with past sedation with any surgeries  or procedures, no intubation problems  No diet pills per patient No home 02 use per patient  No blood thinners per patient  Pt has issues with constipation  Will have 2 day prep  No A fib or A flutter  EMMI video sent to pt's e mail   Medication Samples have been provided to the patient.  Drug name: Hinda Kehr: 1 LOT: [id number]  Exp.Date: 6/20   The patient has been instructed regarding the correct time, dose, and frequency of taking this medication, including desired effects and most common side effects.   Foy Guadalajara Benitez 11:35 AM 06/16/2017

## 2017-06-28 ENCOUNTER — Telehealth: Payer: Self-pay | Admitting: Family Medicine

## 2017-06-28 NOTE — Telephone Encounter (Signed)
Pt called to request lab and Pap result, please call her back, she still waiting for someone to call her

## 2017-06-28 NOTE — Telephone Encounter (Signed)
Pt was called and informed of lab and pap results.

## 2017-06-30 ENCOUNTER — Encounter: Payer: Self-pay | Admitting: Internal Medicine

## 2017-07-06 ENCOUNTER — Encounter: Payer: Self-pay | Admitting: Internal Medicine

## 2017-07-06 ENCOUNTER — Ambulatory Visit (AMBULATORY_SURGERY_CENTER): Payer: Self-pay | Admitting: Internal Medicine

## 2017-07-06 VITALS — BP 150/99 | HR 69 | Temp 97.5°F | Resp 18 | Ht 65.0 in | Wt 212.0 lb

## 2017-07-06 DIAGNOSIS — K635 Polyp of colon: Secondary | ICD-10-CM

## 2017-07-06 DIAGNOSIS — D125 Benign neoplasm of sigmoid colon: Secondary | ICD-10-CM

## 2017-07-06 DIAGNOSIS — Z86010 Personal history of colonic polyps: Secondary | ICD-10-CM

## 2017-07-06 MED ORDER — SODIUM CHLORIDE 0.9 % IV SOLN: 500.0000 mL | INTRAVENOUS | Status: DC

## 2017-07-06 NOTE — Progress Notes (Signed)
Called to room to assist during endoscopic procedure.  Patient ID and intended procedure confirmed with present staff. Received instructions for my participation in the procedure from the performing physician.  

## 2017-07-06 NOTE — Patient Instructions (Signed)
   INFORMATION ON POLYPS,DIVERTICULOSI,AND HEMORRHOIDS GIVEN TO YOU TODAY  AWAIT PATHOLOGY RESULTS   YOU HAD AN ENDOSCOPIC PROCEDURE TODAY AT [facility]:   Refer to the procedure report that was given to you for any specific questions about what was found during the examination.  If the procedure report does not answer your questions, please call your gastroenterologist to clarify.  If you requested that your care partner not be given the details of your procedure findings, then the procedure report has been included in a sealed envelope for you to review at your convenience later.  YOU SHOULD EXPECT: Some feelings of bloating in the abdomen. Passage of more gas than usual.  Walking can help get rid of the air that was put into your GI tract during the procedure and reduce the bloating. If you had a lower endoscopy (such as a colonoscopy or flexible sigmoidoscopy) you may notice spotting of blood in your stool or on the toilet paper. If you underwent a bowel prep for your procedure, you may not have a normal bowel movement for a few days.  Please Note:  You might notice some irritation and congestion in your nose or some drainage.  This is from the oxygen used during your procedure.  There is no need for concern and it should clear up in a day or so.  SYMPTOMS TO REPORT IMMEDIATELY:   Following lower endoscopy (colonoscopy or flexible sigmoidoscopy):  Excessive amounts of blood in the stool  Significant tenderness or worsening of abdominal pains  Swelling of the abdomen that is new, acute  Fever of 100F or higher    For urgent or emergent issues, a gastroenterologist can be reached at any hour by calling [phone].   DIET:  We do recommend a small meal at first, but then you may proceed to your regular diet.  Drink plenty of fluids but you should avoid alcoholic beverages for 24 hours.  ACTIVITY:  You should plan to take it easy for the rest of today and you should  NOT DRIVE or use heavy machinery until tomorrow (because of the sedation medicines used during the test).    FOLLOW UP: Our staff will call the number listed on your records the next business day following your procedure to check on you and address any questions or concerns that you may have regarding the information given to you following your procedure. If we do not reach you, we will leave a message.  However, if you are feeling well and you are not experiencing any problems, there is no need to return our call.  We will assume that you have returned to your regular daily activities without incident.  If any biopsies were taken you will be contacted by phone or by letter within the next 1-3 weeks.  Please call us at [phone] if you have not heard about the biopsies in 3 weeks.    SIGNATURES/CONFIDENTIALITY: You and/or your care partner have signed paperwork which will be entered into your electronic medical record.  These signatures attest to the fact that that the information above on your After Visit Summary has been reviewed and is understood.  Full responsibility of the confidentiality of this discharge information lies with you and/or your care-partner.

## 2017-07-06 NOTE — Op Note (Signed)
[facility] Patient Name: Julie Miranda Procedure Date: 07/06/2017 10:31 AM MRN: [id number] Endoscopist: Jerene Bears , MD Age: 56 Referring MD:  Date of Birth: 1961-08-01 Gender: Female Account #: [id number] Procedure:                Colonoscopy Indications:              High risk colon cancer surveillance: Personal                            history of adenoma with villous component, Last                            colonoscopy 3 years ago Medicines:                Monitored Anesthesia Care Procedure:                Pre-Anesthesia Assessment:                           - Prior to the procedure, a History and Physical                            was performed, and patient medications and                            allergies were reviewed. The patient's tolerance of                            previous anesthesia was also reviewed. The risks                            and benefits of the procedure and the sedation                            options and risks were discussed with the patient.                            All questions were answered, and informed consent                            was obtained. Prior Anticoagulants: The patient has                            taken no previous anticoagulant or antiplatelet                            agents. ASA Grade Assessment: II - A patient with                            mild systemic disease. After reviewing the risks                            and benefits, the patient was deemed in  satisfactory condition to undergo the procedure.                           After obtaining informed consent, the colonoscope                            was passed under direct vision. Throughout the                            procedure, the patient's blood pressure, pulse, and                            oxygen saturations were monitored continuously. The                            Colonoscope was introduced through the  anus and                            advanced to the the cecum, identified by                            appendiceal orifice and ileocecal valve. The                            colonoscopy was performed without difficulty. The                            patient tolerated the procedure well. The quality                            of the bowel preparation was good. The ileocecal                            valve, appendiceal orifice, and rectum were                            photographed. The bowel preparation used was 2 day                            MiraLax and Suprep. Scope In: 10:47:47 AM Scope Out: 11:03:41 AM Scope Withdrawal Time: 0 hours 12 minutes 8 seconds  Total Procedure Duration: 0 hours 15 minutes 54 seconds  Findings:                 The perianal and digital rectal examinations were                            normal.                           A 5 mm polyp was found in the sigmoid colon. The                            polyp was sessile. The polyp was removed with a  cold snare. Resection and retrieval were complete.                           A few small-mouthed diverticula were found in the                            sigmoid colon and ascending colon.                           Internal hemorrhoids were found during                            retroflexion. The hemorrhoids were small. Complications:            No immediate complications. Estimated Blood Loss:     Estimated blood loss was minimal. Impression:               - One 5 mm polyp in the sigmoid colon, removed with                            a cold snare. Resected and retrieved.                           - Diverticulosis in the sigmoid colon and in the                            ascending colon.                           - Internal hemorrhoids. Recommendation:           - Patient has a contact number available for                            emergencies. The signs and symptoms of potential                             delayed complications were discussed with the                            patient. Return to normal activities tomorrow.                            Written discharge instructions were provided to the                            patient.                           - Resume previous diet.                           - Continue present medications.                           - Await pathology results.                           -  Repeat colonoscopy is recommended for                            surveillance. The colonoscopy date will be                            determined after pathology results from today's                            exam become available for review. Jerene Bears, MD 07/06/2017 11:09:47 AM This report has been signed electronically.

## 2017-07-06 NOTE — Progress Notes (Signed)
Report to PACU, RN, vss, BBS= Clear.  

## 2017-07-07 ENCOUNTER — Telehealth: Payer: Self-pay | Admitting: *Deleted

## 2017-07-07 NOTE — Telephone Encounter (Signed)
  Follow up Call-  Call back number 07/06/2017  Post procedure Call Back phone  # [phone]  Permission to leave phone message Yes  Some recent data might be hidden     Patient questions:  Do you have a fever, pain , or abdominal swelling? No. Pain Score  0 *  Have you tolerated food without any problems? Yes.    Have you been able to return to your normal activities? Yes.    Do you have any questions about your discharge instructions: Diet   No. Medications  Yes.   Follow up visit  No.  Do you have questions or concerns about your Care? Yes.    Actions: * If pain score is 4 or above: No action needed, pain <4.

## 2017-07-12 MED FILL — !LINZESS 145 MCG CAPSULE: 145 | 30 days supply | Qty: 30 | Fill #0

## 2017-07-14 ENCOUNTER — Encounter: Payer: Self-pay | Admitting: Internal Medicine

## 2017-07-18 ENCOUNTER — Other Ambulatory Visit: Payer: Self-pay

## 2017-07-18 DIAGNOSIS — K5909 Other constipation: Secondary | ICD-10-CM

## 2017-07-18 MED ORDER — LINACLOTIDE 145 MCG PO CAPS: 145.0000 ug | ORAL_CAPSULE | Freq: Every day | ORAL | 3 refills | Status: DC

## 2017-07-20 ENCOUNTER — Telehealth: Payer: Self-pay | Admitting: Internal Medicine

## 2017-07-20 NOTE — Telephone Encounter (Signed)
Pt states she has been taking protonix BID but is still having issues. Reports she is having indigestion if she eats or if she doesn't eat. She has anxiety and states she has a chest burning sensation. Pt was instructed to take protonix BID and to call back if no improvement. Please advise.

## 2017-07-20 NOTE — Telephone Encounter (Signed)
Would change PPI to Dexilant 60 mg daily Would recommend EGD to further eval symptoms despite PPI

## 2017-07-21 ENCOUNTER — Other Ambulatory Visit: Payer: Self-pay

## 2017-07-21 MED ORDER — DEXLANSOPRAZOLE 60 MG PO CPDR: 60.0000 mg | DELAYED_RELEASE_CAPSULE | Freq: Every day | ORAL | 3 refills | Status: DC

## 2017-07-21 NOTE — Telephone Encounter (Signed)
Spoke with pt and she is aware, script sent to pharmacy. Pt states she will call back to scheduled the EGD.

## 2017-07-25 MED FILL — CITALOPRAM HBR 20 MG TABLET: 20 | 30 days supply | Qty: 30 | Fill #1

## 2017-07-25 MED FILL — ?PANTOPRAZOLE SOD DR 40MG: 40 MG | 30 days supply | Qty: 30 | Fill #1

## 2017-07-25 MED FILL — LEVOTHYROXINE 125 MCG TAB: 125 | 30 days supply | Qty: 30 | Fill #1

## 2017-07-25 MED FILL — HYDROCHLOROTHIAZIDE 25 MG T: 25 | 30 days supply | Qty: 30 | Fill #1

## 2017-07-25 MED FILL — ?CYCLOBENZAPRINE 10 MG TABL: 10 | 30 days supply | Qty: 60 | Fill #1

## 2017-07-25 MED FILL — LOSARTAN POTASSIUM 25 MG TA: 25 | 30 days supply | Qty: 30 | Fill #1

## 2017-07-25 MED FILL — busPIRone HCL 7.5 MG TABS: 7.5 | 30 days supply | Qty: 60 | Fill #1

## 2017-07-25 MED FILL — !DEXILANT DR 60 MG CAPSULE: 60 | 30 days supply | Qty: 30 | Fill #0

## 2017-07-26 ENCOUNTER — Encounter: Payer: Self-pay | Admitting: Family Medicine

## 2017-07-26 ENCOUNTER — Ambulatory Visit: Payer: Self-pay

## 2017-07-26 ENCOUNTER — Ambulatory Visit: Payer: Self-pay | Attending: Family Medicine | Admitting: Family Medicine

## 2017-07-26 VITALS — BP 126/84 | HR 85 | Temp 98.1°F | Ht 65.0 in | Wt 223.2 lb

## 2017-07-26 DIAGNOSIS — E7800 Pure hypercholesterolemia, unspecified: Secondary | ICD-10-CM

## 2017-07-26 DIAGNOSIS — K2970 Gastritis, unspecified, without bleeding: Secondary | ICD-10-CM | POA: Insufficient documentation

## 2017-07-26 DIAGNOSIS — R05 Cough: Secondary | ICD-10-CM | POA: Insufficient documentation

## 2017-07-26 DIAGNOSIS — Z79899 Other long term (current) drug therapy: Secondary | ICD-10-CM | POA: Insufficient documentation

## 2017-07-26 DIAGNOSIS — E785 Hyperlipidemia, unspecified: Secondary | ICD-10-CM | POA: Insufficient documentation

## 2017-07-26 DIAGNOSIS — M654 Radial styloid tenosynovitis [de Quervain]: Secondary | ICD-10-CM | POA: Insufficient documentation

## 2017-07-26 DIAGNOSIS — E038 Other specified hypothyroidism: Secondary | ICD-10-CM

## 2017-07-26 DIAGNOSIS — Z23 Encounter for immunization: Secondary | ICD-10-CM

## 2017-07-26 DIAGNOSIS — M79671 Pain in right foot: Secondary | ICD-10-CM | POA: Insufficient documentation

## 2017-07-26 DIAGNOSIS — K219 Gastro-esophageal reflux disease without esophagitis: Secondary | ICD-10-CM

## 2017-07-26 DIAGNOSIS — F411 Generalized anxiety disorder: Secondary | ICD-10-CM

## 2017-07-26 DIAGNOSIS — M79672 Pain in left foot: Secondary | ICD-10-CM | POA: Insufficient documentation

## 2017-07-26 DIAGNOSIS — M722 Plantar fascial fibromatosis: Secondary | ICD-10-CM

## 2017-07-26 DIAGNOSIS — I10 Essential (primary) hypertension: Secondary | ICD-10-CM

## 2017-07-26 MED ORDER — PROPRANOLOL HCL 20 MG PO TABS: ORAL_TABLET | ORAL | 1 refills | Status: DC

## 2017-07-26 MED ORDER — ATORVASTATIN CALCIUM 20 MG PO TABS: 20.0000 mg | ORAL_TABLET | Freq: Every day | ORAL | 3 refills | Status: DC

## 2017-07-26 MED ORDER — BUSPIRONE HCL 7.5 MG PO TABS: 7.5000 mg | ORAL_TABLET | Freq: Two times a day (BID) | ORAL | 3 refills | Status: DC

## 2017-07-26 MED ORDER — HYDROCHLOROTHIAZIDE 25 MG PO TABS: 25.0000 mg | ORAL_TABLET | Freq: Every day | ORAL | 3 refills | Status: DC

## 2017-07-26 MED ORDER — DICLOFENAC SODIUM 1 % TD GEL: 4.0000 g | Freq: Four times a day (QID) | TRANSDERMAL | 1 refills | Status: DC

## 2017-07-26 MED ORDER — CYCLOBENZAPRINE HCL 10 MG PO TABS: ORAL_TABLET | ORAL | 3 refills | Status: DC

## 2017-07-26 MED ORDER — LOSARTAN POTASSIUM 25 MG PO TABS: 25.0000 mg | ORAL_TABLET | Freq: Every day | ORAL | 3 refills | Status: DC

## 2017-07-26 MED ORDER — CITALOPRAM HYDROBROMIDE 20 MG PO TABS: 20.0000 mg | ORAL_TABLET | Freq: Every day | ORAL | 3 refills | Status: DC

## 2017-07-26 MED FILL — VOLTAREN 1% GEL: 1 | 30 days supply | Qty: 100 | Fill #0

## 2017-07-26 MED FILL — ?ATORVASTATIN 20 MG TABLET: 20 | 30 days supply | Qty: 30 | Fill #0

## 2017-07-26 MED FILL — PROPRANOLOL 20 MG TABLET: 20 | 30 days supply | Qty: 30 | Fill #0

## 2017-07-26 NOTE — Progress Notes (Signed)
Subjective:  Patient ID: Julie Miranda, female    DOB: May 18, 1961  Age: 56 y.o. MRN: [id number]  CC: Hypertension; Cough; and Foot Pain   HPI Julie Miranda is a 56 year old female with a history of GERD, hypothyroidism, hypertension, anxiety, multiple arthralgias, de Quervain's tenosynovitis, carpal tunnel syndromeWho presents today for a follow-up visit.  She is requesting completion of a Form for physical as she will be enrolling in school.  She has been compliant with her medications and reports improvement in her anxiety and symptoms are controlled on her current regimen; denies suicidal ideation or intent.  Her right heel hurts when she initially appears weight on it but improves as walking progresses in symptoms have been present a couple of months. She also has pain in the medial aspect of her left sole the pain is absent at this time. Insoles have not helped her right foot symptoms.  She tolerates her antihypertensives and denies side effects. She also has a dry cough but no respiratory symptoms.    Past Medical History:  Diagnosis Date  . Allergy   . Anemia   . Anxiety   . Arthritis   . Colon polyps    x 3  . Gastric ulcer   . GERD (gastroesophageal reflux disease)   . Hyperlipidemia   . Hypertension   . Thyroid disease     Past Surgical History:  Procedure Laterality Date  . COLONOSCOPY    . GASTRIC BYPASS    . POLYPECTOMY      Allergies  Allergen Reactions  . Morphine And Related Nausea And Vomiting     Outpatient Medications Prior to Visit  Medication Sig Dispense Refill  . APAP (TYLENOL) 325 MG tablet Take 2 tablets (650 mg total) by mouth every 12 (twelve) hours as needed for headache. 30 tablet 1  . clonazePAM (KLONOPIN) 0.5 MG tablet   1  . dexlansoprazole (DEXILANT) 60 MG capsule Take 1 capsule (60 mg total) by mouth daily. 90 capsule 3  . levothyroxine (SYNTHROID, LEVOTHROID) 125 MCG tablet TAKE 1 TABLET BY MOUTH DAILY BEFORE BREAKFAST. 30  tablet 3  . linaclotide (LINZESS) 145 MCG CAPS capsule Take 1 capsule (145 mcg total) by mouth daily before breakfast. 90 capsule 3  . meclizine (ANTIVERT) 25 MG tablet Take 1 tablet (25 mg total) by mouth 3 (three) times daily as needed. 30 tablet 3  . polyethylene glycol powder (MIRALAX) powder Take 1 Container by mouth once. Use as directed for colonoscopy 9/6    . atorvastatin (LIPITOR) 20 MG tablet Take 1 tablet (20 mg total) by mouth daily. 30 tablet 3  . busPIRone (BUSPAR) 7.5 MG tablet Take 1 tablet (7.5 mg total) by mouth 2 (two) times daily. 60 tablet 3  . citalopram (CELEXA) 20 MG tablet Take 1 tablet (20 mg total) by mouth daily. 30 tablet 3  . cyclobenzaprine (FLEXERIL) 10 MG tablet TAKE 1 TABLET BY MOUTH 2 TIMES DAILY AS NEEDED FOR MUSCLE SPASMS. 60 tablet 3  . hydrochlorothiazide (HYDRODIURIL) 25 MG tablet Take 1 tablet (25 mg total) by mouth daily. 30 tablet 3  . losartan (COZAAR) 25 MG tablet Take 1 tablet (25 mg total) by mouth daily. 30 tablet 3  . ondansetron (ZOFRAN) 4 MG tablet Take 1 tablet (4 mg total) by mouth every 8 (eight) hours as needed for nausea. 20 tablet 0  . pantoprazole (PROTONIX) 40 MG tablet Take 1 tablet (40 mg total) by mouth daily. 30 tablet 3  . propranolol (INDERAL) 20  MG tablet Take 1 tablet (20 mg)  2 times a week and /or as needed for social or performance anxiety 30 tablet 1  . bisacodyl (DULCOLAX) 5 MG EC tablet Take 5 mg by mouth daily as needed for moderate constipation. X 4 per prep instructions for colonoscopy on 9/6     Facility-Administered Medications Prior to Visit  Medication Dose Route Frequency Provider Last Rate Last Dose  . 0.9 %  sodium chloride infusion  500 mL Intravenous Continuous Pyrtle, Lajuan Lines, MD        ROS Review of Systems  Constitutional: Negative for activity change, appetite change and fatigue.  HENT: Negative for congestion, sinus pressure and sore throat.   Eyes: Negative for visual disturbance.  Respiratory: Negative  for cough, chest tightness, shortness of breath and wheezing.   Cardiovascular: Negative for chest pain and palpitations.  Gastrointestinal: Negative for abdominal distention, abdominal pain and constipation.  Endocrine: Negative for polydipsia.  Genitourinary: Negative for dysuria and frequency.  Musculoskeletal:       See hpi  Skin: Negative for rash.  Neurological: Negative for tremors, light-headedness and numbness.  Hematological: Does not bruise/bleed easily.  Psychiatric/Behavioral: Negative for agitation and behavioral problems.    Objective:  BP 126/84   Pulse 85   Temp 98.1 F (36.7 C) (Oral)   Ht 5\' 5"  (1.651 m)   Wt 223 lb 3.2 oz (101.2 kg)   LMP  (LMP Unknown) Comment: pt. states that it has been over 1 year  SpO2 97%   BMI 37.14 kg/m   BP/Weight 07/26/2017 07/06/2017 06/04/9146  Systolic BP 829 562 -  Diastolic BP 84 99 -  Wt. (Lbs) 223.2 212 217.6  BMI 37.14 35.28 36.21      Physical Exam  Constitutional: She is oriented to person, place, and time. She appears well-developed and well-nourished.  Cardiovascular: Normal rate, normal heart sounds and intact distal pulses.   No murmur heard. Pulmonary/Chest: Effort normal and breath sounds normal. She has no wheezes. She has no rales. She exhibits no tenderness.  Abdominal: Soft. Bowel sounds are normal. She exhibits no distension and no mass. There is no tenderness.  Musculoskeletal: Normal range of motion.  Tenderness on palpation of right heel No tenderness on palpation of left foot  Neurological: She is alert and oriented to person, place, and time.     Lipid Panel     Component Value Date/Time   CHOL 219 (H) 04/26/2017 0917   TRIG 98 04/26/2017 0917   HDL 89 04/26/2017 0917   CHOLHDL 2.5 04/26/2017 0917   CHOLHDL 2.8 03/01/2016 1053   VLDL 26 03/01/2016 1053   LDLCALC 110 (H) 04/26/2017 0917    CMP Latest Ref Rng & Units 04/26/2017 03/01/2016 08/29/2015  Glucose 65 - 99 mg/dL 77 84 97  BUN 6 - 24  mg/dL 6 10 14   Creatinine 0.57 - 1.00 mg/dL 1.05(H) 1.00 0.87  Sodium 134 - 144 mmol/L 146(H) 139 140  Potassium 3.5 - 5.2 mmol/L 4.6 4.0 3.5  Chloride 96 - 106 mmol/L 106 101 100  CO2 20 - 29 mmol/L 26 30 32(H)  Calcium 8.7 - 10.2 mg/dL 9.6 9.9 9.1  Total Protein 6.0 - 8.5 g/dL 6.6 6.7 -  Total Bilirubin 0.0 - 1.2 mg/dL 0.3 0.5 -  Alkaline Phos 39 - 117 IU/L 84 105 -  AST 0 - 40 IU/L 24 31 -  ALT 0 - 32 IU/L 17 42(H) -    Assessment & Plan:  1. Essential hypertension, benign Controlled Low-sodium diet - losartan (COZAAR) 25 MG tablet; Take 1 tablet (25 mg total) by mouth daily.  Dispense: 30 tablet; Refill: 3 - hydrochlorothiazide (HYDRODIURIL) 25 MG tablet; Take 1 tablet (25 mg total) by mouth daily.  Dispense: 30 tablet; Refill: 3  2. Pure hypercholesterolemia Uncontrolled Low-cholesterol diet, less so modifications Lipid profile at her next visit - atorvastatin (LIPITOR) 20 MG tablet; Take 1 tablet (20 mg total) by mouth daily.  Dispense: 30 tablet; Refill: 3  3. Gastroesophageal reflux disease without esophagitis Stable Continue Dexilant  4. Generalized anxiety disorder Improved - propranolol (INDERAL) 20 MG tablet; Take 1 tablet (20 mg)  2 times a week and /or as needed for social or performance anxiety  Dispense: 30 tablet; Refill: 1 - citalopram (CELEXA) 20 MG tablet; Take 1 tablet (20 mg total) by mouth daily.  Dispense: 30 tablet; Refill: 3 - busPIRone (BUSPAR) 7.5 MG tablet; Take 1 tablet (7.5 mg total) by mouth 2 (two) times daily.  Dispense: 60 tablet; Refill: 3  5. Plantar fasciitis Uncontrolled Unable to place on NSAIDs due to GERD and gastritis We'll use topical NSAIDs If symptoms persist will consider bilateral feet x-ray and possible referral for steroid injection - diclofenac sodium (VOLTAREN) 1 % GEL; Apply 4 g topically 4 (four) times daily.  Dispense: 100 g; Refill: 1  6. Other specified hypothyroidism Uncontrolled We'll refill her levothyroxine  after TSH is obtained. - TSH   Meds ordered this encounter  Medications  . propranolol (INDERAL) 20 MG tablet    Sig: Take 1 tablet (20 mg)  2 times a week and /or as needed for social or performance anxiety    Dispense:  30 tablet    Refill:  1  . cyclobenzaprine (FLEXERIL) 10 MG tablet    Sig: TAKE 1 TABLET BY MOUTH 2 TIMES DAILY AS NEEDED FOR MUSCLE SPASMS.    Dispense:  60 tablet    Refill:  3    Must have office visit for refills  . citalopram (CELEXA) 20 MG tablet    Sig: Take 1 tablet (20 mg total) by mouth daily.    Dispense:  30 tablet    Refill:  3  . atorvastatin (LIPITOR) 20 MG tablet    Sig: Take 1 tablet (20 mg total) by mouth daily.    Dispense:  30 tablet    Refill:  3  . losartan (COZAAR) 25 MG tablet    Sig: Take 1 tablet (25 mg total) by mouth daily.    Dispense:  30 tablet    Refill:  3  . hydrochlorothiazide (HYDRODIURIL) 25 MG tablet    Sig: Take 1 tablet (25 mg total) by mouth daily.    Dispense:  30 tablet    Refill:  3  . busPIRone (BUSPAR) 7.5 MG tablet    Sig: Take 1 tablet (7.5 mg total) by mouth 2 (two) times daily.    Dispense:  60 tablet    Refill:  3  . diclofenac sodium (VOLTAREN) 1 % GEL    Sig: Apply 4 g topically 4 (four) times daily.    Dispense:  100 g    Refill:  1    Follow-up: Return in about 3 months (around 10/26/2017) for Follow-up on chronic medical conditions.   Arnoldo Morale MD

## 2017-07-26 NOTE — Patient Instructions (Signed)

## 2017-07-27 ENCOUNTER — Other Ambulatory Visit: Payer: Self-pay | Admitting: Family Medicine

## 2017-07-27 DIAGNOSIS — E038 Other specified hypothyroidism: Secondary | ICD-10-CM

## 2017-07-27 LAB — TSH: TSH: 21.51 u[IU]/mL — ABNORMAL HIGH (ref 0.450–4.500)

## 2017-07-27 MED ORDER — LEVOTHYROXINE SODIUM 150 MCG PO TABS: ORAL_TABLET | ORAL | 3 refills | Status: DC

## 2017-08-24 MED FILL — $LINZESS 145MCG CAPSULE: 145 | 30 days supply | Qty: 30 | Fill #0

## 2017-08-24 MED FILL — ?LEVOTHYROXINE 150 MCG TAB: 150 | 30 days supply | Qty: 30 | Fill #0

## 2017-09-29 MED FILL — ?CYCLOBENZAPRINE 10 MG TABL: 10 | 30 days supply | Qty: 60 | Fill #2

## 2017-09-29 MED FILL — HYDROCHLOROTHIAZIDE 25 MG T: 25 | 30 days supply | Qty: 30 | Fill #2

## 2017-09-29 MED FILL — busPIRone HCL 7.5 MG TABS: 7.5 | 30 days supply | Qty: 60 | Fill #2

## 2017-09-29 MED FILL — LOSARTAN POTASSIUM 25 MG TA: 25 | 30 days supply | Qty: 30 | Fill #2

## 2017-09-29 MED FILL — $LINZESS 145MCG CAPSULE: 145 | 30 days supply | Qty: 30 | Fill #1

## 2017-09-29 MED FILL — CITALOPRAM HBR 20 MG TABLET: 20 | 30 days supply | Qty: 30 | Fill #2

## 2017-09-29 MED FILL — ?PANTOPRAZOLE SOD DR 40MG: 40 MG | 30 days supply | Qty: 30 | Fill #2

## 2017-10-06 ENCOUNTER — Encounter: Payer: Self-pay | Admitting: Pharmacist

## 2017-10-06 ENCOUNTER — Telehealth: Payer: Self-pay | Admitting: Family Medicine

## 2017-10-06 DIAGNOSIS — M25531 Pain in right wrist: Secondary | ICD-10-CM

## 2017-10-06 DIAGNOSIS — M79672 Pain in left foot: Secondary | ICD-10-CM

## 2017-10-06 DIAGNOSIS — M79671 Pain in right foot: Secondary | ICD-10-CM

## 2017-10-06 DIAGNOSIS — M25532 Pain in left wrist: Principal | ICD-10-CM

## 2017-10-06 MED FILL — ?LEVOTHYROXINE 150 MCG TAB: 150 | 30 days supply | Qty: 30 | Fill #1

## 2017-10-06 NOTE — Progress Notes (Signed)
Pharmacy contacted me again - they do have the same manufacturer so there will be no change. Disregard previous note.  Of note, patient is 2 weeks late on picking up medication

## 2017-10-06 NOTE — Progress Notes (Signed)
Current levothyroxine manufacturer is on backorder and not available. Pharmacy will have to switch to another generic manufacturer and notified us of the switch. Informed pharmacy to place note on patient's bag that she will need labs in 6 weeks to monitor for any changes in thyroid levels.

## 2017-10-06 NOTE — Telephone Encounter (Signed)
Pt came in to request X-rays on her both feet, and both wrists.She states that she has not been feeling better since the last office visit. and would like a referral to have them done.

## 2017-10-07 NOTE — Telephone Encounter (Signed)
Ordered

## 2017-10-07 NOTE — Addendum Note (Signed)
Addended by: Arnoldo Morale on: 10/07/2017 01:57 PM   Modules accepted: Orders

## 2017-11-24 ENCOUNTER — Ambulatory Visit (HOSPITAL_COMMUNITY)
Admission: RE | Admit: 2017-11-24 | Discharge: 2017-11-24 | Disposition: A | Discharge status: Home / Self Care | Payer: Self-pay | Source: Ambulatory Visit | Attending: Family Medicine | Admitting: Family Medicine

## 2017-11-24 DIAGNOSIS — M25531 Pain in right wrist: Secondary | ICD-10-CM | POA: Insufficient documentation

## 2017-11-24 DIAGNOSIS — M19071 Primary osteoarthritis, right ankle and foot: Secondary | ICD-10-CM | POA: Insufficient documentation

## 2017-11-24 DIAGNOSIS — M25532 Pain in left wrist: Secondary | ICD-10-CM | POA: Insufficient documentation

## 2017-11-24 DIAGNOSIS — M79672 Pain in left foot: Secondary | ICD-10-CM | POA: Insufficient documentation

## 2017-11-24 DIAGNOSIS — M19031 Primary osteoarthritis, right wrist: Secondary | ICD-10-CM | POA: Insufficient documentation

## 2017-11-24 DIAGNOSIS — M19032 Primary osteoarthritis, left wrist: Secondary | ICD-10-CM | POA: Insufficient documentation

## 2017-11-24 DIAGNOSIS — M79671 Pain in right foot: Secondary | ICD-10-CM | POA: Insufficient documentation

## 2017-11-24 DIAGNOSIS — M8588 Other specified disorders of bone density and structure, other site: Secondary | ICD-10-CM | POA: Insufficient documentation

## 2017-12-01 ENCOUNTER — Telehealth: Payer: Self-pay | Admitting: Family Medicine

## 2017-12-01 DIAGNOSIS — M79671 Pain in right foot: Secondary | ICD-10-CM

## 2017-12-01 DIAGNOSIS — M79672 Pain in left foot: Principal | ICD-10-CM

## 2017-12-01 NOTE — Telephone Encounter (Signed)
Patient called and requested her xrays results. Please fu.

## 2017-12-02 NOTE — Telephone Encounter (Signed)
I had sent all results to her on 'my chart' on 11/24/17

## 2017-12-05 NOTE — Telephone Encounter (Signed)
Patient was called and informed to return phone call for x-ray results.

## 2017-12-05 NOTE — Telephone Encounter (Signed)
Patient returned phone call and was informed of x-ray results. Patient states that she is having a lot of pain in right foot and would like medication for pain.

## 2017-12-06 NOTE — Telephone Encounter (Signed)
She is unable to take oral NSAIDs due to gastritis, I am unable to place on Tylenol No. 3 or tramadol because she is allergic to morphine.  The only options we have available at this time are Voltaren gel, Flexeril which I previously gave her andTylenol extra strength. Could also refer her to a podiatrist if she so desires.

## 2017-12-07 NOTE — Telephone Encounter (Signed)
Patient was called and patient states that she would like the Flexeril and the referral to podiatry.

## 2017-12-08 MED ORDER — CYCLOBENZAPRINE HCL 10 MG PO TABS
ORAL_TABLET | ORAL | 3 refills | Status: DC
Start: 2017-12-08 — End: 2019-07-31

## 2017-12-08 MED FILL — ?CYCLOBENZAPRINE 10MG TB: 10 | 30 days supply | Qty: 60 | Fill #0

## 2017-12-08 NOTE — Telephone Encounter (Signed)
Patient was called and informed of lab results. 

## 2017-12-08 NOTE — Telephone Encounter (Signed)
Done

## 2017-12-23 MED FILL — ?ATORVASTATIN 20 MG TABLET: 20 | 30 days supply | Qty: 30 | Fill #1

## 2017-12-23 MED FILL — $LINZESS 145MCG CAPSULE: 145 | 30 days supply | Qty: 30 | Fill #2

## 2017-12-23 MED FILL — busPIRone HCL 7.5 MG TABS: 7.5 | 30 days supply | Qty: 60 | Fill #3

## 2017-12-23 MED FILL — ?CITALOPRAM HBR 20 MG TABLE: 20 | 30 days supply | Qty: 30 | Fill #3

## 2017-12-23 MED FILL — ?LEVOTHYROXINE 150 MCG TAB: 150 | 30 days supply | Qty: 30 | Fill #2

## 2017-12-23 MED FILL — LOSARTAN POTASSIUM 25 MG TA: 25 | 30 days supply | Qty: 30 | Fill #3

## 2017-12-23 MED FILL — ?PANTOPRAZOLE SOD DR 40MG T: 40 | 30 days supply | Qty: 30 | Fill #3

## 2017-12-23 MED FILL — HYDROCHLOROTHIAZIDE 25 MG T: 25 | 30 days supply | Qty: 30 | Fill #3

## 2018-01-16 ENCOUNTER — Ambulatory Visit: Payer: Self-pay

## 2018-01-16 ENCOUNTER — Ambulatory Visit (INDEPENDENT_AMBULATORY_CARE_PROVIDER_SITE_OTHER): Payer: No Typology Code available for payment source | Admitting: Podiatry

## 2018-01-16 VITALS — BP 138/90 | HR 97 | Ht 65.0 in

## 2018-01-16 DIAGNOSIS — M79605 Pain in left leg: Secondary | ICD-10-CM

## 2018-01-16 DIAGNOSIS — M79604 Pain in right leg: Secondary | ICD-10-CM

## 2018-01-16 DIAGNOSIS — M79671 Pain in right foot: Secondary | ICD-10-CM

## 2018-01-16 DIAGNOSIS — M79672 Pain in left foot: Secondary | ICD-10-CM

## 2018-01-16 MED ORDER — MELOXICAM 15 MG PO TABS: 15.0000 mg | ORAL_TABLET | Freq: Every day | ORAL | 0 refills | Status: AC

## 2018-01-16 MED ORDER — METHYLPREDNISOLONE 4 MG PO TBPK: ORAL_TABLET | ORAL | 0 refills | Status: DC

## 2018-01-16 MED FILL — MELOXICAM 15 MG TABLET: 15 | 30 days supply | Qty: 30 | Fill #0

## 2018-01-16 MED FILL — METHYLPREDNISOLONE 4 MG TAB: 4 | 6 days supply | Qty: 21 | Fill #0

## 2018-01-16 NOTE — Patient Instructions (Signed)
Start with the medrol dose pack and once complete you can take the meloxicam    Plantar Fasciitis Rehab Ask your health care provider which exercises are safe for you. Do exercises exactly as told by your health care provider and adjust them as directed. It is normal to feel mild stretching, pulling, tightness, or discomfort as you do these exercises, but you should stop right away if you feel sudden pain or your pain gets worse. Do not begin these exercises until told by your health care provider. Stretching and range of motion exercises These exercises warm up your muscles and joints and improve the movement and flexibility of your foot. These exercises also help to relieve pain. Exercise A: Plantar fascia stretch  1. Sit with your left / right leg crossed over your opposite knee. 2. Hold your heel with one hand with that thumb near your arch. With your other hand, hold your toes and gently pull them back toward the top of your foot. You should feel a stretch on the bottom of your toes or your foot or both. 3. Hold this stretch for__________ seconds. 4. Slowly release your toes and return to the starting position. Repeat __________ times. Complete this exercise __________ times a day. Exercise B: Gastroc, standing  1. Stand with your hands against a wall. 2. Extend your left / right leg behind you, and bend your front knee slightly. 3. Keeping your heels on the floor and keeping your back knee straight, shift your weight toward the wall without arching your back. You should feel a gentle stretch in your left / right calf. 4. Hold this position for __________ seconds. Repeat __________ times. Complete this exercise __________ times a day. Exercise C: Soleus, standing 1. Stand with your hands against a wall. 2. Extend your left / right leg behind you, and bend your front knee slightly. 3. Keeping your heels on the floor, bend your back knee and slightly shift your weight over the back leg.  You should feel a gentle stretch deep in your calf. 4. Hold this position for __________ seconds. Repeat __________ times. Complete this exercise __________ times a day. Exercise D: Gastrocsoleus, standing 1. Stand with the ball of your left / right foot on a step. The ball of your foot is on the walking surface, right under your toes. 2. Keep your other foot firmly on the same step. 3. Hold onto the wall or a railing for balance. 4. Slowly lift your other foot, allowing your body weight to press your heel down over the edge of the step. You should feel a stretch in your left / right calf. 5. Hold this position for __________ seconds. 6. Return both feet to the step. 7. Repeat this exercise with a slight bend in your left / right knee. Repeat __________ times with your left / right knee straight and __________ times with your left / right knee bent. Complete this exercise __________ times a day. Balance exercise This exercise builds your balance and strength control of your arch to help take pressure off your plantar fascia. Exercise E: Single leg stand 1. Without shoes, stand near a railing or in a doorway. You may hold onto the railing or door frame as needed. 2. Stand on your left / right foot. Keep your big toe down on the floor and try to keep your arch lifted. Do not let your foot roll inward. 3. Hold this position for __________ seconds. 4. If this exercise is too easy, you  can try it with your eyes closed or while standing on a pillow. Repeat __________ times. Complete this exercise __________ times a day. This information is not intended to replace advice given to you by your health care provider. Make sure you discuss any questions you have with your health care provider. Document Released: 10/11/2005 Document Revised: 06/15/2016 Document Reviewed: 08/25/2015 Elsevier Interactive Patient Education  2018 [employer].

## 2018-01-17 ENCOUNTER — Telehealth: Payer: Self-pay | Admitting: *Deleted

## 2018-01-17 NOTE — Telephone Encounter (Signed)
-----   Message from Trula Slade, DPM sent at 01/17/2018  6:49 AM EDT ----- Can you please let her know to only take the steroid (medrol dose pack) and not to take the meloxcam due to her kidney function? Thanks!!

## 2018-01-17 NOTE — Telephone Encounter (Signed)
Left message with pt's mtr, Doretha Sou, and I informed her that due to the importance of my message I would leave it with her. I told Berenice Primas, to have pt only take the medrol dose pack and not the mobic/meloxicam due to it may bother her kidneys. Berenice Primas states understanding and will tell pt.

## 2018-01-17 NOTE — Progress Notes (Signed)
Subjective:   Patient ID: Julie Miranda, female   DOB: 57 y.o.   MRN: [id number]   HPI 57 year old female presents the office today for concerns of bilateral foot pain mostly to the heel and the arch of the foot.  She is a history of a right ankle fracture in 2002.  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  She feels like at times her ankles get out in a week.  She states is been ongoing for about a year but is getting worse of the last 6 months.  She states that she is tried some over-the-counter inserts as well as a sleeve that she showed me.  She has some occasional tingling to her toes but she denies any radiating pain.  She said no recent injury.  She has no other concerns today.   Review of Systems  All other systems reviewed and are negative.  Past Medical History:  Diagnosis Date  . Allergy   . Anemia   . Anxiety   . Arthritis   . Colon polyps    x 3  . Gastric ulcer   . GERD (gastroesophageal reflux disease)   . Hyperlipidemia   . Hypertension   . Thyroid disease     Past Surgical History:  Procedure Laterality Date  . COLONOSCOPY    . GASTRIC BYPASS    . POLYPECTOMY       Current Outpatient Medications:  .  APAP (TYLENOL) 325 MG tablet, Take 2 tablets (650 mg total) by mouth every 12 (twelve) hours as needed for headache., Disp: 30 tablet, Rfl: 1 .  atorvastatin (LIPITOR) 20 MG tablet, Take 1 tablet (20 mg total) by mouth daily., Disp: 30 tablet, Rfl: 3 .  bisacodyl (DULCOLAX) 5 MG EC tablet, Take 5 mg by mouth daily as needed for moderate constipation. X 4 per prep instructions for colonoscopy on 9/6, Disp: , Rfl:  .  busPIRone (BUSPAR) 7.5 MG tablet, Take 1 tablet (7.5 mg total) by mouth 2 (two) times daily., Disp: 60 tablet, Rfl: 3 .  citalopram (CELEXA) 20 MG tablet, Take 1 tablet (20 mg total) by mouth daily., Disp: 30 tablet, Rfl: 3 .  clonazePAM (KLONOPIN) 0.5 MG tablet, , Disp: , Rfl: 1 .  cyclobenzaprine (FLEXERIL) 10 MG  tablet, TAKE 1 TABLET BY MOUTH 2 TIMES DAILY AS NEEDED FOR MUSCLE SPASMS., Disp: 60 tablet, Rfl: 3 .  dexlansoprazole (DEXILANT) 60 MG capsule, Take 1 capsule (60 mg total) by mouth daily., Disp: 90 capsule, Rfl: 3 .  diclofenac sodium (VOLTAREN) 1 % GEL, Apply 4 g topically 4 (four) times daily., Disp: 100 g, Rfl: 1 .  hydrochlorothiazide (HYDRODIURIL) 25 MG tablet, Take 1 tablet (25 mg total) by mouth daily., Disp: 30 tablet, Rfl: 3 .  levothyroxine (SYNTHROID, LEVOTHROID) 150 MCG tablet, TAKE 1 TABLET BY MOUTH DAILY BEFORE BREAKFAST., Disp: 30 tablet, Rfl: 3 .  linaclotide (LINZESS) 145 MCG CAPS capsule, Take 1 capsule (145 mcg total) by mouth daily before breakfast., Disp: 90 capsule, Rfl: 3 .  losartan (COZAAR) 25 MG tablet, Take 1 tablet (25 mg total) by mouth daily., Disp: 30 tablet, Rfl: 3 .  meclizine (ANTIVERT) 25 MG tablet, Take 1 tablet (25 mg total) by mouth 3 (three) times daily as needed., Disp: 30 tablet, Rfl: 3 .  meloxicam (MOBIC) 15 MG tablet, Take 1 tablet (15 mg total) by mouth daily., Disp: 30 tablet, Rfl: 0 .  methylPREDNISolone (MEDROL DOSEPAK)  4 MG TBPK tablet, Take as directed, Disp: 21 tablet, Rfl: 0 .  polyethylene glycol powder (MIRALAX) powder, Take 1 Container by mouth once. Use as directed for colonoscopy 9/6, Disp: , Rfl:  .  propranolol (INDERAL) 20 MG tablet, Take 1 tablet (20 mg)  2 times a week and /or as needed for social or performance anxiety, Disp: 30 tablet, Rfl: 1  Current Facility-Administered Medications:  .  0.9 %  sodium chloride infusion, 500 mL, Intravenous, Continuous, Pyrtle, Lajuan Lines, MD  Allergies  Allergen Reactions  . Morphine And Related Nausea And Vomiting    Social History   Socioeconomic History  . Marital status: Single    Spouse name: Not on file  . Number of children: Not on file  . Years of education: Not on file  . Highest education level: Not on file  Occupational History  . Not on file  Social Needs  . Financial resource  strain: Not on file  . Food insecurity:    Worry: Not on file    Inability: Not on file  . Transportation needs:    Medical: Not on file    Non-medical: Not on file  Tobacco Use  . Smoking status: Never Smoker  . Smokeless tobacco: Never Used  Substance and Sexual Activity  . Alcohol use: Yes    Alcohol/week: 0.6 oz    Types: 1 Glasses of wine per week    Comment: occasional  . Drug use: No  . Sexual activity: Not Currently    Birth control/protection: Injection  Lifestyle  . Physical activity:    Days per week: Not on file    Minutes per session: Not on file  . Stress: Not on file  Relationships  . Social connections:    Talks on phone: Not on file    Gets together: Not on file    Attends religious service: Not on file    Active member of club or organization: Not on file    Attends meetings of clubs or organizations: Not on file    Relationship status: Not on file  . Intimate partner violence:    Fear of current or ex partner: Not on file    Emotionally abused: Not on file    Physically abused: Not on file    Forced sexual activity: Not on file  Other Topics Concern  . Not on file  Social History Narrative  . Not on file        Objective:  Physical Exam  General: AAO x3, NAD  Dermatological: Skin is warm, dry and supple bilateral. Nails x 10 are well manicured; remaining integument appears unremarkable at this time. There are no open sores, no preulcerative lesions, no rash or signs of infection present.  Vascular: Dorsalis Pedis artery and Posterior Tibial artery pedal pulses are 2/4 bilateral with immedate capillary fill time. There is no pain with calf compression, swelling, warmth, erythema.   Neruologic: Grossly intact via light touch bilateral. Protective threshold with Semmes Wienstein monofilament intact to all pedal sites bilateral.  Negative Tinel sign bilaterally.  Subjectively she has some numbness and tingling to her toes but overall sensation  appears to be intact.  Musculoskeletal: There is tenderness palpation along the plantar medial tubercle of the calcaneus at the insertion of the plantar fascia bilaterally is also mild discomfort on the medial band plantar fascial the arch of the foot.  There is no significant pain to the ankle joint at this time and ankle, subtalar joint  range of motion intact.  There is no pain to the dorsal aspect of the foot.  There is no overlying edema, erythema, increase in warmth.  Achilles tendon appears to be intact.  Muscular strength 5/5 in all groups tested bilateral.  Gait: Unassisted, Nonantalgic.       Assessment:   Bilateral foot pain likely plantar fasciitis    Plan:  -Treatment options discussed including all alternatives, risks, and complications -Etiology of symptoms were discussed -X-rays were obtained and reviewed with the patient.  There is no evidence of acute fracture or stress fracture identified today. -Medrol Dosepak prescribed.  We discussed a steroid injection.  We will do this next appointment if symptoms continue. -Discussed stretching, icing exercises daily. -We discussed a more custom orthotic in order to help support her foot at the inserts that she has purchased a very flexible.  We also discussed a more supportive shoe. -Discussed physical therapy.  She should be getting insurance soon and then we will start physical therapy. -Follow-up as scheduled or sooner if needed.  Call any questions or concerns or any worsening of symptoms.  Trula Slade DPM

## 2018-01-19 ENCOUNTER — Telehealth: Payer: Self-pay | Admitting: Podiatry

## 2018-01-19 NOTE — Telephone Encounter (Signed)
Lets do voltaren gel and hold steroids.

## 2018-01-19 NOTE — Telephone Encounter (Signed)
Please order anti-inflammatory cream

## 2018-01-19 NOTE — Telephone Encounter (Signed)
I spoke with pt and she states since she started the prednisone her stomach has been hurting and cramping. I asked pt if she had taken the pills as directed on the medication card and with food. Pt states she was taking as directed and took with and with out food, but continued to have pain. I told pt to hold off on the medrol dose pack until I called with instructions.

## 2018-01-19 NOTE — Telephone Encounter (Signed)
I saw Dr. Jacqualyn Posey on Monday and I had a call back about one of the medications I was supposed to start. I'm calling about the second medication that he gave me. Ever since I started the medrol dosepak I have been really ill. So if someone can call me back. My number is [phone].

## 2018-01-20 MED ORDER — DICLOFENAC SODIUM 1 % TD GEL: 4.0000 g | Freq: Four times a day (QID) | TRANSDERMAL | 2 refills | Status: DC

## 2018-01-20 MED ORDER — NONFORMULARY OR COMPOUNDED ITEM: 2 refills | Status: DC

## 2018-01-20 MED FILL — DICLOFENAC SODIUM 1% GEL: 1 | 12 days supply | Qty: 100 | Fill #0

## 2018-01-20 NOTE — Addendum Note (Signed)
Addended by: Harriett Sine D on: 01/20/2018 12:38 PM   Modules accepted: Orders

## 2018-01-20 NOTE — Telephone Encounter (Addendum)
I spoke with pt's mtr, Berenice Primas told her I would call pt's voltaren gel to the [facility]. Berenice Primas asked if the it was the 1% and I told her yes, Berenice Primas states that didn't help her before. Berenice Primas asked if pt could get something else for pain and I told her I would speak with Dr. Jacqualyn Posey and call again. Berenice Primas states I should talk with her about the pain medication and I told her I would speak with the pt.

## 2018-01-20 NOTE — Addendum Note (Signed)
Addended by: Harriett Sine D on: 01/20/2018 04:56 PM   Modules accepted: Orders

## 2018-01-23 MED ORDER — DICLOFENAC EPOLAMINE 1.3 % TD PTCH: 1.0000 | MEDICATED_PATCH | Freq: Two times a day (BID) | TRANSDERMAL | 2 refills | Status: DC

## 2018-01-23 NOTE — Addendum Note (Signed)
Addended by: Harriett Sine D on: 01/23/2018 11:07 AM   Modules accepted: Orders

## 2018-01-23 NOTE — Telephone Encounter (Signed)
If she needs pain medicine she should come in to be seen. Typically don't prescribe narcotics for this. We can try a flector patch

## 2018-01-24 ENCOUNTER — Telehealth: Payer: Self-pay | Admitting: Podiatry

## 2018-01-24 MED FILL — CYCLOBENZAPRINE 10 MG TAB: 10 | 30 days supply | Qty: 60 | Fill #1

## 2018-01-24 MED FILL — busPIRone HCL 7.5 MG TABS: 7.5 | 30 days supply | Qty: 60 | Fill #0

## 2018-01-24 MED FILL — ?LEVOTHYROXINE 150 MCG TAB: 150 | 30 days supply | Qty: 30 | Fill #3

## 2018-01-24 MED FILL — $LINZESS 145MCG CAPSULE: 145 | 30 days supply | Qty: 30 | Fill #3

## 2018-01-24 MED FILL — ATORVASTATIN 20 MG TABLET: 20 | 30 days supply | Qty: 30 | Fill #2

## 2018-01-24 MED FILL — LOSARTAN POTASSIUM 25 MG TA: 25 | 30 days supply | Qty: 30 | Fill #0

## 2018-01-24 MED FILL — ?CITALOPRAM HBR 20 MG TABLE: 20 | 30 days supply | Qty: 30 | Fill #0

## 2018-01-24 MED FILL — HYDROCHLOROTHIAZIDE 25 MG T: 25 | 30 days supply | Qty: 30 | Fill #0

## 2018-01-24 NOTE — Telephone Encounter (Signed)
Unable to leave a message informing pt the flector patch was a stronger strength of the voltaren gel, the mailbox was full.

## 2018-01-24 NOTE — Telephone Encounter (Signed)
I picked up the medication that Dr. Jacqualyn Posey prescribed. It's the same medication I have at home. It doesn't help, I can't use it. If the nurse can please call me back at [phone]. Thank you.

## 2018-01-24 NOTE — Telephone Encounter (Signed)
Dr. Jacqualyn Posey states pt can use BioFreeze, or Aspercreme with lidocaine or come in for a boot.

## 2018-01-25 NOTE — Telephone Encounter (Signed)
Left message with pt's mtr, Berenice Primas with Dr. Leigh Aurora orders.

## 2018-02-01 ENCOUNTER — Ambulatory Visit: Payer: Self-pay | Admitting: Family Medicine

## 2018-02-14 ENCOUNTER — Ambulatory Visit: Payer: No Typology Code available for payment source | Admitting: Podiatry

## 2018-03-08 ENCOUNTER — Ambulatory Visit: Payer: Self-pay | Admitting: Family Medicine

## 2018-03-28 ENCOUNTER — Ambulatory Visit: Payer: No Typology Code available for payment source | Admitting: Podiatry

## 2018-04-06 ENCOUNTER — Ambulatory Visit (INDEPENDENT_AMBULATORY_CARE_PROVIDER_SITE_OTHER): Payer: 59 | Admitting: Podiatry

## 2018-04-06 ENCOUNTER — Encounter: Payer: Self-pay | Admitting: Podiatry

## 2018-04-06 DIAGNOSIS — L853 Xerosis cutis: Secondary | ICD-10-CM

## 2018-04-06 DIAGNOSIS — B351 Tinea unguium: Secondary | ICD-10-CM | POA: Diagnosis not present

## 2018-04-06 DIAGNOSIS — M722 Plantar fascial fibromatosis: Secondary | ICD-10-CM | POA: Diagnosis not present

## 2018-04-06 MED ORDER — METHYLPREDNISOLONE 4 MG PO TBPK: ORAL_TABLET | ORAL | 0 refills | Status: DC

## 2018-04-06 MED ORDER — UREA 40 % EX CREA: 1.0000 "application " | TOPICAL_CREAM | Freq: Every day | CUTANEOUS | 0 refills | Status: DC

## 2018-04-10 ENCOUNTER — Telehealth: Payer: Self-pay | Admitting: Podiatry

## 2018-04-10 NOTE — Telephone Encounter (Signed)
I had ordered urea cream for her. We can try through [facility] or she can get it from [place].

## 2018-04-10 NOTE — Telephone Encounter (Signed)
I was in to see Dr. Jacqualyn Posey. He explained to his nurse that he wanted her to give me a prescription to put under my feet to keep my feet soft because I have this hard skin. When I went to the pharmacy, I only had 1 prescription. Can you please have the doctor give you another prescription for me? I'm at [phone]. Thank you.

## 2018-04-11 NOTE — Telephone Encounter (Signed)
I spoke with pt's mtr, Berenice Primas and she states she will pick up in the [place] office.

## 2018-04-11 NOTE — Progress Notes (Signed)
Subjective: 57 year old female presents the office today for follow-up evaluation and concerns of continued pain to both feet.  She states that when I last saw her she did not get the steroids and she is only been doing stretching exercises.  She states that she continues to have pain to her feet.  She tried Biofreeze but this is not helped.  She states her pain is gotten worse the last 2 months.  She states that she had a much dry skin on the bottom of her feet she try to get it off and made the skin raw but not able to get all the dead skin off.  She denies any recent injury or trauma to her feet.  No increase in swelling. Denies any systemic complaints such as fevers, chills, nausea, vomiting. No acute changes since last appointment, and no other complaints at this time.   Objective: AAO x3, NAD DP/PT pulses palpable bilaterally, CRT less than 3 seconds Tenderness to palpation along the plantar medial tubercle of the calcaneus at the insertion of plantar fascia on the left and right foot. There is no pain along the course of the plantar fascia within the arch of the foot. Plantar fascia appears to be intact. There is no pain with lateral compression of the calcaneus or pain with vibratory sensation. There is no pain along the course or insertion of the achilles tendon. No other areas of tenderness to bilateral lower extremities. Negative tinel sign Throat hyperkeratotic skin plantar aspect bilateral feet.  There is a well area along the central aspect of the arch of the left foot where she try to get off the hyperkeratotic tissue. She is also concerned about the toenail discoloration she has been thickening, brown discoloration.  There is no pain in the nails no signs of any redness or drainage. No open lesions or pre-ulcerative lesions.  No pain with calf compression, swelling, warmth, erythema  Assessment: Bilateral chronic foot pain likely plantar fasciitis with dry skin, likely  onychomycosis  Plan: -All treatment options discussed with the patient including all alternatives, risks, complications.  -In regards to the plantar fascial, foot pain I again prescribed a Medrol Dosepak for her.  Also dispensed bilateral plantar fascial braces.  Discussed stretching, icing exercises daily.  Discussed a steroid injection as well as she declined.  We did check orthotic coverage for her.  Discussed physical therapy as well. -Prescribed urea cream for the dry skin. -I debrided the nails today and this was sent to [facility] for culture.  -Patient encouraged to call the office with any questions, concerns, change in symptoms.   Return in about 6 weeks (around 05/18/2018).   Trula Slade DPM

## 2018-04-13 ENCOUNTER — Telehealth: Payer: Self-pay | Admitting: Podiatry

## 2018-04-13 NOTE — Telephone Encounter (Signed)
Called pt with insurance coverage for the orthotics. Covered at 90% after 3000.00 deductible.(pt has met 0) I gave pt cost and she asked if we had payment options. I told pt we do the cost is 398.00 and we ask for half down when she is scanned or molded for orthotics and then make payments. She is wanting to proceed and is scheduled to see Liliane Channel on 6.24.19

## 2018-04-17 ENCOUNTER — Other Ambulatory Visit: Payer: 59 | Admitting: Orthotics

## 2018-05-18 ENCOUNTER — Encounter: Payer: Self-pay | Admitting: Podiatry

## 2018-05-18 ENCOUNTER — Ambulatory Visit (INDEPENDENT_AMBULATORY_CARE_PROVIDER_SITE_OTHER): Payer: 59 | Admitting: Podiatry

## 2018-05-18 ENCOUNTER — Encounter

## 2018-05-18 DIAGNOSIS — M792 Neuralgia and neuritis, unspecified: Secondary | ICD-10-CM

## 2018-05-18 DIAGNOSIS — M722 Plantar fascial fibromatosis: Secondary | ICD-10-CM

## 2018-05-18 MED ORDER — GABAPENTIN 100 MG PO CAPS: 100.0000 mg | ORAL_CAPSULE | Freq: Every day | ORAL | 0 refills | Status: DC

## 2018-05-19 ENCOUNTER — Telehealth: Payer: Self-pay | Admitting: *Deleted

## 2018-05-19 NOTE — Telephone Encounter (Signed)
Pt states the wrap on her foot is too tight.

## 2018-05-19 NOTE — Telephone Encounter (Signed)
I spoke to pt and she states she had to take the wrap off there was a painful crease in the center of the foot. I told pt to allow the crease to calm down and rest, ice 3-4 times a day for 15-20 minutes protecting the skin from the ice with a cloth, and I would retape in the morning. Pt states she has an appt on Monday already.

## 2018-05-21 NOTE — Progress Notes (Signed)
Subjective: 57 year old female presents the office today for concerns and continued pain in both of her feet.  She said that she is still gets pain to the bottom of her heels and the arch of her foot but she feels that her toes on both feet from the ball of her foot to the toes are numb and she could not feel them.  She does describe sharp pain to her feet at times.  No recent injury.  She states that her nails are getting somewhat better denies any pain in the nails.  She has no other concerns today. Denies any systemic complaints such as fevers, chills, nausea, vomiting. No acute changes since last appointment, and no other complaints at this time.   Objective: AAO x3, NAD DP/PT pulses palpable bilaterally, CRT less than 3 seconds There is continuation of tenderness palpation on the plantar medial tubercle of the calcaneus at the insertion of plantar fascia bilaterally.  There is also mild discomfort on the medial band plantar fascia the arch of the foot.  Negative Tinel sign.  There is no area of tenderness.  Subjectively she is getting numbness from the distal metatarsals to the toes on both feet and appears to be up to all the toes.  There is no neuroma palpable.  There is no area pinpoint tenderness otherwise. Overall the nails appear to be about the same. No open lesions or pre-ulcerative lesions.  No pain with calf compression, swelling, warmth, erythema  Assessment: Bilateral foot pain, plantar fasciitis however concern for neuritis, neuropathy symptoms; onychodystrophy  Plan: -All treatment options discussed with the patient including all alternatives, risks, complications.  -We long discussion regards to treatment options.  I am concerned about the neuritis and the neuropathy type symptoms.  Start gabapentin and trial we discussed side effects of medication.  Discussed nerve conduction test. -I still believe still complains of plantar fasciitis.  Discussed arch support inside of her  shoes.  She has not been able to do this.  We did do a plantar fascial taping bilaterally today.  Continue stretching, icing daily.  Discussed possible MRI. -Continue urea cream for the nails. -Patient encouraged to call the office with any questions, concerns, change in symptoms.   -RTC 3 weeks or sooner if needed. She states that she is doing to try to come in to get molded for inserts.   Trula Slade DPM

## 2018-05-22 ENCOUNTER — Ambulatory Visit (INDEPENDENT_AMBULATORY_CARE_PROVIDER_SITE_OTHER): Payer: Self-pay

## 2018-05-22 DIAGNOSIS — M722 Plantar fascial fibromatosis: Secondary | ICD-10-CM

## 2018-05-24 NOTE — Progress Notes (Signed)
Patient is here to have plantar fascial taping redone on her foot.  She currently states that her both of her feet are still hurting.  Plantar fascial taping applied to both feet bilateral.  Advised patient to keep her feet dry for 2 to 5 days.  She is to keep her current follow-up appointment with Dr. Jacqualyn Posey.

## 2018-06-12 ENCOUNTER — Telehealth: Payer: Self-pay | Admitting: Podiatry

## 2018-06-12 NOTE — Telephone Encounter (Signed)
It is negative for fungus. Can try urea cream for the nails and oral biotin supplements .

## 2018-06-12 NOTE — Telephone Encounter (Signed)
I informed pt of Dr. Leigh Aurora review of results and orders. Pt states she has Revitaderm 40 and will get the biotin.

## 2018-06-12 NOTE — Telephone Encounter (Signed)
Dr. Jacqualyn Posey took a biopsy of my toenail and he was supposed to give me the results of that. I have not heard anything. If the nurse could call and give the results to me I would appreciate that. I can be reached at [phone]. Thank you.

## 2018-06-27 ENCOUNTER — Telehealth: Payer: Self-pay | Admitting: *Deleted

## 2018-06-27 MED ORDER — GABAPENTIN 100 MG PO CAPS: 100.0000 mg | ORAL_CAPSULE | Freq: Every day | ORAL | 0 refills | Status: DC

## 2018-06-27 NOTE — Telephone Encounter (Signed)
I informed pt of Dr. Leigh Aurora reorder of the gabapentin.

## 2018-06-27 NOTE — Telephone Encounter (Signed)
-----   Message from Marlou Sa sent at 06/27/2018  3:57 PM EDT ----- Regarding: Rx refill Contact: [phone] Pt is requesting refill on Gabapentin. Dr. Jacqualyn Posey is the provider she sees. Please advise.Marland Kitchen

## 2018-07-24 MED FILL — CYCLOBENZAPRINE 10 MG TAB: 10 | 30 days supply | Qty: 60 | Fill #2

## 2018-08-30 ENCOUNTER — Telehealth: Payer: Self-pay | Admitting: Podiatry

## 2018-08-30 MED ORDER — GABAPENTIN 100 MG PO CAPS
100.0000 mg | ORAL_CAPSULE | Freq: Every day | ORAL | 0 refills | Status: DC
Start: 2018-08-30 — End: 2018-08-31

## 2018-08-30 NOTE — Addendum Note (Signed)
Addended by: Harriett Sine D on: 08/30/2018 11:05 AM   Modules accepted: Orders

## 2018-08-30 NOTE — Telephone Encounter (Signed)
I need the nurse to call [facility] on [address]. I'm trying to get the gabapentin 100 mg refilled and I have no refills left. My foot is hurting really bad.

## 2018-08-30 NOTE — Telephone Encounter (Signed)
I informed pt Dr. Jacqualyn Posey had wanted to see her in 07/2018, and would refill the GAbapentin for 1 month, but no future refills until seen in office.

## 2018-08-31 ENCOUNTER — Telehealth: Payer: Self-pay | Admitting: Podiatry

## 2018-08-31 MED ORDER — GABAPENTIN 100 MG PO CAPS: 100.0000 mg | ORAL_CAPSULE | Freq: Every day | ORAL | 0 refills | Status: DC

## 2018-08-31 NOTE — Telephone Encounter (Signed)
Left message informing pt the gabapentin had been sent to the [place], and I apologized for the delay.

## 2018-08-31 NOTE — Addendum Note (Signed)
Addended by: Harriett Sine D on: 08/31/2018 03:51 PM   Modules accepted: Orders

## 2018-08-31 NOTE — Telephone Encounter (Signed)
I had called about a prescription for gabapentin which I got the request. However, it was sent to the wrong [facility]. I use the [facility] on [address] and NOT on [address]. My number is [phone].

## 2018-12-26 NOTE — Progress Notes (Deleted)
[facility]    Provider:  Dr Jaynee Eagles Referring Provider: Milagros Evener MD Primary Care Provider: Milagros Evener MD  CC:  ***  HPI:  Graylyn Bunney is a 58 y.o. female here as requested by Dr. Zadie Rhine for chronic daily headaches.  Past medical history hypothyroidism, hypertension, insomnia, chronic tension type headache not intractable, GERD, anxiety.  Dr. Dahlia Bailiff question whether Botox would help.  Medications tried: Gabapentin, Flexeril, citalopram, nortriptyline.  Reviewed notes, labs and imaging from outside physicians, which showed ***  Reviewed notes from Dr. Zadie Rhine.  Patient has a known history of chronic tension type headaches, general anxiety disorder, hypertension and insomnia.  Patient presented last to Dr. Radene Ou for 6 to 8 days of headache, bandlike around the head and constant.  Noted some mild photophobia.  Occasional nausea.  No numbness or tingling.  Multiple prior treatments have not been helpful.  She drinks minimal caffeine daily.  She does note poor sleep on a chronic basis.  She was diagnosed with migraines in the past.  She describes her headaches bilateral, dull aching pain, tried Excedrin Migraine.  No focal neurologic findings.  Reviewed labs taken October 03, 2018 which included CBC with white blood cells 3.2, CMP unremarkable with BUN 8 and creatinine 0.83, thyroid is also followed and has been taken recently.   Review of Systems: Patient complains of symptoms per HPI as well as the following symptoms ***. Pertinent negatives and positives per HPI. All others negative.   Social History   Socioeconomic History  . Marital status: Single    Spouse name: Not on file  . Number of children: Not on file  . Years of education: Not on file  . Highest education level: Not on file  Occupational History  . Not on file  Social Needs  . Financial resource strain: Not on file  . Food insecurity:    Worry: Not on file    Inability: Not on  file  . Transportation needs:    Medical: Not on file    Non-medical: Not on file  Tobacco Use  . Smoking status: Never Smoker  . Smokeless tobacco: Never Used  Substance and Sexual Activity  . Alcohol use: Yes    Alcohol/week: 1.0 standard drinks    Types: 1 Glasses of wine per week    Comment: occasional  . Drug use: No  . Sexual activity: Not Currently    Birth control/protection: Injection  Lifestyle  . Physical activity:    Days per week: Not on file    Minutes per session: Not on file  . Stress: Not on file  Relationships  . Social connections:    Talks on phone: Not on file    Gets together: Not on file    Attends religious service: Not on file    Active member of club or organization: Not on file    Attends meetings of clubs or organizations: Not on file    Relationship status: Not on file  . Intimate partner violence:    Fear of current or ex partner: Not on file    Emotionally abused: Not on file    Physically abused: Not on file    Forced sexual activity: Not on file  Other Topics Concern  . Not on file  Social History Narrative  . Not on file    Family History  Problem Relation Age of Onset  . Diabetes Mother   . Diabetes Maternal Aunt        x  2  . Heart disease Sister   . Pancreatic cancer Sister   . Liver cancer Maternal Aunt   . Colon cancer Neg Hx   . Kidney disease Neg Hx   . Colon polyps Neg Hx   . Esophageal cancer Neg Hx   . Rectal cancer Neg Hx   . Stomach cancer Neg Hx     Past Medical History:  Diagnosis Date  . Allergy   . Anemia   . Anxiety   . Arthritis   . Colon polyps    x 3  . Gastric ulcer   . GERD (gastroesophageal reflux disease)   . Hyperlipidemia   . Hypertension   . Thyroid disease     Patient Active Problem List   Diagnosis Date Noted  . Carpal tunnel syndrome 04/25/2017  . Cervicalgia 07/30/2016  . Synovitis of shoulder 05/21/2016  . Bilateral shoulder pain 03/08/2016  . Pure hypercholesterolemia  03/03/2016  . Costochondritis 03/01/2016  . Generalized anxiety disorder 03/01/2016  . De Quervain's tenosynovitis, bilateral 07/23/2015  . Neutropenia ([place]) 07/11/2015  . GERD (gastroesophageal reflux disease) 10/30/2013  . Endometrial mass 10/04/2013  . Leukocytopenia, unspecified 09/24/2013  . Dehydration 06/28/2013  . Anemia, unspecified 06/28/2013  . Acute low back pain due to trauma 06/28/2013  . Hypothyroidism 06/27/2013  . Syncope and collapse 06/27/2013  . Arthritis 06/27/2013  . Acute renal failure ([place]) 06/27/2013  . Essential hypertension, benign 06/27/2013  . 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 Chronic lower back pain 06/27/2013  . Hypokalemia 06/27/2013  . Hypertension 06/27/2013    Past Surgical History:  Procedure Laterality Date  . COLONOSCOPY    . GASTRIC BYPASS    . POLYPECTOMY      Current Outpatient Medications  Medication Sig Dispense Refill  . APAP (TYLENOL) 325 MG tablet Take 2 tablets (650 mg total) by mouth every 12 (twelve) hours as needed for headache. 30 tablet 1  . atorvastatin (LIPITOR) 20 MG tablet Take 1 tablet (20 mg total) by mouth daily. 30 tablet 3  . bisacodyl (DULCOLAX) 5 MG EC tablet Take 5 mg by mouth daily as needed for moderate constipation. X 4 per prep instructions for colonoscopy on 9/6    . busPIRone (BUSPAR) 7.5 MG tablet Take 1 tablet (7.5 mg total) by mouth 2 (two) times daily. 60 tablet 3  . citalopram (CELEXA) 20 MG tablet Take 1 tablet (20 mg total) by mouth daily. 30 tablet 3  . clonazePAM (KLONOPIN) 0.5 MG tablet   1  . cyclobenzaprine (FLEXERIL) 10 MG tablet TAKE 1 TABLET BY MOUTH 2 TIMES DAILY AS NEEDED FOR MUSCLE SPASMS. 60 tablet 3  . dexlansoprazole (DEXILANT) 60 MG capsule Take 1 capsule (60 mg total) by mouth daily. 90 capsule 3  . diclofenac (FLECTOR) 1.3 % PTCH Place 1 patch onto the skin 2 (two) times daily. 60 patch 2  . diclofenac sodium (VOLTAREN) 1 % GEL Apply 4 g topically 4 (four) times daily. 100 g 1   . diclofenac sodium (VOLTAREN) 1 % GEL Apply 4 g topically 4 (four) times daily. 1 Tube 2  . gabapentin (NEURONTIN) 100 MG capsule Take 1 capsule (100 mg total) by mouth at bedtime. 30 capsule 0  . hydrochlorothiazide (HYDRODIURIL) 25 MG tablet Take 1 tablet (25 mg total) by mouth daily. 30 tablet 3  . levothyroxine (SYNTHROID, LEVOTHROID) 150 MCG tablet TAKE 1 TABLET BY MOUTH DAILY BEFORE BREAKFAST. 30 tablet 3  . linaclotide (LINZESS) 145 MCG CAPS capsule Take 1 capsule (  145 mcg total) by mouth daily before breakfast. 90 capsule 3  . losartan (COZAAR) 25 MG tablet Take 1 tablet (25 mg total) by mouth daily. 30 tablet 3  . meclizine (ANTIVERT) 25 MG tablet Take 1 tablet (25 mg total) by mouth 3 (three) times daily as needed. 30 tablet 3  . meloxicam (MOBIC) 15 MG tablet Take 1 tablet (15 mg total) by mouth daily. 30 tablet 0  . methylPREDNISolone (MEDROL DOSEPAK) 4 MG TBPK tablet Take as directed 21 tablet 0  . methylPREDNISolone (MEDROL DOSEPAK) 4 MG TBPK tablet Take as directed 21 tablet 0  . NONFORMULARY OR COMPOUNDED ITEM [facility]:  Antiinflammatory Cream - Diclofenac 3%, Baclofen 2%, Lidocaine 2%, apply 1-2 grams to affected area 3-4 times a day. 120 each 2  . polyethylene glycol powder (MIRALAX) powder Take 1 Container by mouth once. Use as directed for colonoscopy 9/6    . propranolol (INDERAL) 20 MG tablet Take 1 tablet (20 mg)  2 times a week and /or as needed for social or performance anxiety 30 tablet 1  . urea (CARMOL) 40 % CREA Apply 1 application topically daily. 1 each 0   Current Facility-Administered Medications  Medication Dose Route Frequency Provider Last Rate Last Dose  . 0.9 %  sodium chloride infusion  500 mL Intravenous Continuous Pyrtle, Lajuan Lines, MD        Allergies as of 12/27/2018 - Review Complete 05/24/2018  Allergen Reaction Noted  . Morphine and related Nausea And Vomiting 06/27/2013    Vitals: LMP  (LMP Unknown) Comment: pt. states that it has been  over 1 year Last Weight:  Wt Readings from Last 1 Encounters:  07/26/17 223 lb 3.2 oz (101.2 kg)   Last Height:   Ht Readings from Last 1 Encounters:  01/16/18 5\' 5"  (1.651 m)     Physical exam: Exam: Gen: NAD, conversant, well nourised, obese, well groomed                     CV: RRR, no MRG. No Carotid Bruits. No peripheral edema, warm, nontender Eyes: Conjunctivae clear without exudates or hemorrhage  Neuro: Detailed Neurologic Exam  Speech:    Speech is normal; fluent and spontaneous with normal comprehension.  Cognition:    The patient is oriented to person, place, and time;     recent and remote memory intact;     language fluent;     normal attention, concentration,     fund of knowledge Cranial Nerves:    The pupils are equal, round, and reactive to light. The fundi are normal and spontaneous venous pulsations are present. Visual fields are full to finger confrontation. Extraocular movements are intact. Trigeminal sensation is intact and the muscles of mastication are normal. The face is symmetric. The palate elevates in the midline. Hearing intact. Voice is normal. Shoulder shrug is normal. The tongue has normal motion without fasciculations.   Coordination:    Normal finger to nose and heel to shin. Normal rapid alternating movements.   Gait:    Heel-toe and tandem gait are normal.   Motor Observation:    No asymmetry, no atrophy, and no involuntary movements noted. Tone:    Normal muscle tone.    Posture:    Posture is normal. normal erect    Strength:    Strength is V/V in the upper and lower limbs.      Sensation: intact to LT     Reflex Exam:  DTR's:  Deep tendon reflexes in the upper and lower extremities are normal bilaterally.   Toes:    The toes are downgoing bilaterally.   Clonus:    Clonus is absent.    Assessment/Plan:    No orders of the defined types were placed in this encounter.  No orders of the defined types were placed in  this encounter.   Cc: Charlott Rakes, MD,  Charlott Rakes, MD  Sarina Ill, MD  [facility] [address] [place], [place] [address]  Phone [phone] Fax [phone]

## 2018-12-27 ENCOUNTER — Telehealth: Payer: Self-pay | Admitting: *Deleted

## 2018-12-27 ENCOUNTER — Ambulatory Visit: Payer: 59 | Admitting: Neurology

## 2018-12-27 NOTE — Telephone Encounter (Signed)
Pt no showed new pt appt today. 

## 2018-12-28 ENCOUNTER — Encounter: Payer: Self-pay | Admitting: Neurology

## 2019-01-16 ENCOUNTER — Other Ambulatory Visit: Payer: Self-pay

## 2019-01-16 ENCOUNTER — Ambulatory Visit (HOSPITAL_COMMUNITY)
Admission: EM | Admit: 2019-01-16 | Discharge: 2019-01-16 | Disposition: A | Discharge status: Home / Self Care | Payer: 59 | Attending: Family Medicine | Admitting: Family Medicine

## 2019-01-16 ENCOUNTER — Encounter (HOSPITAL_COMMUNITY): Payer: Self-pay | Admitting: Emergency Medicine

## 2019-01-16 DIAGNOSIS — J111 Influenza due to unidentified influenza virus with other respiratory manifestations: Secondary | ICD-10-CM

## 2019-01-16 DIAGNOSIS — R69 Illness, unspecified: Secondary | ICD-10-CM | POA: Diagnosis not present

## 2019-01-16 LAB — POCT RAPID STREP A: Streptococcus, Group A Screen (Direct): NEGATIVE

## 2019-01-16 MED ORDER — OSELTAMIVIR PHOSPHATE 75 MG PO CAPS: 75.0000 mg | ORAL_CAPSULE | Freq: Two times a day (BID) | ORAL | 0 refills | Status: DC

## 2019-01-16 MED ORDER — ACETAMINOPHEN 325 MG PO TABS
ORAL_TABLET | ORAL | Status: AC
  Filled 2019-01-16: qty 2

## 2019-01-16 MED ORDER — IBUPROFEN 800 MG PO TABS: 800.0000 mg | ORAL_TABLET | Freq: Three times a day (TID) | ORAL | 0 refills | Status: DC

## 2019-01-16 MED ORDER — ACETAMINOPHEN 325 MG PO TABS
650.0000 mg | ORAL_TABLET | Freq: Once | ORAL | Status: AC
  Administered 2019-01-16: 650 mg via ORAL

## 2019-01-16 NOTE — ED Triage Notes (Signed)
Pt here with body aches and fever

## 2019-01-16 NOTE — Discharge Instructions (Addendum)
We are treating you for flulike illness Tamiflu twice a day for 5 days Ibuprofen for body aches, fever You  can do over-the-counter Mucinex for cough and congestion We are unsure if this is a influenza illness or COVID illness I would like for you to quarantine yourself and then we will call  you in 3 days to see how you are doing. Your fever could last 3 days but you may be contagious for at least 7 days.

## 2019-01-16 NOTE — ED Provider Notes (Signed)
[facility]    CSN: [id number] Arrival date & time: 01/16/19  1413     History   Chief Complaint Chief Complaint  Patient presents with  . Generalized Body Aches    HPI Scotti Motter is a 58 y.o. female.   Pt is a 58 year old female that presents with sudden onset of fever, body aches, sore throat, dry cough. This has been present and worsening since last night. She was at work when this started. She works in [employer]. She has not taken anything for her symptoms. No recent traveling and no known sick contacts. Denies any chest pain, SOB.   ROS per HPI      Past Medical History:  Diagnosis Date  . Allergy   . Anemia   . Anxiety   . Arthritis   . Colon polyps    x 3  . Gastric ulcer   . GERD (gastroesophageal reflux disease)   . Hyperlipidemia   . Hypertension   . Thyroid disease     Patient Active Problem List   Diagnosis Date Noted  . Carpal tunnel syndrome 04/25/2017  . Cervicalgia 07/30/2016  . Synovitis of shoulder 05/21/2016  . Bilateral shoulder pain 03/08/2016  . Pure hypercholesterolemia 03/03/2016  . Costochondritis 03/01/2016  . Generalized anxiety disorder 03/01/2016  . De Quervain's tenosynovitis, bilateral 07/23/2015  . Neutropenia ([place]) 07/11/2015  . GERD (gastroesophageal reflux disease) 10/30/2013  . Endometrial mass 10/04/2013  . Leukocytopenia, unspecified 09/24/2013  . Dehydration 06/28/2013  . Anemia, unspecified 06/28/2013  . Acute low back pain due to trauma 06/28/2013  . Hypothyroidism 06/27/2013  . Syncope and collapse 06/27/2013  . Arthritis 06/27/2013  . Acute renal failure ([place]) 06/27/2013  . Essential hypertension, benign 06/27/2013  . 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 Chronic lower back pain 06/27/2013  . Hypokalemia 06/27/2013  . Hypertension 06/27/2013    Past Surgical History:  Procedure Laterality Date  . COLONOSCOPY    . GASTRIC BYPASS    . POLYPECTOMY      OB History   No obstetric  history on file.      Home Medications    Prior to Admission medications   Medication Sig Start Date End Date Taking? Authorizing Provider  APAP (TYLENOL) 325 MG tablet Take 2 tablets (650 mg total) by mouth every 12 (twelve) hours as needed for headache. 09/05/13   Thurnell Lose, MD  atorvastatin (LIPITOR) 20 MG tablet Take 1 tablet (20 mg total) by mouth daily. 07/26/17   Charlott Rakes, MD  bisacodyl (DULCOLAX) 5 MG EC tablet Take 5 mg by mouth daily as needed for moderate constipation. X 4 per prep instructions for colonoscopy on 9/6    [provider]  busPIRone (BUSPAR) 10 MG tablet TAKE 1 TABLET BY MOUTH TWICE DAILY FOR 90 DAYS 12/05/18   [provider]  busPIRone (BUSPAR) 7.5 MG tablet Take 1 tablet (7.5 mg total) by mouth 2 (two) times daily. 07/26/17   Charlott Rakes, MD  citalopram (CELEXA) 20 MG tablet Take 1 tablet (20 mg total) by mouth daily. 07/26/17   Charlott Rakes, MD  clonazePAM (KLONOPIN) 0.5 MG tablet  02/05/16   [provider]  cyclobenzaprine (FLEXERIL) 10 MG tablet TAKE 1 TABLET BY MOUTH 2 TIMES DAILY AS NEEDED FOR MUSCLE SPASMS. 12/08/17   Charlott Rakes, MD  dexlansoprazole (DEXILANT) 60 MG capsule Take 1 capsule (60 mg total) by mouth daily. 07/21/17   Pyrtle, Lajuan Lines, MD  diclofenac (FLECTOR) 1.3 % PTCH  Place 1 patch onto the skin 2 (two) times daily. 01/23/18   Trula Slade, DPM  diclofenac sodium (VOLTAREN) 1 % GEL Apply 4 g topically 4 (four) times daily. 07/26/17   Charlott Rakes, MD  diclofenac sodium (VOLTAREN) 1 % GEL Apply 4 g topically 4 (four) times daily. 01/20/18   Trula Slade, DPM  dicyclomine (BENTYL) 10 MG capsule Take 10 mg by mouth 3 (three) times daily. 09/18/18   [provider]  gabapentin (NEURONTIN) 100 MG capsule Take 1 capsule (100 mg total) by mouth at bedtime. 08/31/18   Trula Slade, DPM  gabapentin (NEURONTIN) 300 MG capsule Take 300 mg by mouth 2 (two) times daily. 12/08/18   [provider]  hydrochlorothiazide (HYDRODIURIL) 25 MG tablet Take 1 tablet (25 mg total) by mouth daily. 07/26/17   Charlott Rakes, MD  ibuprofen (ADVIL,MOTRIN) 800 MG tablet Take 1 tablet (800 mg total) by mouth 3 (three) times daily. 01/16/19   Claudia Alvizo, Tressia Miners A, NP  ipratropium (ATROVENT) 0.06 % nasal spray USE 2 SPRAY(S) IN EACH NOSTRIL TWICE DAILY AS NEEDED 08/02/18   [provider]  levothyroxine (SYNTHROID, LEVOTHROID) 150 MCG tablet TAKE 1 TABLET BY MOUTH DAILY BEFORE BREAKFAST. 07/27/17   Charlott Rakes, MD  linaclotide (LINZESS) 145 MCG CAPS capsule Take 1 capsule (145 mcg total) by mouth daily before breakfast. 07/18/17   Charlott Rakes, MD  losartan (COZAAR) 25 MG tablet Take 1 tablet (25 mg total) by mouth daily. 07/26/17   Charlott Rakes, MD  meclizine (ANTIVERT) 25 MG tablet Take 1 tablet (25 mg total) by mouth 3 (three) times daily as needed. 07/26/16   Charlott Rakes, MD  methylPREDNISolone (MEDROL DOSEPAK) 4 MG TBPK tablet Take as directed 01/16/18   Trula Slade, DPM  methylPREDNISolone (MEDROL DOSEPAK) 4 MG TBPK tablet Take as directed 04/06/18   Trula Slade, DPM  NONFORMULARY OR COMPOUNDED ITEM [facility]:  Antiinflammatory Cream - Diclofenac 3%, Baclofen 2%, Lidocaine 2%, apply 1-2 grams to affected area 3-4 times a day. 01/20/18   Trula Slade, DPM  ondansetron (ZOFRAN-ODT) 4 MG disintegrating tablet DISSOLVE 1 TO 2 TABLETS IN MOUTH EVERY 8 HOURS AS NEEDED FOR NAUSEA 09/18/18   [provider]  oseltamivir (TAMIFLU) 75 MG capsule Take 1 capsule (75 mg total) by mouth every 12 (twelve) hours. 01/16/19   Marieme Mcmackin, Tressia Miners A, NP  pantoprazole (PROTONIX) 40 MG tablet Take 40 mg by mouth daily. 12/05/18   [provider]  polyethylene glycol powder (MIRALAX) powder Take 1 Container by mouth once. Use as directed for colonoscopy 9/6    [provider]  propranolol (INDERAL) 20 MG tablet Take 1 tablet (20 mg)  2 times a week and /or as  needed for social or performance anxiety 07/26/17   Charlott Rakes, MD  urea (CARMOL) 40 % CREA Apply 1 application topically daily. 04/06/18   Trula Slade, DPM    Family History Family History  Problem Relation Age of Onset  . Diabetes Mother   . Diabetes Maternal Aunt        x 2  . Heart disease Sister   . Pancreatic cancer Sister   . Liver cancer Maternal Aunt   . Colon cancer Neg Hx   . Kidney disease Neg Hx   . Colon polyps Neg Hx   . Esophageal cancer Neg Hx   . Rectal cancer Neg Hx   . Stomach cancer Neg Hx     Social History Social  History   Tobacco Use  . Smoking status: Never Smoker  . Smokeless tobacco: Never Used  Substance Use Topics  . Alcohol use: Yes    Alcohol/week: 1.0 standard drinks    Types: 1 Glasses of wine per week    Comment: occasional  . Drug use: No     Allergies   Morphine and related   Review of Systems Review of Systems   Physical Exam Triage Vital Signs ED Triage Vitals [01/16/19 1427]  Enc Vitals Group     BP (!) 152/99     Pulse Rate (!) 121     Resp 18     Temp (!) 101.5 F (38.6 C)     Temp Source Oral     SpO2 96 %     Weight      Height      Head Circumference      Peak Flow      Pain Score 10     Pain Loc      Pain Edu?      Excl. in [place]?    No data found.  Updated Vital Signs BP (!) 152/99 (BP Location: Left Arm)   Pulse (!) 121   Temp (!) 101.5 F (38.6 C) (Oral)   Resp 18   LMP  (LMP Unknown) Comment: pt. states that it has been over 1 year  SpO2 96%   Visual Acuity Right Eye Distance:   Left Eye Distance:   Bilateral Distance:    Right Eye Near:   Left Eye Near:    Bilateral Near:     Physical Exam Vitals signs and nursing note reviewed.  Constitutional:      General: She is not in acute distress.    Appearance: Normal appearance. She is ill-appearing. She is not toxic-appearing or diaphoretic.  HENT:     Head: Normocephalic and atraumatic.     Right Ear: Tympanic membrane and  ear canal normal.     Left Ear: Tympanic membrane and ear canal normal.     Nose: Nose normal.     Mouth/Throat:     Pharynx: Posterior oropharyngeal erythema present.  Eyes:     Conjunctiva/sclera: Conjunctivae normal.  Neck:     Musculoskeletal: Normal range of motion.  Cardiovascular:     Rate and Rhythm: Normal rate and regular rhythm.     Pulses: Normal pulses.  Pulmonary:     Effort: Pulmonary effort is normal.     Breath sounds: Normal breath sounds.  Musculoskeletal: Normal range of motion.  Lymphadenopathy:     Cervical: No cervical adenopathy.  Skin:    General: Skin is warm and dry.  Neurological:     Mental Status: She is alert.  Psychiatric:        Mood and Affect: Mood normal.      UC Treatments / Results  Labs (all labs ordered are listed, but only abnormal results are displayed) Labs Reviewed  CULTURE, GROUP A STREP [facility])  POCT RAPID STREP A    EKG None  Radiology No results found.  Procedures Procedures (including critical care time)  Medications Ordered in UC Medications  acetaminophen (TYLENOL) tablet 650 mg (650 mg Oral Given 01/16/19 1432)    Initial Impression / Assessment and Plan / UC Course  I have reviewed the triage vital signs and the nursing notes.  Pertinent labs & imaging results that were available during my care of the patient were reviewed by me and considered in my medical  decision making (see chart for details).     Pt here with flu like symptoms. Unsure if this is COVID or influenza Precautions were taken Went ahead and prescribed tamiflu ibuprofen for aches and fever Mucinex for cough and congestion Pt told to go home and quarantine and we will call her in a few days to check on her.  If her symptoms worsen and she becomes SOB she needs to go to the ER.  Pt agrees  Final Clinical Impressions(s) / UC Diagnoses   Final diagnoses:  Influenza-like illness     Discharge Instructions     We are treating you  for flulike illness Tamiflu twice a day for 5 days Ibuprofen for body aches, fever You  can do over-the-counter Mucinex for cough and congestion We are unsure if this is a influenza illness or COVID illness I would like for you to quarantine yourself and then we will call  you in 3 days to see how you are doing. Your fever could last 3 days but you may be contagious for at least 7 days.        ED Prescriptions    Medication Sig Dispense Auth. Provider   oseltamivir (TAMIFLU) 75 MG capsule Take 1 capsule (75 mg total) by mouth every 12 (twelve) hours. 10 capsule Kailana Benninger A, NP   ibuprofen (ADVIL,MOTRIN) 800 MG tablet Take 1 tablet (800 mg total) by mouth 3 (three) times daily. 21 tablet Loura Halt A, NP     Controlled Substance Prescriptions [place] Controlled Substance Registry consulted? Not Applicable   Orvan July, NP 01/18/19 [phone]

## 2019-01-19 LAB — CULTURE, GROUP A STREP (THRC)

## 2019-01-22 ENCOUNTER — Telehealth (HOSPITAL_COMMUNITY): Payer: Self-pay | Admitting: Emergency Medicine

## 2019-01-22 NOTE — Telephone Encounter (Signed)
Contacted patient to see how she was feeling. States she is much better.

## 2019-03-22 DIAGNOSIS — Z7689 Persons encountering health services in other specified circumstances: Secondary | ICD-10-CM | POA: Diagnosis not present

## 2019-03-22 DIAGNOSIS — D649 Anemia, unspecified: Secondary | ICD-10-CM | POA: Diagnosis not present

## 2019-03-22 DIAGNOSIS — E7800 Pure hypercholesterolemia, unspecified: Secondary | ICD-10-CM | POA: Diagnosis not present

## 2019-03-22 DIAGNOSIS — E039 Hypothyroidism, unspecified: Secondary | ICD-10-CM | POA: Diagnosis not present

## 2019-03-22 DIAGNOSIS — I10 Essential (primary) hypertension: Secondary | ICD-10-CM | POA: Diagnosis not present

## 2019-03-22 MED FILL — LEVOTHYROXINE 150 MCG TAB: 150 | 30 days supply | Qty: 30 | Fill #0

## 2019-03-22 MED FILL — CITALOPRAM HBR 20 MG TABLET: 20 | 30 days supply | Qty: 30 | Fill #0

## 2019-03-22 MED FILL — DICYCLOMINE 10 MG CAPSULE: 10 | 30 days supply | Qty: 30 | Fill #0

## 2019-03-22 MED FILL — GABAPENTIN 100 MG CAPSULE: 100 | 30 days supply | Qty: 30 | Fill #0

## 2019-03-22 MED FILL — PANTOPRAZOLE SOD DR 40 MG T: 40 | 30 days supply | Qty: 30 | Fill #0

## 2019-03-22 MED FILL — CYCLOBENZAPRINE 10 MG TAB: 10 | 30 days supply | Qty: 60 | Fill #0

## 2019-03-22 MED FILL — ATORVASTATIN 40 MG TABLET: 40 | 30 days supply | Qty: 30 | Fill #0

## 2019-03-22 MED FILL — HYDROCHLOROTHIAZIDE 25 MG T: 25 | 30 days supply | Qty: 30 | Fill #0

## 2019-03-22 MED FILL — LOSARTAN POTASSIUM 25 MG TA: 25 | 30 days supply | Qty: 30 | Fill #0

## 2019-04-19 DIAGNOSIS — E7800 Pure hypercholesterolemia, unspecified: Secondary | ICD-10-CM | POA: Diagnosis not present

## 2019-04-19 DIAGNOSIS — I10 Essential (primary) hypertension: Secondary | ICD-10-CM | POA: Diagnosis not present

## 2019-04-19 DIAGNOSIS — E039 Hypothyroidism, unspecified: Secondary | ICD-10-CM | POA: Diagnosis not present

## 2019-04-19 DIAGNOSIS — D649 Anemia, unspecified: Secondary | ICD-10-CM | POA: Diagnosis not present

## 2019-04-19 MED FILL — ZOLPIDEM TARTRATE 5 MG TAB: 5 | 30 days supply | Qty: 30 | Fill #0

## 2019-05-25 ENCOUNTER — Ambulatory Visit (INDEPENDENT_AMBULATORY_CARE_PROVIDER_SITE_OTHER): Payer: 59

## 2019-05-25 ENCOUNTER — Encounter: Payer: Self-pay | Admitting: Orthopaedic Surgery

## 2019-05-25 ENCOUNTER — Other Ambulatory Visit: Payer: Self-pay

## 2019-05-25 ENCOUNTER — Ambulatory Visit (INDEPENDENT_AMBULATORY_CARE_PROVIDER_SITE_OTHER): Payer: 59 | Admitting: Orthopaedic Surgery

## 2019-05-25 DIAGNOSIS — M25561 Pain in right knee: Secondary | ICD-10-CM

## 2019-05-25 DIAGNOSIS — M25562 Pain in left knee: Secondary | ICD-10-CM

## 2019-05-25 NOTE — Progress Notes (Signed)
Office Visit Note   Patient: Julie Miranda           Date of Birth: 11-Dec-1960           MRN: [id number] Visit Date: 05/25/2019              Requested by: No referring provider defined for this encounter. PCP: Patient, No Pcp Per   Assessment & Plan: Visit Diagnoses:  1. Left knee pain, unspecified chronicity   2. Right knee pain, unspecified chronicity     Plan: Impression is bilateral knee osteoarthritis.  We will inject both knees with cortisone today.  Because she is having pain at times to the groin, if she does not get significant relief from her knee injection she will follow-up with [place] to further assess her hips.  Otherwise, follow-up as needed.  Follow-Up Instructions: Return if symptoms worsen or fail to improve.   Orders:  Orders Placed This Encounter  Procedures  . Large Joint Inj: bilateral knee  . XR Knee Complete 4 Views Right  . XR Knee Complete 4 Views Left   No orders of the defined types were placed in this encounter.     Procedures: Large Joint Inj: bilateral knee on 05/25/2019 8:57 AM Indications: pain Details: 22 G needle, anterolateral approach Medications (Right): 0.66 mL bupivacaine 0.25 %; 3 mL lidocaine 1 %; 13.33 mg methylPREDNISolone acetate 40 MG/ML Medications (Left): 0.66 mL bupivacaine 0.25 %; 3 mL lidocaine 1 %; 13.33 mg methylPREDNISolone acetate 40 MG/ML      Clinical Data: No additional findings.   Subjective: Chief Complaint  Patient presents with  . Left Knee - Pain  . Right Knee - Pain    HPI patient is a pleasant 58 year old female who presents our clinic today with bilateral knee pain left greater than right.  She has had years of intermittent knee pain without any specific injury.  Her pain has recently worsened and has become more constant in nature.  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  The pain she has to the entire knee but does radiate down to the lower  leg and occasionally into the groin and lateral hips.  She notes popping sensations to the knees at times.  Stairs and kneeling on her knees seem to make her pain worse.  She has tried aspirin and other OTC medications without significant relief of symptoms.  No previous hip or knee injections.  Review of Systems as detailed in HPI.  All others reviewed and are negative.   Objective: Vital Signs: LMP  (LMP Unknown) Comment: pt. states that it has been over 1 year  Physical Exam well-developed well-nourished female no acute distress.  Alert and oriented x3.  Ortho Exam examination of both knees reveals no effusion.  Range of motion 0 to 110 degrees.  Medial and lateral joint line tenderness both sides.  Mild patellofemoral crepitus.  Ligaments are stable.  She does have pain in the groin with the extremes of internal rotation right greater than left.  Negative straight leg raise.  No focal weakness.  She is neurovascularly intact distally.  Specialty Comments:  No specialty comments available.  Imaging: Xr Knee Complete 4 Views Left  Result Date: 05/25/2019 Decreased joint space medial compartment  Xr Knee Complete 4 Views Right  Result Date: 05/25/2019 Decreased joint space medial compartment    PMFS History: Patient Active Problem List  Diagnosis Date Noted  . Carpal tunnel syndrome 04/25/2017  . Cervicalgia 07/30/2016  . Synovitis of shoulder 05/21/2016  . Bilateral shoulder pain 03/08/2016  . Pure hypercholesterolemia 03/03/2016  . Costochondritis 03/01/2016  . Generalized anxiety disorder 03/01/2016  . De Quervain's tenosynovitis, bilateral 07/23/2015  . Neutropenia ([place]) 07/11/2015  . GERD (gastroesophageal reflux disease) 10/30/2013  . Endometrial mass 10/04/2013  . Leukocytopenia, unspecified 09/24/2013  . Dehydration 06/28/2013  . Anemia, unspecified 06/28/2013  . Acute low back pain due to trauma 06/28/2013  . Hypothyroidism 06/27/2013  . Syncope and collapse  06/27/2013  . Arthritis 06/27/2013  . Acute renal failure ([place]) 06/27/2013  . Essential hypertension, benign 06/27/2013  . 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 Chronic lower back pain 06/27/2013  . Hypokalemia 06/27/2013  . Hypertension 06/27/2013   Past Medical History:  Diagnosis Date  . Allergy   . Anemia   . Anxiety   . Arthritis   . Colon polyps    x 3  . Gastric ulcer   . GERD (gastroesophageal reflux disease)   . Hyperlipidemia   . Hypertension   . Thyroid disease     Family History  Problem Relation Age of Onset  . Diabetes Mother   . Diabetes Maternal Aunt        x 2  . Heart disease Sister   . Pancreatic cancer Sister   . Liver cancer Maternal Aunt   . Colon cancer Neg Hx   . Kidney disease Neg Hx   . Colon polyps Neg Hx   . Esophageal cancer Neg Hx   . Rectal cancer Neg Hx   . Stomach cancer Neg Hx     Past Surgical History:  Procedure Laterality Date  . COLONOSCOPY    . GASTRIC BYPASS    . POLYPECTOMY     Social History   Occupational History  . Not on file  Tobacco Use  . Smoking status: Never Smoker  . Smokeless tobacco: Never Used  Substance and Sexual Activity  . Alcohol use: Yes    Alcohol/week: 1.0 standard drinks    Types: 1 Glasses of wine per week    Comment: occasional  . Drug use: No  . Sexual activity: Not Currently    Birth control/protection: Injection

## 2019-05-26 MED ORDER — METHYLPREDNISOLONE ACETATE 40 MG/ML IJ SUSP
13.3300 mg | INTRAMUSCULAR | Status: AC | PRN
  Administered 2019-05-25: 13.33 mg via INTRA_ARTICULAR

## 2019-05-26 MED ORDER — BUPIVACAINE HCL 0.25 % IJ SOLN
0.6600 mL | INTRAMUSCULAR | Status: AC | PRN
  Administered 2019-05-25: .66 mL via INTRA_ARTICULAR

## 2019-05-26 MED ORDER — LIDOCAINE HCL 1 % IJ SOLN
3.0000 mL | INTRAMUSCULAR | Status: AC | PRN
  Administered 2019-05-25: 3 mL

## 2019-05-28 ENCOUNTER — Telehealth: Payer: Self-pay | Admitting: Orthopaedic Surgery

## 2019-05-28 NOTE — Telephone Encounter (Signed)
Pt called in said lindsay told her to call in if she wasn't feeling better after the injections in bilateral knee's. Pt says yesterday 05-27-2019 when she was at work she was just walking and her right knee popped and has had severe pain ever since and her left knee is starting to have pain again too, pt is wondering what should she do now?   [phone]

## 2019-05-28 NOTE — Telephone Encounter (Signed)
Please advise 

## 2019-05-28 NOTE — Telephone Encounter (Signed)
I left voicemail for patient advising. 

## 2019-05-28 NOTE — Telephone Encounter (Signed)
Give it two weeks after shot and if still not better, let us know.  Reiterate that it can take 2 weeks to kick in

## 2019-05-31 MED FILL — LEVOTHYROXINE 150 MCG TAB: 150 | 30 days supply | Qty: 30 | Fill #1

## 2019-05-31 MED FILL — HYDROCHLOROTHIAZIDE 25 MG T: 25 | 30 days supply | Qty: 30 | Fill #1

## 2019-05-31 MED FILL — LOSARTAN POTASSIUM 25 MG TA: 25 | 30 days supply | Qty: 30 | Fill #1

## 2019-05-31 MED FILL — ATORVASTATIN 40 MG TABLET: 40 | 30 days supply | Qty: 30 | Fill #1

## 2019-05-31 MED FILL — GABAPENTIN 100 MG CAPSULE: 100 | 30 days supply | Qty: 30 | Fill #1

## 2019-05-31 MED FILL — PANTOPRAZOLE SOD DR 40 MG T: 40 | 30 days supply | Qty: 30 | Fill #1

## 2019-05-31 MED FILL — CITALOPRAM HBR 20 MG TABLET: 20 | 30 days supply | Qty: 30 | Fill #1

## 2019-06-01 ENCOUNTER — Ambulatory Visit (INDEPENDENT_AMBULATORY_CARE_PROVIDER_SITE_OTHER): Payer: 59

## 2019-06-01 ENCOUNTER — Ambulatory Visit (INDEPENDENT_AMBULATORY_CARE_PROVIDER_SITE_OTHER): Payer: 59 | Admitting: Orthopaedic Surgery

## 2019-06-01 ENCOUNTER — Encounter: Payer: Self-pay | Admitting: Physician Assistant

## 2019-06-01 VITALS — Ht 66.0 in | Wt 206.0 lb

## 2019-06-01 DIAGNOSIS — M25562 Pain in left knee: Secondary | ICD-10-CM | POA: Diagnosis not present

## 2019-06-01 DIAGNOSIS — E039 Hypothyroidism, unspecified: Secondary | ICD-10-CM | POA: Diagnosis not present

## 2019-06-01 DIAGNOSIS — G8929 Other chronic pain: Secondary | ICD-10-CM

## 2019-06-01 DIAGNOSIS — I10 Essential (primary) hypertension: Secondary | ICD-10-CM | POA: Diagnosis not present

## 2019-06-01 DIAGNOSIS — M1712 Unilateral primary osteoarthritis, left knee: Secondary | ICD-10-CM | POA: Diagnosis not present

## 2019-06-01 DIAGNOSIS — E7800 Pure hypercholesterolemia, unspecified: Secondary | ICD-10-CM | POA: Diagnosis not present

## 2019-06-01 DIAGNOSIS — D649 Anemia, unspecified: Secondary | ICD-10-CM | POA: Diagnosis not present

## 2019-06-01 MED ORDER — ACETAMINOPHEN-CODEINE #3 300-30 MG PO TABS: 1.0000 | ORAL_TABLET | Freq: Four times a day (QID) | ORAL | 1 refills | Status: DC | PRN

## 2019-06-01 MED FILL — ACETAMINOPHEN/COD #3 TABLET: 300-30 | 7 days supply | Qty: 30 | Fill #0

## 2019-06-01 MED FILL — CYCLOBENZAPRINE 10 MG TAB: 10 | 30 days supply | Qty: 60 | Fill #0

## 2019-06-01 MED FILL — ZOLPIDEM TARTRATE 5 MG TAB: 5 | 30 days supply | Qty: 30 | Fill #0

## 2019-06-01 NOTE — Progress Notes (Addendum)
Office Visit Note   Patient: Julie Miranda           Date of Birth: 03/26/1961           MRN: [id number] Visit Date: 06/01/2019              Requested by: No referring provider defined for this encounter. PCP: Patient, No Pcp Per   Assessment & Plan: Visit Diagnoses:  1. Primary osteoarthritis of left knee   2. Chronic pain of left knee     Plan: Impression is left knee stress fracture versus degenerative medial meniscus tear.  We will obtain an MRI to further assess this.  We will also refer the patient to [facility] for a medial compartment DJD unloader brace.  She has asked for pain medication and I have agreed to call in Tylenol 3 but nothing stronger.  She will follow-up with [place] after she has had her MRI..  Follow-Up Instructions: Return for after MRI.   Orders:  Orders Placed This Encounter  Procedures  . XR KNEE 3 VIEW LEFT  . MR Knee Left w/o contrast   Meds ordered this encounter  Medications  . acetaminophen-codeine (TYLENOL #3) 300-30 MG tablet    Sig: Take 1 tablet by mouth every 6 (six) hours as needed for moderate pain.    Dispense:  30 tablet    Refill:  1      Procedures: No procedures performed   Clinical Data: No additional findings.   Subjective: Chief Complaint  Patient presents with  . Right Knee - Pain  . Left Knee - Pain    HPI patient is a pleasant 58 year old female who presents our clinic today with recurrent left knee pain.  I saw her 1 week ago with bilateral knee pain which had recently worsened after a few years of intermittent pain.  Both knees were injected with cortisone.  She had significant relief of symptoms to the right knee.  Left knee was significantly better as well, but but only lasted for about a day.  She was at work the following day when she reached up and felt a popping sensation to the anteromedial aspect.  This is the same sort of popping sensation she described at her last visit.  Otherwise, no new injury.  The pain  she is having to the left knee is anteromedial.  This is a sharp pain which is causing her to ambulate with an altered gait.  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  She is requesting another cortisone injection today.  She does note that she leaves for [place] next Friday and does not come back till 1 September.  Review of Systems as detailed in HPI.  All others reviewed and are negative.   Objective: Vital Signs: Ht 5\' 6"  (1.676 m)   Wt 206 lb (93.4 kg)   LMP  (LMP Unknown) Comment: pt. states that it has been over 1 year  BMI 33.25 kg/m   Physical Exam well-developed and well-nourished female in no acute distress.  Alert and oriented x3.  Ortho Exam examination of her left knee reveals range of motion from 0 to 110 degrees.  No effusion.  Marked tenderness medial joint line.  No tenderness lateral joint line.  She is stable to valgus varus stress.  She is neurovascular intact distally.  Specialty Comments:  No specialty comments available.  Imaging: No  results found.   PMFS History: Patient Active Problem List   Diagnosis Date Noted  . Primary osteoarthritis of left knee 06/05/2019  . Chronic pain of left knee 06/05/2019  . Carpal tunnel syndrome 04/25/2017  . Cervicalgia 07/30/2016  . Synovitis of shoulder 05/21/2016  . Bilateral shoulder pain 03/08/2016  . Pure hypercholesterolemia 03/03/2016  . Costochondritis 03/01/2016  . Generalized anxiety disorder 03/01/2016  . De Quervain's tenosynovitis, bilateral 07/23/2015  . Neutropenia ([place]) 07/11/2015  . GERD (gastroesophageal reflux disease) 10/30/2013  . Endometrial mass 10/04/2013  . Leukocytopenia, unspecified 09/24/2013  . Dehydration 06/28/2013  . Anemia, unspecified 06/28/2013  . Acute low back pain due to trauma 06/28/2013  . Hypothyroidism 06/27/2013  . Syncope and collapse 06/27/2013  . Arthritis 06/27/2013  . Acute renal failure  ([place]) 06/27/2013  . Essential hypertension, benign 06/27/2013  . 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 Chronic lower back pain 06/27/2013  . Hypokalemia 06/27/2013  . Hypertension 06/27/2013   Past Medical History:  Diagnosis Date  . Allergy   . Anemia   . Anxiety   . Arthritis   . Colon polyps    x 3  . Gastric ulcer   . GERD (gastroesophageal reflux disease)   . Hyperlipidemia   . Hypertension   . Thyroid disease     Family History  Problem Relation Age of Onset  . Diabetes Mother   . Diabetes Maternal Aunt        x 2  . Heart disease Sister   . Pancreatic cancer Sister   . Liver cancer Maternal Aunt   . Colon cancer Neg Hx   . Kidney disease Neg Hx   . Colon polyps Neg Hx   . Esophageal cancer Neg Hx   . Rectal cancer Neg Hx   . Stomach cancer Neg Hx     Past Surgical History:  Procedure Laterality Date  . COLONOSCOPY    . GASTRIC BYPASS    . POLYPECTOMY     Social History   Occupational History  . Not on file  Tobacco Use  . Smoking status: Never Smoker  . Smokeless tobacco: Never Used  Substance and Sexual Activity  . Alcohol use: Yes    Alcohol/week: 1.0 standard drinks    Types: 1 Glasses of wine per week    Comment: occasional  . Drug use: No  . Sexual activity: Not Currently    Birth control/protection: Injection

## 2019-06-05 DIAGNOSIS — G8929 Other chronic pain: Secondary | ICD-10-CM

## 2019-06-05 DIAGNOSIS — M1712 Unilateral primary osteoarthritis, left knee: Secondary | ICD-10-CM | POA: Insufficient documentation

## 2019-06-05 DIAGNOSIS — M25562 Pain in left knee: Secondary | ICD-10-CM | POA: Insufficient documentation

## 2019-06-05 HISTORY — DX: Other chronic pain: G89.29

## 2019-06-05 HISTORY — DX: Unilateral primary osteoarthritis, left knee: M17.12

## 2019-06-09 DIAGNOSIS — M1712 Unilateral primary osteoarthritis, left knee: Secondary | ICD-10-CM | POA: Diagnosis not present

## 2019-06-09 DIAGNOSIS — M25562 Pain in left knee: Secondary | ICD-10-CM | POA: Diagnosis not present

## 2019-06-11 ENCOUNTER — Telehealth: Payer: Self-pay | Admitting: Orthopaedic Surgery

## 2019-06-11 NOTE — Telephone Encounter (Signed)
Returned call to patient left message to call back. 

## 2019-07-04 ENCOUNTER — Other Ambulatory Visit: Payer: Self-pay

## 2019-07-04 ENCOUNTER — Ambulatory Visit
Admission: RE | Admit: 2019-07-04 | Discharge: 2019-07-04 | Disposition: A | Discharge status: Home / Self Care | Payer: 59 | Source: Ambulatory Visit | Attending: Physician Assistant | Admitting: Physician Assistant

## 2019-07-04 DIAGNOSIS — G8929 Other chronic pain: Secondary | ICD-10-CM

## 2019-07-04 DIAGNOSIS — M23322 Other meniscus derangements, posterior horn of medial meniscus, left knee: Secondary | ICD-10-CM | POA: Diagnosis not present

## 2019-07-04 NOTE — Progress Notes (Signed)
Can you have her f/u to discuss

## 2019-07-06 ENCOUNTER — Ambulatory Visit (INDEPENDENT_AMBULATORY_CARE_PROVIDER_SITE_OTHER): Payer: 59 | Admitting: Orthopaedic Surgery

## 2019-07-06 ENCOUNTER — Encounter: Payer: Self-pay | Admitting: Orthopaedic Surgery

## 2019-07-06 VITALS — Ht 66.0 in | Wt 206.0 lb

## 2019-07-06 DIAGNOSIS — M1712 Unilateral primary osteoarthritis, left knee: Secondary | ICD-10-CM

## 2019-07-06 NOTE — Progress Notes (Signed)
Office Visit Note   Patient: Julie Miranda           Date of Birth: 11-24-1960           MRN: [id number] Visit Date: 07/06/2019              Requested by: No referring provider defined for this encounter. PCP: Patient, No Pcp Per   Assessment & Plan: Visit Diagnoses:  1. Primary osteoarthritis of left knee     Plan: MRI findings are consistent with a meniscal root tear of the medial meniscus with extrusion.  She also has moderate tricompartmental DJD with medial compartment joint space narrowing.  She has intense edema of the medial femoral condyle as well as an LCD lesion.  These findings were discussed with the patient in detail and then we discussed arthroscopic meniscal debridement and sub-chondroplasty versus knee replacement has a more definitive solution to her DJD.  We discussed the pros and cons of each option as well as risks and potential complications.  I think she would be a good candidate for an uncemented knee replacement given her age and bone quality.  She will call us back when she is ready to schedule surgery.  She is likely leaning towards a knee replacement.  She denies a history of DVT or allergy to aspirin or nickel. Total face to face encounter time was greater than 25 minutes and over half of this time was spent in counseling and/or coordination of care.  Follow-Up Instructions: Return if symptoms worsen or fail to improve.   Orders:  No orders of the defined types were placed in this encounter.  No orders of the defined types were placed in this encounter.     Procedures: No procedures performed   Clinical Data: No additional findings.   Subjective: Chief Complaint  Patient presents with  . Left Knee - Follow-up    MRI results of left knee     Julie Miranda is here to review her left knee MRI.  She has been wearing unloader brace.  She states that she has a lot of pain she cannot walk without the brace.  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   Review of Systems   Objective: Vital Signs: Ht 5\' 6"  (1.676 m)   Wt 206 lb (93.4 kg)   LMP  (LMP Unknown) Comment: pt. states that it has been over 1 year  BMI 33.25 kg/m   Physical Exam  Ortho Exam Left knee exam is unchanged. Specialty Comments:  No specialty comments available.  Imaging: No results found.   PMFS History: Patient Active Problem List   Diagnosis Date Noted  . Primary osteoarthritis of left knee 06/05/2019  . Chronic pain of left knee 06/05/2019  . Carpal tunnel syndrome 04/25/2017  . Cervicalgia 07/30/2016  . Synovitis of shoulder 05/21/2016  . Bilateral shoulder pain 03/08/2016  . Pure hypercholesterolemia 03/03/2016  . Costochondritis 03/01/2016  . Generalized anxiety disorder 03/01/2016  . De Quervain's tenosynovitis, bilateral 07/23/2015  . Neutropenia ([place]) 07/11/2015  . GERD (gastroesophageal reflux disease) 10/30/2013  . Endometrial mass 10/04/2013  . Leukocytopenia, unspecified 09/24/2013  . Dehydration 06/28/2013  . Anemia, unspecified 06/28/2013  . Acute low back pain due to trauma 06/28/2013  . Hypothyroidism 06/27/2013  . Syncope and collapse 06/27/2013  . Arthritis 06/27/2013  . Acute renal failure ([place]) 06/27/2013  . Essential hypertension, benign 06/27/2013  . 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 Chronic lower back pain 06/27/2013  .  Hypokalemia 06/27/2013  . Hypertension 06/27/2013   Past Medical History:  Diagnosis Date  . Allergy   . Anemia   . Anxiety   . Arthritis   . Colon polyps    x 3  . Gastric ulcer   . GERD (gastroesophageal reflux disease)   . Hyperlipidemia   . Hypertension   . Thyroid disease     Family History  Problem Relation Age of Onset  . Diabetes Mother   . Diabetes Maternal Aunt        x 2  . Heart disease Sister   . Pancreatic cancer Sister   . Liver cancer Maternal Aunt   . Colon cancer Neg Hx   . Kidney disease Neg Hx   . Colon polyps Neg Hx   . Esophageal cancer Neg  Hx   . Rectal cancer Neg Hx   . Stomach cancer Neg Hx     Past Surgical History:  Procedure Laterality Date  . COLONOSCOPY    . GASTRIC BYPASS    . POLYPECTOMY     Social History   Occupational History  . Not on file  Tobacco Use  . Smoking status: Never Smoker  . Smokeless tobacco: Never Used  Substance and Sexual Activity  . Alcohol use: Yes    Alcohol/week: 1.0 standard drinks    Types: 1 Glasses of wine per week    Comment: occasional  . Drug use: No  . Sexual activity: Not Currently    Birth control/protection: Injection

## 2019-07-06 NOTE — Progress Notes (Signed)
Patient was seen in our office 07/06/2019

## 2019-07-09 ENCOUNTER — Telehealth: Payer: Self-pay | Admitting: Orthopaedic Surgery

## 2019-07-09 NOTE — Telephone Encounter (Signed)
Patient is ready to schedule surgery

## 2019-07-09 NOTE — Telephone Encounter (Signed)
Patient called. She would like to have surgery. Her call back number is [phone]. Thanks

## 2019-07-10 NOTE — Telephone Encounter (Signed)
See message.

## 2019-07-11 NOTE — Telephone Encounter (Signed)
I called patient to schedule.

## 2019-07-13 ENCOUNTER — Telehealth: Payer: Self-pay | Admitting: Orthopaedic Surgery

## 2019-07-13 NOTE — Telephone Encounter (Signed)
Patient called. She needs a note stating her surgery date. She is in school and is doing virtual classes. Her call back number is [phone]

## 2019-07-13 NOTE — Telephone Encounter (Signed)
When is she having surgery?

## 2019-07-16 ENCOUNTER — Other Ambulatory Visit: Payer: Self-pay

## 2019-07-16 ENCOUNTER — Telehealth: Payer: Self-pay | Admitting: Orthopaedic Surgery

## 2019-07-16 NOTE — Telephone Encounter (Signed)
Patient called advised she is having surgery 07/30/2019 and her teacher need a letter stating date of surgery and how long she will be out after surgery. Please see previous notes.  Patient said she can pick the letter up Wednesday 07/18/2019. The number to contact patient is [phone]

## 2019-07-16 NOTE — Telephone Encounter (Signed)
Called pt and advised that letter is at the front desk for pick up whenever she has time.

## 2019-07-16 NOTE — Telephone Encounter (Signed)
Autumn completed note for patient today.

## 2019-07-16 NOTE — Telephone Encounter (Signed)
07/30/19 

## 2019-07-20 ENCOUNTER — Other Ambulatory Visit: Payer: Self-pay

## 2019-07-25 NOTE — Pre-Procedure Instructions (Signed)
[place], [place] - [address] [facility]. [address] [place] [address] Phone: [phone] Fax: [phone]    Your procedure is scheduled on Mon., Oct. 5, 2020 from 12:56PM-3:15PM  Report to [facility] Entrance "A" at 10:50AM  Call this number if you have problems the morning of surgery:  [phone]   Remember:  Do not eat after midnight on Oct. 4th  You may drink clear liquids until 3 hours (9:50AM) prior to surgery time.  Clear liquids allowed are:  Water, Juice (non-citric and without pulp), Carbonated beverages, Clear Tea, Black Coffee only, Plain Jell-O only, Gatorade and Plain Popsicles only   Enhanced Recovery after Surgery for Orthopedics Enhanced Recovery after Surgery is a protocol used to improve the stress on your body and your recovery after surgery.  Patient Instructions .  Marland Kitchen The day of surgery : Drink by (9:50AM)  o Drink ONE (1) Pre-Surgery Clear Ensure as directed.   o This drink was given to you during your hospital  pre-op appointment visit.  o Finish the drink at the designated time by the pre-op nurse.  o Nothing else to drink after completing the  Pre-Surgery Clear Ensure.         If you have questions, please contact your surgeon's office.    Take these medicines the morning of surgery with A SIP OF WATER: Atorvastatin (LIPITOR) BusPIRone (BUSPAR)  Citalopram (CELEXA)  Levothyroxine (SYNTHROID, LEVOTHROID)  Pantoprazole (PROTONIX)  If Needed: Ondansetron (ZOFRAN-ODT)  As of today, stop taking all Aspirin (unless instructed by your doctor) and Other Aspirin containing products, Vitamins, Fish oils, and Herbal medications. Also stop all NSAIDS i.e. Advil, Ibuprofen, Motrin, Aleve, Anaprox, Naproxen, BC, Goody Powders, and all Supplements.  No Tobacco or Alcohol products for 24 hours prior to your procedure.  Special instructions:  [facility]- Preparing For Surgery  Before surgery, you  can play an important role. Because skin is not sterile, your skin needs to be as free of germs as possible. You can reduce the number of germs on your skin by washing with CHG (chlorahexidine gluconate) Soap before surgery.  CHG is an antiseptic cleaner which kills germs and bonds with the skin to continue killing germs even after washing.    Please do not use if you have an allergy to CHG or antibacterial soaps. If your skin becomes reddened/irritated stop using the CHG.  Do not shave (including legs and underarms) for at least 48 hours prior to first CHG shower. It is OK to shave your face.  Please follow these instructions carefully.   1. Shower the NIGHT BEFORE SURGERY and the MORNING OF SURGERY with CHG.   2. If you chose to wash your hair, wash your hair first as usual with your normal shampoo.  3. After you shampoo, rinse your hair and body thoroughly to remove the shampoo.  4. Use CHG as you would any other liquid soap. You can apply CHG directly to the skin and wash gently with a scrungie or a clean washcloth.   5. Apply the CHG Soap to your body ONLY FROM THE NECK DOWN.  Do not use on open wounds or open sores. Avoid contact with your eyes, ears, mouth and genitals (private parts). Wash Face and genitals (private parts)  with your normal soap.  6. Wash thoroughly, paying special attention to the area where your surgery will be performed.  7. Thoroughly rinse your body with warm water from the  neck down.  8. DO NOT shower/wash with your normal soap after using and rinsing off the CHG Soap.  9. Pat yourself dry with a CLEAN TOWEL.  10. Wear CLEAN PAJAMAS to bed the night before surgery, wear comfortable clothes the morning of surgery  11. Place CLEAN SHEETS on your bed the night of your first shower and DO NOT SLEEP WITH PETS.   Day of Surgery:             Remember to brush your teeth WITH YOUR REGULAR TOOTHPASTE.  Do not wear jewelry, make-up or nail polish.  Do not wear  lotions, powders, or perfumes, or deodorant.  Do not shave 48 hours prior to surgery.    Do not bring valuables to the hospital.  [facility] is not responsible for any belongings or valuables.  Contacts, dentures or bridgework may not be worn into surgery.    For patients admitted to the hospital, discharge time will be determined by your treatment team.  Patients discharged the day of surgery will not be allowed to drive home, and must have someone the age of 20 or older with them for 24 hours.  Please wear clean clothes to the hospital/surgery center.    Please read over the following fact sheets that you were given. Pain Booklet, Coughing and Deep Breathing, Total Joint Packet, MRSA Information and Surgical Site Infection Prevention

## 2019-07-26 ENCOUNTER — Other Ambulatory Visit: Payer: Self-pay

## 2019-07-26 ENCOUNTER — Encounter (HOSPITAL_COMMUNITY)
Admission: RE | Admit: 2019-07-26 | Discharge: 2019-07-26 | Disposition: A | Discharge status: Home / Self Care | Payer: 59 | Source: Ambulatory Visit | Attending: Orthopaedic Surgery | Admitting: Orthopaedic Surgery

## 2019-07-26 ENCOUNTER — Other Ambulatory Visit (HOSPITAL_COMMUNITY): Payer: 59

## 2019-07-26 ENCOUNTER — Other Ambulatory Visit (HOSPITAL_COMMUNITY)
Admission: RE | Admit: 2019-07-26 | Discharge: 2019-07-26 | Disposition: A | Discharge status: Home / Self Care | Payer: 59 | Source: Ambulatory Visit | Attending: Orthopaedic Surgery | Admitting: Orthopaedic Surgery

## 2019-07-26 ENCOUNTER — Encounter (HOSPITAL_COMMUNITY)
Admission: RE | Admit: 2019-07-26 | Discharge: 2019-07-26 | Disposition: A | Discharge status: Home / Self Care | Payer: 59 | Source: Ambulatory Visit | Attending: Physician Assistant | Admitting: Physician Assistant

## 2019-07-26 ENCOUNTER — Encounter (HOSPITAL_COMMUNITY): Payer: Self-pay

## 2019-07-26 DIAGNOSIS — Z01818 Encounter for other preprocedural examination: Secondary | ICD-10-CM | POA: Insufficient documentation

## 2019-07-26 DIAGNOSIS — Z20828 Contact with and (suspected) exposure to other viral communicable diseases: Secondary | ICD-10-CM | POA: Insufficient documentation

## 2019-07-26 DIAGNOSIS — I10 Essential (primary) hypertension: Secondary | ICD-10-CM | POA: Insufficient documentation

## 2019-07-26 DIAGNOSIS — M1712 Unilateral primary osteoarthritis, left knee: Secondary | ICD-10-CM | POA: Insufficient documentation

## 2019-07-26 DIAGNOSIS — R9431 Abnormal electrocardiogram [ECG] [EKG]: Secondary | ICD-10-CM | POA: Insufficient documentation

## 2019-07-26 DIAGNOSIS — E785 Hyperlipidemia, unspecified: Secondary | ICD-10-CM | POA: Diagnosis not present

## 2019-07-26 HISTORY — DX: Headache, unspecified: R51.9

## 2019-07-26 HISTORY — DX: Thyrotoxicosis with diffuse goiter without thyrotoxic crisis or storm: E05.00

## 2019-07-26 HISTORY — DX: Family history of other specified conditions: Z84.89

## 2019-07-26 HISTORY — DX: Depression, unspecified: F32.A

## 2019-07-26 HISTORY — DX: Personal history of diseases of the blood and blood-forming organs and certain disorders involving the immune mechanism: Z86.2

## 2019-07-26 LAB — COMPREHENSIVE METABOLIC PANEL
ALT: 17 U/L (ref 0–44)
AST: 26 U/L (ref 15–41)
Albumin: 3.8 g/dL (ref 3.5–5.0)
Alkaline Phosphatase: 76 U/L (ref 38–126)
Anion gap: 11 (ref 5–15)
BUN: 11 mg/dL (ref 6–20)
CO2: 25 mmol/L (ref 22–32)
Calcium: 9.5 mg/dL (ref 8.9–10.3)
Chloride: 105 mmol/L (ref 98–111)
Creatinine, Ser: 0.84 mg/dL (ref 0.44–1.00)
GFR calc Af Amer: >60 mL/min (ref >60)
GFR calc non Af Amer: >60 mL/min (ref >60)
Glucose, Bld: 84 mg/dL (ref 70–99)
Potassium: 3.5 mmol/L (ref 3.5–5.1)
Sodium: 141 mmol/L (ref 135–145)
Total Bilirubin: 0.6 mg/dL (ref 0.3–1.2)
Total Protein: 6.8 g/dL (ref 6.5–8.1)

## 2019-07-26 LAB — TYPE AND SCREEN
ABO/RH(D): O POS
Antibody Screen: NEGATIVE

## 2019-07-26 LAB — CBC WITH DIFFERENTIAL/PLATELET
Abs Immature Granulocytes: 0 10*3/uL (ref 0.00–0.07)
Basophils Absolute: 0 10*3/uL (ref 0.0–0.1)
Basophils Relative: 1 %
Eosinophils Absolute: 0.2 10*3/uL (ref 0.0–0.5)
Eosinophils Relative: 5 %
HCT: 41 % (ref 36.0–46.0)
Hemoglobin: 11.8 g/dL — ABNORMAL LOW (ref 12.0–15.0)
Immature Granulocytes: 0 %
Lymphocytes Relative: 33 %
Lymphs Abs: 1.3 10*3/uL (ref 0.7–4.0)
MCH: 25.9 pg — ABNORMAL LOW (ref 26.0–34.0)
MCHC: 28.8 g/dL — ABNORMAL LOW (ref 30.0–36.0)
MCV: 90.1 fL (ref 80.0–100.0)
Monocytes Absolute: 0.3 10*3/uL (ref 0.1–1.0)
Monocytes Relative: 8 %
Neutro Abs: 2.1 10*3/uL (ref 1.7–7.7)
Neutrophils Relative %: 53 %
Platelets: 298 10*3/uL (ref 150–400)
RBC: 4.55 MIL/uL (ref 3.87–5.11)
RDW: 16.1 % — ABNORMAL HIGH (ref 11.5–15.5)
WBC: 3.9 10*3/uL — ABNORMAL LOW (ref 4.0–10.5)
nRBC: 0 % (ref 0.0–0.2)

## 2019-07-26 LAB — PROTIME-INR
INR: 1 (ref 0.8–1.2)
Prothrombin Time: 13.5 seconds (ref 11.4–15.2)

## 2019-07-26 LAB — SURGICAL PCR SCREEN
MRSA, PCR: NEGATIVE
Staphylococcus aureus: POSITIVE — AB

## 2019-07-26 LAB — ABO/RH: ABO/RH(D): O POS

## 2019-07-26 LAB — APTT: aPTT: 32 seconds (ref 24–36)

## 2019-07-26 NOTE — Progress Notes (Signed)
PCP - Dr. Farrel Gordon in [place]. Wanting to change doctors Cardiologist - none, saw one yrs. ago  PPM/ICD - na Device Orders -  Rep Notified  Chest x-ray - today EKG - today Stress Test - yrs. Ago-normal ECHO - na Cardiac Cath - na  Sleep Study - na CPAP -   Fasting Blood Sugar - na Checks Blood Sugar _____ times a day  Blood Thinner Instructions: Aspirin Instructions:na  ERAS Protcol -yes PRE-SURGERY Ensure - yes  COVID TEST- today   Anesthesia review:  Medical hx.  Patient denies shortness of breath, fever, cough and chest pain at PAT appointment   Patient verbalized understanding of instructions that were given to them at the PAT appointment. Patient was also instructed that they will need to review over the PAT instructions again at home before surgery.  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 Has seen Dr. Julien Nordmann in 2014, for nausea/dizziness/ passing out. Has not had any passing out in yrs. States Dr. Julien Nordmann told her there is" nothing he can do to help me".

## 2019-07-27 LAB — NOVEL CORONAVIRUS, NAA (HOSP ORDER, SEND-OUT TO REF LAB; TAT 18-24 HRS): SARS-CoV-2, NAA: NOT DETECTED

## 2019-07-27 MED ORDER — BUPIVACAINE LIPOSOME 1.3 % IJ SUSP
20.0000 mL | Freq: Once | INTRAMUSCULAR | Status: DC
  Filled 2019-07-27: qty 20

## 2019-07-27 MED ORDER — TRANEXAMIC ACID 1000 MG/10ML IV SOLN
2000.0000 mg | INTRAVENOUS | Status: DC
  Filled 2019-07-27: qty 20

## 2019-07-27 NOTE — Progress Notes (Signed)
Anesthesia Chart Review:  Case: [id number] Date/Time: 07/30/19 1241   Procedure: LEFT TOTAL KNEE ARTHROPLASTY (Left Knee)   Anesthesia type: Spinal   Pre-op diagnosis: degenerative joint disease left knee   Location: [facility] OR ROOM 04 / [facility]   Surgeon: Leandrew Koyanagi, MD      DISCUSSION: Patient is a 58 year old female scheduled for the above procedure.  History includes never smoker, HTN, GERD, HLD, Graves disease, pancytopenia (hematology evaluation 09/2013), anxiety, gastric bypass. BMI is consistent with obesity.    07/26/19 COVID-19 test is negative. Labs and EKG appear stable. She denied SOB, cough, fever, chest pain at PAT RN visit.  If no acute changes and I would anticipate that she can proceed as planned.   VS: BP 126/88   Pulse 100   Temp (!) 36.3 C   Resp 20   Ht '5\' 6"'  (1.676 m)   Wt 93.8 kg   LMP  (LMP Unknown) Comment: pt. states that it has been over 1 year  SpO2 99%   BMI 33.38 kg/m    PROVIDERS: Patient reported PCP as "Dr. Farrel Gordon" in [place]. (She has 2018/2019 encounters with Charlott Rakes, MD with [facility] by hematologist Curt Bears, MD on 10/23/13 for pancytopenia of unknown etiology. Blood work at that time was "not remarkable for any significant abnormalities." Bone marrow biopsy discussed and done on 11/09/13 and showed limited bone marrow material with trilineage hematopoiesis with no significant dyspoiesis but clinical correlation with cytogenetic studies recommended given limited specimen.  11/15/13 Cytogenetic analysis revealed "the presence of normal female chromosomes with no observable clonal chromosomal abnormalities."    LABS: Labs reviewed: Acceptable for surgery. WBC 3.9 which is consistent with previous results. H/H 11.8/41.0 and PLT count 298K. CMET WNL.  (all labs ordered are listed, but only abnormal results are displayed)  Labs Reviewed  SURGICAL PCR SCREEN - Abnormal; Notable for the  following components:      Result Value   Staphylococcus aureus POSITIVE (*)    All other components within normal limits  CBC WITH DIFFERENTIAL/PLATELET - Abnormal; Notable for the following components:   WBC 3.9 (*)    Hemoglobin 11.8 (*)    MCH 25.9 (*)    MCHC 28.8 (*)    RDW 16.1 (*)    All other components within normal limits  APTT  COMPREHENSIVE METABOLIC PANEL  PROTIME-INR  TYPE AND SCREEN  ABO/RH     IMAGES: CXR 07/26/19: FINDINGS: Mediastinum hilar structures normal. Heart size normal. Lungs are clear. No pleural effusion or pneumothorax. Surgical clips upper abdomen. Mild lumbar spine scoliosis concave left with diffuse mild degenerative change. IMPRESSION: No acute cardiopulmonary disease.   EKG: 07/26/19: Normal sinus rhythm Minimal voltage criteria for LVH, may be normal variant Nonspecific T wave abnormality Prolonged QT (QT/QTc 394/471 ms) Abnormal ECG No significant change since 2014 Confirmed by [place] [phone]) on 07/26/2019 4:09:23 PM   CV: Reported a normal stress test many years ago.   Past Medical History:  Diagnosis Date  . Allergy   . Anemia   . Anxiety   . Arthritis   . Colon polyps    x 3  . Depression   . Family history of adverse reaction to anesthesia    makes mother" loopy"  . Gastric ulcer   . GERD (gastroesophageal reflux disease)   . Graves disease   . Headache   . History of pancytopenia    of unknown  etiology   . Hyperlipidemia   . Hypertension   . Thyroid disease     Past Surgical History:  Procedure Laterality Date  . COLONOSCOPY    . GASTRIC BYPASS    . POLYPECTOMY      MEDICATIONS: . acetaminophen-codeine (TYLENOL #3) 300-30 MG tablet  . APAP (TYLENOL) 325 MG tablet  . atorvastatin (LIPITOR) 20 MG tablet  . atorvastatin (LIPITOR) 40 MG tablet  . busPIRone (BUSPAR) 10 MG tablet  . busPIRone (BUSPAR) 7.5 MG tablet  . citalopram (CELEXA) 20 MG tablet  . cyclobenzaprine (FLEXERIL) 10 MG tablet  .  dexlansoprazole (DEXILANT) 60 MG capsule  . diclofenac (FLECTOR) 1.3 % PTCH  . diclofenac sodium (VOLTAREN) 1 % GEL  . diclofenac sodium (VOLTAREN) 1 % GEL  . gabapentin (NEURONTIN) 100 MG capsule  . hydrochlorothiazide (HYDRODIURIL) 25 MG tablet  . ibuprofen (ADVIL,MOTRIN) 800 MG tablet  . levothyroxine (SYNTHROID, LEVOTHROID) 150 MCG tablet  . linaclotide (LINZESS) 145 MCG CAPS capsule  . losartan (COZAAR) 25 MG tablet  . meclizine (ANTIVERT) 25 MG tablet  . methylPREDNISolone (MEDROL DOSEPAK) 4 MG TBPK tablet  . methylPREDNISolone (MEDROL DOSEPAK) 4 MG TBPK tablet  . NONFORMULARY OR COMPOUNDED ITEM  . nortriptyline (PAMELOR) 25 MG capsule  . ondansetron (ZOFRAN-ODT) 4 MG disintegrating tablet  . oseltamivir (TAMIFLU) 75 MG capsule  . pantoprazole (PROTONIX) 40 MG tablet  . propranolol (INDERAL) 20 MG tablet  . urea (CARMOL) 40 % CREA  . zolpidem (AMBIEN) 5 MG tablet   No current facility-administered medications for this encounter.    Derrill Memo ON 07/30/2019] bupivacaine liposome (EXPAREL) 1.3 % injection 266 mg  . [START ON 07/30/2019] tranexamic acid (CYKLOKAPRON) 2,000 mg in sodium chloride 0.9 % 50 mL Topical Application  -She is not currently taking Tylenol 3, Tylenol, Lipitor 20 mg, Buspar 7.5 mg, Dexilant, Flector, Voltaran gel, Linzess, ibuprofen, meclizine, Medtrol Dosepak, Tamiflu, urea cream, Inderal PRN.   Myra Gianotti, PA-C Surgical Short Stay/Anesthesiology [facility] Phone [phone] [facility] Phone [phone] 07/27/2019 1:09 PM

## 2019-07-27 NOTE — Anesthesia Preprocedure Evaluation (Addendum)
Anesthesia Evaluation  Patient identified by MRN, date of birth, ID band Patient awake    Reviewed: Allergy & Precautions, NPO status , Patient's Chart, lab work & pertinent test results  Airway Mallampati: II  TM Distance: >3 FB Neck ROM: Full    Dental no notable dental hx.    Pulmonary neg pulmonary ROS,    Pulmonary exam normal breath sounds clear to auscultation       Cardiovascular hypertension, Pt. on medications negative cardio ROS Normal cardiovascular exam Rhythm:Regular Rate:Normal     Neuro/Psych  Headaches, Anxiety Depression negative psych ROS   GI/Hepatic Neg liver ROS, GERD  ,  Endo/Other  negative endocrine ROS  Renal/GU negative Renal ROS  negative genitourinary   Musculoskeletal  (+) Arthritis , Osteoarthritis,    Abdominal (+) + obese,   Peds negative pediatric ROS (+)  Hematology negative hematology ROS (+) anemia ,   Anesthesia Other Findings   Reproductive/Obstetrics negative OB ROS                            Anesthesia Physical Anesthesia Plan  ASA: II  Anesthesia Plan: Spinal   Post-op Pain Management:  Regional for Post-op pain   Induction: Intravenous  PONV Risk Score and Plan: 2 and Ondansetron, Midazolam and Treatment may vary due to age or medical condition  Airway Management Planned: Simple Face Mask  Additional Equipment:   Intra-op Plan:   Post-operative Plan:   Informed Consent: I have reviewed the patients History and Physical, chart, labs and discussed the procedure including the risks, benefits and alternatives for the proposed anesthesia with the patient or authorized representative who has indicated his/her understanding and acceptance.     Dental advisory given  Plan Discussed with: CRNA  Anesthesia Plan Comments: (PAT note written 07/27/2019 by Myra Gianotti, PA-C. )       Anesthesia Quick Evaluation

## 2019-07-30 ENCOUNTER — Encounter (HOSPITAL_COMMUNITY)
Admission: RE | Disposition: A | Discharge status: Home Health | Payer: Self-pay | Source: Home / Self Care | Attending: Orthopaedic Surgery

## 2019-07-30 ENCOUNTER — Encounter (HOSPITAL_COMMUNITY): Payer: Self-pay

## 2019-07-30 ENCOUNTER — Inpatient Hospital Stay (HOSPITAL_COMMUNITY): Payer: 59 | Admitting: Certified Registered Nurse Anesthetist

## 2019-07-30 ENCOUNTER — Observation Stay (HOSPITAL_COMMUNITY): Payer: 59

## 2019-07-30 ENCOUNTER — Inpatient Hospital Stay (HOSPITAL_COMMUNITY): Payer: 59 | Admitting: Vascular Surgery

## 2019-07-30 ENCOUNTER — Other Ambulatory Visit: Payer: Self-pay

## 2019-07-30 ENCOUNTER — Inpatient Hospital Stay (HOSPITAL_COMMUNITY)
Admission: RE | Admit: 2019-07-30 | Discharge: 2019-07-31 | DRG: 470 | Disposition: A | Discharge status: Home Health | Payer: 59 | Attending: Orthopaedic Surgery | Admitting: Orthopaedic Surgery

## 2019-07-30 DIAGNOSIS — Z96652 Presence of left artificial knee joint: Secondary | ICD-10-CM | POA: Diagnosis not present

## 2019-07-30 DIAGNOSIS — E0500 Thyrotoxicosis with diffuse goiter without thyrotoxic crisis or storm: Secondary | ICD-10-CM | POA: Diagnosis present

## 2019-07-30 DIAGNOSIS — G8918 Other acute postprocedural pain: Secondary | ICD-10-CM | POA: Diagnosis not present

## 2019-07-30 DIAGNOSIS — Z79899 Other long term (current) drug therapy: Secondary | ICD-10-CM

## 2019-07-30 DIAGNOSIS — Z86010 Personal history of colonic polyps: Secondary | ICD-10-CM

## 2019-07-30 DIAGNOSIS — E785 Hyperlipidemia, unspecified: Secondary | ICD-10-CM | POA: Diagnosis present

## 2019-07-30 DIAGNOSIS — Z8711 Personal history of peptic ulcer disease: Secondary | ICD-10-CM

## 2019-07-30 DIAGNOSIS — M1712 Unilateral primary osteoarthritis, left knee: Secondary | ICD-10-CM | POA: Diagnosis not present

## 2019-07-30 DIAGNOSIS — I10 Essential (primary) hypertension: Secondary | ICD-10-CM | POA: Diagnosis not present

## 2019-07-30 DIAGNOSIS — Z471 Aftercare following joint replacement surgery: Secondary | ICD-10-CM | POA: Diagnosis not present

## 2019-07-30 DIAGNOSIS — F419 Anxiety disorder, unspecified: Secondary | ICD-10-CM | POA: Diagnosis present

## 2019-07-30 DIAGNOSIS — Z8249 Family history of ischemic heart disease and other diseases of the circulatory system: Secondary | ICD-10-CM

## 2019-07-30 DIAGNOSIS — Z6833 Body mass index (BMI) 33.0-33.9, adult: Secondary | ICD-10-CM

## 2019-07-30 DIAGNOSIS — F329 Major depressive disorder, single episode, unspecified: Secondary | ICD-10-CM | POA: Diagnosis not present

## 2019-07-30 DIAGNOSIS — E669 Obesity, unspecified: Secondary | ICD-10-CM | POA: Diagnosis present

## 2019-07-30 DIAGNOSIS — E039 Hypothyroidism, unspecified: Secondary | ICD-10-CM | POA: Diagnosis not present

## 2019-07-30 DIAGNOSIS — Z833 Family history of diabetes mellitus: Secondary | ICD-10-CM

## 2019-07-30 DIAGNOSIS — Z885 Allergy status to narcotic agent status: Secondary | ICD-10-CM

## 2019-07-30 DIAGNOSIS — K219 Gastro-esophageal reflux disease without esophagitis: Secondary | ICD-10-CM | POA: Diagnosis not present

## 2019-07-30 DIAGNOSIS — Z79890 Hormone replacement therapy (postmenopausal): Secondary | ICD-10-CM

## 2019-07-30 DIAGNOSIS — D62 Acute posthemorrhagic anemia: Secondary | ICD-10-CM | POA: Diagnosis not present

## 2019-07-30 DIAGNOSIS — Z9884 Bariatric surgery status: Secondary | ICD-10-CM

## 2019-07-30 DIAGNOSIS — Z800 Family history of malignant neoplasm of digestive organs: Secondary | ICD-10-CM

## 2019-07-30 DIAGNOSIS — M25762 Osteophyte, left knee: Secondary | ICD-10-CM | POA: Diagnosis present

## 2019-07-30 HISTORY — PX: TOTAL KNEE ARTHROPLASTY: SHX125

## 2019-07-30 SURGERY — ARTHROPLASTY, KNEE, TOTAL
Anesthesia: Spinal | Site: Knee | Laterality: Left

## 2019-07-30 MED ORDER — SODIUM CHLORIDE 0.9 % IV SOLN
INTRAVENOUS | Status: DC
  Administered 2019-07-30 – 2019-07-31 (×2): via INTRAVENOUS

## 2019-07-30 MED ORDER — GABAPENTIN 300 MG PO CAPS
300.0000 mg | ORAL_CAPSULE | Freq: Every day | ORAL | Status: DC
  Administered 2019-07-30: 300 mg via ORAL
  Filled 2019-07-30: qty 1

## 2019-07-30 MED ORDER — VANCOMYCIN HCL 1000 MG IV SOLR
INTRAVENOUS | Status: DC | PRN
  Administered 2019-07-30: 1000 mg via TOPICAL

## 2019-07-30 MED ORDER — GABAPENTIN 300 MG PO CAPS
300.0000 mg | ORAL_CAPSULE | Freq: Three times a day (TID) | ORAL | Status: DC
  Administered 2019-07-30 – 2019-07-31 (×2): 300 mg via ORAL
  Filled 2019-07-30 (×2): qty 1

## 2019-07-30 MED ORDER — NORTRIPTYLINE HCL 25 MG PO CAPS
25.0000 mg | ORAL_CAPSULE | Freq: Every day | ORAL | Status: DC
  Administered 2019-07-31: 25 mg via ORAL
  Filled 2019-07-30 (×2): qty 1

## 2019-07-30 MED ORDER — PROMETHAZINE HCL 25 MG/ML IJ SOLN: 6.2500 mg | INTRAMUSCULAR | Status: DC | PRN

## 2019-07-30 MED ORDER — SORBITOL 70 % SOLN: 30.0000 mL | Freq: Every day | Status: DC | PRN

## 2019-07-30 MED ORDER — POLYETHYLENE GLYCOL 3350 17 G PO PACK: 17.0000 g | PACK | Freq: Every day | ORAL | Status: DC | PRN

## 2019-07-30 MED ORDER — LEVOTHYROXINE SODIUM 75 MCG PO TABS
150.0000 ug | ORAL_TABLET | Freq: Every day | ORAL | Status: DC
  Administered 2019-07-31: 150 ug via ORAL
  Filled 2019-07-30: qty 2

## 2019-07-30 MED ORDER — ZOLPIDEM TARTRATE 5 MG PO TABS: 5.0000 mg | ORAL_TABLET | Freq: Every evening | ORAL | Status: DC | PRN

## 2019-07-30 MED ORDER — TRANEXAMIC ACID-NACL 1000-0.7 MG/100ML-% IV SOLN
1000.0000 mg | INTRAVENOUS | Status: AC
  Administered 2019-07-30: 1000 mg via INTRAVENOUS

## 2019-07-30 MED ORDER — METHOCARBAMOL 500 MG PO TABS
ORAL_TABLET | ORAL | Status: AC
  Filled 2019-07-30: qty 1

## 2019-07-30 MED ORDER — CEFAZOLIN SODIUM-DEXTROSE 2-4 GM/100ML-% IV SOLN
2.0000 g | Freq: Four times a day (QID) | INTRAVENOUS | Status: AC
  Administered 2019-07-30 – 2019-07-31 (×3): 2 g via INTRAVENOUS
  Filled 2019-07-30 (×3): qty 100

## 2019-07-30 MED ORDER — HYDROMORPHONE HCL 1 MG/ML IJ SOLN
0.5000 mg | INTRAMUSCULAR | Status: DC | PRN
  Administered 2019-07-30 – 2019-07-31 (×2): 1 mg via INTRAVENOUS
  Filled 2019-07-30 (×2): qty 1

## 2019-07-30 MED ORDER — SODIUM CHLORIDE 0.9 % IR SOLN
Status: DC | PRN
  Administered 2019-07-30: 3000 mL

## 2019-07-30 MED ORDER — ACETAMINOPHEN 500 MG PO TABS
1000.0000 mg | ORAL_TABLET | Freq: Four times a day (QID) | ORAL | Status: AC
  Administered 2019-07-30 – 2019-07-31 (×4): 1000 mg via ORAL
  Filled 2019-07-30 (×4): qty 2

## 2019-07-30 MED ORDER — BUPIVACAINE-EPINEPHRINE 0.25% -1:200000 IJ SOLN
INTRAMUSCULAR | Status: DC | PRN
  Administered 2019-07-30: 20 mL

## 2019-07-30 MED ORDER — TRANEXAMIC ACID-NACL 1000-0.7 MG/100ML-% IV SOLN
INTRAVENOUS | Status: AC
  Filled 2019-07-30: qty 100

## 2019-07-30 MED ORDER — DEXAMETHASONE SODIUM PHOSPHATE 10 MG/ML IJ SOLN
10.0000 mg | Freq: Once | INTRAMUSCULAR | Status: AC
  Administered 2019-07-31: 10 mg via INTRAVENOUS
  Filled 2019-07-30: qty 1

## 2019-07-30 MED ORDER — LACTATED RINGERS IV SOLN: INTRAVENOUS | Status: DC

## 2019-07-30 MED ORDER — BUPIVACAINE LIPOSOME 1.3 % IJ SUSP
INTRAMUSCULAR | Status: DC | PRN
  Administered 2019-07-30: 20 mL

## 2019-07-30 MED ORDER — KETOROLAC TROMETHAMINE 15 MG/ML IJ SOLN
30.0000 mg | Freq: Four times a day (QID) | INTRAMUSCULAR | Status: AC
  Administered 2019-07-30 – 2019-07-31 (×4): 30 mg via INTRAVENOUS
  Filled 2019-07-30 (×4): qty 2

## 2019-07-30 MED ORDER — METOCLOPRAMIDE HCL 5 MG PO TABS: 5.0000 mg | ORAL_TABLET | Freq: Three times a day (TID) | ORAL | Status: DC | PRN

## 2019-07-30 MED ORDER — PHENOL 1.4 % MT LIQD: 1.0000 | OROMUCOSAL | Status: DC | PRN

## 2019-07-30 MED ORDER — MIDAZOLAM HCL 2 MG/2ML IJ SOLN
INTRAMUSCULAR | Status: AC
  Administered 2019-07-30: 2 mg via INTRAVENOUS
  Filled 2019-07-30: qty 2

## 2019-07-30 MED ORDER — ONDANSETRON HCL 4 MG/2ML IJ SOLN
4.0000 mg | Freq: Four times a day (QID) | INTRAMUSCULAR | Status: DC | PRN
  Administered 2019-07-30 – 2019-07-31 (×3): 4 mg via INTRAVENOUS
  Filled 2019-07-30 (×4): qty 2

## 2019-07-30 MED ORDER — ASPIRIN EC 81 MG PO TBEC: 81.0000 mg | DELAYED_RELEASE_TABLET | Freq: Two times a day (BID) | ORAL | 0 refills | Status: DC

## 2019-07-30 MED ORDER — ACETAMINOPHEN 325 MG PO TABS: 325.0000 mg | ORAL_TABLET | Freq: Four times a day (QID) | ORAL | Status: DC | PRN

## 2019-07-30 MED ORDER — METOCLOPRAMIDE HCL 5 MG/ML IJ SOLN
5.0000 mg | Freq: Three times a day (TID) | INTRAMUSCULAR | Status: DC | PRN
  Administered 2019-07-31: 10 mg via INTRAVENOUS
  Filled 2019-07-30: qty 2

## 2019-07-30 MED ORDER — PHENYLEPHRINE 40 MCG/ML (10ML) SYRINGE FOR IV PUSH (FOR BLOOD PRESSURE SUPPORT)
PREFILLED_SYRINGE | INTRAVENOUS | Status: DC | PRN
  Administered 2019-07-30 (×5): 80 ug via INTRAVENOUS

## 2019-07-30 MED ORDER — DIPHENHYDRAMINE HCL 12.5 MG/5ML PO ELIX
25.0000 mg | ORAL_SOLUTION | ORAL | Status: DC | PRN
  Administered 2019-07-30: 25 mg via ORAL
  Filled 2019-07-30: qty 10

## 2019-07-30 MED ORDER — CITALOPRAM HYDROBROMIDE 20 MG PO TABS
20.0000 mg | ORAL_TABLET | Freq: Every day | ORAL | Status: DC
  Administered 2019-07-31: 20 mg via ORAL
  Filled 2019-07-30: qty 1

## 2019-07-30 MED ORDER — BUPIVACAINE HCL (PF) 0.75 % IJ SOLN
INTRAMUSCULAR | Status: DC | PRN
  Administered 2019-07-30: 1.8 mL via INTRATHECAL

## 2019-07-30 MED ORDER — FENTANYL CITRATE (PF) 100 MCG/2ML IJ SOLN
INTRAMUSCULAR | Status: AC
  Administered 2019-07-30: 100 ug via INTRAVENOUS
  Filled 2019-07-30: qty 2

## 2019-07-30 MED ORDER — CYCLOBENZAPRINE HCL 10 MG PO TABS: 10.0000 mg | ORAL_TABLET | Freq: Two times a day (BID) | ORAL | Status: DC | PRN

## 2019-07-30 MED ORDER — ASPIRIN 81 MG PO CHEW
81.0000 mg | CHEWABLE_TABLET | Freq: Two times a day (BID) | ORAL | Status: DC
  Administered 2019-07-30 – 2019-07-31 (×2): 81 mg via ORAL
  Filled 2019-07-30 (×2): qty 1

## 2019-07-30 MED ORDER — OXYCODONE HCL ER 10 MG PO T12A: 10.0000 mg | EXTENDED_RELEASE_TABLET | Freq: Two times a day (BID) | ORAL | 0 refills | Status: AC

## 2019-07-30 MED ORDER — PROPOFOL 500 MG/50ML IV EMUL
INTRAVENOUS | Status: DC | PRN
  Administered 2019-07-30: 100 ug/kg/min via INTRAVENOUS

## 2019-07-30 MED ORDER — MENTHOL 3 MG MT LOZG: 1.0000 | LOZENGE | OROMUCOSAL | Status: DC | PRN

## 2019-07-30 MED ORDER — MIDAZOLAM HCL 2 MG/2ML IJ SOLN
2.0000 mg | Freq: Once | INTRAMUSCULAR | Status: AC
  Administered 2019-07-30: 12:00:00 2 mg via INTRAVENOUS

## 2019-07-30 MED ORDER — MAGNESIUM CITRATE PO SOLN: 1.0000 | Freq: Once | ORAL | Status: DC | PRN

## 2019-07-30 MED ORDER — OXYCODONE HCL 5 MG PO TABS
10.0000 mg | ORAL_TABLET | ORAL | Status: DC | PRN
  Administered 2019-07-31: 10 mg via ORAL

## 2019-07-30 MED ORDER — HYDROMORPHONE HCL 1 MG/ML IJ SOLN: 0.2500 mg | INTRAMUSCULAR | Status: DC | PRN

## 2019-07-30 MED ORDER — OXYCODONE HCL 5 MG PO TABS
5.0000 mg | ORAL_TABLET | ORAL | Status: DC | PRN
  Filled 2019-07-30: qty 2
  Filled 2019-07-30: qty 1

## 2019-07-30 MED ORDER — TRANEXAMIC ACID-NACL 1000-0.7 MG/100ML-% IV SOLN
1000.0000 mg | Freq: Once | INTRAVENOUS | Status: AC
  Administered 2019-07-30: 1000 mg via INTRAVENOUS
  Filled 2019-07-30 (×2): qty 100

## 2019-07-30 MED ORDER — SODIUM CHLORIDE 0.9% FLUSH
INTRAVENOUS | Status: DC | PRN
  Administered 2019-07-30 (×2): 10 mL

## 2019-07-30 MED ORDER — PANTOPRAZOLE SODIUM 40 MG PO TBEC
40.0000 mg | DELAYED_RELEASE_TABLET | Freq: Every day | ORAL | Status: DC
  Administered 2019-07-31: 40 mg via ORAL
  Filled 2019-07-30: qty 1

## 2019-07-30 MED ORDER — ROPIVACAINE HCL 5 MG/ML IJ SOLN
INTRAMUSCULAR | Status: DC | PRN
  Administered 2019-07-30: 20 mL via PERINEURAL

## 2019-07-30 MED ORDER — VANCOMYCIN HCL 1000 MG IV SOLR
INTRAVENOUS | Status: AC
  Filled 2019-07-30: qty 1000

## 2019-07-30 MED ORDER — METHOCARBAMOL 500 MG PO TABS
500.0000 mg | ORAL_TABLET | Freq: Four times a day (QID) | ORAL | Status: DC | PRN
  Administered 2019-07-30 – 2019-07-31 (×2): 500 mg via ORAL
  Filled 2019-07-30: qty 1

## 2019-07-30 MED ORDER — ALUM & MAG HYDROXIDE-SIMETH 200-200-20 MG/5ML PO SUSP: 30.0000 mL | ORAL | Status: DC | PRN

## 2019-07-30 MED ORDER — BUSPIRONE HCL 10 MG PO TABS
10.0000 mg | ORAL_TABLET | Freq: Two times a day (BID) | ORAL | Status: DC
  Administered 2019-07-30 – 2019-07-31 (×2): 10 mg via ORAL
  Filled 2019-07-30 (×2): qty 1

## 2019-07-30 MED ORDER — ATORVASTATIN CALCIUM 40 MG PO TABS
40.0000 mg | ORAL_TABLET | ORAL | Status: DC
  Administered 2019-07-30: 40 mg via ORAL
  Filled 2019-07-30: qty 1

## 2019-07-30 MED ORDER — LOSARTAN POTASSIUM 50 MG PO TABS
25.0000 mg | ORAL_TABLET | Freq: Every day | ORAL | Status: DC
  Administered 2019-07-30 – 2019-07-31 (×2): 25 mg via ORAL
  Filled 2019-07-30 (×2): qty 1

## 2019-07-30 MED ORDER — OXYCODONE HCL 5 MG PO TABS: 5.0000 mg | ORAL_TABLET | Freq: Three times a day (TID) | ORAL | 0 refills | Status: DC | PRN

## 2019-07-30 MED ORDER — OXYCODONE HCL 5 MG/5ML PO SOLN: 5.0000 mg | Freq: Once | ORAL | Status: AC | PRN

## 2019-07-30 MED ORDER — METHOCARBAMOL 1000 MG/10ML IJ SOLN
500.0000 mg | Freq: Four times a day (QID) | INTRAVENOUS | Status: DC | PRN
  Filled 2019-07-30: qty 5

## 2019-07-30 MED ORDER — OXYCODONE HCL 5 MG PO TABS
ORAL_TABLET | ORAL | Status: AC
  Filled 2019-07-30: qty 1

## 2019-07-30 MED ORDER — CEFAZOLIN SODIUM-DEXTROSE 2-4 GM/100ML-% IV SOLN
INTRAVENOUS | Status: AC
  Filled 2019-07-30: qty 100

## 2019-07-30 MED ORDER — METHOCARBAMOL 750 MG PO TABS: 750.0000 mg | ORAL_TABLET | Freq: Two times a day (BID) | ORAL | 0 refills | Status: DC | PRN

## 2019-07-30 MED ORDER — CHLORHEXIDINE GLUCONATE 4 % EX LIQD: 60.0000 mL | Freq: Once | CUTANEOUS | Status: DC

## 2019-07-30 MED ORDER — 0.9 % SODIUM CHLORIDE (POUR BTL) OPTIME
TOPICAL | Status: DC | PRN
  Administered 2019-07-30: 1000 mL

## 2019-07-30 MED ORDER — ONDANSETRON 4 MG PO TBDP: 4.0000 mg | ORAL_TABLET | Freq: Three times a day (TID) | ORAL | Status: DC | PRN

## 2019-07-30 MED ORDER — TRANEXAMIC ACID 1000 MG/10ML IV SOLN
INTRAVENOUS | Status: DC | PRN
  Administered 2019-07-30: 2000 mg via TOPICAL

## 2019-07-30 MED ORDER — SENNOSIDES-DOCUSATE SODIUM 8.6-50 MG PO TABS: 1.0000 | ORAL_TABLET | Freq: Every evening | ORAL | 1 refills | Status: DC | PRN

## 2019-07-30 MED ORDER — ONDANSETRON HCL 4 MG/2ML IJ SOLN
INTRAMUSCULAR | Status: DC | PRN
  Administered 2019-07-30: 4 mg via INTRAVENOUS

## 2019-07-30 MED ORDER — ONDANSETRON HCL 4 MG PO TABS: 4.0000 mg | ORAL_TABLET | Freq: Four times a day (QID) | ORAL | Status: DC | PRN

## 2019-07-30 MED ORDER — POVIDONE-IODINE 10 % EX SWAB: 2.0000 "application " | Freq: Once | CUTANEOUS | Status: DC

## 2019-07-30 MED ORDER — OXYCODONE HCL ER 10 MG PO T12A
10.0000 mg | EXTENDED_RELEASE_TABLET | Freq: Two times a day (BID) | ORAL | Status: DC
  Administered 2019-07-30 – 2019-07-31 (×2): 10 mg via ORAL
  Filled 2019-07-30 (×2): qty 1

## 2019-07-30 MED ORDER — FENTANYL CITRATE (PF) 100 MCG/2ML IJ SOLN
100.0000 ug | Freq: Once | INTRAMUSCULAR | Status: AC
  Administered 2019-07-30: 12:00:00 100 ug via INTRAVENOUS

## 2019-07-30 MED ORDER — CEFAZOLIN SODIUM-DEXTROSE 2-4 GM/100ML-% IV SOLN
2.0000 g | INTRAVENOUS | Status: AC
  Administered 2019-07-30: 2 g via INTRAVENOUS

## 2019-07-30 MED ORDER — CELECOXIB 200 MG PO CAPS
200.0000 mg | ORAL_CAPSULE | Freq: Two times a day (BID) | ORAL | Status: DC
  Administered 2019-07-30 – 2019-07-31 (×2): 200 mg via ORAL
  Filled 2019-07-30 (×2): qty 1

## 2019-07-30 MED ORDER — BUPIVACAINE-EPINEPHRINE 0.25% -1:200000 IJ SOLN
INTRAMUSCULAR | Status: AC
  Filled 2019-07-30: qty 1

## 2019-07-30 MED ORDER — OXYCODONE HCL 5 MG PO TABS
5.0000 mg | ORAL_TABLET | Freq: Once | ORAL | Status: AC | PRN
  Administered 2019-07-30: 17:00:00 5 mg via ORAL

## 2019-07-30 MED ORDER — DOCUSATE SODIUM 100 MG PO CAPS
100.0000 mg | ORAL_CAPSULE | Freq: Two times a day (BID) | ORAL | Status: DC
  Administered 2019-07-30 – 2019-07-31 (×2): 100 mg via ORAL
  Filled 2019-07-30 (×2): qty 1

## 2019-07-30 MED FILL — METHOCARBAMOL 750 MG TABS: 750 | 30 days supply | Qty: 60 | Fill #0

## 2019-07-30 MED FILL — oxyCODONE HCL 5 MG TABS: 5 | 5 days supply | Qty: 30 | Fill #0

## 2019-07-30 MED FILL — ASPIRIN 81MG ADULT LOW STRE: 81 | 42 days supply | Qty: 84 | Fill #0

## 2019-07-30 SURGICAL SUPPLY — 78 items
ALCOHOL 70% 16 OZ (MISCELLANEOUS) ×2 IMPLANT
BAG DECANTER FOR FLEXI CONT (MISCELLANEOUS) ×2 IMPLANT
BANDAGE ESMARK 6X9 LF (GAUZE/BANDAGES/DRESSINGS) ×1 IMPLANT
BEARING TIBIAL INST SZ4 10 KNE (Miscellaneous) IMPLANT
BENZOIN TINCTURE PRP APPL 2/3 (GAUZE/BANDAGES/DRESSINGS) ×1 IMPLANT
BLADE SAG 18X100X1.27 (BLADE) ×1 IMPLANT
BLADE SAW SGTL 13.0X1.19X90.0M (BLADE) ×2 IMPLANT
BNDG ELASTIC 6X10 VLCR STRL LF (GAUZE/BANDAGES/DRESSINGS) ×2 IMPLANT
BNDG ELASTIC 6X15 VLCR STRL LF (GAUZE/BANDAGES/DRESSINGS) ×1 IMPLANT
BNDG ESMARK 6X9 LF (GAUZE/BANDAGES/DRESSINGS) ×2
BOWL SMART MIX CTS (DISPOSABLE) ×2 IMPLANT
CLSR STERI-STRIP ANTIMIC 1/2X4 (GAUZE/BANDAGES/DRESSINGS) ×3 IMPLANT
COMP POSTERIOR FEMORAL SZ4 LFT (Femur) ×2 IMPLANT
COMPONENT PSTRER FEMRL SZ4 LT (Femur) IMPLANT
COVER SURGICAL LIGHT HANDLE (MISCELLANEOUS) ×2 IMPLANT
COVER WAND RF STERILE (DRAPES) ×2 IMPLANT
CUFF TOURN SGL QUICK 34 (TOURNIQUET CUFF) ×1
CUFF TOURN SGL QUICK 42 (TOURNIQUET CUFF) IMPLANT
CUFF TRNQT CYL 34X4.125X (TOURNIQUET CUFF) ×1 IMPLANT
DRAPE EXTREMITY T 121X128X90 (DISPOSABLE) ×2 IMPLANT
DRAPE HALF SHEET 40X57 (DRAPES) ×2 IMPLANT
DRAPE INCISE IOBAN 66X45 STRL (DRAPES) IMPLANT
DRAPE ORTHO SPLIT 77X108 STRL (DRAPES) ×2
DRAPE POUCH INSTRU U-SHP 10X18 (DRAPES) ×2 IMPLANT
DRAPE SURG ORHT 6 SPLT 77X108 (DRAPES) ×2 IMPLANT
DRAPE U-SHAPE 47X51 STRL (DRAPES) ×4 IMPLANT
DRSG PAD ABDOMINAL 8X10 ST (GAUZE/BANDAGES/DRESSINGS) ×3 IMPLANT
DURAPREP 26ML APPLICATOR (WOUND CARE) ×4 IMPLANT
ELECT CAUTERY BLADE 6.4 (BLADE) ×2 IMPLANT
ELECT REM PT RETURN 9FT ADLT (ELECTROSURGICAL) ×2
ELECTRODE REM PT RTRN 9FT ADLT (ELECTROSURGICAL) ×1 IMPLANT
GAUZE SPONGE 4X4 12PLY STRL LF (GAUZE/BANDAGES/DRESSINGS) ×2 IMPLANT
GLOVE BIOGEL PI IND STRL 7.0 (GLOVE) ×1 IMPLANT
GLOVE BIOGEL PI INDICATOR 7.0 (GLOVE) ×1
GLOVE ECLIPSE 7.0 STRL STRAW (GLOVE) ×6 IMPLANT
GLOVE SKINSENSE NS SZ7.5 (GLOVE) ×1
GLOVE SKINSENSE STRL SZ7.5 (GLOVE) ×1 IMPLANT
GLOVE SURG SYN 7.5  E (GLOVE) ×4
GLOVE SURG SYN 7.5 E (GLOVE) ×4 IMPLANT
GLOVE SURG SYN 7.5 PF PI (GLOVE) ×4 IMPLANT
GOWN STRL REIN XL XLG (GOWN DISPOSABLE) ×2 IMPLANT
GOWN STRL REUS W/ TWL LRG LVL3 (GOWN DISPOSABLE) ×1 IMPLANT
GOWN STRL REUS W/TWL LRG LVL3 (GOWN DISPOSABLE) ×1
HANDPIECE INTERPULSE COAX TIP (DISPOSABLE) ×1
HOOD PEEL AWAY FLYTE STAYCOOL (MISCELLANEOUS) ×4 IMPLANT
KIT BASIN OR (CUSTOM PROCEDURE TRAY) ×2 IMPLANT
KIT TURNOVER KIT B (KITS) ×2 IMPLANT
KNEE PATELLA ASYMMETRIC 9X29 (Knees) ×1 IMPLANT
KNEE TIBIAL COMP TRI SZ4 (Knees) ×1 IMPLANT
MANIFOLD NEPTUNE II (INSTRUMENTS) ×2 IMPLANT
MARKER SKIN DUAL TIP RULER LAB (MISCELLANEOUS) ×2 IMPLANT
NDL SPNL 18GX3.5 QUINCKE PK (NEEDLE) ×2 IMPLANT
NEEDLE SPNL 18GX3.5 QUINCKE PK (NEEDLE) ×4 IMPLANT
NS IRRIG 1000ML POUR BTL (IV SOLUTION) ×2 IMPLANT
PACK TOTAL JOINT (CUSTOM PROCEDURE TRAY) ×2 IMPLANT
PAD ARMBOARD 7.5X6 YLW CONV (MISCELLANEOUS) ×4 IMPLANT
PAD CAST 4YDX4 CTTN HI CHSV (CAST SUPPLIES) ×1 IMPLANT
PADDING CAST COTTON 4X4 STRL (CAST SUPPLIES) ×1
PADDING CAST COTTON 6X4 STRL (CAST SUPPLIES) ×2 IMPLANT
SAW OSC TIP CART 19.5X105X1.3 (SAW) ×2 IMPLANT
SET HNDPC FAN SPRY TIP SCT (DISPOSABLE) ×1 IMPLANT
STAPLER VISISTAT 35W (STAPLE) IMPLANT
SUCTION FRAZIER HANDLE 10FR (MISCELLANEOUS) ×1
SUCTION TUBE FRAZIER 10FR DISP (MISCELLANEOUS) ×1 IMPLANT
SUT ETHILON 2 0 FS 18 (SUTURE) IMPLANT
SUT MNCRL AB 4-0 PS2 18 (SUTURE) IMPLANT
SUT VIC AB 0 CT1 27 (SUTURE) ×2
SUT VIC AB 0 CT1 27XBRD ANBCTR (SUTURE) ×2 IMPLANT
SUT VIC AB 1 CTX 27 (SUTURE) ×6 IMPLANT
SUT VIC AB 2-0 CT1 27 (SUTURE) ×4
SUT VIC AB 2-0 CT1 TAPERPNT 27 (SUTURE) ×4 IMPLANT
SYR 50ML LL SCALE MARK (SYRINGE) ×4 IMPLANT
TIBIAL BEAR INST SZ4 10 KNEE (Miscellaneous) ×2 IMPLANT
TOWEL GREEN STERILE (TOWEL DISPOSABLE) ×2 IMPLANT
TOWEL GREEN STERILE FF (TOWEL DISPOSABLE) ×2 IMPLANT
TRAY CATH 16FR W/PLASTIC CATH (SET/KITS/TRAYS/PACK) IMPLANT
UNDERPAD 30X30 (UNDERPADS AND DIAPERS) ×2 IMPLANT
WRAP KNEE MAXI GEL POST OP (GAUZE/BANDAGES/DRESSINGS) ×2 IMPLANT

## 2019-07-30 NOTE — Anesthesia Procedure Notes (Signed)
Spinal  Patient location during procedure: OR Start time: 07/30/2019 12:57 PM End time: 07/30/2019 1:02 PM Staffing Anesthesiologist: Lynda Rainwater, MD Performed: anesthesiologist  Preanesthetic Checklist Completed: patient identified, site marked, surgical consent, pre-op evaluation, timeout performed, IV checked, risks and benefits discussed and monitors and equipment checked Spinal Block Patient position: sitting Prep: DuraPrep Patient monitoring: heart rate, cardiac monitor, continuous pulse ox and blood pressure Approach: midline Location: L3-4 Injection technique: single-shot Needle Needle type: Sprotte  Needle gauge: 24 G Needle length: 9 cm Assessment Sensory level: T4

## 2019-07-30 NOTE — Progress Notes (Signed)
Orthopedic Tech Progress Note Patient Details:  Julie Miranda [birth year] [id number]  CPM Left Knee CPM Left Knee: On Left Knee Flexion (Degrees): 0 Left Knee Extension (Degrees): 90  Post Interventions Patient Tolerated: Well Instructions Provided: Care of device, Adjustment of device  [name] 07/30/2019, 3:56 PM

## 2019-07-30 NOTE — Op Note (Signed)
Total Knee Arthroplasty Procedure Note  Preoperative diagnosis: Left knee osteoarthritis  Postoperative diagnosis:same  Operative procedure: Left total knee arthroplasty. CPT [phone]  Surgeon: N. Eduard Roux, MD  Assist: Madalyn Rob, PA-C; necessary for the timely completion of procedure and due to complexity of procedure.  Anesthesia: Spinal, regional  Tourniquet time: less than 60 mins  Implants used: [employer] Triatholon Uncemented Femur: PS 4 Tibia: 4 Patella: 29 mm Polyethylene: 10 mm  Indication: Julie Miranda is a 58 y.o. year old female with a history of knee pain. Having failed conservative management, the patient elected to proceed with a total knee arthroplasty.  We have reviewed the risk and benefits of the surgery and they elected to proceed after voicing understanding.  Procedure:  After informed consent was obtained and understanding of the risk were voiced including but not limited to bleeding, infection, damage to surrounding structures including nerves and vessels, blood clots, leg length inequality and the failure to achieve desired results, the operative extremity was marked with verbal confirmation of the patient in the holding area.   The patient was then brought to the operating room and transported to the operating room table in the supine position.  A tourniquet was applied to the operative extremity around the upper thigh. The operative limb was then prepped and draped in the usual sterile fashion and preoperative antibiotics were administered.  A time out was performed prior to the start of surgery confirming the correct extremity, preoperative antibiotic administration, as well as team members, implants and instruments available for the case. Correct surgical site was also confirmed with preoperative radiographs. The limb was then elevated for exsanguination and the tourniquet was inflated. A midline incision was made and a standard medial  parapatellar approach was performed.  The patella was prepared and sized to a 29 mm.  A cover was placed on the patella for protection from retractors.  We then turned our attention to the femur. Posterior cruciate ligament was sacrificed. Start site was drilled in the femur and the intramedullary distal femoral cutting guide was placed, set at 5 degrees valgus, taking 8 mm of distal resection. The distal cut was made. Osteophytes were then removed.  Extension gap was then checked. Next, the proximal tibial cutting guide was placed with appropriate slope, varus/valgus alignment and depth of resection. The proximal tibial cut was made. Gap blocks were then used to assess the extension gap and alignment, and appropriate soft tissue releases were performed. Attention was turned back to the femur, which was sized using the sizing guide to a size 4. Appropriate rotation of the femoral component was determined using epicondylar axis, Whiteside's line, and assessing the flexion gap under ligament tension. The appropriate size 4-in-1 cutting block was placed and cuts were made.  The box was then cut for the PS component.  Posterior femoral osteophytes and uncapped bone were then removed with the curved osteotome.  Trial components were placed, and stability was checked in full extension, mid-flexion, and deep flexion. Proper tibial rotation was determined and marked.  The patella tracked well without a lateral release. Trial components were then removed and tibial preparation performed.  The tibia was sized for a size 4 component.  A posterior capsular injection comprising of 20 cc of 1.3% exparel, 20 cc of 0.25% bupivicaine with epi and 20 cc of normal saline was performed for postoperative pain control. The bony surfaces were irrigated with a pulse lavage and then dried. Bone cement was vacuum mixed on the  back table, and the final components sized above were cemented into place. After cement had finished curing,  excess cement was removed. The stability of the construct was re-evaluated throughout a range of motion and found to be acceptable. The trial liner was removed, the knee was copiously irrigated, and the knee was re-evaluated for any excess bone debris. The real polyethylene liner, 10 mm thick, was inserted and checked to ensure the locking mechanism had engaged appropriately. The tourniquet was deflated and hemostasis was achieved. The wound was irrigated with normal saline.  One gram of vancomycin powder was placed in the surgical bed.  Capsular closure was performed with a #1 vicryl, subcutaneous fat closed with a 0 vicryl suture, then subcutaneous tissue closed with interrupted 2.0 vicryl suture. The skin was then closed with a 4.0 monocryl. A sterile dressing was applied.  The patient was awakened in the operating room and taken to recovery in stable condition. All sponge, needle, and instrument counts were correct at the end of the case.  Position: supine  Complications: none.  Time Out: performed   Drains/Packing: none  Estimated blood loss: minimal  Returned to Recovery Room: in good condition.   Antibiotics: yes   Mechanical VTE (DVT) Prophylaxis: sequential compression devices, TED thigh-high  Chemical VTE (DVT) Prophylaxis: aspirin  Fluid Replacement  Crystalloid: see anesthesia record Blood: none  FFP: none   Specimens Removed: 1 to pathology   Sponge and Instrument Count Correct? yes   PACU: portable radiograph - knee AP and Lateral   Plan/RTC: Return in 2 weeks for wound check.   Weight Bearing/Load Lower Extremity: full   N. Eduard Roux, MD [place] [phone] 2:21 PM

## 2019-07-30 NOTE — Transfer of Care (Signed)
Immediate Anesthesia Transfer of Care Note  Patient: Julie Miranda  Procedure(s) Performed: LEFT TOTAL KNEE ARTHROPLASTY (Left Knee)  Patient Location: PACU  Anesthesia Type:Spinal  Level of Consciousness: awake  Airway & Oxygen Therapy: Patient Spontanous Breathing and Patient connected to face mask oxygen  Post-op Assessment: Report given to RN and Post -op Vital signs reviewed and stable  Post vital signs: Reviewed and stable  Last Vitals:  Vitals Value Taken Time  BP 106/58 07/30/19 1448  Temp    Pulse 88 07/30/19 1450  Resp 16 07/30/19 1450  SpO2 100 % 07/30/19 1450  Vitals shown include unvalidated device data.  Last Pain:  Vitals:   07/30/19 1230  TempSrc:   PainSc: 0-No pain      Patients Stated Pain Goal: 3 [id number]  Complications: No apparent anesthesia complications

## 2019-07-30 NOTE — H&P (Signed)
PREOPERATIVE H&P  Chief Complaint: degenerative joint disease left knee  HPI: Julie Miranda is a 58 y.o. female who presents for surgical treatment of degenerative joint disease left knee.  She denies any changes in medical history.  Past Medical History:  Diagnosis Date  . Allergy   . Anemia   . Anxiety   . Arthritis   . Colon polyps    x 3  . Depression   . Family history of adverse reaction to anesthesia    makes mother" loopy"  . Gastric ulcer   . GERD (gastroesophageal reflux disease)   . Graves disease   . Headache   . History of pancytopenia    of unknown etiology   . Hyperlipidemia   . Hypertension   . Thyroid disease    Past Surgical History:  Procedure Laterality Date  . COLONOSCOPY    . GASTRIC BYPASS    . POLYPECTOMY     Social History   Socioeconomic History  . Marital status: Single    Spouse name: Not on file  . Number of children: Not on file  . Years of education: Not on file  . Highest education level: Not on file  Occupational History  . Not on file  Social Needs  . Financial resource strain: Not on file  . Food insecurity    Worry: Not on file    Inability: Not on file  . Transportation needs    Medical: Not on file    Non-medical: Not on file  Tobacco Use  . Smoking status: Never Smoker  . Smokeless tobacco: Never Used  Substance and Sexual Activity  . Alcohol use: Yes    Alcohol/week: 1.0 standard drinks    Types: 1 Glasses of wine per week    Comment: occasional  . Drug use: No  . Sexual activity: Not Currently    Birth control/protection: Injection  Lifestyle  . Physical activity    Days per week: Not on file    Minutes per session: Not on file  . Stress: Not on file  Relationships  . Social Herbalist on phone: Not on file    Gets together: Not on file    Attends religious service: Not on file    Active member of club or organization: Not on file    Attends meetings of clubs or organizations: Not on  file    Relationship status: Not on file  Other Topics Concern  . Not on file  Social History Narrative  . Not on file   Family History  Problem Relation Age of Onset  . Diabetes Mother   . Diabetes Maternal Aunt        x 2  . Heart disease Sister   . Pancreatic cancer Sister   . Liver cancer Maternal Aunt   . Colon cancer Neg Hx   . Kidney disease Neg Hx   . Colon polyps Neg Hx   . Esophageal cancer Neg Hx   . Rectal cancer Neg Hx   . Stomach cancer Neg Hx    Allergies  Allergen Reactions  . Morphine And Related Nausea And Vomiting   Prior to Admission medications   Medication Sig Start Date End Date Taking? Authorizing Provider  atorvastatin (LIPITOR) 40 MG tablet Take 40 mg by mouth every other day.   Yes [provider]  busPIRone (BUSPAR) 10 MG tablet Take 10 mg by mouth 2 (two) times daily.  12/05/18  Yes [provider]  citalopram (CELEXA) 20 MG tablet Take 1 tablet (20 mg total) by mouth daily. 07/26/17  Yes Newlin, Charlane Ferretti, MD  cyclobenzaprine (FLEXERIL) 10 MG tablet TAKE 1 TABLET BY MOUTH 2 TIMES DAILY AS NEEDED FOR MUSCLE SPASMS. Patient taking differently: Take 10 mg by mouth 2 (two) times daily as needed for muscle spasms.  12/08/17  Yes Charlott Rakes, MD  gabapentin (NEURONTIN) 100 MG capsule Take 1 capsule (100 mg total) by mouth at bedtime. Patient taking differently: Take 300 mg by mouth at bedtime.  08/31/18  Yes Trula Slade, DPM  hydrochlorothiazide (HYDRODIURIL) 25 MG tablet Take 1 tablet (25 mg total) by mouth daily. 07/26/17  Yes Newlin, Charlane Ferretti, MD  levothyroxine (SYNTHROID, LEVOTHROID) 150 MCG tablet TAKE 1 TABLET BY MOUTH DAILY BEFORE BREAKFAST. Patient taking differently: Take 150 mcg by mouth daily before breakfast.  07/27/17  Yes Newlin, Enobong, MD  losartan (COZAAR) 25 MG tablet Take 1 tablet (25 mg total) by mouth daily. 07/26/17  Yes Charlott Rakes, MD  nortriptyline (PAMELOR) 25 MG capsule Take 25 mg by mouth at bedtime.    Yes [provider]  ondansetron (ZOFRAN-ODT) 4 MG disintegrating tablet Take 4-8 mg by mouth every 8 (eight) hours as needed for nausea or vomiting.  09/18/18  Yes [provider]  pantoprazole (PROTONIX) 40 MG tablet Take 40 mg by mouth daily. 12/05/18  Yes [provider]  zolpidem (AMBIEN) 5 MG tablet Take 5 mg by mouth at bedtime as needed for sleep.   Yes [provider]  acetaminophen-codeine (TYLENOL #3) 300-30 MG tablet Take 1 tablet by mouth every 6 (six) hours as needed for moderate pain. Patient not taking: Reported on 07/23/2019 06/01/19   Aundra Dubin, PA-C  APAP (TYLENOL) 325 MG tablet Take 2 tablets (650 mg total) by mouth every 12 (twelve) hours as needed for headache. Patient not taking: Reported on 07/23/2019 09/05/13   Thurnell Lose, MD  atorvastatin (LIPITOR) 20 MG tablet Take 1 tablet (20 mg total) by mouth daily. Patient not taking: Reported on 07/23/2019 07/26/17   Charlott Rakes, MD  busPIRone (BUSPAR) 7.5 MG tablet Take 1 tablet (7.5 mg total) by mouth 2 (two) times daily. Patient not taking: Reported on 07/23/2019 07/26/17   Charlott Rakes, MD  dexlansoprazole (DEXILANT) 60 MG capsule Take 1 capsule (60 mg total) by mouth daily. Patient not taking: Reported on 07/23/2019 07/21/17   Jerene Bears, MD  diclofenac (FLECTOR) 1.3 % PTCH Place 1 patch onto the skin 2 (two) times daily. Patient not taking: Reported on 07/23/2019 01/23/18   Trula Slade, DPM  diclofenac sodium (VOLTAREN) 1 % GEL Apply 4 g topically 4 (four) times daily. Patient not taking: Reported on 07/23/2019 07/26/17   Charlott Rakes, MD  diclofenac sodium (VOLTAREN) 1 % GEL Apply 4 g topically 4 (four) times daily. Patient not taking: Reported on 07/23/2019 01/20/18   Trula Slade, DPM  ibuprofen (ADVIL,MOTRIN) 800 MG tablet Take 1 tablet (800 mg total) by mouth 3 (three) times daily. Patient not taking: Reported on 07/23/2019 01/16/19   Orvan July, NP   linaclotide [facility]) 145 MCG CAPS capsule Take 1 capsule (145 mcg total) by mouth daily before breakfast. Patient not taking: Reported on 07/23/2019 07/18/17   Charlott Rakes, MD  meclizine (ANTIVERT) 25 MG tablet Take 1 tablet (25 mg total) by mouth 3 (three) times daily as needed. Patient not taking: Reported on 07/23/2019 07/26/16   Charlott Rakes, MD  methylPREDNISolone (MEDROL  DOSEPAK) 4 MG TBPK tablet Take as directed Patient not taking: Reported on 07/23/2019 01/16/18   Trula Slade, DPM  methylPREDNISolone (MEDROL DOSEPAK) 4 MG TBPK tablet Take as directed Patient not taking: Reported on 07/23/2019 04/06/18   Trula Slade, DPM  NONFORMULARY OR COMPOUNDED ITEM [facility]:  Antiinflammatory Cream - Diclofenac 3%, Baclofen 2%, Lidocaine 2%, apply 1-2 grams to affected area 3-4 times a day. Patient not taking: Reported on 07/23/2019 01/20/18   Trula Slade, DPM  oseltamivir (TAMIFLU) 75 MG capsule Take 1 capsule (75 mg total) by mouth every 12 (twelve) hours. Patient not taking: Reported on 07/23/2019 01/16/19   Loura Halt A, NP  propranolol (INDERAL) 20 MG tablet Take 1 tablet (20 mg)  2 times a week and /or as needed for social or performance anxiety Patient not taking: Reported on 07/23/2019 07/26/17   Charlott Rakes, MD  urea (CARMOL) 40 % CREA Apply 1 application topically daily. Patient not taking: Reported on 07/23/2019 04/06/18   Trula Slade, DPM     Positive ROS: All other systems have been reviewed and were otherwise negative with the exception of those mentioned in the HPI and as above.  Physical Exam: General: Alert, no acute distress Cardiovascular: No pedal edema Respiratory: No cyanosis, no use of accessory musculature GI: abdomen soft Skin: No lesions in the area of chief complaint Neurologic: Sensation intact distally Psychiatric: Patient is competent for consent with normal mood and affect Lymphatic: no lymphedema  MUSCULOSKELETAL: exam  stable  Assessment: degenerative joint disease left knee  Plan: Plan for Procedure(s): LEFT TOTAL KNEE ARTHROPLASTY  The risks benefits and alternatives were discussed with the patient including but not limited to the risks of nonoperative treatment, versus surgical intervention including infection, bleeding, nerve injury,  blood clots, cardiopulmonary complications, morbidity, mortality, among others, and they were willing to proceed.   Preoperative templating of the joint replacement has been completed, documented, and submitted to the Operating Room personnel in order to optimize intra-operative equipment management.  Patient's anticipated LOS is less than 2 midnights, meeting these requirements: - Younger than 75 - Lives within 1 hour of care - Has a competent adult at home to recover with post-op recover - NO history of  - Chronic pain requiring opiods  - Diabetes  - Coronary Artery Disease  - Heart failure  - Heart attack  - Stroke  - DVT/VTE  - Cardiac arrhythmia  - Respiratory Failure/COPD  - Renal failure  - Anemia  - Advanced Liver disease  Eduard Roux, MD   07/30/2019 7:14 AM

## 2019-07-30 NOTE — Anesthesia Procedure Notes (Signed)
Anesthesia Regional Block: Adductor canal block   Pre-Anesthetic Checklist: ,, timeout performed, Correct Patient, Correct Site, Correct Laterality, Correct Procedure, Correct Position, site marked, Risks and benefits discussed,  Surgical consent,  Pre-op evaluation,  At surgeon's request and post-op pain management  Laterality: Left  Prep: chloraprep       Needles:  Injection technique: Single-shot  Needle Type: Stimiplex     Needle Length: 9cm  Needle Gauge: 21     Additional Needles:   Procedures:,,,, ultrasound used (permanent image in chart),,,,  Narrative:  Start time: 07/30/2019 12:29 PM End time: 07/30/2019 12:34 PM Injection made incrementally with aspirations every 5 mL.  Performed by: Personally  Anesthesiologist: Lynda Rainwater, MD

## 2019-07-30 NOTE — Plan of Care (Signed)

## 2019-07-31 ENCOUNTER — Telehealth: Payer: Self-pay | Admitting: Orthopaedic Surgery

## 2019-07-31 DIAGNOSIS — D62 Acute posthemorrhagic anemia: Secondary | ICD-10-CM | POA: Diagnosis not present

## 2019-07-31 DIAGNOSIS — Z79899 Other long term (current) drug therapy: Secondary | ICD-10-CM | POA: Diagnosis not present

## 2019-07-31 DIAGNOSIS — Z9884 Bariatric surgery status: Secondary | ICD-10-CM | POA: Diagnosis not present

## 2019-07-31 DIAGNOSIS — K219 Gastro-esophageal reflux disease without esophagitis: Secondary | ICD-10-CM | POA: Diagnosis present

## 2019-07-31 DIAGNOSIS — Z885 Allergy status to narcotic agent status: Secondary | ICD-10-CM | POA: Diagnosis not present

## 2019-07-31 DIAGNOSIS — I10 Essential (primary) hypertension: Secondary | ICD-10-CM | POA: Diagnosis present

## 2019-07-31 DIAGNOSIS — E785 Hyperlipidemia, unspecified: Secondary | ICD-10-CM | POA: Diagnosis present

## 2019-07-31 DIAGNOSIS — F419 Anxiety disorder, unspecified: Secondary | ICD-10-CM | POA: Diagnosis present

## 2019-07-31 DIAGNOSIS — E0500 Thyrotoxicosis with diffuse goiter without thyrotoxic crisis or storm: Secondary | ICD-10-CM | POA: Diagnosis present

## 2019-07-31 DIAGNOSIS — M1712 Unilateral primary osteoarthritis, left knee: Secondary | ICD-10-CM | POA: Diagnosis not present

## 2019-07-31 DIAGNOSIS — M25762 Osteophyte, left knee: Secondary | ICD-10-CM | POA: Diagnosis present

## 2019-07-31 DIAGNOSIS — Z86010 Personal history of colonic polyps: Secondary | ICD-10-CM | POA: Diagnosis not present

## 2019-07-31 DIAGNOSIS — Z6833 Body mass index (BMI) 33.0-33.9, adult: Secondary | ICD-10-CM | POA: Diagnosis not present

## 2019-07-31 DIAGNOSIS — Z800 Family history of malignant neoplasm of digestive organs: Secondary | ICD-10-CM | POA: Diagnosis not present

## 2019-07-31 DIAGNOSIS — Z833 Family history of diabetes mellitus: Secondary | ICD-10-CM | POA: Diagnosis not present

## 2019-07-31 DIAGNOSIS — Z79890 Hormone replacement therapy (postmenopausal): Secondary | ICD-10-CM | POA: Diagnosis not present

## 2019-07-31 DIAGNOSIS — E669 Obesity, unspecified: Secondary | ICD-10-CM | POA: Diagnosis present

## 2019-07-31 DIAGNOSIS — Z8249 Family history of ischemic heart disease and other diseases of the circulatory system: Secondary | ICD-10-CM | POA: Diagnosis not present

## 2019-07-31 DIAGNOSIS — Z8711 Personal history of peptic ulcer disease: Secondary | ICD-10-CM | POA: Diagnosis not present

## 2019-07-31 DIAGNOSIS — F329 Major depressive disorder, single episode, unspecified: Secondary | ICD-10-CM | POA: Diagnosis present

## 2019-07-31 LAB — CBC
HCT: 33.9 % — ABNORMAL LOW (ref 36.0–46.0)
Hemoglobin: 10.3 g/dL — ABNORMAL LOW (ref 12.0–15.0)
MCH: 26.3 pg (ref 26.0–34.0)
MCHC: 30.4 g/dL (ref 30.0–36.0)
MCV: 86.7 fL (ref 80.0–100.0)
Platelets: 237 10*3/uL (ref 150–400)
RBC: 3.91 MIL/uL (ref 3.87–5.11)
RDW: 16.1 % — ABNORMAL HIGH (ref 11.5–15.5)
WBC: 6.2 10*3/uL (ref 4.0–10.5)
nRBC: 0 % (ref 0.0–0.2)

## 2019-07-31 LAB — BASIC METABOLIC PANEL
Anion gap: 9 (ref 5–15)
BUN: 8 mg/dL (ref 6–20)
CO2: 26 mmol/L (ref 22–32)
Calcium: 9.2 mg/dL (ref 8.9–10.3)
Chloride: 103 mmol/L (ref 98–111)
Creatinine, Ser: 0.86 mg/dL (ref 0.44–1.00)
GFR calc Af Amer: >60 mL/min (ref >60)
GFR calc non Af Amer: >60 mL/min (ref >60)
Glucose, Bld: 114 mg/dL — ABNORMAL HIGH (ref 70–99)
Potassium: 3.7 mmol/L (ref 3.5–5.1)
Sodium: 138 mmol/L (ref 135–145)

## 2019-07-31 MED ORDER — CHLORHEXIDINE GLUCONATE CLOTH 2 % EX PADS: 6.0000 | MEDICATED_PAD | Freq: Every day | CUTANEOUS | Status: DC

## 2019-07-31 MED ORDER — INFLUENZA VAC A&B SA ADJ QUAD 0.5 ML IM PRSY: 0.5000 mL | PREFILLED_SYRINGE | INTRAMUSCULAR | Status: DC

## 2019-07-31 NOTE — Progress Notes (Signed)
Physical Therapy Treatment Patient Details Name: Julie Miranda MRN: [id number] DOB: 1961-09-30 Today's Date: 07/31/2019    History of Present Illness 58yo female s/p L TKR 07/30/19. PMH HTN, HLD, Graves disease, OA, anxiety, gastric bypass    PT Comments    Patient received in chair, very pleasant and willing to work with therapy today. Able to complete functional transfers with min guard and RW, tolerated progression of gait training to approximately 176f with RW with cues for gait pattern and step length. Able to perform sit to supine with S for safety, no physical assist given. Verbally reviewed and practiced TKR exercises, handout given to patient. She was left in bed with all needs met and questions/concerns addressed this afternoon.    Follow Up Recommendations  Follow surgeon's recommendation for DC plan and follow-up therapies     Equipment Recommendations  Rolling walker with 5" wheels;3in1 (PT)    Recommendations for Other Services       Precautions / Restrictions Precautions Precautions: Fall Restrictions Weight Bearing Restrictions: Yes LLE Weight Bearing: Weight bearing as tolerated    Mobility  Bed Mobility Overal bed mobility: Needs Assistance Bed Mobility: Sit to Supine     Supine to sit: Min assist Sit to supine: Supervision   General bed mobility comments: S for safety, no physical assist given  Transfers Overall transfer level: Needs assistance Equipment used: Rolling walker (2 wheeled) Transfers: Sit to/from Stand Sit to Stand: Min guard         General transfer comment: min guard for safety, no physical assist given  Ambulation/Gait Ambulation/Gait assistance: Supervision Gait Distance (Feet): 120 Feet Assistive device: Rolling walker (2 wheeled) Gait Pattern/deviations: Step-through pattern;Decreased step length - right;Decreased stance time - left;Decreased stride length;Decreased weight shift to left;Antalgic Gait velocity: decreased    General Gait Details: cues for upright posture, heel-toe pattern, correct sequencing and step lengths with RW   Stairs             Wheelchair Mobility    Modified Rankin (Stroke Patients Only)       Balance Overall balance assessment: Mild deficits observed, not formally tested                                          Cognition Arousal/Alertness: Awake/alert Behavior During Therapy: WFL for tasks assessed/performed Overall Cognitive Status: Within Functional Limits for tasks assessed                                        Exercises Total Joint Exercises Ankle Circles/Pumps: Both;10 reps;Supine Quad Sets: Left;5 reps;Supine Towel Squeeze: Both;5 reps;Supine Short Arc Quad: Left;5 reps;Supine Heel Slides: Left;5 reps;Supine Hip ABduction/ADduction: Left;5 reps;Supine Straight Leg Raises: Left;5 reps;Supine Long Arc Quad: Left;5 reps;Seated Knee Flexion: Left;5 reps;Seated;Supine Goniometric ROM: in supine L knee ROM 9 degrees extension, 70 degrees flexion    General Comments        Pertinent Vitals/Pain Pain Assessment: 0-10 Pain Score: 5  Pain Location: L knee Pain Descriptors / Indicators: Aching;Discomfort Pain Intervention(s): Limited activity within patient's tolerance;Monitored during session    Home Living Family/patient expects to be discharged to:: Private residence Living Arrangements: Parent;Other relatives Available Help at Discharge: Family;Available 24 hours/day Type of Home: House Home Access: Level entry   Home Layout: One level Home Equipment: CKasandra Miranda-  single point      Prior Function Level of Independence: Independent      Comments: working as Quarry manager for [employer]   PT Goals (current goals can now be found in the care plan section) Acute Rehab PT Goals Patient Stated Goal: go home, get back to work PT Goal Formulation: With patient Time For Goal Achievement: 08/14/19 Potential to Achieve Goals:  Good Progress towards PT goals: Progressing toward goals    Frequency    7X/week      PT Plan Current plan remains appropriate    Co-evaluation              AM-PAC PT "6 Clicks" Mobility   Outcome Measure  Help needed turning from your back to your side while in a flat bed without using bedrails?: None Help needed moving from lying on your back to sitting on the side of a flat bed without using bedrails?: None Help needed moving to and from a bed to a chair (including a wheelchair)?: A Little Help needed standing up from a chair using your arms (e.g., wheelchair or bedside chair)?: A Little Help needed to walk in hospital room?: A Little Help needed climbing 3-5 steps with a railing? : A Little 6 Click Score: 20    End of Session Equipment Utilized During Treatment: Gait belt Activity Tolerance: Patient tolerated treatment well Patient left: in bed;with call bell/phone within reach Nurse Communication: Mobility status PT Visit Diagnosis: Muscle weakness (generalized) (M62.81);Difficulty in walking, not elsewhere classified (R26.2);Pain Pain - Right/Left: Left Pain - part of body: Knee     Time: 1326-1350 PT Time Calculation (min) (ACUTE ONLY): 24 min  Charges:  $Gait Training: 8-22 mins $Therapeutic Exercise: 8-22 mins                     Julie Miranda PT, DPT, CBIS  Supplemental Physical Therapist [place]    Pager [phone] Acute Rehab Office [phone]

## 2019-07-31 NOTE — Plan of Care (Signed)

## 2019-07-31 NOTE — Anesthesia Postprocedure Evaluation (Signed)
Anesthesia Post Note  Patient: Julie Miranda  Procedure(s) Performed: LEFT TOTAL KNEE ARTHROPLASTY (Left Knee)     Patient location during evaluation: PACU Anesthesia Type: Spinal Level of consciousness: oriented and awake and alert Pain management: pain level controlled Vital Signs Assessment: post-procedure vital signs reviewed and stable Respiratory status: spontaneous breathing and respiratory function stable Cardiovascular status: blood pressure returned to baseline and stable Postop Assessment: no headache, no backache and no apparent nausea or vomiting Anesthetic complications: no    Last Vitals:  Vitals:   07/31/19 0053 07/31/19 0422  BP: 115/74 106/78  Pulse: 89 82  Resp: 14 16  Temp: 36.6 C 36.8 C  SpO2: 99% 97%    Last Pain:  Vitals:   07/31/19 0454  TempSrc:   PainSc: [place]

## 2019-07-31 NOTE — Telephone Encounter (Signed)
Pt called in requesting a refill on Dilauded and Linzess. Please have that sent to [facility].   [phone]

## 2019-07-31 NOTE — Progress Notes (Signed)
Discharge education provided. Patient denied any questions or concerns. 3 in 1 BSC dropped off with patient. Pt stated she was pleased with care and discharged from the unit with all her belongings.   Jenene Slicker, RN 07/31/2019 [phone]

## 2019-07-31 NOTE — Social Work (Signed)
CSW acknowledging consult for SNF placement. Will follow for therapy recommendations needed to best determine disposition/for insurance authorization. Aware pt will work with PT today for further recommendations of home needs.    Westley Hummer, MSW, [place] Work [phone]

## 2019-07-31 NOTE — Plan of Care (Signed)
  Problem: Education: Goal: Knowledge of General Education information will improve Description: Including pain rating scale, medication(s)/side effects and non-pharmacologic comfort measures 07/31/2019 1412 by Stevan Born, RN   Problem: Health Behavior/Discharge Planning: Goal: Ability to manage health-related needs will improve 07/31/2019 1412 by Satoru Milich, Curley Spice, RN Outcome: Completed/Met   Problem: Clinical Measurements: Goal: Ability to maintain clinical measurements within normal limits will improve 07/31/2019 1412 by Georgenia Salim, Curley Spice, RN Outcome: Completed/Met Goal: Will remain free from infection 07/31/2019 1412 by Stevan Born, RN Outcome: Completed/Met Goal: Diagnostic test results will improve 07/31/2019 1412 by Dezarae Mcclaran, Curley Spice, RN Outcome: Completed/Met Goal: Respiratory complications will improve 07/31/2019 1412 by Stevan Born, RN Outcome: Completed/Met Goal: Cardiovascular complication will be avoided 07/31/2019 1412 by Tira Lafferty, Curley Spice, RN Outcome: Completed/Met  Problem: Activity: Goal: Risk for activity intolerance will decrease 07/31/2019 1412 by Stevan Born, RN Outcome: Completed/Met   Problem: Nutrition: Goal: Adequate nutrition will be maintained 07/31/2019 1412 by Stevan Born, RN Outcome: Completed/Met   Problem: Coping: Goal: Level of anxiety will decrease 07/31/2019 1412 by Beatryce Colombo, Curley Spice, RN Outcome: Completed/Met   Problem: Elimination: Goal: Will not experience complications related to bowel motility 07/31/2019 1412 by Stevan Born, RN Outcome: Completed/Met Goal: Will not experience complications related to urinary retention 07/31/2019 1412 by Tajuan Dufault, Curley Spice, RN Outcome: Completed/Met   Problem: Pain Managment: Goal: General experience of comfort will improve 07/31/2019 1412 by Chelle Cayton, Curley Spice, RN Outcome: Completed/Met   Problem: Safety: Goal: Ability to remain free from injury will improve 07/31/2019 1412 by  Sequoia Mincey, Curley Spice, RN Outcome: Completed/Met  Problem: Skin Integrity: Goal: Risk for impaired skin integrity will decrease 07/31/2019 1412 by Stevan Born, RN Outcome: Completed/Met Jenene Slicker, RN 07/31/2019 1416

## 2019-07-31 NOTE — Telephone Encounter (Signed)
See message below °

## 2019-07-31 NOTE — Discharge Instructions (Signed)

## 2019-07-31 NOTE — Telephone Encounter (Signed)
Narcotic pain meds have already been called in.  Linzess I would imagine would come from pcp

## 2019-07-31 NOTE — Progress Notes (Addendum)
Subjective: 1 Day Post-Op Procedure(s) (LRB): LEFT TOTAL KNEE ARTHROPLASTY (Left) Patient reports pain as mild.  Very motivated to d/c home this afternoon  Objective: Vital signs in last 24 hours: Temp:  [97.1 F (36.2 C)-98.6 F (37 C)] 98.3 F (36.8 C) (10/06 0422) Pulse Rate:  [78-100] 82 (10/06 0422) Resp:  [12-24] 16 (10/06 0422) BP: (106-151)/(58-98) 106/78 (10/06 0422) SpO2:  [97 %-100 %] 97 % (10/06 0422) FiO2 (%):  [21 %] 21 % (10/05 1748) Weight:  [93.4 kg] 93.4 kg (10/05 1016)  Intake/Output from previous day: 10/05 0701 - 10/06 0700 In: 196.1 [P.O.:120; I.V.:28.5; IV Piggyback:47.6] Out: 450 [Urine:300; Blood:150] Intake/Output this shift: No intake/output data recorded.  Recent Labs    07/31/19 0443  HGB 10.3*   Recent Labs    07/31/19 0443  WBC 6.2  RBC 3.91  HCT 33.9*  PLT 237   Recent Labs    07/31/19 0443  NA 138  K 3.7  CL 103  CO2 26  BUN 8  CREATININE 0.86  GLUCOSE 114*  CALCIUM 9.2   No results for input(s): LABPT, INR in the last 72 hours.  Neurologically intact Neurovascular intact Sensation intact distally Intact pulses distally Dorsiflexion/Plantar flexion intact Incision: dressing C/D/I No cellulitis present Compartment soft  Able to SLR operative extremity   Assessment/Plan: 1 Day Post-Op Procedure(s) (LRB): LEFT TOTAL KNEE ARTHROPLASTY (Left) Advance diet Up with therapy D/C IV fluids Discharge home with home health after second PT session as long as she mobilizes well ABLA-mild and stable Dressing changed by me today Please apply thigh high ted hose to BLE D/c foley F/u with Dr. Erlinda Hong 2 weeks post-op   Anticipated LOS equal to or greater than 2 midnights due to - Age 57 and older with one or more of the following:  - Obesity  - Expected need for hospital services (PT, OT, Nursing) required for safe  discharge  - Anticipated need for postoperative skilled nursing care or inpatient rehab  - Active  co-morbidities: None OR   - Unanticipated findings during/Post Surgery: Slow post-op progression: GI, pain control, mobility  - Patient is a high risk of re-admission due to: None    Julie Miranda 07/31/2019, 7:24 AM

## 2019-07-31 NOTE — Plan of Care (Signed)
  Problem: Clinical Measurements: Goal: Will remain free from infection Outcome: Progressing   Problem: Activity: Goal: Risk for activity intolerance will decrease Outcome: Progressing   Problem: Pain Managment: Goal: General experience of comfort will improve Outcome: Progressing   Problem: Safety: Goal: Ability to remain free from injury will improve Outcome: Progressing   Jenene Slicker, RN 07/31/2019 1017

## 2019-07-31 NOTE — Evaluation (Signed)
Physical Therapy Evaluation Patient Details Name: Julie Miranda MRN: [id number] DOB: May 16, 1961 Today's Date: 07/31/2019   History of Present Illness  58yo female s/p L TKR 07/30/19. PMH HTN, HLD, Graves disease, OA, anxiety, gastric bypass  Clinical Impression  Patient received in bed, very pleasant and motivated to work with therapy today; L knee ROM in supine 9-70 degrees this morning. Able to complete bed mobility with MinA, then functional transfers and gait approximately 171f with RW and Min guard without difficulty. She was left up in the recliner with all needs met, RN attending and aware of mobility status. She will continue to benefit from skilled PT services in the acute setting, also recommend ongoing skilled PT services as per surgeon's recommendation moving forward.     Follow Up Recommendations Follow surgeon's recommendation for DC plan and follow-up therapies    Equipment Recommendations  Rolling walker with 5" wheels;3in1 (PT)    Recommendations for Other Services       Precautions / Restrictions Precautions Precautions: Fall Restrictions Weight Bearing Restrictions: Yes LLE Weight Bearing: Weight bearing as tolerated      Mobility  Bed Mobility Overal bed mobility: Needs Assistance Bed Mobility: Supine to Sit     Supine to sit: Min assist     General bed mobility comments: to support L LE  Transfers Overall transfer level: Needs assistance Equipment used: Rolling walker (2 wheeled) Transfers: Sit to/from Stand Sit to Stand: Min guard;Min assist         General transfer comment: initially MinA for sit to stand with RW, faded to min guard with practice; VC for hand placement and sequencing  Ambulation/Gait Ambulation/Gait assistance: Min guard Gait Distance (Feet): 100 Feet Assistive device: Rolling walker (2 wheeled) Gait Pattern/deviations: Step-through pattern;Decreased step length - right;Decreased stance time - left;Decreased stride  length;Decreased weight shift to left;Antalgic Gait velocity: decreased   General Gait Details: slow but steady with RW  Stairs            Wheelchair Mobility    Modified Rankin (Stroke Patients Only)       Balance Overall balance assessment: Mild deficits observed, not formally tested                                           Pertinent Vitals/Pain Pain Assessment: 0-10 Pain Score: 7  Pain Location: L knee Pain Descriptors / Indicators: Aching;Discomfort Pain Intervention(s): Limited activity within patient's tolerance;Monitored during session;Premedicated before session    Home Living Family/patient expects to be discharged to:: Private residence Living Arrangements: Parent;Other relatives Available Help at Discharge: Family;Available 24 hours/day Type of Home: House Home Access: Level entry     Home Layout: One level Home Equipment: Cane - single point      Prior Function Level of Independence: Independent         Comments: working as CQuarry managerfor [employer]  Dominant Hand: Right    Extremity/Trunk Assessment   Upper Extremity Assessment Upper Extremity Assessment: Generalized weakness    Lower Extremity Assessment Lower Extremity Assessment: Generalized weakness    Cervical / Trunk Assessment Cervical / Trunk Assessment: Normal  Communication   Communication: No difficulties  Cognition Arousal/Alertness: Awake/alert Behavior During Therapy: WFL for tasks assessed/performed Overall Cognitive Status: Within Functional Limits for tasks assessed  General Comments      Exercises Total Joint Exercises Goniometric ROM: in supine L knee ROM 9 degrees extension, 70 degrees flexion   Assessment/Plan    PT Assessment Patient needs continued PT services  PT Problem List Decreased strength;Decreased mobility;Decreased safety awareness;Decreased  coordination;Decreased range of motion;Obesity;Decreased activity tolerance;Decreased balance;Pain       PT Treatment Interventions DME instruction;Therapeutic activities;Gait training;Therapeutic exercise;Patient/family education;Functional mobility training;Neuromuscular re-education;Stair training;Balance training    PT Goals (Current goals can be found in the Care Plan section)  Acute Rehab PT Goals Patient Stated Goal: go home, get back to work PT Goal Formulation: With patient Time For Goal Achievement: 08/14/19 Potential to Achieve Goals: Good    Frequency 7X/week   Barriers to discharge        Co-evaluation               AM-PAC PT "6 Clicks" Mobility  Outcome Measure Help needed turning from your back to your side while in a flat bed without using bedrails?: A Little Help needed moving from lying on your back to sitting on the side of a flat bed without using bedrails?: A Little Help needed moving to and from a bed to a chair (including a wheelchair)?: A Little Help needed standing up from a chair using your arms (e.g., wheelchair or bedside chair)?: A Little Help needed to walk in hospital room?: A Little Help needed climbing 3-5 steps with a railing? : A Little 6 Click Score: 18    End of Session Equipment Utilized During Treatment: Gait belt Activity Tolerance: Patient tolerated treatment well Patient left: in chair;with call bell/phone within reach;with nursing/sitter in room Nurse Communication: Mobility status PT Visit Diagnosis: Muscle weakness (generalized) (M62.81);Difficulty in walking, not elsewhere classified (R26.2);Pain Pain - Right/Left: Left Pain - part of body: Knee    Time: 1216-2446 PT Time Calculation (min) (ACUTE ONLY): 31 min   Charges:   PT Evaluation $PT Eval Low Complexity: 1 Low PT Treatments $Gait Training: 8-22 mins        Deniece Ree PT, DPT, CBIS  Supplemental Physical Therapist [place]    Pager  [phone] Acute Rehab Office [phone]

## 2019-07-31 NOTE — TOC Transition Note (Signed)
Transition of Care [facility]) - CM/SW Discharge Note   Patient Details  Name: Julie Miranda MRN: [id number] Date of Birth: 11-03-1960  [facility] CM/SW Contact:  Alexander Mt, [place] Phone Number: 07/31/2019, 11:05 AM   Clinical Narrative:    CSW aware pt preoperatively set up with [facility] for PT. Pt states she has a rolling walker at home but needs a 3n1. Will contact [facility] for delivery to room today. Pt will work with PT one more time today before discharge home.    Final next level of care: [place] Barriers to Discharge: Continued Medical Work up   Patient Goals and CMS Choice Patient states their goals for this hospitalization and ongoing recovery are:: to go home CMS Medicare.gov Compare Post Acute Care list provided to:: Other (Comment Required)(preoperatively set up with [facility]) Choice offered to / list presented to : Patient  Discharge Placement  Discharge Plan and Services         DME Arranged: 3-N-1 DME Agency: [employer] Date DME Agency Contacted: 07/31/19   Representative spoke with at DME Agency: [place]: PT [place]: [facility] (formerly [employer]) Date [place]: 07/31/19 Time [place]: 1105 Representative spoke with at [place]: [place] ([place]) Interventions     Readmission Risk Interventions No flowsheet data found.

## 2019-07-31 NOTE — Discharge Summary (Signed)
Patient ID: Julie Miranda MRN: [id number] DOB/AGE: 04-13-1961 58 y.o.  Admit date: 07/30/2019 Discharge date: 07/31/2019  Admission Diagnoses:  Principal Problem:   Primary osteoarthritis of left knee Active Problems:   Status post total left knee replacement   Discharge Diagnoses:  Same  Past Medical History:  Diagnosis Date  . Allergy   . Anemia   . Anxiety   . Arthritis   . Colon polyps    x 3  . Depression   . Family history of adverse reaction to anesthesia    makes mother" loopy"  . Gastric ulcer   . GERD (gastroesophageal reflux disease)   . Graves disease   . Headache   . History of pancytopenia    of unknown etiology   . Hyperlipidemia   . Hypertension   . Thyroid disease     Surgeries: Procedure(s): LEFT TOTAL KNEE ARTHROPLASTY on 07/30/2019   Consultants:   Discharged Condition: Improved  Hospital Course: Julie Miranda is an 58 y.o. female who was admitted 07/30/2019 for operative treatment ofPrimary osteoarthritis of left knee. Patient has severe unremitting pain that affects sleep, daily activities, and work/hobbies. After pre-op clearance the patient was taken to the operating room on 07/30/2019 and underwent  Procedure(s): LEFT TOTAL KNEE ARTHROPLASTY.    Patient was given perioperative antibiotics:  Anti-infectives (From admission, onward)   Start     Dose/Rate Route Frequency Ordered Stop   07/30/19 1900  ceFAZolin (ANCEF) IVPB 2g/100 mL premix     2 g 200 mL/hr over 30 Minutes Intravenous Every 6 hours 07/30/19 1747 07/31/19 0638   07/30/19 1252  vancomycin (VANCOCIN) powder  Status:  Discontinued       As needed 07/30/19 1252 07/30/19 1445   07/30/19 1001  ceFAZolin (ANCEF) 2-4 GM/100ML-% IVPB    Note to Pharmacy: Therese Sarah   : cabinet override      07/30/19 1001 07/30/19 1307   07/30/19 1000  ceFAZolin (ANCEF) IVPB 2g/100 mL premix     2 g 200 mL/hr over 30 Minutes Intravenous On call to O.R. 07/30/19 [id number] 07/30/19 1307        Patient was given sequential compression devices, early ambulation, and chemoprophylaxis to prevent DVT.  Patient benefited maximally from hospital stay and there were no complications.    Recent vital signs:  Patient Vitals for the past 24 hrs:  BP Temp Temp src Pulse Resp SpO2 Height Weight  07/31/19 0422 106/78 98.3 F (36.8 C) Oral 82 16 97 % - -  07/31/19 0053 115/74 97.8 F (36.6 C) Oral 89 14 99 % - -  07/30/19 1935 136/74 97.9 F (36.6 C) Oral 80 16 100 % - -  07/30/19 1749 134/82 98 F (36.7 C) Oral 88 16 100 % - -  07/30/19 1748 - - - - - 100 % - -  07/30/19 1733 140/83 97.6 F (36.4 C) - 88 16 100 % - -  07/30/19 1719 (!) 130/98 - - 86 (!) 21 100 % - -  07/30/19 1715 - - - 91 (!) 24 100 % - -  07/30/19 1645 (!) 132/93 - - 78 13 100 % - -  07/30/19 1630 (!) 121/92 - - 79 13 99 % - -  07/30/19 1615 (!) 119/95 - - 81 14 100 % - -  07/30/19 1600 (!) 132/93 - - 80 14 100 % - -  07/30/19 1545 129/82 - - 86 18 100 % - -  07/30/19 1530 121/78 - - 82  16 98 % - -  07/30/19 1515 127/89 - - 87 17 99 % - -  07/30/19 1500 113/75 - - 88 15 100 % - -  07/30/19 1448 (!) 106/58 (!) 97.1 F (36.2 C) - 90 16 99 % - -  07/30/19 1230 (!) 143/86 - - 87 (!) 23 100 % - -  07/30/19 1225 (!) 151/90 - - 81 12 100 % - -  07/30/19 1220 - - - 82 18 100 % - -  07/30/19 1215 (!) 149/94 - - 87 16 100 % - -  07/30/19 1029 - - - - - - 5\' 6"  (1.676 m) -  07/30/19 1021 135/85 - - - - - - -  07/30/19 1016 - 98.6 F (37 C) Oral 100 20 100 % 5\' 6"  (1.676 m) 93.4 kg     Recent laboratory studies:  Recent Labs    07/31/19 0443  WBC 6.2  HGB 10.3*  HCT 33.9*  PLT 237  NA 138  K 3.7  CL 103  CO2 26  BUN 8  CREATININE 0.86  GLUCOSE 114*  CALCIUM 9.2     Discharge Medications:   Allergies as of 07/31/2019      Reactions   Morphine And Related Nausea And Vomiting      Medication List    STOP taking these medications   acetaminophen-codeine 300-30 MG tablet Commonly known as:  TYLENOL #3   APAP 325 MG tablet Commonly known as: Tylenol   cyclobenzaprine 10 MG tablet Commonly known as: FLEXERIL   dexlansoprazole 60 MG capsule Commonly known as: Dexilant   diclofenac 1.3 % Ptch Commonly known as: FLECTOR   diclofenac sodium 1 % Gel Commonly known as: VOLTAREN   ibuprofen 800 MG tablet Commonly known as: ADVIL   linaclotide 145 MCG Caps capsule Commonly known as: Linzess   meclizine 25 MG tablet Commonly known as: ANTIVERT   methylPREDNISolone 4 MG Tbpk tablet Commonly known as: MEDROL DOSEPAK   NONFORMULARY OR COMPOUNDED ITEM   oseltamivir 75 MG capsule Commonly known as: TAMIFLU   propranolol 20 MG tablet Commonly known as: INDERAL   urea 40 % Crea Commonly known as: CARMOL     TAKE these medications   aspirin EC 81 MG tablet Take 1 tablet (81 mg total) by mouth 2 (two) times daily.   atorvastatin 40 MG tablet Commonly known as: LIPITOR Take 40 mg by mouth every other day. What changed: Another medication with the same name was removed. Continue taking this medication, and follow the directions you see here.   busPIRone 10 MG tablet Commonly known as: BUSPAR Take 10 mg by mouth 2 (two) times daily. What changed: Another medication with the same name was removed. Continue taking this medication, and follow the directions you see here.   citalopram 20 MG tablet Commonly known as: CELEXA Take 1 tablet (20 mg total) by mouth daily.   gabapentin 100 MG capsule Commonly known as: NEURONTIN Take 1 capsule (100 mg total) by mouth at bedtime. What changed: how much to take   hydrochlorothiazide 25 MG tablet Commonly known as: HYDRODIURIL Take 1 tablet (25 mg total) by mouth daily.   levothyroxine 150 MCG tablet Commonly known as: SYNTHROID TAKE 1 TABLET BY MOUTH DAILY BEFORE BREAKFAST. What changed:   how much to take  how to take this  when to take this  additional instructions   losartan 25 MG tablet Commonly known  as: COZAAR Take 1 tablet (25 mg total)  by mouth daily.   methocarbamol 750 MG tablet Commonly known as: ROBAXIN Take 1 tablet (750 mg total) by mouth 2 (two) times daily as needed for muscle spasms.   nortriptyline 25 MG capsule Commonly known as: PAMELOR Take 25 mg by mouth at bedtime.   ondansetron 4 MG disintegrating tablet Commonly known as: ZOFRAN-ODT Take 4-8 mg by mouth every 8 (eight) hours as needed for nausea or vomiting.   oxyCODONE 5 MG immediate release tablet Commonly known as: Oxy IR/ROXICODONE Take 1-2 tablets (5-10 mg total) by mouth every 8 (eight) hours as needed for severe pain.   oxyCODONE 10 mg 12 hr tablet Commonly known as: OXYCONTIN Take 1 tablet (10 mg total) by mouth every 12 (twelve) hours for 3 days.   pantoprazole 40 MG tablet Commonly known as: PROTONIX Take 40 mg by mouth daily.   senna-docusate 8.6-50 MG tablet Commonly known as: Senokot S Take 1-2 tablets by mouth at bedtime as needed.   zolpidem 5 MG tablet Commonly known as: AMBIEN Take 5 mg by mouth at bedtime as needed for sleep.            Durable Medical Equipment  (From admission, onward)         Start     Ordered   07/30/19 1748  DME Walker rolling  Once    Question:  Patient needs a walker to treat with the following condition  Answer:  Total knee replacement status   07/30/19 1747   07/30/19 1748  DME 3 n 1  Once     07/30/19 1747   07/30/19 1748  DME Bedside commode  Once    Question:  Patient needs a bedside commode to treat with the following condition  Answer:  Total knee replacement status   07/30/19 1747          Diagnostic Studies: Dg Chest 2 View  Result Date: 07/26/2019 CLINICAL DATA:  Preoperative chest x-ray for knee replacement. EXAM: CHEST - 2 VIEW COMPARISON:  No recent prior. FINDINGS: Mediastinum hilar structures normal. Heart size normal. Lungs are clear. No pleural effusion or pneumothorax. Surgical clips upper abdomen. Mild lumbar spine  scoliosis concave left with diffuse mild degenerative change. IMPRESSION: No acute cardiopulmonary disease. Electronically Signed   By: Marcello Moores  Register   On: 07/26/2019 10:41   Mr Knee Left W/o Contrast  Result Date: 07/04/2019 CLINICAL DATA:  Left knee pain and popping and weakness for 3 months. EXAM: MRI OF THE LEFT KNEE WITHOUT CONTRAST TECHNIQUE: Multiplanar, multisequence MR imaging of the knee was performed. No intravenous contrast was administered. COMPARISON:  Radiographs 06/01/2019 FINDINGS: MENISCI Medial meniscus: There is a radial tear involving the posterior horn with detachment from the meniscal root and associated mild medial protrusion of the meniscus. Lateral meniscus: Intact. Degenerative changes involving the anterior horn. LIGAMENTS Cruciates:  Intact Collaterals:  Intact CARTILAGE Patellofemoral:  Degenerative chondrosis along the patellar apex. Medial: Moderate degenerative chondrosis and associated joint space narrowing and early spurring. Lateral:  Mild to moderate degenerative chondrosis. Joint:  Small joint effusion. Popliteal Fossa:  Small Baker's cyst. Extensor Mechanism: The patella retinacular structures are intact and the quadriceps and patellar tendons are intact. Bones: Osteochondral lesion involving the medial femoral condyle with significant surrounding marrow edema. No cartilage defect. Other: Normal knee musculature. IMPRESSION: 1. Radial tear involving the posterior horn of the medial meniscus at the meniscal root. 2. Intact ligamentous structures and no acute bony findings. 3. Osteochondral lesion involving the medial femoral condyle with  surrounding marrow edema. 4. Small joint effusion and small Baker's cyst. 5. Tricompartmental degenerative changes, most significant in the medial compartment. Electronically Signed   By: Marijo Sanes M.D.   On: 07/04/2019 11:42   Dg Knee Left Port  Result Date: 07/30/2019 CLINICAL DATA:  Status post left knee replacement. EXAM:  PORTABLE LEFT KNEE - 1-2 VIEW COMPARISON:  None. FINDINGS: The patient is status post left knee replacement. Hardware is in good position. Postoperative air seen in the soft tissues. IMPRESSION: Left knee replacement as above. Electronically Signed   By: Dorise Bullion III M.D   On: 07/30/2019 15:25    Disposition: Discharge disposition: 01-Home or Self Care         Follow-up Information    Leandrew Koyanagi, MD. Schedule an appointment as soon as possible for a visit in 2 week(s).   Specialty: Orthopedic Surgery Contact information: [place] [id number] [phone]            Signed: Aundra Dubin 07/31/2019, 7:27 AM

## 2019-08-01 ENCOUNTER — Telehealth: Payer: Self-pay | Admitting: Orthopaedic Surgery

## 2019-08-01 ENCOUNTER — Encounter (HOSPITAL_COMMUNITY): Payer: Self-pay | Admitting: Orthopaedic Surgery

## 2019-08-01 MED ORDER — KETOROLAC TROMETHAMINE 10 MG PO TABS: 10.0000 mg | ORAL_TABLET | Freq: Two times a day (BID) | ORAL | 0 refills | Status: DC | PRN

## 2019-08-01 MED FILL — KETOROLAC 10 MG TABLET: 10 | 5 days supply | Qty: 10 | Fill #0

## 2019-08-01 NOTE — Telephone Encounter (Signed)
Called patient to advise. She is aware Toradol was sent into pharm.

## 2019-08-01 NOTE — Telephone Encounter (Signed)
Can you call patient to let her know per Mendel Ryder "Narcotic pain meds have already been called in.  Linzess I would imagine would come from pcp"

## 2019-08-01 NOTE — Telephone Encounter (Signed)
See other message

## 2019-08-01 NOTE — Telephone Encounter (Signed)
See message below °

## 2019-08-01 NOTE — Addendum Note (Signed)
Addended by: Azucena Cecil on: 08/01/2019 01:12 PM   Modules accepted: Orders

## 2019-08-01 NOTE — Telephone Encounter (Signed)
Patient called. Says she is at the pharmacy. She is in a lot of pain. Need pain meds called in. Her call back number is [phone]

## 2019-08-01 NOTE — Telephone Encounter (Signed)
I sent in 10 tabs of toradol

## 2019-08-01 NOTE — Telephone Encounter (Signed)
IC patient and she advised that the Pharmacist at [facility] spoke with Dr. Erlinda Hong to let him know that the OxyContin would not be ready for 6 or 7 days, so he cancelled that RX.  Patient wants to know what other medication is Dr. Erlinda Hong going to prescribed in the meantime.  She was able to get the Oxycodone.  CB#[phone].  Thank you.

## 2019-08-02 ENCOUNTER — Telehealth: Payer: Self-pay | Admitting: Orthopaedic Surgery

## 2019-08-02 ENCOUNTER — Other Ambulatory Visit: Payer: Self-pay | Admitting: *Deleted

## 2019-08-02 DIAGNOSIS — I10 Essential (primary) hypertension: Secondary | ICD-10-CM | POA: Diagnosis not present

## 2019-08-02 DIAGNOSIS — Z471 Aftercare following joint replacement surgery: Secondary | ICD-10-CM | POA: Diagnosis not present

## 2019-08-02 DIAGNOSIS — E0500 Thyrotoxicosis with diffuse goiter without thyrotoxic crisis or storm: Secondary | ICD-10-CM | POA: Diagnosis not present

## 2019-08-02 DIAGNOSIS — K259 Gastric ulcer, unspecified as acute or chronic, without hemorrhage or perforation: Secondary | ICD-10-CM | POA: Diagnosis not present

## 2019-08-02 DIAGNOSIS — K219 Gastro-esophageal reflux disease without esophagitis: Secondary | ICD-10-CM | POA: Diagnosis not present

## 2019-08-02 DIAGNOSIS — F419 Anxiety disorder, unspecified: Secondary | ICD-10-CM | POA: Diagnosis not present

## 2019-08-02 DIAGNOSIS — E785 Hyperlipidemia, unspecified: Secondary | ICD-10-CM | POA: Diagnosis not present

## 2019-08-02 DIAGNOSIS — F329 Major depressive disorder, single episode, unspecified: Secondary | ICD-10-CM | POA: Diagnosis not present

## 2019-08-02 DIAGNOSIS — D649 Anemia, unspecified: Secondary | ICD-10-CM | POA: Diagnosis not present

## 2019-08-02 MED ORDER — OXYCODONE HCL 5 MG PO TABS: 5.0000 mg | ORAL_TABLET | Freq: Three times a day (TID) | ORAL | 0 refills | Status: DC | PRN

## 2019-08-02 NOTE — Telephone Encounter (Signed)
Called PT to advise. No answer.

## 2019-08-02 NOTE — Patient Outreach (Signed)
[facility]) Care Management  08/02/2019  Julie Miranda [birth year] [id number]  Transition of care telephone call  Referral received: 07/26/19 Initial outreach: 08/02/19 Insurance: [place]  Initial unsuccessful telephone call to patient's preferred (mobile) number in order to complete transition of care assessment; no answer, left HIPAA compliant voicemail message requesting return call.   Objective: Per the electronic medical record, Julie Miranda  was hospitalized at [facility] from 10/5-10/6/20  For left total knee arthroplasty.   Comorbidities include: HTN, GERD, Graves Disease, hypothyroidism, carpal tunnel syndrome, arthritis, anemia, depression, anxiety, hyperlipidemia She was discharged to home on 07/31/19 with home health physical therapy to be provided by [facility] and [facility] in [facility]. Per Education officer, museum note of 10/6, patient had rolling walker at home.  Plan: This RNCM will route unsuccessful outreach letter with [place] Management pamphlet and 24 hour Nurse Advice Line Magnet to [place] [employer] clinical pool to be mailed to patient's home address. This RNCM will attempt another outreach within 4 business days.

## 2019-08-02 NOTE — Telephone Encounter (Signed)
See message below °

## 2019-08-02 NOTE — Addendum Note (Signed)
Addended by: Azucena Cecil on: 08/02/2019 03:10 PM   Modules accepted: Orders

## 2019-08-02 NOTE — Telephone Encounter (Signed)
Patient called. She would like a refill on her Oxycodone. Says she only has enough for today.

## 2019-08-02 NOTE — Telephone Encounter (Signed)
done

## 2019-08-03 MED FILL — LOSARTAN POTASSIUM 25 MG TA: 25 | 7 days supply | Qty: 7 | Fill #0

## 2019-08-04 DIAGNOSIS — I10 Essential (primary) hypertension: Secondary | ICD-10-CM | POA: Diagnosis not present

## 2019-08-04 DIAGNOSIS — F329 Major depressive disorder, single episode, unspecified: Secondary | ICD-10-CM | POA: Diagnosis not present

## 2019-08-04 DIAGNOSIS — E785 Hyperlipidemia, unspecified: Secondary | ICD-10-CM | POA: Diagnosis not present

## 2019-08-04 DIAGNOSIS — K259 Gastric ulcer, unspecified as acute or chronic, without hemorrhage or perforation: Secondary | ICD-10-CM | POA: Diagnosis not present

## 2019-08-04 DIAGNOSIS — K219 Gastro-esophageal reflux disease without esophagitis: Secondary | ICD-10-CM | POA: Diagnosis not present

## 2019-08-04 DIAGNOSIS — Z471 Aftercare following joint replacement surgery: Secondary | ICD-10-CM | POA: Diagnosis not present

## 2019-08-04 DIAGNOSIS — D649 Anemia, unspecified: Secondary | ICD-10-CM | POA: Diagnosis not present

## 2019-08-04 DIAGNOSIS — E0500 Thyrotoxicosis with diffuse goiter without thyrotoxic crisis or storm: Secondary | ICD-10-CM | POA: Diagnosis not present

## 2019-08-04 DIAGNOSIS — F419 Anxiety disorder, unspecified: Secondary | ICD-10-CM | POA: Diagnosis not present

## 2019-08-06 DIAGNOSIS — K219 Gastro-esophageal reflux disease without esophagitis: Secondary | ICD-10-CM | POA: Diagnosis not present

## 2019-08-06 DIAGNOSIS — E785 Hyperlipidemia, unspecified: Secondary | ICD-10-CM | POA: Diagnosis not present

## 2019-08-06 DIAGNOSIS — F419 Anxiety disorder, unspecified: Secondary | ICD-10-CM | POA: Diagnosis not present

## 2019-08-06 DIAGNOSIS — K259 Gastric ulcer, unspecified as acute or chronic, without hemorrhage or perforation: Secondary | ICD-10-CM | POA: Diagnosis not present

## 2019-08-06 DIAGNOSIS — I10 Essential (primary) hypertension: Secondary | ICD-10-CM | POA: Diagnosis not present

## 2019-08-06 DIAGNOSIS — E0500 Thyrotoxicosis with diffuse goiter without thyrotoxic crisis or storm: Secondary | ICD-10-CM | POA: Diagnosis not present

## 2019-08-06 DIAGNOSIS — D649 Anemia, unspecified: Secondary | ICD-10-CM | POA: Diagnosis not present

## 2019-08-06 DIAGNOSIS — F329 Major depressive disorder, single episode, unspecified: Secondary | ICD-10-CM | POA: Diagnosis not present

## 2019-08-06 DIAGNOSIS — Z471 Aftercare following joint replacement surgery: Secondary | ICD-10-CM | POA: Diagnosis not present

## 2019-08-07 ENCOUNTER — Telehealth: Payer: Self-pay | Admitting: Orthopaedic Surgery

## 2019-08-07 ENCOUNTER — Encounter: Payer: Self-pay | Admitting: *Deleted

## 2019-08-07 ENCOUNTER — Other Ambulatory Visit: Payer: Self-pay | Admitting: *Deleted

## 2019-08-07 MED ORDER — HYDROCODONE-ACETAMINOPHEN 7.5-325 MG PO TABS: 1.0000 | ORAL_TABLET | Freq: Three times a day (TID) | ORAL | 0 refills | Status: DC | PRN

## 2019-08-07 MED FILL — HYDROCODON-APAP 7.5-325: 7.5-325 | 5 days supply | Qty: 30 | Fill #0

## 2019-08-07 NOTE — Telephone Encounter (Signed)
I sent in norco.  Please send all rx requests to [facility] in the future.  Thanks.

## 2019-08-07 NOTE — Telephone Encounter (Signed)
Patient called advised the Oxycodone made her sick and when she took the nausea medicine she threw that up as well. Patient said she have not slept in 2 days because of the pain. Patient said she is hurting a lot. The number to contact patient is [phone]

## 2019-08-07 NOTE — Telephone Encounter (Signed)
See message below °

## 2019-08-07 NOTE — Telephone Encounter (Signed)
Called patient to let her know

## 2019-08-07 NOTE — Patient Outreach (Signed)
[facility]) Care Management  08/07/2019  Julie Miranda 07-21-1961 [id number]    Transition of care call/case closure   Referral received: 07/26/19 Initial outreach: 08/02/19 Insurance: [employer] Save Plan   Subjective: Second outreach attempt successful to patient's preferred (mobile) number in order to complete transition of care assessment; 2 HIPAA identifiers verified. Explained purpose of call and completed transition of care assessment.  [name] states she is not doing well, her pain is not adequately relieved because the oxycodone is causing nausea and vomiting. She says she is not able to take her maintenance medications due to the GI upset. She says she just returned from getting a new prescription for Toradol filled at the [facility]. She says she hopes the Toradol will give her some relief from the surgical pain without causing nausea and/or vomiting. She says her surgical dressing is dry and intact and that her surgical incision looked unremarkable when the home health physical therapist changed it yesterday.  She says she is an active member of [facility]'s chronic disease management program, [employer] Management.  She denies educational needs related to staying safe during the COVID 19 pandemic.    Objective:  Per the electronic medical record, Julie Miranda  was hospitalized at [facility] from 10/5-10/6/20  For left total knee arthroplasty.   Comorbidities include: HTN, GERD, Graves Disease, hypothyroidism, carpal tunnel syndrome, arthritis, anemia, depression, anxiety, hyperlipidemia She was discharged to home on 07/31/19 with home health physical therapy to be provided by [facility] and [facility]. Per Education officer, museum note of 10/6, patient had rolling walker at home.  Assessment:  Patient voices good understanding of all discharge instructions.  See transition of care flowsheet for assessment details.   Plan:  Advised  patient to call surgeon for direction on resuming her Celexa and Pamelor and if the Toradol does not relieve her pain. Also suggested she try taking Zofran 30 minutes prior to taking pain medication to lessen the GI side effects.  Reviewed hospital discharge diagnosis of left knee arthroplasty  and discharge treatment plan using hospital discharge instructions, assessing medication adherence, reviewing problems requiring provider notification, and discussing the importance of follow up with surgeon as directed. Reviewed [place] announcements that all [place] members will receive the Healthy Lifestyle Premium rate in 2021.  Using [place] website, verified that patient is an active participate in [facility]'s [employer] Management chronic disease management program.   No ongoing care management needs identified so will close case to [facility] services. An unsuccessful outreach letter with [place] Management pamphlet and 24 Hour Nurse Line Magnet to [place] Management was routed to the clinical pool to be mailed to patient's home address on 08/02/19.  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   Julie Ellison RN,CCM,CDE [place] Management Coordinator Office Phone [phone] Office Fax [phone]

## 2019-08-09 DIAGNOSIS — E0500 Thyrotoxicosis with diffuse goiter without thyrotoxic crisis or storm: Secondary | ICD-10-CM | POA: Diagnosis not present

## 2019-08-09 DIAGNOSIS — F419 Anxiety disorder, unspecified: Secondary | ICD-10-CM | POA: Diagnosis not present

## 2019-08-09 DIAGNOSIS — E785 Hyperlipidemia, unspecified: Secondary | ICD-10-CM | POA: Diagnosis not present

## 2019-08-09 DIAGNOSIS — K219 Gastro-esophageal reflux disease without esophagitis: Secondary | ICD-10-CM | POA: Diagnosis not present

## 2019-08-09 DIAGNOSIS — D649 Anemia, unspecified: Secondary | ICD-10-CM | POA: Diagnosis not present

## 2019-08-09 DIAGNOSIS — Z471 Aftercare following joint replacement surgery: Secondary | ICD-10-CM | POA: Diagnosis not present

## 2019-08-09 DIAGNOSIS — I10 Essential (primary) hypertension: Secondary | ICD-10-CM | POA: Diagnosis not present

## 2019-08-09 DIAGNOSIS — K259 Gastric ulcer, unspecified as acute or chronic, without hemorrhage or perforation: Secondary | ICD-10-CM | POA: Diagnosis not present

## 2019-08-09 DIAGNOSIS — F329 Major depressive disorder, single episode, unspecified: Secondary | ICD-10-CM | POA: Diagnosis not present

## 2019-08-13 DIAGNOSIS — E0500 Thyrotoxicosis with diffuse goiter without thyrotoxic crisis or storm: Secondary | ICD-10-CM | POA: Diagnosis not present

## 2019-08-13 DIAGNOSIS — F329 Major depressive disorder, single episode, unspecified: Secondary | ICD-10-CM | POA: Diagnosis not present

## 2019-08-13 DIAGNOSIS — E785 Hyperlipidemia, unspecified: Secondary | ICD-10-CM | POA: Diagnosis not present

## 2019-08-13 DIAGNOSIS — F419 Anxiety disorder, unspecified: Secondary | ICD-10-CM | POA: Diagnosis not present

## 2019-08-13 DIAGNOSIS — K259 Gastric ulcer, unspecified as acute or chronic, without hemorrhage or perforation: Secondary | ICD-10-CM | POA: Diagnosis not present

## 2019-08-13 DIAGNOSIS — I10 Essential (primary) hypertension: Secondary | ICD-10-CM | POA: Diagnosis not present

## 2019-08-13 DIAGNOSIS — Z471 Aftercare following joint replacement surgery: Secondary | ICD-10-CM | POA: Diagnosis not present

## 2019-08-13 DIAGNOSIS — K219 Gastro-esophageal reflux disease without esophagitis: Secondary | ICD-10-CM | POA: Diagnosis not present

## 2019-08-13 DIAGNOSIS — D649 Anemia, unspecified: Secondary | ICD-10-CM | POA: Diagnosis not present

## 2019-08-14 ENCOUNTER — Encounter: Payer: Self-pay | Admitting: Orthopaedic Surgery

## 2019-08-14 ENCOUNTER — Ambulatory Visit (INDEPENDENT_AMBULATORY_CARE_PROVIDER_SITE_OTHER): Payer: 59 | Admitting: Orthopaedic Surgery

## 2019-08-14 ENCOUNTER — Other Ambulatory Visit: Payer: Self-pay

## 2019-08-14 DIAGNOSIS — Z96652 Presence of left artificial knee joint: Secondary | ICD-10-CM

## 2019-08-14 MED ORDER — NORTRIPTYLINE HCL 25 MG PO CAPS: 25.0000 mg | ORAL_CAPSULE | Freq: Every day | ORAL | 0 refills | Status: DC

## 2019-08-14 MED ORDER — ZOLPIDEM TARTRATE 5 MG PO TABS: 5.0000 mg | ORAL_TABLET | Freq: Every evening | ORAL | 0 refills | Status: DC | PRN

## 2019-08-14 MED ORDER — HYDROCODONE-ACETAMINOPHEN 7.5-325 MG PO TABS: 1.0000 | ORAL_TABLET | Freq: Three times a day (TID) | ORAL | 0 refills | Status: DC | PRN

## 2019-08-14 MED FILL — NORTRIPTYLINE HCL 25 MG CAP: 25 | 30 days supply | Qty: 30 | Fill #0

## 2019-08-14 MED FILL — HYDROCODON-APAP 7.5-325: 7.5-325 | 5 days supply | Qty: 30 | Fill #0

## 2019-08-14 MED FILL — ZOLPIDEM TARTRATE 5 MG TAB: 5 | 30 days supply | Qty: 20 | Fill #0

## 2019-08-14 NOTE — Progress Notes (Signed)
Post-Op Visit Note   Patient: Julie Miranda           Date of Birth: [birth year]           MRN: [id number] Visit Date: 08/14/2019 PCP: Patient, No Pcp Per   Assessment & Plan:  Chief Complaint:  Chief Complaint  Patient presents with  . Left Knee - Pain, Routine Post Op   Visit Diagnoses:  1. Status post total left knee replacement     Plan: [name] is 2 weeks status post left total knee replacement.  She returns today for her first postoperative appointment.  She is doing well overall pain and some dizziness from the pain.  She has trouble getting comfortable at night.  She has progressed very well with home health PT and is now ready for outpatient PT.  The surgical incision is healing without any signs of infection.  She has really good range of motion at this time.  Outpatient PT prescription was provided today.  Refill of her medications provided as well.  Questions encouraged and answered.  Follow-up in 4 weeks with two-view x-rays of the left knee.  Follow-Up Instructions: Return in about 4 weeks (around 09/11/2019).   Orders:  No orders of the defined types were placed in this encounter.  Meds ordered this encounter  Medications  . HYDROcodone-acetaminophen (NORCO) 7.5-325 MG tablet    Sig: Take 1-2 tablets by mouth 3 (three) times daily as needed for moderate pain.    Dispense:  30 tablet    Refill:  0  . nortriptyline (PAMELOR) 25 MG capsule    Sig: Take 1 capsule (25 mg total) by mouth at bedtime.    Dispense:  30 capsule    Refill:  0  . zolpidem (AMBIEN) 5 MG tablet    Sig: Take 1-2 tablets (5-10 mg total) by mouth at bedtime as needed for sleep.    Dispense:  20 tablet    Refill:  0    Imaging: No results found.  PMFS History: Patient Active Problem List   Diagnosis Date Noted  . Status post total left knee replacement 07/30/2019  . Primary osteoarthritis of left knee 06/05/2019  . Chronic pain of left knee 06/05/2019  . Carpal tunnel syndrome  04/25/2017  . Cervicalgia 07/30/2016  . Synovitis of shoulder 05/21/2016  . Bilateral shoulder pain 03/08/2016  . Pure hypercholesterolemia 03/03/2016  . Costochondritis 03/01/2016  . Generalized anxiety disorder 03/01/2016  . De Quervain's tenosynovitis, bilateral 07/23/2015  . Neutropenia ([place]) 07/11/2015  . GERD (gastroesophageal reflux disease) 10/30/2013  . Endometrial mass 10/04/2013  . Leukocytopenia, unspecified 09/24/2013  . Dehydration 06/28/2013  . Anemia, unspecified 06/28/2013  . Acute low back pain due to trauma 06/28/2013  . Hypothyroidism 06/27/2013  . Syncope and collapse 06/27/2013  . Arthritis 06/27/2013  . Acute renal failure ([place]) 06/27/2013  . Essential hypertension, benign 06/27/2013  . 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 Chronic lower back pain 06/27/2013  . Hypokalemia 06/27/2013  . Hypertension 06/27/2013   Past Medical History:  Diagnosis Date  . Allergy   . Anemia   . Anxiety   . Arthritis   . Colon polyps    x 3  . Depression   . Family history of adverse reaction to anesthesia    makes mother" loopy"  . Gastric ulcer   . GERD (gastroesophageal reflux disease)   . Graves disease   . Headache   . History of pancytopenia    of unknown etiology   .  Hyperlipidemia   . Hypertension   . Thyroid disease     Family History  Problem Relation Age of Onset  . Diabetes Mother   . Diabetes Maternal Aunt        x 2  . Heart disease Sister   . Pancreatic cancer Sister   . Liver cancer Maternal Aunt   . Colon cancer Neg Hx   . Kidney disease Neg Hx   . Colon polyps Neg Hx   . Esophageal cancer Neg Hx   . Rectal cancer Neg Hx   . Stomach cancer Neg Hx     Past Surgical History:  Procedure Laterality Date  . COLONOSCOPY    . GASTRIC BYPASS    . POLYPECTOMY    . TOTAL KNEE ARTHROPLASTY Left 07/30/2019   Procedure: LEFT TOTAL KNEE ARTHROPLASTY;  Surgeon: Leandrew Koyanagi, MD;  Location: [facility];  Service: Orthopedics;  Laterality: Left;    Social History   Occupational History  . Occupation: Chartered certified accountant  Tobacco Use  . Smoking status: Never Smoker  . Smokeless tobacco: Never Used  Substance and Sexual Activity  . Alcohol use: Yes    Alcohol/week: 1.0 standard drinks    Types: 1 Glasses of wine per week    Comment: occasional  . Drug use: No  . Sexual activity: Not Currently    Birth control/protection: Injection

## 2019-08-16 ENCOUNTER — Telehealth: Payer: Self-pay | Admitting: Orthopaedic Surgery

## 2019-08-16 NOTE — Telephone Encounter (Signed)
Were any new medications prescribed at her last visit, or has she been on these before?

## 2019-08-16 NOTE — Telephone Encounter (Signed)
Patient called to let Dr Erlinda Hong know that her hands are shaking really bad and she can not hold anything with them. Patient said she dropped a   Hot cup of coffee on herself. Patient also said her left leg gave out. Patient asked if she need to schedule an appointment with Dr Erlinda Hong. Patient said she is taking Hydrocodone and that maybe causing her problem. The number to contact patient is [phone]

## 2019-08-16 NOTE — Telephone Encounter (Signed)
See message below °

## 2019-08-24 ENCOUNTER — Telehealth: Payer: Self-pay

## 2019-08-24 NOTE — Telephone Encounter (Signed)
Called patient to do their pre-visit COVID screening.  Call went to voicemail. Unable to do prescreening.  

## 2019-08-27 ENCOUNTER — Ambulatory Visit: Payer: 59

## 2019-08-28 NOTE — Telephone Encounter (Signed)
Tried calling patient no answer. Scheduled appt 09/13/2019.

## 2019-09-05 ENCOUNTER — Other Ambulatory Visit: Payer: Self-pay | Admitting: Orthopaedic Surgery

## 2019-09-05 MED FILL — METHOCARBAMOL 750 MG TABS: 750 | 30 days supply | Qty: 60 | Fill #0

## 2019-09-05 MED FILL — HYDROCODON-APAP 7.5-325: 7.5-325 | 5 days supply | Qty: 30 | Fill #0

## 2019-09-13 ENCOUNTER — Ambulatory Visit: Payer: 59 | Admitting: Orthopaedic Surgery

## 2019-10-04 ENCOUNTER — Other Ambulatory Visit: Payer: Self-pay

## 2019-10-04 ENCOUNTER — Ambulatory Visit (INDEPENDENT_AMBULATORY_CARE_PROVIDER_SITE_OTHER): Payer: 59

## 2019-10-04 ENCOUNTER — Ambulatory Visit (INDEPENDENT_AMBULATORY_CARE_PROVIDER_SITE_OTHER): Payer: 59 | Admitting: Orthopaedic Surgery

## 2019-10-04 DIAGNOSIS — Z96652 Presence of left artificial knee joint: Secondary | ICD-10-CM

## 2019-10-04 MED ORDER — TRAMADOL HCL 50 MG PO TABS: 50.0000 mg | ORAL_TABLET | Freq: Every day | ORAL | 0 refills | Status: DC | PRN

## 2019-10-04 MED FILL — traMADol HCL 50 MG TABS: 50 | 15 days supply | Qty: 30 | Fill #0

## 2019-10-04 NOTE — Progress Notes (Signed)
Post-Op Visit Note   Patient: Julie Miranda           Date of Birth: Oct 23, 1961           MRN: [id number] Visit Date: 10/04/2019 PCP: Patient, No Pcp Per   Assessment & Plan:  Chief Complaint:  Chief Complaint  Patient presents with  . Right Knee - Routine Post Op, Pain, Edema   Visit Diagnoses:  1. Status post total left knee replacement     Plan: [name] is 66 days status post left uncemented total knee replacement.  She is doing well.  She is doing her home physical therapy at home.  She is ready to go back to work.  She is still using a cane at times.  She endorses pain along the scar and some medial knee pain.  She is taking Tylenol with partial relief.  She is now able to sleep through the night.  She has some pain with exercises.  Her surgical scar is fully healed.  Range of motion is 10 to 100 degrees.  Stable to varus valgus.  X-rays demonstrate a well-seated total knee replacement without complications.  Work note was provided today for return to work.  I will send in a small supply of tramadol to help with her pain.  She will continue with home exercises.  I like to recheck her in 6 weeks.  Questions encouraged and answered.  Follow-Up Instructions: Return in about 6 weeks (around 11/15/2019).   Orders:  Orders Placed This Encounter  Procedures  . XR Knee 1-2 Views Left   Meds ordered this encounter  Medications  . traMADol (ULTRAM) 50 MG tablet    Sig: Take 1-2 tablets (50-100 mg total) by mouth daily as needed.    Dispense:  30 tablet    Refill:  0    Imaging: XR Knee 1-2 Views Left  Result Date: 10/04/2019 Stable total knee replacement in good alignment.    PMFS History: Patient Active Problem List   Diagnosis Date Noted  . Status post total left knee replacement 07/30/2019  . Primary osteoarthritis of left knee 06/05/2019  . Chronic pain of left knee 06/05/2019  . Carpal tunnel syndrome 04/25/2017  . Cervicalgia 07/30/2016  . Synovitis of shoulder  05/21/2016  . Bilateral shoulder pain 03/08/2016  . Pure hypercholesterolemia 03/03/2016  . Costochondritis 03/01/2016  . Generalized anxiety disorder 03/01/2016  . De Quervain's tenosynovitis, bilateral 07/23/2015  . Neutropenia ([place]) 07/11/2015  . GERD (gastroesophageal reflux disease) 10/30/2013  . Endometrial mass 10/04/2013  . Leukocytopenia, unspecified 09/24/2013  . Dehydration 06/28/2013  . Anemia, unspecified 06/28/2013  . Acute low back pain due to trauma 06/28/2013  . Hypothyroidism 06/27/2013  . Syncope and collapse 06/27/2013  . Arthritis 06/27/2013  . Acute renal failure ([place]) 06/27/2013  . Essential hypertension, benign 06/27/2013  . 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 Chronic lower back pain 06/27/2013  . Hypokalemia 06/27/2013  . Hypertension 06/27/2013   Past Medical History:  Diagnosis Date  . Allergy   . Anemia   . Anxiety   . Arthritis   . Colon polyps    x 3  . Depression   . Family history of adverse reaction to anesthesia    makes mother" loopy"  . Gastric ulcer   . GERD (gastroesophageal reflux disease)   . Graves disease   . Headache   . History of pancytopenia    of unknown etiology   . Hyperlipidemia   . Hypertension   .  Thyroid disease     Family History  Problem Relation Age of Onset  . Diabetes Mother   . Diabetes Maternal Aunt        x 2  . Heart disease Sister   . Pancreatic cancer Sister   . Liver cancer Maternal Aunt   . Colon cancer Neg Hx   . Kidney disease Neg Hx   . Colon polyps Neg Hx   . Esophageal cancer Neg Hx   . Rectal cancer Neg Hx   . Stomach cancer Neg Hx     Past Surgical History:  Procedure Laterality Date  . COLONOSCOPY    . GASTRIC BYPASS    . POLYPECTOMY    . TOTAL KNEE ARTHROPLASTY Left 07/30/2019   Procedure: LEFT TOTAL KNEE ARTHROPLASTY;  Surgeon: Leandrew Koyanagi, MD;  Location: [facility];  Service: Orthopedics;  Laterality: Left;   Social History   Occupational History  . Occupation: Fish farm manager  Tobacco Use  . Smoking status: Never Smoker  . Smokeless tobacco: Never Used  Substance and Sexual Activity  . Alcohol use: Yes    Alcohol/week: 1.0 standard drinks    Types: 1 Glasses of wine per week    Comment: occasional  . Drug use: No  . Sexual activity: Not Currently    Birth control/protection: Injection

## 2019-10-12 ENCOUNTER — Telehealth: Payer: Self-pay | Admitting: Orthopaedic Surgery

## 2019-10-12 NOTE — Telephone Encounter (Signed)
I called and spoke with patient. She needs last work note in chart faxed to disability company due to disability benefits being discontinued.  I explained to patient that I would fax note this afternoon.

## 2019-10-12 NOTE — Telephone Encounter (Signed)
Patient called in requesting updated doctor's note for out of work be faxed to disability at [phone]. Patient phone number is [phone].

## 2019-10-15 NOTE — Telephone Encounter (Signed)
Note faxed per patient request

## 2019-11-13 ENCOUNTER — Telehealth: Payer: Self-pay | Admitting: Orthopaedic Surgery

## 2019-11-13 NOTE — Telephone Encounter (Signed)
Caryl Pina from [facility] [employer] called. She need some clarification on patient's restrictions. Her call back number is [phone]

## 2019-11-13 NOTE — Telephone Encounter (Signed)
Note ready for pick up. Patient aware.

## 2019-11-13 NOTE — Telephone Encounter (Signed)
See other messages

## 2019-11-13 NOTE — Telephone Encounter (Signed)
Caryl Pina with [place] [employer] disability called in said she knows the pt cant  Do No prolonged standing, walking, climbing, crawling, kneeling for another 6 weeks. But they were needing to know the exact limits for lifting, standing , walking, etc.   Please give her a call   [phone]

## 2019-11-13 NOTE — Telephone Encounter (Signed)
Left voicemail.  No lifting more than 20 lbs, stand for more than 1 hr, walk more than 1 mile

## 2019-11-13 NOTE — Telephone Encounter (Signed)
No prolonged standing, walking, climbing, crawling, kneeling for another 6 weeks.

## 2019-11-13 NOTE — Telephone Encounter (Signed)
Patient called.   Her new job needs a note detailing her restrictions and when they will expire.   Call back number: [phone]

## 2019-11-14 NOTE — Telephone Encounter (Signed)
Also note was made.

## 2020-02-25 ENCOUNTER — Ambulatory Visit: Payer: 59 | Admitting: Family Medicine

## 2020-03-19 ENCOUNTER — Ambulatory Visit (INDEPENDENT_AMBULATORY_CARE_PROVIDER_SITE_OTHER): Payer: 59 | Admitting: Orthopaedic Surgery

## 2020-03-19 ENCOUNTER — Ambulatory Visit (INDEPENDENT_AMBULATORY_CARE_PROVIDER_SITE_OTHER): Payer: 59

## 2020-03-19 ENCOUNTER — Other Ambulatory Visit: Payer: Self-pay

## 2020-03-19 ENCOUNTER — Ambulatory Visit: Payer: Self-pay

## 2020-03-19 ENCOUNTER — Encounter: Payer: Self-pay | Admitting: Orthopaedic Surgery

## 2020-03-19 DIAGNOSIS — M25561 Pain in right knee: Secondary | ICD-10-CM

## 2020-03-19 DIAGNOSIS — M25562 Pain in left knee: Secondary | ICD-10-CM

## 2020-03-19 DIAGNOSIS — Z96652 Presence of left artificial knee joint: Secondary | ICD-10-CM

## 2020-03-19 DIAGNOSIS — G8929 Other chronic pain: Secondary | ICD-10-CM | POA: Diagnosis not present

## 2020-03-19 NOTE — Progress Notes (Signed)
Office Visit Note   Patient: Julie Miranda           Date of Birth: 1961/07/29           MRN: [id number] Visit Date: 03/19/2020              Requested by: No referring provider defined for this encounter. PCP: Patient, No Pcp Per   Assessment & Plan: Visit Diagnoses:  1. Chronic pain of right knee   2. Status post total left knee replacement     Plan: Impression is bilateral knee pain due to osteoarthritis as well as chronic pain syndrome.  She has done well from her left knee replacement so far.  I think she still lacks quite a bit of strength in her quadriceps which she knows to work on.  She is also working on losing weight.  Based on our discussion she would like to move forward with the Visco injection for the right knee.  For the left knee she will work on weight loss and strengthening.  She declined physical therapy.  She states Voltaren gel does not help.  Follow-Up Instructions: Return if symptoms worsen or fail to improve.   Orders:  Orders Placed This Encounter  Procedures  . XR KNEE 3 VIEW RIGHT  . XR KNEE 3 VIEW LEFT   No orders of the defined types were placed in this encounter.     Procedures: No procedures performed   Clinical Data: No additional findings.   Subjective: Chief Complaint  Patient presents with  . Right Knee - Pain  . Left Knee - Pain    Julie Miranda is a 59 year old female who comes in for bilateral knee pain.  She is approximately 7 months status post left total knee replacement.  She states that the pain is improved but she still has numbness around the scar and tenderness to touch.  She states that the right knee causes chronic pain is worse with activity and weightbearing.  Denies any numbness and tingling.  Uses IcyHot for the pain as well as Tylenol.   Review of Systems  Constitutional: Negative.   HENT: Negative.   Eyes: Negative.   Respiratory: Negative.   Cardiovascular: Negative.   Endocrine: Negative.   Musculoskeletal:  Negative.   Neurological: Negative.   Hematological: Negative.   Psychiatric/Behavioral: Negative.   All other systems reviewed and are negative.    Objective: Vital Signs: LMP  (LMP Unknown) Comment: pt. states that it has been over 1 year  Physical Exam Vitals and nursing note reviewed.  Constitutional:      Appearance: She is well-developed.  Pulmonary:     Effort: Pulmonary effort is normal.  Skin:    General: Skin is warm.     Capillary Refill: Capillary refill takes less than 2 seconds.  Neurological:     Mental Status: She is alert and oriented to person, place, and time.  Psychiatric:        Behavior: Behavior normal.        Thought Content: Thought content normal.        Judgment: Judgment normal.     Ortho Exam Left knee shows a fully healed surgical scar.  She has excellent range of motion that is painless.  No joint effusion.  No signs of infection.  Right knee shows no joint effusion.  Normal range of motion that is painful.  Collaterals and cruciates are stable. Specialty Comments:  No specialty comments available.  Imaging: XR KNEE 3 VIEW  LEFT  Result Date: 03/19/2020 Stable total knee replacement without complication  XR KNEE 3 VIEW RIGHT  Result Date: 03/19/2020 No significant degenerative changes.    PMFS History: Patient Active Problem List   Diagnosis Date Noted  . Status post total left knee replacement 07/30/2019  . Primary osteoarthritis of left knee 06/05/2019  . Chronic pain of left knee 06/05/2019  . Carpal tunnel syndrome 04/25/2017  . Cervicalgia 07/30/2016  . Synovitis of shoulder 05/21/2016  . Bilateral shoulder pain 03/08/2016  . Pure hypercholesterolemia 03/03/2016  . Costochondritis 03/01/2016  . Generalized anxiety disorder 03/01/2016  . De Quervain's tenosynovitis, bilateral 07/23/2015  . Neutropenia ([place]) 07/11/2015  . GERD (gastroesophageal reflux disease) 10/30/2013  . Endometrial mass 10/04/2013  . Leukocytopenia,  unspecified 09/24/2013  . Dehydration 06/28/2013  . Anemia, unspecified 06/28/2013  . Acute low back pain due to trauma 06/28/2013  . Hypothyroidism 06/27/2013  . Syncope and collapse 06/27/2013  . Arthritis 06/27/2013  . Acute renal failure ([place]) 06/27/2013  . Essential hypertension, benign 06/27/2013  . 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 Chronic lower back pain 06/27/2013  . Hypokalemia 06/27/2013  . Hypertension 06/27/2013   Past Medical History:  Diagnosis Date  . Allergy   . Anemia   . Anxiety   . Arthritis   . Colon polyps    x 3  . Depression   . Family history of adverse reaction to anesthesia    makes mother" loopy"  . Gastric ulcer   . GERD (gastroesophageal reflux disease)   . Graves disease   . Headache   . History of pancytopenia    of unknown etiology   . Hyperlipidemia   . Hypertension   . Thyroid disease     Family History  Problem Relation Age of Onset  . Diabetes Mother   . Diabetes Maternal Aunt        x 2  . Heart disease Sister   . Pancreatic cancer Sister   . Liver cancer Maternal Aunt   . Colon cancer Neg Hx   . Kidney disease Neg Hx   . Colon polyps Neg Hx   . Esophageal cancer Neg Hx   . Rectal cancer Neg Hx   . Stomach cancer Neg Hx     Past Surgical History:  Procedure Laterality Date  . COLONOSCOPY    . GASTRIC BYPASS    . POLYPECTOMY    . TOTAL KNEE ARTHROPLASTY Left 07/30/2019   Procedure: LEFT TOTAL KNEE ARTHROPLASTY;  Surgeon: Leandrew Koyanagi, MD;  Location: [employer];  Service: Orthopedics;  Laterality: Left;   Social History   Occupational History  . Occupation: Chartered certified accountant  Tobacco Use  . Smoking status: Never Smoker  . Smokeless tobacco: Never Used  Substance and Sexual Activity  . Alcohol use: Yes    Alcohol/week: 1.0 standard drinks    Types: 1 Glasses of wine per week    Comment: occasional  . Drug use: No  . Sexual activity: Not Currently    Birth control/protection: Injection

## 2020-03-20 ENCOUNTER — Telehealth: Payer: Self-pay

## 2020-03-20 NOTE — Telephone Encounter (Signed)
Please submit for Right Synvisc injection Dr. Erlinda Hong.

## 2020-03-20 NOTE — Telephone Encounter (Signed)
Noted  

## 2020-03-27 ENCOUNTER — Telehealth: Payer: Self-pay

## 2020-03-27 NOTE — Telephone Encounter (Signed)
Submitted VOB for Durolane, right knee. 

## 2020-03-28 ENCOUNTER — Telehealth: Payer: Self-pay

## 2020-03-28 NOTE — Telephone Encounter (Signed)
Approved, Durolane, right knee. Buy & Bill Must meet deductible first Patient will be responsible for 40% OOP. No Co-pay No PA required

## 2020-04-02 ENCOUNTER — Ambulatory Visit (INDEPENDENT_AMBULATORY_CARE_PROVIDER_SITE_OTHER): Payer: 59 | Admitting: Registered Nurse

## 2020-04-02 ENCOUNTER — Other Ambulatory Visit: Payer: Self-pay

## 2020-04-02 VITALS — BP 127/85 | HR 68 | Temp 97.8°F | Ht 66.0 in | Wt 210.8 lb

## 2020-04-02 DIAGNOSIS — Z1231 Encounter for screening mammogram for malignant neoplasm of breast: Secondary | ICD-10-CM

## 2020-04-02 DIAGNOSIS — M25561 Pain in right knee: Secondary | ICD-10-CM

## 2020-04-02 DIAGNOSIS — Z13228 Encounter for screening for other metabolic disorders: Secondary | ICD-10-CM

## 2020-04-02 DIAGNOSIS — Z13220 Encounter for screening for lipoid disorders: Secondary | ICD-10-CM

## 2020-04-02 DIAGNOSIS — Z130 Encounter for screening for diseases of the blood and blood-forming organs and certain disorders involving the immune mechanism: Secondary | ICD-10-CM | POA: Diagnosis not present

## 2020-04-02 DIAGNOSIS — Z1329 Encounter for screening for other suspected endocrine disorder: Secondary | ICD-10-CM

## 2020-04-02 DIAGNOSIS — R519 Headache, unspecified: Secondary | ICD-10-CM | POA: Diagnosis not present

## 2020-04-02 DIAGNOSIS — G8929 Other chronic pain: Secondary | ICD-10-CM | POA: Diagnosis not present

## 2020-04-02 NOTE — Patient Instructions (Signed)
° ° ° °  If you have lab work done today you will be contacted with your lab results within the next 2 weeks.  If you have not heard from us then please contact us. The fastest way to get your results is to register for My Chart. ° ° °IF you received an x-ray today, you will receive an invoice from [facility]. Please contact [facility] at [phone] with questions or concerns regarding your invoice.  ° °IF you received labwork today, you will receive an invoice from [facility]. Please contact [facility] at [phone] with questions or concerns regarding your invoice.  ° °Our billing staff will not be able to assist you with questions regarding bills from these companies. ° °You will be contacted with the lab results as soon as they are available. The fastest way to get your results is to activate your My Chart account. Instructions are located on the last page of this paperwork. If you have not heard from us regarding the results in 2 weeks, please contact this office. °  ° ° ° °

## 2020-04-03 LAB — CMP14+EGFR
ALT: 19 IU/L (ref 0–32)
AST: 34 IU/L (ref 0–40)
Albumin/Globulin Ratio: 1.9 (ref 1.2–2.2)
Albumin: 4.2 g/dL (ref 3.8–4.9)
Alkaline Phosphatase: 96 IU/L (ref 48–121)
BUN/Creatinine Ratio: 8 — ABNORMAL LOW (ref 9–23)
BUN: 7 mg/dL (ref 6–24)
Bilirubin Total: 0.3 mg/dL (ref 0.0–1.2)
CO2: 24 mmol/L (ref 20–29)
Calcium: 9.3 mg/dL (ref 8.7–10.2)
Chloride: 106 mmol/L (ref 96–106)
Creatinine, Ser: 0.92 mg/dL (ref 0.57–1.00)
GFR calc Af Amer: 79 mL/min/{1.73_m2} (ref >59)
GFR calc non Af Amer: 69 mL/min/{1.73_m2} (ref >59)
Globulin, Total: 2.2 g/dL (ref 1.5–4.5)
Glucose: 73 mg/dL (ref 65–99)
Potassium: 4.3 mmol/L (ref 3.5–5.2)
Sodium: 141 mmol/L (ref 134–144)
Total Protein: 6.4 g/dL (ref 6.0–8.5)

## 2020-04-03 LAB — CBC WITH DIFFERENTIAL/PLATELET
Basophils Absolute: 0.1 10*3/uL (ref 0.0–0.2)
Basos: 2 %
EOS (ABSOLUTE): 0.3 10*3/uL (ref 0.0–0.4)
Eos: 13 %
Hematocrit: 33.3 % — ABNORMAL LOW (ref 34.0–46.6)
Hemoglobin: 10.3 g/dL — ABNORMAL LOW (ref 11.1–15.9)
Immature Grans (Abs): 0 10*3/uL (ref 0.0–0.1)
Immature Granulocytes: 0 %
Lymphocytes Absolute: 1.2 10*3/uL (ref 0.7–3.1)
Lymphs: 43 %
MCH: 26.3 pg — ABNORMAL LOW (ref 26.6–33.0)
MCHC: 30.9 g/dL — ABNORMAL LOW (ref 31.5–35.7)
MCV: 85 fL (ref 79–97)
Monocytes Absolute: 0.2 10*3/uL (ref 0.1–0.9)
Monocytes: 6 %
Neutrophils Absolute: 1 10*3/uL — ABNORMAL LOW (ref 1.4–7.0)
Neutrophils: 36 %
Platelets: 240 10*3/uL (ref 150–450)
RBC: 3.91 x10E6/uL (ref 3.77–5.28)
RDW: 15.2 % (ref 11.7–15.4)
WBC: 2.7 10*3/uL — ABNORMAL LOW (ref 3.4–10.8)

## 2020-04-03 LAB — LIPID PANEL
Chol/HDL Ratio: 3.1 ratio (ref 0.0–4.4)
Cholesterol, Total: 253 mg/dL — ABNORMAL HIGH (ref 100–199)
HDL: 82 mg/dL (ref >39)
LDL Chol Calc (NIH): 145 mg/dL — ABNORMAL HIGH (ref 0–99)
Triglycerides: 149 mg/dL (ref 0–149)
VLDL Cholesterol Cal: 26 mg/dL (ref 5–40)

## 2020-04-03 LAB — TSH: TSH: 16.7 u[IU]/mL — ABNORMAL HIGH (ref 0.450–4.500)

## 2020-04-07 ENCOUNTER — Other Ambulatory Visit: Payer: Self-pay | Admitting: Registered Nurse

## 2020-04-07 DIAGNOSIS — G8929 Other chronic pain: Secondary | ICD-10-CM

## 2020-04-07 DIAGNOSIS — M25562 Pain in left knee: Secondary | ICD-10-CM

## 2020-04-07 DIAGNOSIS — M542 Cervicalgia: Secondary | ICD-10-CM

## 2020-04-07 DIAGNOSIS — F411 Generalized anxiety disorder: Secondary | ICD-10-CM

## 2020-04-07 DIAGNOSIS — M25512 Pain in left shoulder: Secondary | ICD-10-CM

## 2020-04-07 DIAGNOSIS — E7800 Pure hypercholesterolemia, unspecified: Secondary | ICD-10-CM

## 2020-04-07 DIAGNOSIS — I10 Essential (primary) hypertension: Secondary | ICD-10-CM

## 2020-04-07 MED ORDER — CITALOPRAM HYDROBROMIDE 20 MG PO TABS: 20.0000 mg | ORAL_TABLET | Freq: Every day | ORAL | 3 refills | Status: DC

## 2020-04-07 MED ORDER — METHOCARBAMOL 750 MG PO TABS: 750.0000 mg | ORAL_TABLET | Freq: Two times a day (BID) | ORAL | 0 refills | Status: DC | PRN

## 2020-04-07 MED ORDER — BUSPIRONE HCL 10 MG PO TABS: 10.0000 mg | ORAL_TABLET | Freq: Two times a day (BID) | ORAL | 3 refills | Status: DC

## 2020-04-07 MED ORDER — HYDROCHLOROTHIAZIDE 25 MG PO TABS: 25.0000 mg | ORAL_TABLET | Freq: Every day | ORAL | 3 refills | Status: DC

## 2020-04-07 MED ORDER — CYCLOBENZAPRINE HCL 10 MG PO TABS: 10.0000 mg | ORAL_TABLET | Freq: Three times a day (TID) | ORAL | 1 refills | Status: DC | PRN

## 2020-04-07 MED ORDER — GABAPENTIN 100 MG PO CAPS: 300.0000 mg | ORAL_CAPSULE | Freq: Every day | ORAL | 5 refills | Status: DC

## 2020-04-07 MED ORDER — ATORVASTATIN CALCIUM 40 MG PO TABS: 40.0000 mg | ORAL_TABLET | ORAL | 1 refills | Status: DC

## 2020-04-07 MED ORDER — ZOLPIDEM TARTRATE 5 MG PO TABS: 5.0000 mg | ORAL_TABLET | Freq: Every evening | ORAL | 0 refills | Status: DC | PRN

## 2020-04-07 MED ORDER — LOSARTAN POTASSIUM 25 MG PO TABS: 25.0000 mg | ORAL_TABLET | Freq: Every day | ORAL | 1 refills | Status: DC

## 2020-04-07 MED ORDER — LEVOTHYROXINE SODIUM 150 MCG PO TABS: 150.0000 ug | ORAL_TABLET | Freq: Every day | ORAL | 1 refills | Status: DC

## 2020-04-07 MED ORDER — NORTRIPTYLINE HCL 25 MG PO CAPS: 25.0000 mg | ORAL_CAPSULE | Freq: Every day | ORAL | 0 refills | Status: DC

## 2020-04-07 MED FILL — busPIRone HCL 10 MG TABS: 10 | 90 days supply | Qty: 180 | Fill #0

## 2020-04-07 MED FILL — CYCLOBENZAPRINE HCL 10 MG T: 10 | 10 days supply | Qty: 30 | Fill #0

## 2020-04-07 MED FILL — NORTRIPTYLINE HCL 25 MG CAP: 25 | 30 days supply | Qty: 30 | Fill #0

## 2020-04-07 MED FILL — ATORVASTATIN 40 MG TABLET: 40 | 90 days supply | Qty: 45 | Fill #0

## 2020-04-07 MED FILL — LOSARTAN POTASSIUM 25 MG TA: 25 | 90 days supply | Qty: 90 | Fill #0

## 2020-04-07 MED FILL — HYDROCHLOROTHIAZIDE 25 MG T: 25 | 90 days supply | Qty: 90 | Fill #0

## 2020-04-07 MED FILL — CITALOPRAM HBR 20 MG TABLET: 20 | 90 days supply | Qty: 90 | Fill #0

## 2020-04-07 MED FILL — LEVOTHYROXINE SODIUM 150 MC: 150 | 90 days supply | Qty: 90 | Fill #0

## 2020-04-07 MED FILL — GABAPENTIN 100 MG CAPSULE: 100 | 60 days supply | Qty: 180 | Fill #0

## 2020-04-07 NOTE — Telephone Encounter (Signed)
Are either of you aware of a list a medication patient was supposed to have refilled at her office visit. Patient was last seen on 04/03/20

## 2020-04-07 NOTE — Telephone Encounter (Signed)
Patient is in need of this medication she is completely out at the moment   Patient is requesting a refill of the following medications: Requested Prescriptions   Pending Prescriptions Disp Refills  . busPIRone (BUSPAR) 10 MG tablet      Sig: Take 1 tablet (10 mg total) by mouth 2 (two) times daily.    Date of patient request: 04/07/2020 Last office visit: 04/02/2020 Date of last refill: 12/05/2018 Last refill amount:  Follow up time period per chart: 05/14/2020

## 2020-04-07 NOTE — Telephone Encounter (Signed)
Pt had an appt this past Wednesday and Meds where supposed to have been sent to [facility] they are still not there . She is completely out and is out of everything / need to be filled asap  What is the name of the medication?  busPIRone (BUSPAR) 10 MG tablet  Pt refused to call them all off  She cannot remember all of them . Patient gave nurse a list     Have you contacted your pharmacy to request a refill? y  Which pharmacy would you like this sent to? [place], [place] - [address] [facility].  [address] [place] [address]    Patient notified that their request is being sent to the clinical staff for review and that they should receive a call once it is complete. If they do not receive a call within 72 hours they can check with their pharmacy or our office.

## 2020-04-08 ENCOUNTER — Telehealth: Payer: Self-pay | Admitting: Registered Nurse

## 2020-04-08 NOTE — Telephone Encounter (Signed)
Pharmacy stated both methocarbamol (ROBAXIN) 750 MG tablet  And cyclobenzaprine (FLEXERIL) 10 MG tablet Are both muscle relaxers and wanted to clarify if they Pt should be given both/ please advise

## 2020-04-08 NOTE — Telephone Encounter (Signed)
Patient is to have both Robaxin and Flexeril Per [name].

## 2020-04-09 MED FILL — METHOCARBAMOL 750 MG TABS: 750 | 30 days supply | Qty: 60 | Fill #0

## 2020-04-11 ENCOUNTER — Other Ambulatory Visit: Payer: Self-pay

## 2020-04-11 ENCOUNTER — Encounter: Payer: Self-pay | Admitting: Orthopaedic Surgery

## 2020-04-11 ENCOUNTER — Ambulatory Visit (INDEPENDENT_AMBULATORY_CARE_PROVIDER_SITE_OTHER): Payer: 59 | Admitting: Orthopaedic Surgery

## 2020-04-11 VITALS — Ht 66.0 in | Wt 210.0 lb

## 2020-04-11 DIAGNOSIS — M1712 Unilateral primary osteoarthritis, left knee: Secondary | ICD-10-CM

## 2020-04-11 MED ORDER — SODIUM HYALURONATE 60 MG/3ML IX PRSY
60.0000 mg | PREFILLED_SYRINGE | INTRA_ARTICULAR | Status: AC | PRN
  Administered 2020-04-11: 60 mg via INTRA_ARTICULAR

## 2020-04-11 MED FILL — ZOLPIDEM TARTRATE 5 MG TAB: 5 | 30 days supply | Qty: 20 | Fill #0

## 2020-04-11 NOTE — Progress Notes (Signed)
   Procedure Note  Patient: Julie Miranda             Date of Birth: 1960/11/03           MRN: [id number]             Visit Date: 04/11/2020  Procedures: Visit Diagnoses:  1. Primary osteoarthritis of left knee     Large Joint Inj: R knee on 04/11/2020 8:29 AM Indications: pain Details: 22 G needle  Arthrogram: No  Medications: 60 mg Sodium Hyaluronate 60 MG/3ML Outcome: tolerated well, no immediate complications Patient was prepped and draped in the usual sterile fashion.

## 2020-04-16 ENCOUNTER — Encounter: Payer: Self-pay | Admitting: Registered Nurse

## 2020-04-16 ENCOUNTER — Telehealth: Payer: 59 | Admitting: Internal Medicine

## 2020-04-16 NOTE — Progress Notes (Signed)
New Patient Office Visit  Subjective:  Patient ID: Julie Miranda, female    DOB: 01-28-1961  Age: 59 y.o. MRN: [id number]  CC:  Chief Complaint  Patient presents with  . New Patient (Initial Visit)    Pt stated that she needs to get under a provider's care. She also has been having headache x1year but they have been getting worse along with knee pain. She has Lt kneww surgery last yr on 07/29/2020.    HPI Julie Miranda presents for visit to establish care.  Has a  Number of complaints:  Headaches: on and off for around one year. Differ in quality and intensity. No clear causes. Has not had headaches like this before. Denies visual changes, chest pain, shob, doe, injury  Knee pain: chronic dull aching pain for some time. Generally worsening but no acute injury or precipitating event. Some swelling and warmth but no overt symptoms of septic joint.  Due for Pap: requesting referral to Gyn group   Past Medical History:  Diagnosis Date  . Allergy   . Anemia   . Anxiety   . Arthritis   . Colon polyps    x 3  . Depression   . Family history of adverse reaction to anesthesia    makes mother" loopy"  . Gastric ulcer   . GERD (gastroesophageal reflux disease)   . Graves disease   . Headache   . History of pancytopenia    of unknown etiology   . Hyperlipidemia   . Hypertension   . Thyroid disease     Past Surgical History:  Procedure Laterality Date  . COLONOSCOPY    . GASTRIC BYPASS    . POLYPECTOMY    . TOTAL KNEE ARTHROPLASTY Left 07/30/2019   Procedure: LEFT TOTAL KNEE ARTHROPLASTY;  Surgeon: Leandrew Koyanagi, MD;  Location: [facility];  Service: Orthopedics;  Laterality: Left;    Family History  Problem Relation Age of Onset  . Diabetes Mother   . Diabetes Maternal Aunt        x 2  . Heart disease Sister   . Pancreatic cancer Sister   . Liver cancer Maternal Aunt   . Colon cancer Neg Hx   . Kidney disease Neg Hx   . Colon polyps Neg Hx   . Esophageal cancer Neg  Hx   . Rectal cancer Neg Hx   . Stomach cancer Neg Hx     Social History   Socioeconomic History  . Marital status: Single    Spouse name: Not on file  . Number of children: Not on file  . Years of education: Not on file  . Highest education level: Not on file  Occupational History  . Occupation: Chartered certified accountant  Tobacco Use  . Smoking status: Never Smoker  . Smokeless tobacco: Never Used  Substance and Sexual Activity  . Alcohol use: Yes    Alcohol/week: 1.0 standard drink    Types: 1 Glasses of wine per week    Comment: occasional  . Drug use: No  . Sexual activity: Not Currently    Birth control/protection: Injection  Other Topics Concern  . Not on file  Social History Narrative  . Not on file   Social Determinants of Health   Financial Resource Strain:   . Difficulty of Paying Living Expenses:   Food Insecurity:   . Worried About Charity fundraiser in the Last Year:   . [place] in the Last Year:  Transportation Needs:   . Film/video editor (Medical):   Marland Kitchen Lack of Transportation (Non-Medical):   Physical Activity:   . Days of Exercise per Week:   . Minutes of Exercise per Session:   Stress:   . Feeling of Stress :   Social Connections:   . Frequency of Communication with Friends and Family:   . Frequency of Social Gatherings with Friends and Family:   . Attends Religious Services:   . Active Member of Clubs or Organizations:   . Attends Archivist Meetings:   Marland Kitchen Marital Status:   Intimate Partner Violence:   . Fear of Current or Ex-Partner:   . Emotionally Abused:   Marland Kitchen Physically Abused:   . Sexually Abused:     ROS Review of Systems  Constitutional: Negative.   HENT: Negative.   Eyes: Negative.   Respiratory: Negative.   Cardiovascular: Negative.   Gastrointestinal: Negative.   Endocrine: Negative.   Genitourinary: Negative.   Musculoskeletal: Positive for arthralgias (bilat knees). Negative for back pain, gait problem, joint  swelling, myalgias, neck pain and neck stiffness.  Skin: Negative.   Allergic/Immunologic: Negative.   Neurological: Positive for headaches. Negative for dizziness, tremors, seizures, syncope, facial asymmetry, speech difficulty, weakness, light-headedness and numbness.  Hematological: Negative.   Psychiatric/Behavioral: Negative.   All other systems reviewed and are negative.   Objective:   Today's Vitals: BP 127/85 (BP Location: Right Arm, Patient Position: Sitting, Cuff Size: Large)   Pulse 68   Temp 97.8 F (36.6 C) (Temporal)   Ht _0  (1.676 m)   Wt 210 lb 12.8 oz (95.6 kg)   LMP  (LMP Unknown) Comment: pt. states that it has been over 1 year  SpO2 98%   BMI 34.02 kg/m   Physical Exam Vitals and nursing note reviewed.  Constitutional:      General: She is not in acute distress.    Appearance: Normal appearance. She is normal weight. She is not ill-appearing, toxic-appearing or diaphoretic.  Cardiovascular:     Rate and Rhythm: Normal rate and regular rhythm.  Pulmonary:     Effort: Pulmonary effort is normal. No respiratory distress.  Musculoskeletal:        General: Swelling (mild, bilat knees) present. No tenderness, deformity or signs of injury. Normal range of motion.     Right lower leg: No edema.     Left lower leg: No edema.  Skin:    General: Skin is warm and dry.     Capillary Refill: Capillary refill takes less than 2 seconds.     Coloration: Skin is not jaundiced or pale.     Findings: Erythema (mild, bilat knees) present. No bruising, lesion or rash.  Neurological:     General: No focal deficit present.     Mental Status: She is alert and oriented to person, place, and time. Mental status is at baseline.  Psychiatric:        Mood and Affect: Mood normal.        Behavior: Behavior normal.        Thought Content: Thought content normal.        Judgment: Judgment normal.     Assessment & Plan:   Problem List Items Addressed This Visit    None     Visit Diagnoses    Encounter for screening mammogram for malignant neoplasm of breast    -  Primary   Relevant Orders   Ambulatory referral to Gynecology   Screening for  endocrine, metabolic and immunity disorder       Relevant Orders   CBC with Differential (Completed)   CMP14+EGFR (Completed)   TSH (Completed)   Basic metabolic panel   Lipid screening       Relevant Orders   Lipid panel (Completed)   Chronic nonintractable headache, unspecified headache type       Relevant Orders   Ambulatory referral to Neurology   Chronic pain of right knee       Relevant Orders   Ambulatory referral to Pain Clinic      Outpatient Encounter Medications as of 04/02/2020  Medication Sig  . [DISCONTINUED] atorvastatin (LIPITOR) 40 MG tablet Take 40 mg by mouth every other day.  . [DISCONTINUED] busPIRone (BUSPAR) 10 MG tablet Take 10 mg by mouth 2 (two) times daily.   . [DISCONTINUED] citalopram (CELEXA) 20 MG tablet Take 20 mg by mouth daily.  . [DISCONTINUED] cyclobenzaprine (FLEXERIL) 10 MG tablet Take 10 mg by mouth 3 (three) times daily as needed for muscle spasms.  . [DISCONTINUED] gabapentin (NEURONTIN) 100 MG capsule Take 1 capsule (100 mg total) by mouth at bedtime. (Patient taking differently: Take 300 mg by mouth at bedtime. )  . [DISCONTINUED] hydrochlorothiazide (HYDRODIURIL) 25 MG tablet Take 25 mg by mouth daily.  . [DISCONTINUED] levothyroxine (SYNTHROID) 150 MCG tablet Take 150 mcg by mouth daily before breakfast.  . [DISCONTINUED] losartan (COZAAR) 25 MG tablet Take 25 mg by mouth daily.  . [DISCONTINUED] methocarbamol (ROBAXIN) 750 MG tablet TAKE 1 TABLET (750 MG TOTAL) BY MOUTH 2 (TWO) TIMES DAILY AS NEEDED FOR MUSCLE SPASMS.  . [DISCONTINUED] nortriptyline (PAMELOR) 25 MG capsule Take 1 capsule (25 mg total) by mouth at bedtime.  . [DISCONTINUED] zolpidem (AMBIEN) 5 MG tablet Take 1-2 tablets (5-10 mg total) by mouth at bedtime as needed for sleep.  Marland Kitchen aspirin EC 81 MG tablet  Take 1 tablet (81 mg total) by mouth 2 (two) times daily.  Marland Kitchen HYDROcodone-acetaminophen (NORCO) 7.5-325 MG tablet TAKE 1-2 TABLETS BY MOUTH 3 TIMES DAILY AS NEEDED FOR MODERATE PAIN.  Marland Kitchen ketorolac (TORADOL) 10 MG tablet Take 1 tablet (10 mg total) by mouth 2 (two) times daily as needed.  . traMADol (ULTRAM) 50 MG tablet Take 1-2 tablets (50-100 mg total) by mouth daily as needed.   No facility-administered encounter medications on file as of 04/02/2020.    Follow-up: No follow-ups on file.   PLAN  Pt states she has tried a number of treatments for both knee pain and headaches. Would prefer referrals to specialists rather than trying tx through primary care. Denies ortho and PT referrals for knee pain  Will refill meds  Labs collected, will follow up as warranted  Patient encouraged to call clinic with any questions, comments, or concerns.  [name], NP

## 2020-04-22 ENCOUNTER — Other Ambulatory Visit: Payer: Self-pay | Admitting: Registered Nurse

## 2020-04-22 DIAGNOSIS — Z5320 Procedure and treatment not carried out because of patient's decision for unspecified reasons: Secondary | ICD-10-CM

## 2020-04-30 ENCOUNTER — Other Ambulatory Visit: Payer: Self-pay

## 2020-04-30 ENCOUNTER — Ambulatory Visit (INDEPENDENT_AMBULATORY_CARE_PROVIDER_SITE_OTHER): Payer: 59 | Admitting: Neurology

## 2020-04-30 ENCOUNTER — Encounter: Payer: Self-pay | Admitting: Neurology

## 2020-04-30 VITALS — BP 126/86 | HR 79 | Ht 66.0 in | Wt 207.0 lb

## 2020-04-30 DIAGNOSIS — IMO0002 Reserved for concepts with insufficient information to code with codable children: Secondary | ICD-10-CM | POA: Insufficient documentation

## 2020-04-30 DIAGNOSIS — G43709 Chronic migraine without aura, not intractable, without status migrainosus: Secondary | ICD-10-CM | POA: Diagnosis not present

## 2020-04-30 MED ORDER — NORTRIPTYLINE HCL 25 MG PO CAPS
50.0000 mg | ORAL_CAPSULE | Freq: Every day | ORAL | 11 refills | Status: DC
Start: 2020-04-30 — End: 2020-06-13

## 2020-04-30 MED ORDER — RIZATRIPTAN BENZOATE 10 MG PO TBDP: 10.0000 mg | ORAL_TABLET | ORAL | 6 refills | Status: DC | PRN

## 2020-04-30 MED ORDER — ONDANSETRON 4 MG PO TBDP: 4.0000 mg | ORAL_TABLET | Freq: Three times a day (TID) | ORAL | 6 refills | Status: DC | PRN

## 2020-04-30 MED ORDER — TIZANIDINE HCL 4 MG PO TABS
4.0000 mg | ORAL_TABLET | Freq: Four times a day (QID) | ORAL | 6 refills | Status: DC | PRN
Start: 2020-04-30 — End: 2020-07-02

## 2020-04-30 MED FILL — RIZATRIPTAN 10 MG ODT: 10 | 30 days supply | Qty: 15 | Fill #0

## 2020-04-30 MED FILL — ONDANSETRON ODT 4 MG TABLET: 4 | 7 days supply | Qty: 20 | Fill #0

## 2020-04-30 MED FILL — tiZANidine HCL 4 MG TABS: 4 | 7 days supply | Qty: 30 | Fill #0

## 2020-04-30 NOTE — Patient Instructions (Signed)
Maxalt as needed for headache, May combine it with zofran for nause Aleve Tizanidine-muscle relaxant

## 2020-04-30 NOTE — Progress Notes (Signed)
HISTORICAL  Julie Miranda is a 59 year old female, seen in request by her primary care physician nurse practitioner [name], for evaluation of chronic migraine, initial evaluation was April 30, 2020.  She works as a Psychologist, counselling for [employer]  I reviewed and summarized the referring note. She had a history of hypothyroidism, apparently noncompliant with her medications, multiple elevated TSH level, "sometimes I run out of the medication"  She reported lifelong history of migraine headaches, lateralized severe pounding headache with associated light noise sensitivity, nauseous, is usually stay on the right side, occasionally on the left side,  Over the years, she reported gradual worsening migraine headaches, half time of the month, she has to deal with different degree of migraine headaches, she tends to take 2 tablets of Excedrin Migraine, twice a day to help her migraine," sometimes I sleep with migraine, wake up with migraine  She was giving nortriptyline 25 mg as preventive medication, which did help her her sleep better, but she ran out of the medicine, has stopped taking it, not sure about the benefit for migraine  Laboratory evaluation showed normal CBC, CMP, TSH, was significantly elevated at 16.7,  REVIEW OF SYSTEMS: Full 14 system review of systems performed and notable only for as above All other review of systems were negative.  ALLERGIES: Allergies  Allergen Reactions  . Morphine And Related Nausea And Vomiting    HOME MEDICATIONS: Current Outpatient Medications  Medication Sig Dispense Refill  . aspirin-acetaminophen-caffeine (EXCEDRIN MIGRAINE) 250-250-65 MG tablet Take 1 tablet by mouth as needed for headache.    Marland Kitchen atorvastatin (LIPITOR) 40 MG tablet Take 1 tablet (40 mg total) by mouth every other day. 90 tablet 1  . busPIRone (BUSPAR) 10 MG tablet Take 1 tablet (10 mg total) by mouth 2 (two) times daily. 180 tablet 3  . citalopram (CELEXA) 20  MG tablet Take 1 tablet (20 mg total) by mouth daily. 90 tablet 3  . cyclobenzaprine (FLEXERIL) 10 MG tablet Take 1 tablet (10 mg total) by mouth 3 (three) times daily as needed for muscle spasms. 30 tablet 1  . gabapentin (NEURONTIN) 100 MG capsule Take 3 capsules (300 mg total) by mouth at bedtime. 180 capsule 5  . hydrochlorothiazide (HYDRODIURIL) 25 MG tablet Take 1 tablet (25 mg total) by mouth daily. 90 tablet 3  . levothyroxine (SYNTHROID) 150 MCG tablet Take 1 tablet (150 mcg total) by mouth daily before breakfast. 90 tablet 1  . losartan (COZAAR) 25 MG tablet Take 1 tablet (25 mg total) by mouth daily. 90 tablet 1  . zolpidem (AMBIEN) 5 MG tablet Take 1-2 tablets (5-10 mg total) by mouth at bedtime as needed for sleep. 20 tablet 0   No current facility-administered medications for this visit.    PAST MEDICAL HISTORY: Past Medical History:  Diagnosis Date  . Allergy   . Anemia   . Anxiety   . Arthritis   . Colon polyps    x 3  . Depression   . Family history of adverse reaction to anesthesia    makes mother" loopy"  . Gastric ulcer   . GERD (gastroesophageal reflux disease)   . Graves disease   . Headache   . History of pancytopenia    of unknown etiology   . Hyperlipidemia   . Hypertension   . Migraine   . Thyroid disease     PAST SURGICAL HISTORY: Past Surgical History:  Procedure Laterality Date  . COLONOSCOPY    . GASTRIC  BYPASS    . POLYPECTOMY    . TOTAL KNEE ARTHROPLASTY Left 07/30/2019   Procedure: LEFT TOTAL KNEE ARTHROPLASTY;  Surgeon: Leandrew Koyanagi, MD;  Location: [facility];  Service: Orthopedics;  Laterality: Left;    FAMILY HISTORY: Family History  Problem Relation Age of Onset  . Hypertension Mother   . Gout Mother   . Diabetes Maternal Aunt        x 2  . Heart disease Sister   . Pancreatic cancer Sister   . Liver cancer Maternal Aunt   . Stroke Father   . Colon cancer Neg Hx   . Kidney disease Neg Hx   . Colon polyps Neg Hx   . Esophageal  cancer Neg Hx   . Rectal cancer Neg Hx   . Stomach cancer Neg Hx     SOCIAL HISTORY: Social History   Socioeconomic History  . Marital status: Single    Spouse name: Not on file  . Number of children: 0  . Years of education: some college  . Highest education level: Not on file  Occupational History  . Occupation: Chartered certified accountant  Tobacco Use  . Smoking status: Never Smoker  . Smokeless tobacco: Never Used  Substance and Sexual Activity  . Alcohol use: Yes    Alcohol/week: 1.0 standard drink    Types: 1 Glasses of wine per week    Comment: occasional  . Drug use: No  . Sexual activity: Not Currently    Birth control/protection: Injection  Other Topics Concern  . Not on file  Social History Narrative   Lives at home with her mother.   Right-handed.   One cup caffeine per day.   Social Determinants of Health   Financial Resource Strain:   . Difficulty of Paying Living Expenses:   Food Insecurity:   . Worried About Charity fundraiser in the Last Year:   . Arboriculturist in the Last Year:   Transportation Needs:   . Film/video editor (Medical):   Marland Kitchen Lack of Transportation (Non-Medical):   Physical Activity:   . Days of Exercise per Week:   . Minutes of Exercise per Session:   Stress:   . Feeling of Stress :   Social Connections:   . Frequency of Communication with Friends and Family:   . Frequency of Social Gatherings with Friends and Family:   . Attends Religious Services:   . Active Member of Clubs or Organizations:   . Attends Archivist Meetings:   Marland Kitchen Marital Status:   Intimate Partner Violence:   . Fear of Current or Ex-Partner:   . Emotionally Abused:   Marland Kitchen Physically Abused:   . Sexually Abused:      PHYSICAL EXAM   Vitals:   04/30/20 1436  BP: 126/86  Pulse: 79  Weight: 207 lb (93.9 kg)  Height: 5\' 6"  (1.676 m)   Not recorded     Body mass index is 33.41 kg/m.  PHYSICAL EXAMNIATION:  Gen: NAD, conversant, well nourised,  well groomed                     Cardiovascular: Regular rate rhythm, no peripheral edema, warm, nontender. Eyes: Conjunctivae clear without exudates or hemorrhage Neck: Supple, no carotid bruits. Pulmonary: Clear to auscultation bilaterally   NEUROLOGICAL EXAM:  MENTAL STATUS: Speech:    Speech is normal; fluent and spontaneous with normal comprehension.  Cognition:     Orientation to time, place  and person     Normal recent and remote memory     Normal Attention span and concentration     Normal Language, naming, repeating,spontaneous speech     Fund of knowledge   CRANIAL NERVES: CN II: Visual fields are full to confrontation. Pupils are round equal and briskly reactive to light. CN III, IV, VI: extraocular movement are normal. No ptosis. CN V: Facial sensation is intact to light touch CN VII: Face is symmetric with normal eye closure  CN VIII: Hearing is normal to causal conversation. CN IX, X: Phonation is normal. CN XI: Head turning and shoulder shrug are intact  MOTOR: There is no pronator drift of out-stretched arms. Muscle bulk and tone are normal. Muscle strength is normal.  REFLEXES: Reflexes are 2+ and symmetric at the biceps, triceps, knees, and ankles. Plantar responses are flexor.  SENSORY: Intact to light touch, pinprick and vibratory sensation are intact in fingers and toes.  COORDINATION: There is no trunk or limb dysmetria noted.  GAIT/STANCE: She needs push-up to get up from seated position, mildly antalgic, mildly unsteady   DIAGNOSTIC DATA (LABS, IMAGING, TESTING) - I reviewed patient records, labs, notes, testing and imaging myself where available.   ASSESSMENT AND PLAN  Oceane Fosse is a 59 y.o. female   Migraine headaches  Start preventive medication nortriptyline titrating to 25 mg 2 tablets every night  Maxalt as needed, may mix with Zofran, Aleve, tizanidine for prolonged severe headaches    Marcial Pacas, M.D. Ph.D.  [facility] [address], [place], [place] [address] Ph: [phone] Fax: [phone]  CC:  [name], NP

## 2020-05-01 MED FILL — NORTRIPTYLINE HCL 25 MG CAP: 25 | 30 days supply | Qty: 60 | Fill #0

## 2020-05-14 ENCOUNTER — Ambulatory Visit: Payer: 59 | Admitting: Registered Nurse

## 2020-05-14 ENCOUNTER — Other Ambulatory Visit: Payer: Self-pay

## 2020-05-19 ENCOUNTER — Ambulatory Visit: Payer: 59 | Admitting: Registered Nurse

## 2020-05-20 ENCOUNTER — Encounter: Payer: Self-pay | Admitting: Registered Nurse

## 2020-05-30 ENCOUNTER — Other Ambulatory Visit: Payer: Self-pay

## 2020-05-30 ENCOUNTER — Ambulatory Visit (INDEPENDENT_AMBULATORY_CARE_PROVIDER_SITE_OTHER): Payer: 59 | Admitting: Registered Nurse

## 2020-05-30 VITALS — BP 114/75 | HR 88 | Ht 66.0 in | Wt 204.0 lb

## 2020-05-30 DIAGNOSIS — M791 Myalgia, unspecified site: Secondary | ICD-10-CM | POA: Diagnosis not present

## 2020-05-30 DIAGNOSIS — E038 Other specified hypothyroidism: Secondary | ICD-10-CM | POA: Diagnosis not present

## 2020-05-30 NOTE — Patient Instructions (Signed)
° ° ° °  If you have lab work done today you will be contacted with your lab results within the next 2 weeks.  If you have not heard from us then please contact us. The fastest way to get your results is to register for My Chart. ° ° °IF you received an x-ray today, you will receive an invoice from [facility]. Please contact [facility] at [phone] with questions or concerns regarding your invoice.  ° °IF you received labwork today, you will receive an invoice from [facility]. Please contact [facility] at [phone] with questions or concerns regarding your invoice.  ° °Our billing staff will not be able to assist you with questions regarding bills from these companies. ° °You will be contacted with the lab results as soon as they are available. The fastest way to get your results is to activate your My Chart account. Instructions are located on the last page of this paperwork. If you have not heard from us regarding the results in 2 weeks, please contact this office. °  ° ° ° °

## 2020-05-31 LAB — COMPREHENSIVE METABOLIC PANEL
ALT: 11 IU/L (ref 0–32)
AST: 16 IU/L (ref 0–40)
Albumin/Globulin Ratio: 1.7 (ref 1.2–2.2)
Albumin: 4 g/dL (ref 3.8–4.9)
Alkaline Phosphatase: 97 IU/L (ref 48–121)
BUN/Creatinine Ratio: 10 (ref 9–23)
BUN: 8 mg/dL (ref 6–24)
Bilirubin Total: 0.3 mg/dL (ref 0.0–1.2)
CO2: 24 mmol/L (ref 20–29)
Calcium: 9.4 mg/dL (ref 8.7–10.2)
Chloride: 107 mmol/L — ABNORMAL HIGH (ref 96–106)
Creatinine, Ser: 0.81 mg/dL (ref 0.57–1.00)
GFR calc Af Amer: 93 mL/min/{1.73_m2} (ref >59)
GFR calc non Af Amer: 80 mL/min/{1.73_m2} (ref >59)
Globulin, Total: 2.4 g/dL (ref 1.5–4.5)
Glucose: 83 mg/dL (ref 65–99)
Potassium: 4.4 mmol/L (ref 3.5–5.2)
Sodium: 142 mmol/L (ref 134–144)
Total Protein: 6.4 g/dL (ref 6.0–8.5)

## 2020-05-31 LAB — THYROID PANEL WITH TSH
Free Thyroxine Index: 3 (ref 1.2–4.9)
T3 Uptake Ratio: 32 % (ref 24–39)
T4, Total: 9.5 ug/dL (ref 4.5–12.0)
TSH: 0.049 u[IU]/mL — ABNORMAL LOW (ref 0.450–4.500)

## 2020-05-31 LAB — VITAMIN B12: Vitamin B-12: 388 pg/mL (ref 232–1245)

## 2020-05-31 LAB — CK: Total CK: 125 U/L (ref 32–182)

## 2020-05-31 LAB — VITAMIN D 25 HYDROXY (VIT D DEFICIENCY, FRACTURES): Vit D, 25-Hydroxy: 14.3 ng/mL — ABNORMAL LOW (ref 30.0–100.0)

## 2020-06-01 IMAGING — MR MR KNEE*L* W/O CM
4 of 6 series · 21 of 40 positions shown · non-contrast
Comparison: Radiographs 06/01/2019

CLINICAL DATA: Left knee pain and popping and weakness for 3
months.

EXAM:
MRI OF THE LEFT KNEE WITHOUT CONTRAST
TECHNIQUE: Multiplanar, multisequence MR imaging of the knee was performed. No
intravenous contrast was administered.

[Series 3: T2 fat-sat · axial · 4.0mm · 0.50mm/px · z∈[-47,+48]mm · 3 of 24 slices shown (1 of 2)]
[im 5/24]
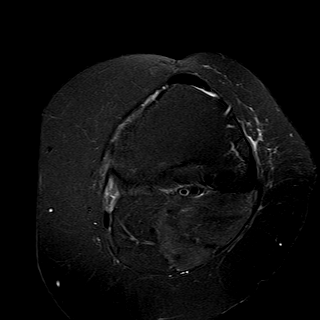
[im 14/24]
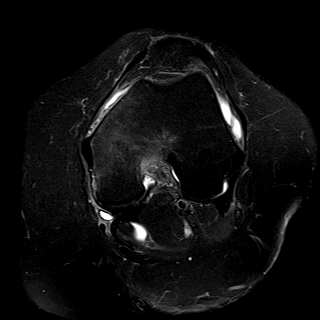
[im 24/24]
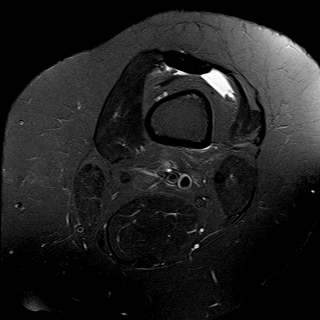

[Series 5: T2 fat-sat · coronal · 4.0mm · 0.29mm/px · 3 of 23 slices shown (2 of 2)]
[im 5/23]
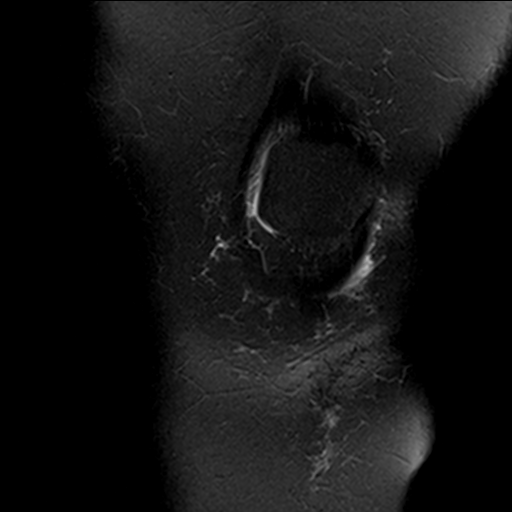
[im 14/23]
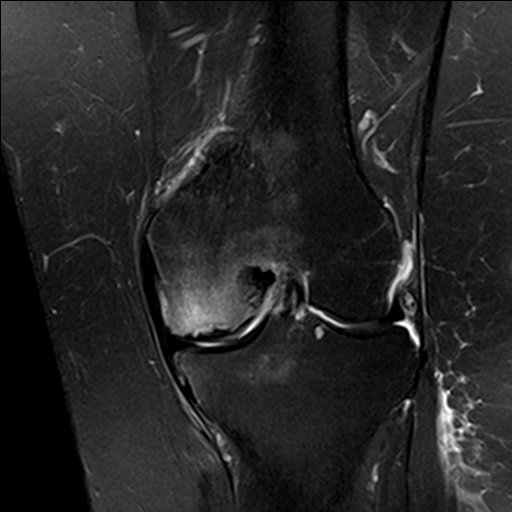
[im 23/23]
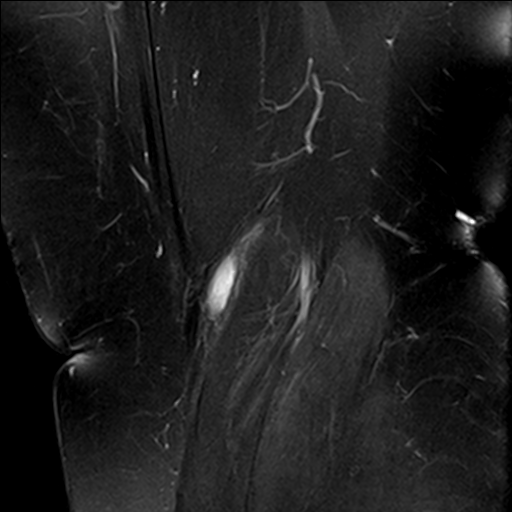

[Series 7: PD fat-sat · sagittal · 3.0mm · 0.29mm/px · 7 of 27 slices shown (1 of 2)]
[im 1/27]
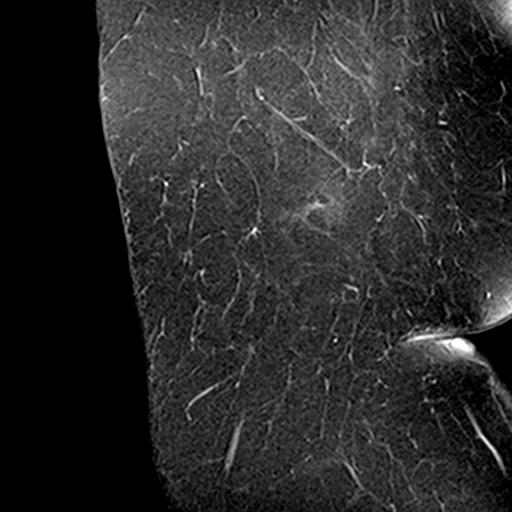
[im 5/27]
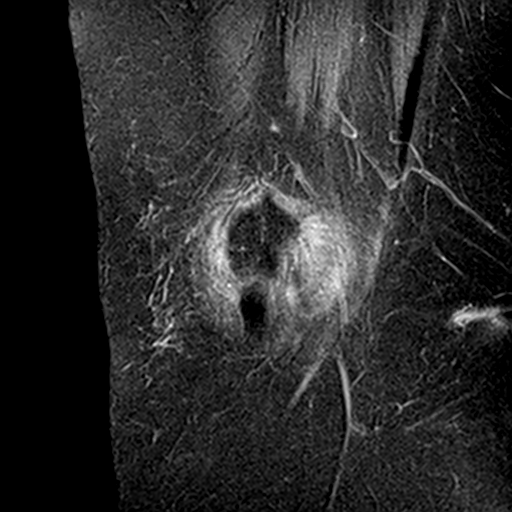
[im 9/27]
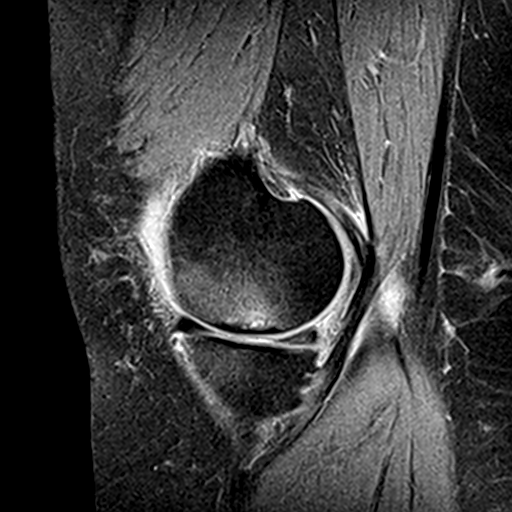
[im 14/27]
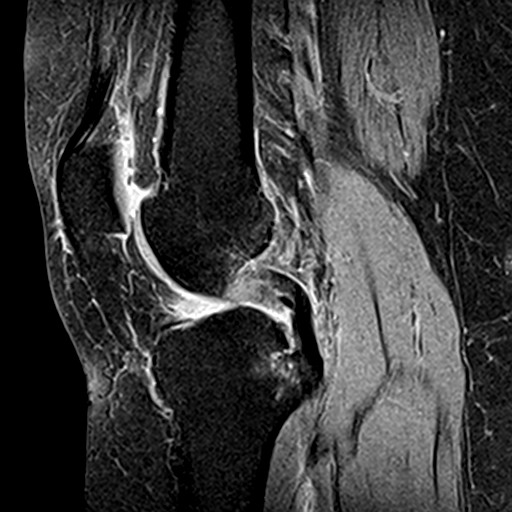
[im 18/27]
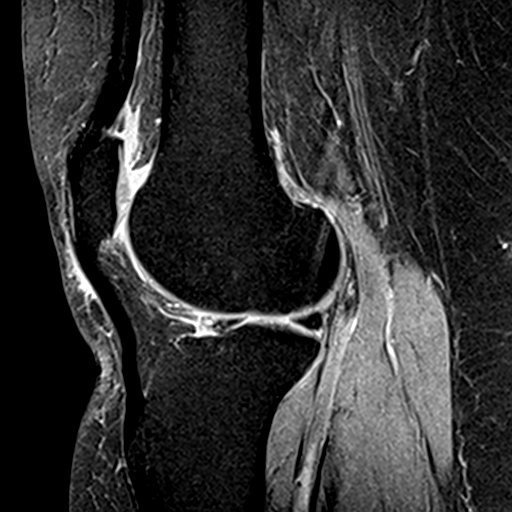
[im 22/27]
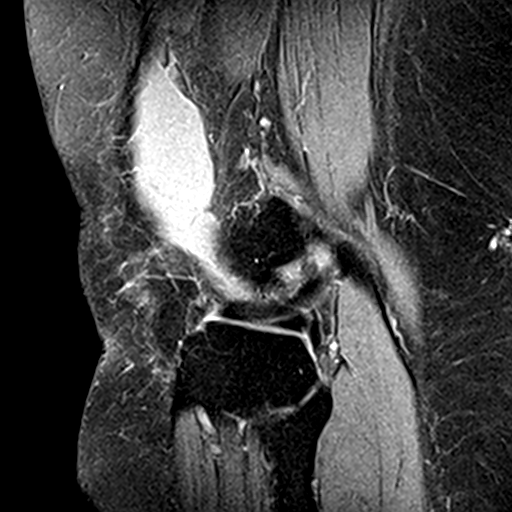
[im 27/27]
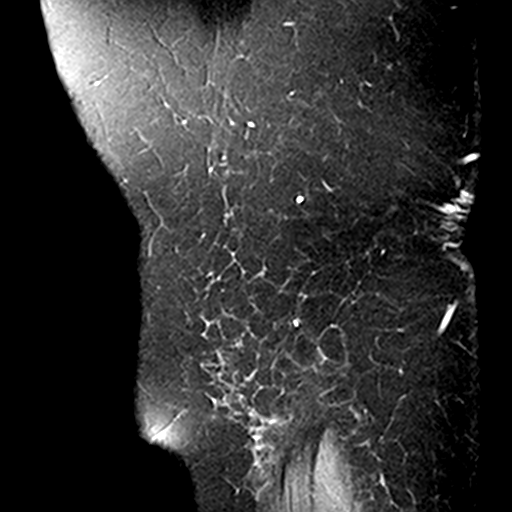

[Series 8: PD fat-sat · coronal · 3.0mm · 0.29mm/px · 8 of 30 slices shown (2 of 2)]
[im 1/30]
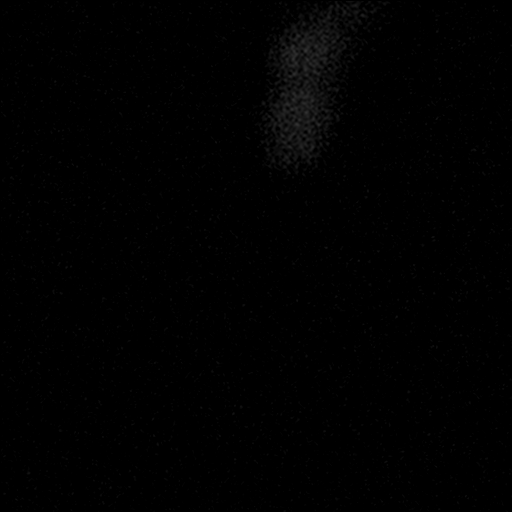
[im 5/30]
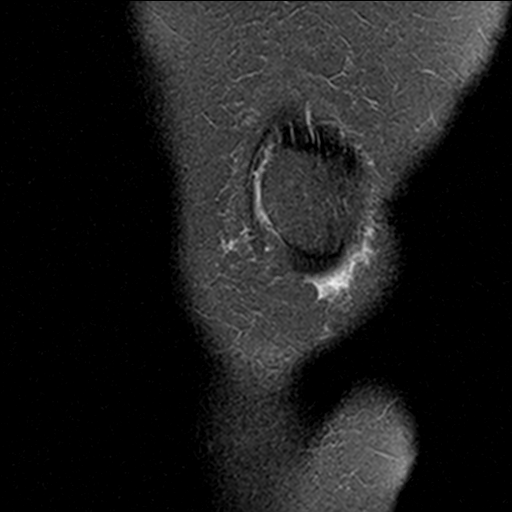
[im 9/30]
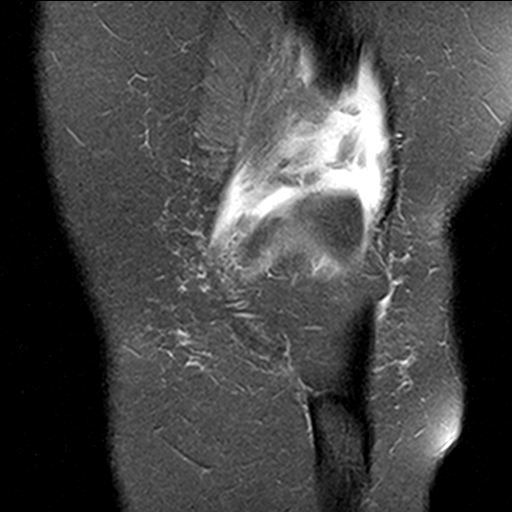
[im 13/30]
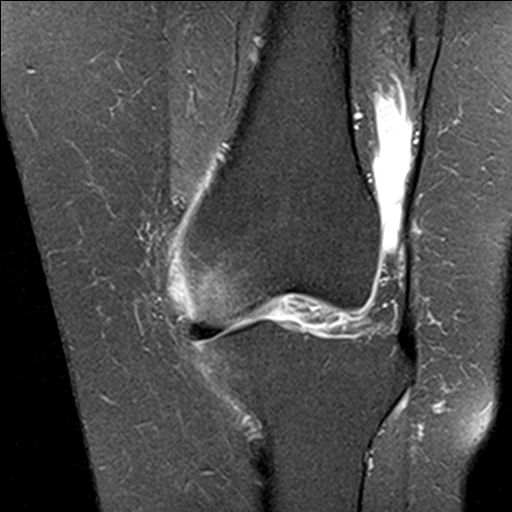
[im 17/30]
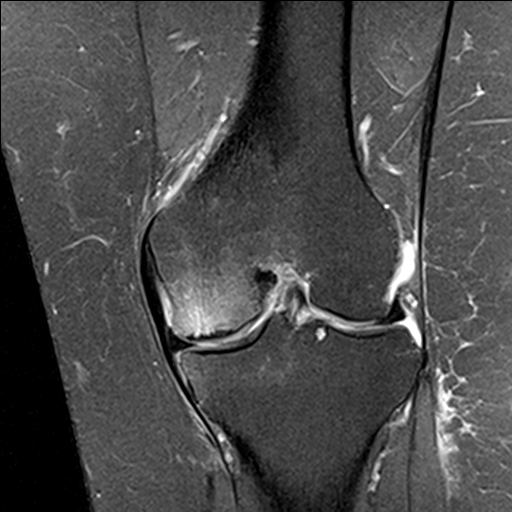
[im 21/30]
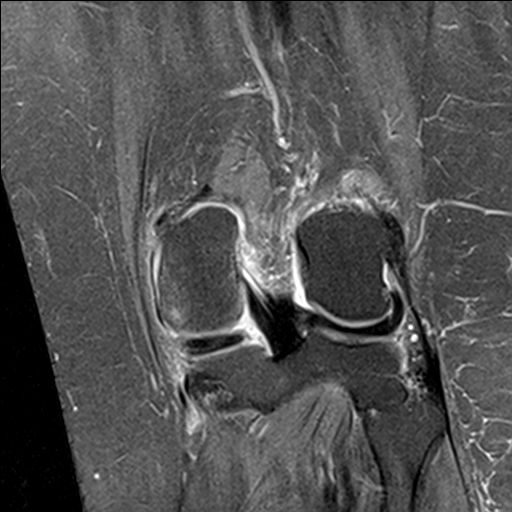
[im 25/30]
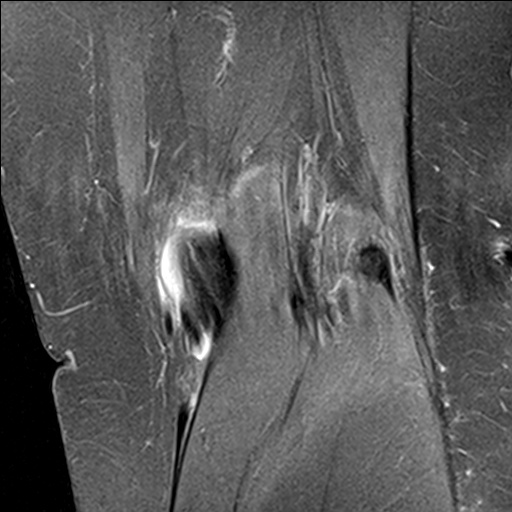
[im 30/30]
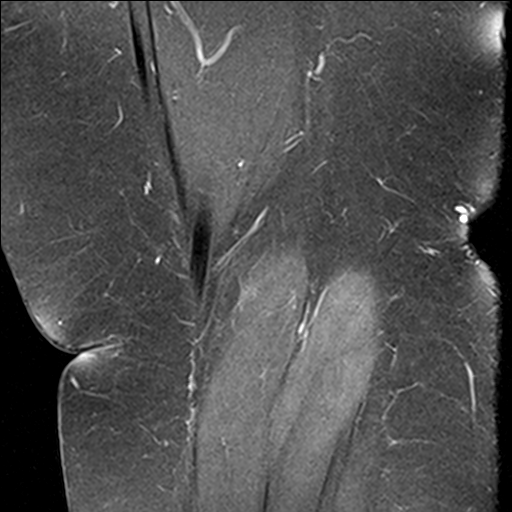

[21 of 40 positions shown; findings below may reference images not displayed]

FINDINGS: MENISCI

Medial meniscus: There is a radial tear involving the posterior horn
with detachment from the meniscal root and associated mild medial
protrusion of the meniscus.

Lateral meniscus: Intact. Degenerative changes involving the
anterior horn.

LIGAMENTS

Cruciates:  Intact

Collaterals:  Intact

CARTILAGE

Patellofemoral:  Degenerative chondrosis along the patellar apex.

Medial: Moderate degenerative chondrosis and associated joint space
narrowing and early spurring.

Lateral:  Mild to moderate degenerative chondrosis.

Joint:  Small joint effusion.

Popliteal Fossa:  Small Baker's cyst.

Extensor Mechanism: The patella retinacular structures are intact
and the quadriceps and patellar tendons are intact.

Bones: Osteochondral lesion involving the medial femoral condyle
with significant surrounding marrow edema. No cartilage defect.

Other: Normal knee musculature.
IMPRESSION: 1. Radial tear involving the posterior horn of the medial meniscus
at the meniscal root.
2. Intact ligamentous structures and no acute bony findings.
3. Osteochondral lesion involving the medial femoral condyle with
surrounding marrow edema.
4. Small joint effusion and small Baker's cyst.
5. Tricompartmental degenerative changes, most significant in the
medial compartment.

## 2020-06-02 ENCOUNTER — Telehealth: Payer: Self-pay

## 2020-06-02 NOTE — Telephone Encounter (Signed)
Pt. Called asserting Mr. Julie Miranda was supposed to refill her Ambien and up another medication for anxiety

## 2020-06-02 NOTE — Telephone Encounter (Signed)
Please Advise patient has some questions about medication.

## 2020-06-09 ENCOUNTER — Encounter: Payer: Self-pay | Admitting: Registered Nurse

## 2020-06-09 ENCOUNTER — Ambulatory Visit: Payer: 59 | Admitting: Pain Medicine

## 2020-06-09 DIAGNOSIS — E559 Vitamin D deficiency, unspecified: Secondary | ICD-10-CM

## 2020-06-09 DIAGNOSIS — E038 Other specified hypothyroidism: Secondary | ICD-10-CM

## 2020-06-09 MED ORDER — VITAMIN D (ERGOCALCIFEROL) 1.25 MG (50000 UNIT) PO CAPS: 50000.0000 [IU] | ORAL_CAPSULE | ORAL | 0 refills | Status: DC

## 2020-06-09 MED ORDER — LEVOTHYROXINE SODIUM 137 MCG PO TABS: 137.0000 ug | ORAL_TABLET | Freq: Every day | ORAL | 0 refills | Status: DC

## 2020-06-09 MED FILL — LEVOTHYROXINE 137 MCG TAB: 137 | 90 days supply | Qty: 90 | Fill #0

## 2020-06-09 MED FILL — VIT D2 1.25 MG (50,000 UNIT: 1.25 MG | 84 days supply | Qty: 12 | Fill #0

## 2020-06-11 ENCOUNTER — Other Ambulatory Visit: Payer: Self-pay | Admitting: Registered Nurse

## 2020-06-11 DIAGNOSIS — F411 Generalized anxiety disorder: Secondary | ICD-10-CM

## 2020-06-11 MED ORDER — CITALOPRAM HYDROBROMIDE 20 MG PO TABS: 20.0000 mg | ORAL_TABLET | Freq: Every day | ORAL | 0 refills | Status: DC

## 2020-06-11 MED ORDER — BUSPIRONE HCL 10 MG PO TABS: 10.0000 mg | ORAL_TABLET | Freq: Two times a day (BID) | ORAL | 0 refills | Status: DC

## 2020-06-11 MED FILL — RIZATRIPTAN 10 MG ODT: 10 | 30 days supply | Qty: 15 | Fill #1

## 2020-06-11 NOTE — Telephone Encounter (Signed)
Copied from [place] [phone]. Topic: Quick Communication - Rx Refill/Question >> Jun 11, 2020 10:18 AM Leward Quan A wrote: Medication: zolpidem (AMBIEN) 5 MG tablet, busPIRone (BUSPAR) 10 MG tablet, citalopram (CELEXA) 20 MG tablet   Has the patient contacted their pharmacy? Yes.   (Agent: If no, request that the patient contact the pharmacy for the refill.) (Agent: If yes, when and what did the pharmacy advise?)  Preferred Pharmacy (with phone number or street name): [place], [place] - [address] [facility].  Phone:  [phone] Fax:  [phone]     Agent: Please be advised that RX refills may take up to 3 business days. We ask that you follow-up with your pharmacy.

## 2020-06-11 NOTE — Telephone Encounter (Signed)
Requested medication (s) are due for refill today: yes  Requested medication (s) are on the active medication list: yes  Last refill:  04/07/2020  Future visit scheduled: yes  Notes to clinic:  this refill cannot be delegated    Requested Prescriptions  Pending Prescriptions Disp Refills   zolpidem (AMBIEN) 5 MG tablet 20 tablet 0    Sig: Take 1-2 tablets (5-10 mg total) by mouth at bedtime as needed for sleep.      Not Delegated - Psychiatry:  Anxiolytics/Hypnotics Failed - 06/11/2020 10:23 AM      Failed - This refill cannot be delegated      Failed - Urine Drug Screen completed in last 360 days.      Passed - Valid encounter within last 6 months    Recent Outpatient Visits           1 week ago Other specified hypothyroidism   [facility], Delfino Lovett, NP   2 months ago Encounter for screening mammogram for malignant neoplasm of breast   [facility], Delfino Lovett, NP   2 years ago Need for influenza vaccination   [place] Charlott Rakes, MD   3 years ago Vaginal discharge   [place], Enobong, MD   3 years ago Other constipation   [place], Charlane Ferretti, MD       Future Appointments             In 2 days Posey Boyer, MD [facility] at [place], [facility]             Signed Prescriptions Disp Refills   busPIRone (BUSPAR) 10 MG tablet 180 tablet 0    Sig: Take 1 tablet (10 mg total) by mouth 2 (two) times daily.      Psychiatry: Anxiolytics/Hypnotics - Non-controlled Passed - 06/11/2020 10:23 AM      Passed - Valid encounter within last 6 months    Recent Outpatient Visits           1 week ago Other specified hypothyroidism   [facility], Delfino Lovett, NP   2 months ago Encounter for screening mammogram for malignant neoplasm of breast   [facility], Delfino Lovett, NP   2 years ago Need for influenza  vaccination   [place] Charlott Rakes, MD   3 years ago Vaginal discharge   [place], Enobong, MD   3 years ago Other constipation   [place], Charlane Ferretti, MD       Future Appointments             In 2 days Posey Boyer, MD [facility] at [place], [facility]              citalopram (CELEXA) 20 MG tablet 90 tablet 0    Sig: Take 1 tablet (20 mg total) by mouth daily.      Psychiatry:  Antidepressants - SSRI Passed - 06/11/2020 10:23 AM      Passed - Valid encounter within last 6 months    Recent Outpatient Visits           1 week ago Other specified hypothyroidism   [facility], Delfino Lovett, NP   2 months ago Encounter for screening mammogram for malignant neoplasm of breast   [facility]  [place], NP   2 years ago Need for influenza vaccination   [place], MD   3 years ago Vaginal discharge   [place], MD   3 years ago Other constipation   [place], Enobong, MD       Future Appointments             In 2 days Linna Darner, Fenton Malling, MD [facility] at [place], [facility]

## 2020-06-13 ENCOUNTER — Encounter: Payer: Self-pay | Admitting: Family Medicine

## 2020-06-13 ENCOUNTER — Ambulatory Visit (INDEPENDENT_AMBULATORY_CARE_PROVIDER_SITE_OTHER): Payer: 59 | Admitting: Family Medicine

## 2020-06-13 ENCOUNTER — Other Ambulatory Visit (HOSPITAL_COMMUNITY)
Admission: RE | Admit: 2020-06-13 | Discharge: 2020-06-13 | Disposition: A | Discharge status: Home / Self Care | Payer: 59 | Source: Ambulatory Visit | Attending: Family Medicine | Admitting: Family Medicine

## 2020-06-13 ENCOUNTER — Other Ambulatory Visit: Payer: Self-pay

## 2020-06-13 VITALS — BP 123/79 | HR 85 | Temp 98.2°F | Ht 66.0 in | Wt 205.0 lb

## 2020-06-13 DIAGNOSIS — I10 Essential (primary) hypertension: Secondary | ICD-10-CM

## 2020-06-13 DIAGNOSIS — F411 Generalized anxiety disorder: Secondary | ICD-10-CM

## 2020-06-13 DIAGNOSIS — G4700 Insomnia, unspecified: Secondary | ICD-10-CM

## 2020-06-13 DIAGNOSIS — Z0000 Encounter for general adult medical examination without abnormal findings: Secondary | ICD-10-CM

## 2020-06-13 DIAGNOSIS — M62838 Other muscle spasm: Secondary | ICD-10-CM | POA: Diagnosis not present

## 2020-06-13 DIAGNOSIS — M25562 Pain in left knee: Secondary | ICD-10-CM | POA: Diagnosis not present

## 2020-06-13 DIAGNOSIS — K219 Gastro-esophageal reflux disease without esophagitis: Secondary | ICD-10-CM

## 2020-06-13 DIAGNOSIS — Z0001 Encounter for general adult medical examination with abnormal findings: Secondary | ICD-10-CM | POA: Diagnosis not present

## 2020-06-13 DIAGNOSIS — Z9884 Bariatric surgery status: Secondary | ICD-10-CM | POA: Diagnosis not present

## 2020-06-13 DIAGNOSIS — E039 Hypothyroidism, unspecified: Secondary | ICD-10-CM

## 2020-06-13 DIAGNOSIS — G8929 Other chronic pain: Secondary | ICD-10-CM

## 2020-06-13 LAB — HM PAP SMEAR

## 2020-06-13 MED ORDER — ZOLPIDEM TARTRATE 5 MG PO TABS: 5.0000 mg | ORAL_TABLET | Freq: Every evening | ORAL | 1 refills | Status: DC | PRN

## 2020-06-13 MED ORDER — CITALOPRAM HYDROBROMIDE 20 MG PO TABS: 30.0000 mg | ORAL_TABLET | Freq: Every day | ORAL | 1 refills | Status: DC

## 2020-06-13 MED ORDER — NORTRIPTYLINE HCL 25 MG PO CAPS: ORAL_CAPSULE | ORAL | 3 refills | Status: DC

## 2020-06-13 MED ORDER — PANTOPRAZOLE SODIUM 20 MG PO TBEC: DELAYED_RELEASE_TABLET | ORAL | 1 refills | Status: DC

## 2020-06-13 MED FILL — NORTRIPTYLINE HCL 25 MG CAP: 25 | 30 days supply | Qty: 60 | Fill #0

## 2020-06-13 MED FILL — PANTOPRAZOLE SOD DR 20 MG T: 20 | 30 days supply | Qty: 30 | Fill #0

## 2020-06-13 NOTE — Patient Instructions (Addendum)
   Continue your current basic medical regimen-  Increase the escitalopram to 30 mg (1-1/2 x 20 mg) daily for anxiety  I represcribed the zolpidem.  Please use this only when you are really needing extra sleep, do not use other daily basis.  Your thyroid labs indicate that you may be getting a tiny bit too much Synthroid, but I think you should wait and get that repeated in a couple of months before making that decision.  Continue trying to get regular exercise and eat less to see if you can lose weight.  Mammogram is being scheduled  We will let you know the results of the Pap test.  Your vaginal opening is so tight that it was difficult to get a perfect examination though everything seems to be normal.  I called in some more nortriptyline 25 mg and you can take 1 or 2 at bedtime if needed for sleep.  I would rather you take only 1 of these at a time.  Do not use with the zolpidem.  Return to see [name], NP in 2 to 3 months or as needed.  If you have lab work done today you will be contacted with your lab results within the next 2 weeks.  If you have not heard from [place] then please contact us. The fastest way to get your results is to register for My Chart.   IF you received an x-ray today, you will receive an invoice from [facility]. Please contact [facility] at [phone] with questions or concerns regarding your invoice.   IF you received labwork today, you will receive an invoice from [place]. Please contact [facility] at [phone] with questions or concerns regarding your invoice.   Our billing staff will not be able to assist you with questions regarding bills from these companies.  You will be contacted with the lab results as soon as they are available. The fastest way to get your results is to activate your My Chart account. Instructions are located on the last page of this paperwork. If you have not heard from [place] regarding the results in 2 weeks,  please contact this office.

## 2020-06-13 NOTE — Progress Notes (Signed)
Patient ID: Julie Miranda, female    DOB: May 02, 1961  Age: 59 y.o. MRN: [id number]  Chief Complaint  Patient presents with  . Annual Exam    w/ pap/ questions about thyroid and anxiety medication increase per last visit    Subjective:   Patient is here for regular examination.  She needs a Pap smear along with her annual physical.  Past medical history: Surgeries: Left TKA, some kind of a gastric bypass procedure in [place]. Illnesses: No major illnesses.  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  Has IBS. Sister had a CVA and pancreatic CA and died  Social history: Lives with her mother and sister.  Moved here from [place].  Single.  Never had children.  She likes to garden and walk.  She plays video games.  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  She is going to college classes.  She is to go to church in [place] which does not have a church in [place].  She likes to watch Durwin Nora.  Review of systems: Constitutional: Unremarkable.  Gets tired after a long day. HEENT: Unremarkable Cardiovascular: Has some little GERD related chest pains GI: Unremarkable except for occasional GERD GU: Unremarkable.  Is menopausal.  Musculoskeletal: Unremarkable Dermatologic: Unremarkable Neurologic: Numbness of her feet Psychiatric: A lot of anxiety issues and would like to go up on her anxiety medication insomnia   Current allergies, medications, problem list, past/family and social histories reviewed.  Objective:  BP 123/79   Pulse 85   Temp 98.2 F (36.8 C)   Ht 5\' 6"  (1.676 m)   Wt 205 lb (93 kg)   LMP  (LMP Unknown) Comment: pt. states that it has been over 1 year  SpO2 95%   BMI 33.09 kg/m   Pleasant lady, fairly obese.  No acute distress.  TMs normal.  Throat clear.  Wears dentures upper and lower.  Eyes PERRL.  Neck supple  without nodes or thyromegaly.  No carotid bruits.  Chest clear to auscultation.  Heart rate without murmurs gallops or arrhythmias.  Breasts are full, pendulous, no masses noted.  No axillary or inguinal nodes.  Abdomen soft without mass or tenderness.  Several old left pleuroscopy type scars.  Pelvic normal external genitalia.  Vaginal introitus is very tight making it very difficult to get a good look.  The speculum could be inserted but could not be separated well.  Pap was taken a little bit blindly.  Bimanual exam done with 1 digit revealed no adnexal masses.  Uterus feels normal.  Pedal pulses are satisfactory.  Assessment & Plan:   Assessment: No diagnosis found.    Plan: See instructions. No orders of the defined types were placed in this encounter.   No orders of the defined types were placed in this encounter.        Patient Instructions       If you have lab work done today you will be contacted with your lab results within the next 2 weeks.  If you have not heard from [place] then please contact us. The fastest way to get your results is to register for My Chart.   IF you received an x-ray today, you will receive an invoice from [facility]. Please contact [facility] at [phone] with questions or concerns regarding your invoice.  IF you received labwork today, you will receive an invoice from [place]. Please contact [facility] at [phone] with questions or concerns regarding your invoice.   Our billing staff will not be able to assist you with questions regarding bills from these companies.  You will be contacted with the lab results as soon as they are available. The fastest way to get your results is to activate your My Chart account. Instructions are located on the last page of this paperwork. If you have not heard from [place] regarding the results in 2 weeks, please contact this office.         No follow-ups on file.   Ruben Reason, MD  06/13/2020

## 2020-06-16 LAB — CYTOLOGY - PAP: Diagnosis: NEGATIVE

## 2020-06-20 MED FILL — CITALOPRAM HBR 20 MG TABLET: 20 | 90 days supply | Qty: 135 | Fill #0

## 2020-06-20 MED FILL — busPIRone HCL 10 MG TABS: 10 | 90 days supply | Qty: 180 | Fill #0

## 2020-06-25 ENCOUNTER — Ambulatory Visit: Payer: 59

## 2020-07-01 ENCOUNTER — Other Ambulatory Visit: Payer: Self-pay | Admitting: Registered Nurse

## 2020-07-01 DIAGNOSIS — F411 Generalized anxiety disorder: Secondary | ICD-10-CM

## 2020-07-01 MED ORDER — ZOLPIDEM TARTRATE 5 MG PO TABS: 5.0000 mg | ORAL_TABLET | Freq: Every evening | ORAL | 0 refills | Status: DC | PRN

## 2020-07-01 MED FILL — ZOLPIDEM TARTRATE 5 MG TAB: 5 | 30 days supply | Qty: 30 | Fill #0

## 2020-07-02 ENCOUNTER — Ambulatory Visit (INDEPENDENT_AMBULATORY_CARE_PROVIDER_SITE_OTHER): Payer: 59 | Admitting: Bariatrics

## 2020-07-02 ENCOUNTER — Other Ambulatory Visit: Payer: Self-pay

## 2020-07-02 ENCOUNTER — Encounter (INDEPENDENT_AMBULATORY_CARE_PROVIDER_SITE_OTHER): Payer: Self-pay | Admitting: Bariatrics

## 2020-07-02 VITALS — BP 140/85 | HR 73 | Temp 98.2°F | Ht 64.0 in | Wt 206.0 lb

## 2020-07-02 DIAGNOSIS — R5383 Other fatigue: Secondary | ICD-10-CM | POA: Diagnosis not present

## 2020-07-02 DIAGNOSIS — I10 Essential (primary) hypertension: Secondary | ICD-10-CM | POA: Diagnosis not present

## 2020-07-02 DIAGNOSIS — E039 Hypothyroidism, unspecified: Secondary | ICD-10-CM

## 2020-07-02 DIAGNOSIS — G8929 Other chronic pain: Secondary | ICD-10-CM

## 2020-07-02 DIAGNOSIS — Z6835 Body mass index (BMI) 35.0-35.9, adult: Secondary | ICD-10-CM

## 2020-07-02 DIAGNOSIS — K5909 Other constipation: Secondary | ICD-10-CM

## 2020-07-02 DIAGNOSIS — R0602 Shortness of breath: Secondary | ICD-10-CM | POA: Diagnosis not present

## 2020-07-02 DIAGNOSIS — R7309 Other abnormal glucose: Secondary | ICD-10-CM | POA: Diagnosis not present

## 2020-07-02 DIAGNOSIS — M545 Low back pain, unspecified: Secondary | ICD-10-CM

## 2020-07-02 DIAGNOSIS — E7800 Pure hypercholesterolemia, unspecified: Secondary | ICD-10-CM

## 2020-07-02 DIAGNOSIS — E559 Vitamin D deficiency, unspecified: Secondary | ICD-10-CM | POA: Diagnosis not present

## 2020-07-02 DIAGNOSIS — Z0289 Encounter for other administrative examinations: Secondary | ICD-10-CM

## 2020-07-02 DIAGNOSIS — E66812 Obesity, class 2: Secondary | ICD-10-CM

## 2020-07-02 DIAGNOSIS — F3289 Other specified depressive episodes: Secondary | ICD-10-CM

## 2020-07-02 DIAGNOSIS — Z9189 Other specified personal risk factors, not elsewhere classified: Secondary | ICD-10-CM | POA: Diagnosis not present

## 2020-07-03 LAB — INSULIN, RANDOM: INSULIN: 8.1 u[IU]/mL (ref 2.6–24.9)

## 2020-07-03 LAB — HEMOGLOBIN A1C
Est. average glucose Bld gHb Est-mCnc: 111 mg/dL
Hgb A1c MFr Bld: 5.5 % (ref 4.8–5.6)

## 2020-07-03 LAB — LIPID PANEL WITH LDL/HDL RATIO
Cholesterol, Total: 216 mg/dL — ABNORMAL HIGH (ref 100–199)
HDL: 79 mg/dL (ref >39)
LDL Chol Calc (NIH): 120 mg/dL — ABNORMAL HIGH (ref 0–99)
LDL/HDL Ratio: 1.5 ratio (ref 0.0–3.2)
Triglycerides: 100 mg/dL (ref 0–149)
VLDL Cholesterol Cal: 17 mg/dL (ref 5–40)

## 2020-07-07 NOTE — Progress Notes (Signed)
Chief Complaint:   OBESITY Julie Miranda (MR# [id number]) is a 59 y.o. female who presents for evaluation and treatment of obesity and related comorbidities. Current BMI is Body mass index is 35.36 kg/m. Julie Miranda has been struggling with her weight for many years and has been unsuccessful in either losing weight, maintaining weight loss, or reaching her healthy weight goal.  Julie Miranda is currently in the action stage of change and ready to dedicate time achieving and maintaining a healthier weight. Julie Miranda is interested in becoming our patient and working on intensive lifestyle modifications including (but not limited to) diet and exercise for weight loss.  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  She craves food at night.  She snacks after dinner.  Julie Miranda's habits were reviewed today and are as follows: Her family eats meals together, she thinks her family will eat healthier with her, her desired weight loss is 68 pounds, she started gaining weight after age 28, her heaviest weight ever was 210 pounds, she craves candy and gum, she snacks frequently in the evenings, she skips breakfast and/or lunch frequently, she is frequently drinking liquids with calories, she sometimes makes poor food choices and she struggles with emotional eating.  Depression Screen Julie Miranda's Food and Mood (modified PHQ-9) score was 12.  Depression screen PHQ 2/9 07/02/2020  Decreased Interest 3  Down, Depressed, Hopeless 2  PHQ - 2 Score 5  Altered sleeping 0  Tired, decreased energy 3  Change in appetite 2  Feeling bad or failure about yourself  1  Trouble concentrating 0  Moving slowly or fidgety/restless 0  Suicidal thoughts 0  PHQ-9 Score 11  Difficult doing work/chores Somewhat difficult  Some recent data might be hidden   Subjective:   1. Other fatigue Julie Miranda admits to daytime somnolence and reports waking up still tired. Patent has a history of symptoms of daytime  fatigue, morning fatigue and morning headache. Julie Miranda generally gets 3 hours of sleep per night, and states that she has poor quality sleep. Snoring is not present. Apneic episodes are not present. Epworth Sleepiness Score is 3.  2. SOB (shortness of breath) on exertion Julie Miranda notes increasing shortness of breath with exercising and seems to be worsening over time with weight gain. She notes getting out of breath sooner with activity than she used to. This has gotten worse recently. Julie Miranda denies shortness of breath at rest or orthopnea.  3. Essential hypertension Review: taking medications as instructed, no medication side effects noted, no chest pain on exertion, no dyspnea on exertion, no swelling of ankles.  She is taking losartan.  Blood pressure is reasonably well-controlled.  BP Readings from Last 3 Encounters:  07/02/20 140/85  06/13/20 123/79  05/30/20 114/75   4. Hypothyroidism, unspecified type Julie Miranda is taking levothyroxine 137 mcg daily.  Dose changed by PCP.  Lab Results  Component Value Date   TSH 0.049 (L) 05/30/2020   5. Vitamin D deficiency Julie Miranda's Vitamin D level was 14.3 on 05/30/2020. She is currently taking prescription vitamin D 50,000 IU each week. She denies nausea, vomiting or muscle weakness.    6. Pure hypercholesterolemia Julie Miranda has hypercholesterolemia and has been trying to improve her cholesterol levels with intensive lifestyle modification including a low saturated fat diet, exercise and weight loss. She denies any chest pain, claudication or myalgias.  She is taking atorvastatin.  Lab Results  Component Value Date   ALT 11 05/30/2020   AST 16 05/30/2020  ALKPHOS 97 05/30/2020   BILITOT 0.3 05/30/2020   Lab Results  Component Value Date   CHOL 216 (H) 07/02/2020   HDL 79 07/02/2020   LDLCALC 120 (H) 07/02/2020   TRIG 100 07/02/2020   CHOLHDL 3.1 04/02/2020   7. Chronic low back pain without sciatica, unspecified back pain laterality She is  taking gabapentin.  She endorses some limitation in activity.  8. Other constipation Julie Miranda has IBS.  She is taking a stool softener as needed.  9. Other depression, with emotional eating Julie Miranda is struggling with emotional eating and using food for comfort to the extent that it is negatively impacting her health. She has been working on behavior modification techniques to help reduce her emotional eating and has been unsuccessful. She shows no sign of suicidal or homicidal ideations.  She is not seeing a therapist.  10. At risk for activity intolerance Julie Miranda is at risk for activity intolerance due to obesity and chronic low back and left knee pain.  Assessment/Plan:   1. Other fatigue Julie Miranda does feel that her weight is causing her energy to be lower than it should be. Fatigue may be related to obesity, depression or many other causes. Labs will be ordered, and in the meanwhile, Julie Miranda will focus on self care including making healthy food choices, increasing physical activity and focusing on stress reduction.  - EKG 12-Lead - Insulin, random - Hemoglobin A1c  2. SOB (shortness of breath) on exertion Julie Miranda does feel that she gets out of breath more easily that she used to when she exercises. Julie Miranda's shortness of breath appears to be obesity related and exercise induced. She has agreed to work on weight loss and gradually increase exercise to treat her exercise induced shortness of breath. Will continue to monitor closely.  3. Essential hypertension Julie Miranda is working on healthy weight loss and exercise to improve blood pressure control. We will watch for signs of hypotension as she continues her lifestyle modifications.  Continue medications.  4. Hypothyroidism, unspecified type Patient with long-standing hypothyroidism, on levothyroxine therapy. She appears euthyroid. Orders and follow up as documented in patient record.  Continue medications.  Counseling . Good thyroid control is  important for overall health. Supratherapeutic thyroid levels are dangerous and will not improve weight loss results. . The correct way to take levothyroxine is fasting, with water, separated by at least 30 minutes from breakfast, and separated by more than 4 hours from calcium, iron, multivitamins, acid reflux medications (PPIs).   5. Vitamin D deficiency Low Vitamin D level contributes to fatigue and are associated with obesity, breast, and colon cancer. She agrees to continue to take prescription Vitamin D @50 ,000 IU every week and will follow-up for routine testing of Vitamin D, at least 2-3 times per year to avoid over-replacement.  6. Pure hypercholesterolemia Cardiovascular risk and specific lipid/LDL goals reviewed.  We discussed several lifestyle modifications today and Kemonie will continue to work on diet, exercise and weight loss efforts. Orders and follow up as documented in patient record.  Continue atorvastatin.  Counseling Intensive lifestyle modifications are the first line treatment for this issue. . Dietary changes: Increase soluble fiber. Decrease simple carbohydrates. . Exercise changes: Moderate to vigorous-intensity aerobic activity 150 minutes per week if tolerated. . Lipid-lowering medications: see documented in medical record. .  - Lipid Panel With LDL/HDL Ratio  7. Chronic low back pain without sciatica, unspecified back pain laterality Gradually increase activities.  8. Other constipation Increase water intake and take Citrucel everyday.  9. Other depression, with emotional eating Patient was referred to Dr. Mallie Mussel, our Bariatric Psychologist, for evaluation due to her elevated PHQ-9 score and significant struggles with emotional eating.  10. At risk for activity intolerance Julie Miranda was given approximately 15 minutes of exercise intolerance counseling today. She is 59 y.o. female and has risk factors exercise intolerance including obesity. We discussed intensive  lifestyle modifications today with an emphasis on specific weight loss instructions and strategies. Julie Miranda will slowly increase activity as tolerated.  Repetitive spaced learning was employed today to elicit superior memory formation and behavioral change.  11. Class 2 severe obesity with serious comorbidity and body mass index (BMI) of 35.0 to 35.9 in adult, unspecified obesity type (HCC) Julie Miranda is currently in the action stage of change and her goal is to continue with weight loss efforts. I recommend Julie Miranda begin the structured treatment plan as follows:  She has agreed to the Category 1 Plan.  She will work on meal planning and intentional eating.  I reviewed labs independently from 05/30/2020 (CMP, vitamin D, B12, glucose, thyroid).  Exercise goals: All adults should avoid inactivity. Some physical activity is better than none, and adults who participate in any amount of physical activity gain some health benefits.   Behavioral modification strategies: increasing lean protein intake, decreasing simple carbohydrates, increasing vegetables, increasing water intake, decreasing eating out, no skipping meals, meal planning and cooking strategies, keeping healthy foods in the home and planning for success.  She was informed of the importance of frequent follow-up visits to maximize her success with intensive lifestyle modifications for her multiple health conditions. She was informed we would discuss her lab results at her next visit unless there is a critical issue that needs to be addressed sooner. Caliann agreed to keep her next visit at the agreed upon time to discuss these results.  Objective:   Blood pressure 140/85, pulse 73, temperature 98.2 F (36.8 C), height 5\' 4"  (1.626 m), weight 206 lb (93.4 kg), SpO2 100 %. Body mass index is 35.36 kg/m.  EKG: Normal sinus rhythm, rate 67 bpm.  Indirect Calorimeter completed today shows a VO2 of 208 and a REE of 1449.  Her calculated basal  metabolic rate is 1610 thus her basal metabolic rate is worse than expected.  General: Cooperative, alert, well developed, in no acute distress. HEENT: Conjunctivae and lids unremarkable. Cardiovascular: Regular rhythm.  Lungs: Normal work of breathing. Neurologic: No focal deficits.   Lab Results  Component Value Date   CREATININE 0.81 05/30/2020   BUN 8 05/30/2020   NA 142 05/30/2020   K 4.4 05/30/2020   CL 107 (H) 05/30/2020   CO2 24 05/30/2020   Lab Results  Component Value Date   ALT 11 05/30/2020   AST 16 05/30/2020   ALKPHOS 97 05/30/2020   BILITOT 0.3 05/30/2020   Lab Results  Component Value Date   HGBA1C 5.5 07/02/2020   HGBA1C 5.2 11/27/2014   Lab Results  Component Value Date   INSULIN 8.1 07/02/2020   Lab Results  Component Value Date   TSH 0.049 (L) 05/30/2020   Lab Results  Component Value Date   CHOL 216 (H) 07/02/2020   HDL 79 07/02/2020   LDLCALC 120 (H) 07/02/2020   TRIG 100 07/02/2020   CHOLHDL 3.1 04/02/2020   Lab Results  Component Value Date   WBC 2.7 (L) 04/02/2020   HGB 10.3 (L) 04/02/2020   HCT 33.3 (L) 04/02/2020   MCV 85 04/02/2020   PLT 240  04/02/2020   Attestation Statements:   Reviewed by clinician on day of visit: allergies, medications, problem list, medical history, surgical history, family history, social history, and previous encounter notes.  I, Water quality scientist, CMA, am acting as Location manager for [employer], DO  I have reviewed the above documentation for accuracy and completeness, and I agree with the above. Jearld Lesch, DO

## 2020-07-08 ENCOUNTER — Encounter (INDEPENDENT_AMBULATORY_CARE_PROVIDER_SITE_OTHER): Payer: Self-pay | Admitting: Bariatrics

## 2020-07-08 DIAGNOSIS — Z6835 Body mass index (BMI) 35.0-35.9, adult: Secondary | ICD-10-CM | POA: Insufficient documentation

## 2020-07-08 HISTORY — DX: Morbid (severe) obesity due to excess calories: E66.01

## 2020-07-16 ENCOUNTER — Ambulatory Visit
Admission: RE | Admit: 2020-07-16 | Discharge: 2020-07-16 | Disposition: A | Discharge status: Home / Self Care | Payer: 59 | Source: Ambulatory Visit | Attending: Family Medicine | Admitting: Family Medicine

## 2020-07-16 ENCOUNTER — Encounter (INDEPENDENT_AMBULATORY_CARE_PROVIDER_SITE_OTHER): Payer: Self-pay | Admitting: Bariatrics

## 2020-07-16 ENCOUNTER — Other Ambulatory Visit: Payer: Self-pay

## 2020-07-16 ENCOUNTER — Ambulatory Visit (INDEPENDENT_AMBULATORY_CARE_PROVIDER_SITE_OTHER): Payer: 59 | Admitting: Bariatrics

## 2020-07-16 VITALS — BP 124/80 | HR 83 | Temp 98.2°F | Ht 64.0 in | Wt 203.0 lb

## 2020-07-16 DIAGNOSIS — Z6834 Body mass index (BMI) 34.0-34.9, adult: Secondary | ICD-10-CM

## 2020-07-16 DIAGNOSIS — Z1231 Encounter for screening mammogram for malignant neoplasm of breast: Secondary | ICD-10-CM | POA: Diagnosis not present

## 2020-07-16 DIAGNOSIS — I10 Essential (primary) hypertension: Secondary | ICD-10-CM

## 2020-07-16 DIAGNOSIS — K5909 Other constipation: Secondary | ICD-10-CM

## 2020-07-16 DIAGNOSIS — E7849 Other hyperlipidemia: Secondary | ICD-10-CM | POA: Diagnosis not present

## 2020-07-16 DIAGNOSIS — E669 Obesity, unspecified: Secondary | ICD-10-CM | POA: Diagnosis not present

## 2020-07-16 DIAGNOSIS — Z0000 Encounter for general adult medical examination without abnormal findings: Secondary | ICD-10-CM

## 2020-07-21 NOTE — Progress Notes (Signed)
Chief Complaint:   OBESITY Julie Miranda is here to discuss her progress with her obesity treatment plan along with follow-up of her obesity related diagnoses. Julie Miranda is on the Category 1 Plan and states she is following her eating plan approximately 35% of the time. Julie Miranda states she is working.  Today's visit was #: 2 Starting weight: 206 lbs Starting date: 07/02/2020 Today's weight: 203 lbs Today's date: 07/16/2020 Total lbs lost to date: 3 lbs Total lbs lost since last in-office visit: 3 lbs  Interim History: Julie Miranda is down 3 pounds since her last visit.  She struggled with constipation and hunger.  She has been more hungry at work.  Subjective:   1. Essential hypertension Review: taking medications as instructed, no medication side effects noted, no chest pain on exertion, no dyspnea on exertion, no swelling of ankles.  She is taking Cozaar.  Controlled.  BP Readings from Last 3 Encounters:  07/16/20 124/80  07/02/20 140/85  06/13/20 123/79   2. Other hyperlipidemia Julie Miranda has hyperlipidemia and has been trying to improve her cholesterol levels with intensive lifestyle modification including a low saturated fat diet, exercise and weight loss. She denies any chest pain, claudication or myalgias.  She is taking Lipitor.  Improved.  Lab Results  Component Value Date   ALT 11 05/30/2020   AST 16 05/30/2020   ALKPHOS 97 05/30/2020   BILITOT 0.3 05/30/2020   Lab Results  Component Value Date   CHOL 216 (H) 07/02/2020   HDL 79 07/02/2020   LDLCALC 120 (H) 07/02/2020   TRIG 100 07/02/2020   CHOLHDL 3.1 04/02/2020   3. Other constipation Julie Miranda is at increased risk for constipation due to inadequate water intake, changes in diet, and/or use of medications such as GLP1 agonists. Julie Miranda denies hard, infrequent stools currently.  She is taking a stool softener.  She has increased protein.  Assessment/Plan:   1. Essential hypertension Julie Miranda is working on healthy weight loss  and exercise to improve blood pressure control. We will watch for signs of hypotension as she continues her lifestyle modifications.  Continue medication.  2. Other hyperlipidemia Cardiovascular risk and specific lipid/LDL goals reviewed.  We discussed several lifestyle modifications today and Julie Miranda will continue to work on diet, exercise and weight loss efforts. Orders and follow up as documented in patient record.  Continue Lipitor.  Eliminate trans fats.  Increase MUFAs.   Counseling Intensive lifestyle modifications are the first line treatment for this issue. . Dietary changes: Increase soluble fiber. Decrease simple carbohydrates. . Exercise changes: Moderate to vigorous-intensity aerobic activity 150 minutes per week if tolerated. . Lipid-lowering medications: see documented in medical record.  3. Other constipation Julie Miranda was given approximately 15 minutes of counseling today regarding prevention of constipation. She was encouraged to increase water and fiber intake.  Increase water intake.  Will take MiraLAX or Citrucel with water.  4. Class 1 obesity with serious comorbidity and body mass index (BMI) of 34.0 to 34.9 in adult, unspecified obesity type Julie Miranda is currently in the action stage of change. As such, her goal is to continue with weight loss efforts. She has agreed to the Category 1 Plan.   She will work on meal planning.  Labs from 07/02/2020 were reviewed independently and with the patient including lipid panel, A1c, and insulin.  Protein equivalent sheet given.  Increase water intake.  Exercise goals: For substantial health benefits, adults should do at least 150 minutes (2 hours and 30 minutes) a week of  moderate-intensity, or 75 minutes (1 hour and 15 minutes) a week of vigorous-intensity aerobic physical activity, or an equivalent combination of moderate- and vigorous-intensity aerobic activity. Aerobic activity should be performed in episodes of at least 10 minutes, and  preferably, it should be spread throughout the week.  Behavioral modification strategies: increasing lean protein intake, decreasing simple carbohydrates, increasing vegetables, increasing water intake, decreasing eating out, no skipping meals, meal planning and cooking strategies, keeping healthy foods in the home and planning for success.  Julie Miranda has agreed to follow-up with our clinic in 3 weeks. She was informed of the importance of frequent follow-up visits to maximize her success with intensive lifestyle modifications for her multiple health conditions.   Objective:   Blood pressure 124/80, pulse 83, temperature 98.2 F (36.8 C), height 5\' 4"  (1.626 m), weight 203 lb (92.1 kg), SpO2 100 %. Body mass index is 34.84 kg/m.  General: Cooperative, alert, well developed, in no acute distress. HEENT: Conjunctivae and lids unremarkable. Cardiovascular: Regular rhythm.  Lungs: Normal work of breathing. Neurologic: No focal deficits.   Lab Results  Component Value Date   CREATININE 0.81 05/30/2020   BUN 8 05/30/2020   NA 142 05/30/2020   K 4.4 05/30/2020   CL 107 (H) 05/30/2020   CO2 24 05/30/2020   Lab Results  Component Value Date   ALT 11 05/30/2020   AST 16 05/30/2020   ALKPHOS 97 05/30/2020   BILITOT 0.3 05/30/2020   Lab Results  Component Value Date   HGBA1C 5.5 07/02/2020   HGBA1C 5.2 11/27/2014   Lab Results  Component Value Date   INSULIN 8.1 07/02/2020   Lab Results  Component Value Date   TSH 0.049 (L) 05/30/2020   Lab Results  Component Value Date   CHOL 216 (H) 07/02/2020   HDL 79 07/02/2020   LDLCALC 120 (H) 07/02/2020   TRIG 100 07/02/2020   CHOLHDL 3.1 04/02/2020   Lab Results  Component Value Date   WBC 2.7 (L) 04/02/2020   HGB 10.3 (L) 04/02/2020   HCT 33.3 (L) 04/02/2020   MCV 85 04/02/2020   PLT 240 04/02/2020   Attestation Statements:   Reviewed by clinician on day of visit: allergies, medications, problem list, medical history,  surgical history, family history, social history, and previous encounter notes.  Time spent on visit including pre-visit chart review and post-visit care and charting was 30 minutes.   I, Water quality scientist, CMA, am acting as Location manager for [employer], DO  I have reviewed the above documentation for accuracy and completeness, and I agree with the above. Jearld Lesch, DO

## 2020-07-22 ENCOUNTER — Encounter: Payer: Self-pay | Admitting: Registered Nurse

## 2020-07-22 ENCOUNTER — Encounter (INDEPENDENT_AMBULATORY_CARE_PROVIDER_SITE_OTHER): Payer: Self-pay | Admitting: Bariatrics

## 2020-07-22 NOTE — Progress Notes (Signed)
Established Patient Office Visit  Subjective:  Patient ID: Julie Miranda, female    DOB: Jun 15, 1961  Age: 59 y.o. MRN: [id number]  CC:  Chief Complaint  Patient presents with  . Gastroesophageal Reflux    needs med refill  . Anxiety    insomnia, headaches- lost sleep med bottle  . muscle cramps in legs    calf and thigh     HPI [name] presents for a number of concerns  GERD: has been on pantoprazole 20mg  PO qd. Good effect usually but has run out. Symptoms recurred. Tries to avoid trigger foods. No vomiting, no melena  Anxiety: has been taking buspar 10mg  PO tid, citalopram 20mg  PO qd, and zolpidem 5mg  PO qhs for sleep. Unfortunately lost her zolpidem bottle. Sleep has been poor and this has been negatively affecting her anxiety.   Muscle cramps: cramping in both thighs and calves. Comes and goes. No acute changes. No hx of injury. Denies sciatic pain. No falls, no clear etiology for her.   Past Medical History:  Diagnosis Date  . Allergy   . Anemia   . Anxiety   . Arthritis   . Back pain   . Chest pain   . Colon polyps    x 3  . Depression   . Edema of both lower extremities   . Family history of adverse reaction to anesthesia    makes mother" loopy"  . Gastric ulcer   . GERD (gastroesophageal reflux disease)   . Graves disease   . Headache   . History of pancytopenia    of unknown etiology   . Hyperlipidemia   . Hypertension   . Hypothyroidism   . IBS (irritable bowel syndrome)   . Joint pain   . Migraine   . Osteoarthritis   . Thyroid disease   . Vitamin D deficiency     Past Surgical History:  Procedure Laterality Date  . COLONOSCOPY    . GASTRIC BYPASS    . JOINT REPLACEMENT N/A    Phreesia 06/12/2020  . POLYPECTOMY    . TOTAL KNEE ARTHROPLASTY Left 07/30/2019   Procedure: LEFT TOTAL KNEE ARTHROPLASTY;  Surgeon: Leandrew Koyanagi, MD;  Location: [facility];  Service: Orthopedics;  Laterality: Left;    Family History  Problem Relation Age  of Onset  . Hypertension Mother   . Gout Mother   . Diabetes Mother   . High Cholesterol Mother   . Anxiety disorder Mother   . Obesity Mother   . Diabetes Maternal Aunt        x 2  . Heart disease Sister   . Pancreatic cancer Sister   . Liver cancer Maternal Aunt   . Stroke Father   . Colon cancer Neg Hx   . Kidney disease Neg Hx   . Colon polyps Neg Hx   . Esophageal cancer Neg Hx   . Rectal cancer Neg Hx   . Stomach cancer Neg Hx     Social History   Socioeconomic History  . Marital status: Single    Spouse name: Not on file  . Number of children: 0  . Years of education: some college  . Highest education level: Not on file  Occupational History  . Occupation: Chartered certified accountant  Tobacco Use  . Smoking status: Never Smoker  . Smokeless tobacco: Never Used  Substance and Sexual Activity  . Alcohol use: Yes    Alcohol/week: 1.0 standard drink    Types: 1  Glasses of wine per week    Comment: occasional  . Drug use: No  . Sexual activity: Not Currently    Birth control/protection: Injection  Other Topics Concern  . Not on file  Social History Narrative   Lives at home with her mother.   Right-handed.   One cup caffeine per day.   Social Determinants of Health   Financial Resource Strain:   . Difficulty of Paying Living Expenses: Not on file  Food Insecurity:   . Worried About Charity fundraiser in the Last Year: Not on file  . Ran Out of Food in the Last Year: Not on file  Transportation Needs:   . Lack of Transportation (Medical): Not on file  . Lack of Transportation (Non-Medical): Not on file  Physical Activity:   . Days of Exercise per Week: Not on file  . Minutes of Exercise per Session: Not on file  Stress:   . Feeling of Stress : Not on file  Social Connections:   . Frequency of Communication with Friends and Family: Not on file  . Frequency of Social Gatherings with Friends and Family: Not on file  . Attends Religious Services: Not on file  . Active  Member of Clubs or Organizations: Not on file  . Attends Archivist Meetings: Not on file  . Marital Status: Not on file  Intimate Partner Violence:   . Fear of Current or Ex-Partner: Not on file  . Emotionally Abused: Not on file  . Physically Abused: Not on file  . Sexually Abused: Not on file    Outpatient Medications Prior to Visit  Medication Sig Dispense Refill  . atorvastatin (LIPITOR) 40 MG tablet Take 1 tablet (40 mg total) by mouth every other day. 90 tablet 1  . gabapentin (NEURONTIN) 100 MG capsule Take 3 capsules (300 mg total) by mouth at bedtime. 180 capsule 5  . losartan (COZAAR) 25 MG tablet Take 1 tablet (25 mg total) by mouth daily. 90 tablet 1  . rizatriptan (MAXALT-MLT) 10 MG disintegrating tablet Take 1 tablet (10 mg total) by mouth as needed. May repeat in 2 hours if needed 15 tablet 6  . aspirin-acetaminophen-caffeine (EXCEDRIN MIGRAINE) 250-250-65 MG tablet Take 1 tablet by mouth as needed for headache.    . busPIRone (BUSPAR) 10 MG tablet Take 1 tablet (10 mg total) by mouth 2 (two) times daily. 180 tablet 3  . citalopram (CELEXA) 20 MG tablet Take 1 tablet (20 mg total) by mouth daily. 90 tablet 3  . cyclobenzaprine (FLEXERIL) 10 MG tablet Take 1 tablet (10 mg total) by mouth 3 (three) times daily as needed for muscle spasms. 30 tablet 1  . hydrochlorothiazide (HYDRODIURIL) 25 MG tablet Take 1 tablet (25 mg total) by mouth daily. 90 tablet 3  . levothyroxine (SYNTHROID) 150 MCG tablet Take 1 tablet (150 mcg total) by mouth daily before breakfast. 90 tablet 1  . nortriptyline (PAMELOR) 25 MG capsule Take 2 capsules (50 mg total) by mouth at bedtime. 60 capsule 11  . ondansetron (ZOFRAN ODT) 4 MG disintegrating tablet Take 1 tablet (4 mg total) by mouth every 8 (eight) hours as needed. 20 tablet 6  . tiZANidine (ZANAFLEX) 4 MG tablet Take 1 tablet (4 mg total) by mouth every 6 (six) hours as needed for muscle spasms. 30 tablet 6  . zolpidem (AMBIEN) 5 MG  tablet Take 1-2 tablets (5-10 mg total) by mouth at bedtime as needed for sleep. 20 tablet 0  No facility-administered medications prior to visit.    Allergies  Allergen Reactions  . Morphine And Related Nausea And Vomiting    ROS Review of Systems Per hpi     Objective:    Physical Exam Vitals and nursing note reviewed.  Constitutional:      General: She is not in acute distress.    Appearance: Normal appearance. She is normal weight. She is not ill-appearing, toxic-appearing or diaphoretic.  Cardiovascular:     Rate and Rhythm: Normal rate and regular rhythm.     Heart sounds: Normal heart sounds. No murmur heard.  No friction rub. No gallop.   Pulmonary:     Effort: Pulmonary effort is normal. No respiratory distress.     Breath sounds: Normal breath sounds. No stridor. No wheezing, rhonchi or rales.  Chest:     Chest wall: No tenderness.  Musculoskeletal:        General: No swelling, tenderness, deformity or signs of injury. Normal range of motion.     Right lower leg: No edema.     Left lower leg: No edema.  Skin:    General: Skin is warm and dry.  Neurological:     General: No focal deficit present.     Mental Status: She is alert and oriented to person, place, and time. Mental status is at baseline.  Psychiatric:        Mood and Affect: Mood normal.        Behavior: Behavior normal.        Thought Content: Thought content normal.        Judgment: Judgment normal.     BP 114/75   Pulse 88   Ht 5\' 6"  (1.676 m)   Wt 204 lb (92.5 kg)   LMP  (LMP Unknown) Comment: pt. states that it has been over 1 year  SpO2 98%   BMI 32.93 kg/m  Wt Readings from Last 3 Encounters:  07/16/20 203 lb (92.1 kg)  07/02/20 206 lb (93.4 kg)  06/13/20 205 lb (93 kg)     Health Maintenance Due  Topic Date Due  . COVID-19 Vaccine (1) Never done  . INFLUENZA VACCINE  05/25/2020    There are no preventive care reminders to display for this patient.  Lab Results   Component Value Date   TSH 0.049 (L) 05/30/2020   Lab Results  Component Value Date   WBC 2.7 (L) 04/02/2020   HGB 10.3 (L) 04/02/2020   HCT 33.3 (L) 04/02/2020   MCV 85 04/02/2020   PLT 240 04/02/2020   Lab Results  Component Value Date   NA 142 05/30/2020   K 4.4 05/30/2020   CO2 24 05/30/2020   GLUCOSE 83 05/30/2020   BUN 8 05/30/2020   CREATININE 0.81 05/30/2020   BILITOT 0.3 05/30/2020   ALKPHOS 97 05/30/2020   AST 16 05/30/2020   ALT 11 05/30/2020   PROT 6.4 05/30/2020   ALBUMIN 4.0 05/30/2020   CALCIUM 9.4 05/30/2020   ANIONGAP 9 07/31/2019   Lab Results  Component Value Date   CHOL 216 (H) 07/02/2020   Lab Results  Component Value Date   HDL 79 07/02/2020   Lab Results  Component Value Date   LDLCALC 120 (H) 07/02/2020   Lab Results  Component Value Date   TRIG 100 07/02/2020   Lab Results  Component Value Date   CHOLHDL 3.1 04/02/2020   Lab Results  Component Value Date   HGBA1C 5.5 07/02/2020  Assessment & Plan:   Problem List Items Addressed This Visit      Endocrine   Hypothyroidism - Primary   Relevant Orders   Thyroid Panel With TSH (Completed)    Other Visit Diagnoses    Myalgia       Relevant Orders   Vitamin D, 25-hydroxy (Completed)   Vitamin B12 (Completed)   Comprehensive metabolic panel (Completed)   CK (Completed)      No orders of the defined types were placed in this encounter.   Follow-up: No follow-ups on file.   PLAN  Will check tsh for chance that dysthyroid state affecting her anxiety and pain  Labs drawn to investigate myalgias  Will follow up on each as warranted  Patient encouraged to call clinic with any questions, comments, or concerns.  [name], NP

## 2020-08-04 MED FILL — LOSARTAN POTASSIUM 25 MG TA: 25 | 90 days supply | Qty: 90 | Fill #1

## 2020-08-06 ENCOUNTER — Ambulatory Visit (INDEPENDENT_AMBULATORY_CARE_PROVIDER_SITE_OTHER): Payer: 59 | Admitting: Bariatrics

## 2020-08-06 ENCOUNTER — Other Ambulatory Visit: Payer: Self-pay

## 2020-08-06 ENCOUNTER — Other Ambulatory Visit (INDEPENDENT_AMBULATORY_CARE_PROVIDER_SITE_OTHER): Payer: Self-pay | Admitting: Bariatrics

## 2020-08-06 ENCOUNTER — Encounter (INDEPENDENT_AMBULATORY_CARE_PROVIDER_SITE_OTHER): Payer: Self-pay | Admitting: Bariatrics

## 2020-08-06 VITALS — BP 128/83 | HR 86 | Temp 98.1°F | Ht 64.0 in | Wt 204.0 lb

## 2020-08-06 DIAGNOSIS — E039 Hypothyroidism, unspecified: Secondary | ICD-10-CM | POA: Diagnosis not present

## 2020-08-06 DIAGNOSIS — Z6835 Body mass index (BMI) 35.0-35.9, adult: Secondary | ICD-10-CM

## 2020-08-06 DIAGNOSIS — Z9189 Other specified personal risk factors, not elsewhere classified: Secondary | ICD-10-CM

## 2020-08-06 DIAGNOSIS — E7849 Other hyperlipidemia: Secondary | ICD-10-CM | POA: Diagnosis not present

## 2020-08-06 MED ORDER — SAXENDA 18 MG/3ML ~~LOC~~ SOPN: 0.6000 mg | PEN_INJECTOR | Freq: Every day | SUBCUTANEOUS | 0 refills | Status: DC

## 2020-08-06 MED ORDER — SAXENDA 18 MG/3ML ~~LOC~~ SOPN: 3.0000 mg | PEN_INJECTOR | Freq: Every day | SUBCUTANEOUS | 0 refills | Status: DC

## 2020-08-06 NOTE — Progress Notes (Signed)
Chief Complaint:   OBESITY Julie Miranda is here to discuss her progress with her obesity treatment plan along with follow-up of her obesity related diagnoses. Julie Miranda is on the Category 1 Plan and states she is following her eating plan approximately 100% of the time. Julie Miranda states she is biking 30 minutes 3 times per week.  Today's visit was #: 3 Starting weight: 206 lbs Starting date: 07/02/2020 Today's weight: 204 lbs Today's date: 08/06/2020 Total lbs lost to date: 2 Total lbs lost since last in-office visit: 0  Interim History: Julie Miranda is up 1 lb since her last visit. She states she is eating more due to hunger.  Subjective:   Hypothyroidism, unspecified type. Thyroid levels are controlled.  Lab Results  Component Value Date   TSH 0.049 (L) 05/30/2020   Other hyperlipidemia. Julie Miranda is taking Lipitor.  Lab Results  Component Value Date   CHOL 216 (H) 07/02/2020   HDL 79 07/02/2020   LDLCALC 120 (H) 07/02/2020   TRIG 100 07/02/2020   CHOLHDL 3.1 04/02/2020   Lab Results  Component Value Date   ALT 11 05/30/2020   AST 16 05/30/2020   ALKPHOS 97 05/30/2020   BILITOT 0.3 05/30/2020   The 10-year ASCVD risk score Mikey Bussing DC Jr., et al., 2013) is: 4.9%   Values used to calculate the score:     Age: 59 years     Sex: Female     Is Non-Hispanic African American: Yes     Diabetic: No     Tobacco smoker: No     Systolic Blood Pressure: 947 mmHg     Is BP treated: Yes     HDL Cholesterol: 79 mg/dL     Total Cholesterol: 216 mg/dL  At risk for heart disease. Julie Miranda is at a higher than average risk for cardiovascular disease due to hyperlipidemia.   Assessment/Plan:   Hypothyroidism, unspecified type. Patient with long-standing hypothyroidism, on levothyroxine therapy. She appears euthyroid. Orders and follow up as documented in patient record. Prescription was given for Synthroid 137 mcg 1 PO daily with breakfast.  Counseling . Good thyroid control is  important for overall health. Supratherapeutic thyroid levels are dangerous and will not improve weight loss results. . The correct way to take levothyroxine is fasting, with water, separated by at least 30 minutes from breakfast, and separated by more than 4 hours from calcium, iron, multivitamins, acid reflux medications (PPIs).   Other hyperlipidemia. Cardiovascular risk and specific lipid/LDL goals reviewed.  We discussed several lifestyle modifications today and Julie Miranda will continue to work on diet, exercise and weight loss efforts. Orders and follow up as documented in patient record. She will continue Lipitor as directed.   Counseling Intensive lifestyle modifications are the first line treatment for this issue. . Dietary changes: Increase soluble fiber. Decrease simple carbohydrates. . Exercise changes: Moderate to vigorous-intensity aerobic activity 150 minutes per week if tolerated. . Lipid-lowering medications: see documented in medical record.  At risk for heart disease. Julie Miranda was given approximately 15 minutes of coronary artery disease prevention counseling today. She is 59 y.o. female and has risk factors for heart disease including obesity. We discussed intensive lifestyle modifications today with an emphasis on specific weight loss instructions and strategies.   Repetitive spaced learning was employed today to elicit superior memory formation and behavioral change.  Class 2 severe obesity with serious comorbidity and body mass index (BMI) of 35.0 to 35.9 in adult, unspecified obesity type ([place]). Prescription was given  for Liraglutide -Weight Management (SAXENDA) 18 MG/3ML SOPN inject 0.6 mg into skin daily, 15 mL with 0 refills - needs pre-authorization.  Julie Miranda is currently in the action stage of change. As such, her goal is to continue with weight loss efforts. She has agreed to the Category 1 Plan.   She will work on meal planning and intentional eating.   Exercise goals:  All adults should avoid inactivity. Some physical activity is better than none, and adults who participate in any amount of physical activity gain some health benefits.  Behavioral modification strategies: increasing lean protein intake, decreasing simple carbohydrates, increasing vegetables, increasing water intake, decreasing eating out, no skipping meals, meal planning and cooking strategies, keeping healthy foods in the home and planning for success.  Julie Miranda has agreed to follow-up with our clinic in 3 weeks. She was informed of the importance of frequent follow-up visits to maximize her success with intensive lifestyle modifications for her multiple health conditions.   Objective:   Blood pressure 128/83, pulse 86, temperature 98.1 F (36.7 C), height 5\' 4"  (1.626 m), weight 204 lb (92.5 kg), SpO2 97 %. Body mass index is 35.02 kg/m.  General: Cooperative, alert, well developed, in no acute distress. HEENT: Conjunctivae and lids unremarkable. Cardiovascular: Regular rhythm.  Lungs: Normal work of breathing. Neurologic: No focal deficits.   Lab Results  Component Value Date   CREATININE 0.81 05/30/2020   BUN 8 05/30/2020   NA 142 05/30/2020   K 4.4 05/30/2020   CL 107 (H) 05/30/2020   CO2 24 05/30/2020   Lab Results  Component Value Date   ALT 11 05/30/2020   AST 16 05/30/2020   ALKPHOS 97 05/30/2020   BILITOT 0.3 05/30/2020   Lab Results  Component Value Date   HGBA1C 5.5 07/02/2020   HGBA1C 5.2 11/27/2014   Lab Results  Component Value Date   INSULIN 8.1 07/02/2020   Lab Results  Component Value Date   TSH 0.049 (L) 05/30/2020   Lab Results  Component Value Date   CHOL 216 (H) 07/02/2020   HDL 79 07/02/2020   LDLCALC 120 (H) 07/02/2020   TRIG 100 07/02/2020   CHOLHDL 3.1 04/02/2020   Lab Results  Component Value Date   WBC 2.7 (L) 04/02/2020   HGB 10.3 (L) 04/02/2020   HCT 33.3 (L) 04/02/2020   MCV 85 04/02/2020   PLT 240 04/02/2020   No results  found for: IRON, TIBC, FERRITIN  Attestation Statements:   Reviewed by clinician on day of visit: allergies, medications, problem list, medical history, surgical history, family history, social history, and previous encounter notes.  Migdalia Dk, am acting as Location manager for [employer], DO   I have reviewed the above documentation for accuracy and completeness, and I agree with the above. Jearld Lesch, DO

## 2020-08-07 ENCOUNTER — Encounter (INDEPENDENT_AMBULATORY_CARE_PROVIDER_SITE_OTHER): Payer: Self-pay | Admitting: Bariatrics

## 2020-08-13 DIAGNOSIS — H52223 Regular astigmatism, bilateral: Secondary | ICD-10-CM | POA: Diagnosis not present

## 2020-08-13 DIAGNOSIS — H5212 Myopia, left eye: Secondary | ICD-10-CM | POA: Diagnosis not present

## 2020-08-14 MED FILL — SAXENDA 18 MG/3 ML PEN: 18 | 30 days supply | Qty: 15 | Fill #0

## 2020-08-18 ENCOUNTER — Encounter (INDEPENDENT_AMBULATORY_CARE_PROVIDER_SITE_OTHER): Payer: Self-pay

## 2020-08-25 ENCOUNTER — Other Ambulatory Visit (INDEPENDENT_AMBULATORY_CARE_PROVIDER_SITE_OTHER): Payer: Self-pay

## 2020-08-25 ENCOUNTER — Other Ambulatory Visit (INDEPENDENT_AMBULATORY_CARE_PROVIDER_SITE_OTHER): Payer: Self-pay | Admitting: Bariatrics

## 2020-08-25 ENCOUNTER — Telehealth (INDEPENDENT_AMBULATORY_CARE_PROVIDER_SITE_OTHER): Payer: Self-pay | Admitting: Bariatrics

## 2020-08-25 DIAGNOSIS — E669 Obesity, unspecified: Secondary | ICD-10-CM

## 2020-08-25 DIAGNOSIS — Z6834 Body mass index (BMI) 34.0-34.9, adult: Secondary | ICD-10-CM

## 2020-08-25 MED ORDER — BD PEN NEEDLE NANO 2ND GEN 32G X 4 MM MISC: 0 refills | Status: DC

## 2020-08-25 MED FILL — UNIFINE PENTIPS 32GX5/32: 32G X 4 MM | 90 days supply | Qty: 100 | Fill #0

## 2020-08-25 NOTE — Telephone Encounter (Signed)
Needs needles to use with her injection saxenda called into the pharmacy.

## 2020-08-25 NOTE — Telephone Encounter (Signed)
Last seen by Dr. Brown. 

## 2020-08-25 NOTE — Telephone Encounter (Signed)
RX sent

## 2020-08-28 ENCOUNTER — Ambulatory Visit (INDEPENDENT_AMBULATORY_CARE_PROVIDER_SITE_OTHER): Payer: 59 | Admitting: Bariatrics

## 2020-09-03 ENCOUNTER — Encounter (INDEPENDENT_AMBULATORY_CARE_PROVIDER_SITE_OTHER): Payer: Self-pay | Admitting: Family Medicine

## 2020-09-03 ENCOUNTER — Other Ambulatory Visit (INDEPENDENT_AMBULATORY_CARE_PROVIDER_SITE_OTHER): Payer: Self-pay | Admitting: Family Medicine

## 2020-09-03 ENCOUNTER — Ambulatory Visit (INDEPENDENT_AMBULATORY_CARE_PROVIDER_SITE_OTHER): Payer: 59 | Admitting: Family Medicine

## 2020-09-03 ENCOUNTER — Other Ambulatory Visit: Payer: Self-pay

## 2020-09-03 VITALS — BP 117/80 | HR 93 | Temp 98.2°F | Ht 64.0 in | Wt 198.0 lb

## 2020-09-03 DIAGNOSIS — E559 Vitamin D deficiency, unspecified: Secondary | ICD-10-CM

## 2020-09-03 DIAGNOSIS — E7849 Other hyperlipidemia: Secondary | ICD-10-CM

## 2020-09-03 DIAGNOSIS — E669 Obesity, unspecified: Secondary | ICD-10-CM

## 2020-09-03 DIAGNOSIS — Z9189 Other specified personal risk factors, not elsewhere classified: Secondary | ICD-10-CM | POA: Diagnosis not present

## 2020-09-03 DIAGNOSIS — Z6834 Body mass index (BMI) 34.0-34.9, adult: Secondary | ICD-10-CM

## 2020-09-03 MED ORDER — BD PEN NEEDLE NANO 2ND GEN 32G X 4 MM MISC: 1.0000 | Freq: Two times a day (BID) | 0 refills | Status: DC

## 2020-09-03 MED ORDER — VITAMIN D (ERGOCALCIFEROL) 1.25 MG (50000 UNIT) PO CAPS: 50000.0000 [IU] | ORAL_CAPSULE | ORAL | 0 refills | Status: DC

## 2020-09-03 MED ORDER — SAXENDA 18 MG/3ML ~~LOC~~ SOPN: 3.0000 mg | PEN_INJECTOR | Freq: Every day | SUBCUTANEOUS | 0 refills | Status: DC

## 2020-09-03 MED FILL — VIT D2 1.25 MG (50,000 UNIT: 1.25 MG | 84 days supply | Qty: 12 | Fill #0

## 2020-09-04 NOTE — Progress Notes (Signed)
Chief Complaint:   OBESITY Julie Miranda is here to discuss her progress with her obesity treatment plan along with follow-up of her obesity related diagnoses. Julie Miranda is on the Category 1 Plan and states she is following her eating plan approximately 80% of the time. Julie Miranda states she is walking for 30 minutes 7 times per week, and riding the stationary bike for 40-60 minutes 3 times per week.  Today's visit was #: 4 Starting weight: 206 lbs Starting date: 07/02/2020 Today's weight: 198 lbs Today's date: 09/03/2020 Total lbs lost to date: 8 Total lbs lost since last in-office visit: 6  Interim History: Julie Miranda voices that she enjoys the fact that she can eat protein, but she is still struggling with snack quantity and type. She is missing out on snacks.  Subjective:   1. Vitamin D deficiency Julie Miranda denies nausea, vomiting, or muscle weakness, but she notes fatigue. She is on prescription Vit D.  2. Other hyperlipidemia Julie Miranda's last LDL was 121, HDL 79, and triglycerides 100. She is on Lipitor.  3. At risk for side effect of medication Julie Miranda is at risk for drug side effects due to being on the maximum dose of Saxenda.  Assessment/Plan:   1. Vitamin D deficiency Low Vitamin D level contributes to fatigue and are associated with obesity, breast, and colon cancer. We will refill prescription Vitamin D for 1 month. Bentlee will follow-up for routine testing of Vitamin D, at least 2-3 times per year to avoid over-replacement.  - Vitamin D, Ergocalciferol, (DRISDOL) 1.25 MG (50000 UNIT) CAPS capsule; Take 1 capsule (50,000 Units total) by mouth every 7 (seven) days.  Dispense: 12 capsule; Refill: 0  2. Other hyperlipidemia Cardiovascular risk and specific lipid/LDL goals reviewed. We discussed several lifestyle modifications today and Laury will continue to work on diet, exercise and weight loss efforts. Cheyna will continue Lipitor, and we will repeat labs in January 2022. Orders and  follow up as documented in patient record.   Counseling Intensive lifestyle modifications are the first line treatment for this issue. . Dietary changes: Increase soluble fiber. Decrease simple carbohydrates. . Exercise changes: Moderate to vigorous-intensity aerobic activity 150 minutes per week if tolerated. . Lipid-lowering medications: see documented in medical record.  3. At risk for side effect of medication Julie Miranda was given approximately 15 minutes of drug side effect counseling today.  We discussed side effect possibility and risk versus benefits. Julie Miranda agreed to the medication and will contact this office if these side effects are intolerable.  Repetitive spaced learning was employed today to elicit superior memory formation and behavioral change.  4. Class 1 obesity with serious comorbidity and body mass index (BMI) of 34.0 to 34.9 in adult, unspecified obesity type [name] is currently in the action stage of change. As such, her goal is to continue with weight loss efforts. She has agreed to keeping a food journal and adhering to recommended goals of 1000-1100 calories and 75+ grams of protein daily.   We discussed various medication options to help Julie Miranda with her weight loss efforts and we both agreed to continue Saxenda and we will refill for 1 month, and we will refill nano needles #100 with no refills.  - Liraglutide -Weight Management (SAXENDA) 18 MG/3ML SOPN; Inject 3 mg into the skin daily.  Dispense: 15 mL; Refill: 0 - Insulin Pen Needle (BD PEN NEEDLE NANO 2ND GEN) 32G X 4 MM MISC; 1 Package by Does not apply route 2 (two) times daily.  Dispense: 100  each; Refill: 0  Exercise goals: As is.  Behavioral modification strategies: increasing lean protein intake, meal planning and cooking strategies, keeping healthy foods in the home, holiday eating strategies  and keeping a strict food journal.  Julie Miranda has agreed to follow-up with our clinic in 2 weeks. She was informed of  the importance of frequent follow-up visits to maximize her success with intensive lifestyle modifications for her multiple health conditions.   Objective:   Blood pressure 117/80, pulse 93, temperature 98.2 F (36.8 C), temperature source Oral, height 5\' 4"  (1.626 m), weight 198 lb (89.8 kg), SpO2 99 %. Body mass index is 33.99 kg/m.  General: Cooperative, alert, well developed, in no acute distress. HEENT: Conjunctivae and lids unremarkable. Cardiovascular: Regular rhythm.  Lungs: Normal work of breathing. Neurologic: No focal deficits.   Lab Results  Component Value Date   CREATININE 0.81 05/30/2020   BUN 8 05/30/2020   NA 142 05/30/2020   K 4.4 05/30/2020   CL 107 (H) 05/30/2020   CO2 24 05/30/2020   Lab Results  Component Value Date   ALT 11 05/30/2020   AST 16 05/30/2020   ALKPHOS 97 05/30/2020   BILITOT 0.3 05/30/2020   Lab Results  Component Value Date   HGBA1C 5.5 07/02/2020   HGBA1C 5.2 11/27/2014   Lab Results  Component Value Date   INSULIN 8.1 07/02/2020   Lab Results  Component Value Date   TSH 0.049 (L) 05/30/2020   Lab Results  Component Value Date   CHOL 216 (H) 07/02/2020   HDL 79 07/02/2020   LDLCALC 120 (H) 07/02/2020   TRIG 100 07/02/2020   CHOLHDL 3.1 04/02/2020   Lab Results  Component Value Date   WBC 2.7 (L) 04/02/2020   HGB 10.3 (L) 04/02/2020   HCT 33.3 (L) 04/02/2020   MCV 85 04/02/2020   PLT 240 04/02/2020   No results found for: IRON, TIBC, FERRITIN  Attestation Statements:   Reviewed by clinician on day of visit: allergies, medications, problem list, medical history, surgical history, family history, social history, and previous encounter notes.   I, Trixie Dredge, am acting as transcriptionist for Coralie Common, MD.  I have reviewed the above documentation for accuracy and completeness, and I agree with the above. - Jinny Blossom, MD

## 2020-09-05 ENCOUNTER — Ambulatory Visit: Payer: 59 | Admitting: Registered Nurse

## 2020-09-10 ENCOUNTER — Ambulatory Visit: Payer: 59 | Admitting: Registered Nurse

## 2020-09-24 ENCOUNTER — Other Ambulatory Visit: Payer: Self-pay

## 2020-09-24 ENCOUNTER — Ambulatory Visit (INDEPENDENT_AMBULATORY_CARE_PROVIDER_SITE_OTHER): Payer: 59 | Admitting: Bariatrics

## 2020-09-24 ENCOUNTER — Encounter (INDEPENDENT_AMBULATORY_CARE_PROVIDER_SITE_OTHER): Payer: Self-pay | Admitting: Bariatrics

## 2020-09-24 VITALS — BP 129/77 | HR 84 | Temp 98.0°F | Ht 64.0 in | Wt 201.0 lb

## 2020-09-24 DIAGNOSIS — E669 Obesity, unspecified: Secondary | ICD-10-CM | POA: Diagnosis not present

## 2020-09-24 DIAGNOSIS — Z6834 Body mass index (BMI) 34.0-34.9, adult: Secondary | ICD-10-CM

## 2020-09-24 DIAGNOSIS — E66811 Obesity, class 1: Secondary | ICD-10-CM

## 2020-09-24 DIAGNOSIS — E7849 Other hyperlipidemia: Secondary | ICD-10-CM | POA: Diagnosis not present

## 2020-09-24 DIAGNOSIS — E038 Other specified hypothyroidism: Secondary | ICD-10-CM

## 2020-09-24 MED FILL — SAXENDA 18 MG/3 ML PEN: 18 | 30 days supply | Qty: 15 | Fill #0

## 2020-09-24 NOTE — Progress Notes (Signed)
Chief Complaint:   OBESITY Julie Miranda is here to discuss her progress with her obesity treatment plan along with follow-up of her obesity related diagnoses. Jonnell is keeping a food journal and adhering to recommended goals of 1000-1100 calories and 75+ grams of protein and states she is following her eating plan approximately 90% of the time. Candelaria states she is walking 30 minutes 3 times per week.  Today's visit was #: 5 Starting weight: 206 lbs Starting date: 07/02/2020 Today's weight: 201 lbs Today's date: 09/24/2020 Total lbs lost to date: 5 Total lbs lost since last in-office visit: 0  Interim History: Marguarite is up 3 lbs, but doing well overall. She has been on vacation and stopped [place].  Subjective:   Other hyperlipidemia. Nusaiba is taking Lipitor.   Lab Results  Component Value Date   CHOL 216 (H) 07/02/2020   HDL 79 07/02/2020   LDLCALC 120 (H) 07/02/2020   TRIG 100 07/02/2020   CHOLHDL 3.1 04/02/2020   Lab Results  Component Value Date   ALT 11 05/30/2020   AST 16 05/30/2020   ALKPHOS 97 05/30/2020   BILITOT 0.3 05/30/2020   The 10-year ASCVD risk score Mikey Bussing DC Jr., et al., 2013) is: 5.1%   Values used to calculate the score:     Age: 59 years     Sex: Female     Is Non-Hispanic African American: Yes     Diabetic: No     Tobacco smoker: No     Systolic Blood Pressure: 485 mmHg     Is BP treated: Yes     HDL Cholesterol: 79 mg/dL     Total Cholesterol: 216 mg/dL  Other specified hypothyroidism. Maycie is taking Synthroid.   Lab Results  Component Value Date   TSH 0.049 (L) 05/30/2020   Assessment/Plan:   Other hyperlipidemia. Cardiovascular risk and specific lipid/LDL goals reviewed.  We discussed several lifestyle modifications today and Kenisha will continue to work on diet, exercise and weight loss efforts. Orders and follow up as documented in patient record. She will continue her medication as directed.   Counseling Intensive  lifestyle modifications are the first line treatment for this issue. . Dietary changes: Increase soluble fiber. Decrease simple carbohydrates. . Exercise changes: Moderate to vigorous-intensity aerobic activity 150 minutes per week if tolerated. . Lipid-lowering medications: see documented in medical record.  Other specified hypothyroidism. Patient with long-standing hypothyroidism, on levothyroxine therapy. She appears euthyroid. Orders and follow up as documented in patient record. Braelee will continue Synthroid as directed.   Counseling . Good thyroid control is important for overall health. Supratherapeutic thyroid levels are dangerous and will not improve weight loss results. . The correct way to take levothyroxine is fasting, with water, separated by at least 30 minutes from breakfast, and separated by more than 4 hours from calcium, iron, multivitamins, acid reflux medications (PPIs).   Class 1 obesity with serious comorbidity and body mass index (BMI) of 34.0 to 34.9 in adult, unspecified obesity type.  Paolina is currently in the action stage of change. As such, her goal is to continue with weight loss efforts. She has agreed to the Category 1 Plan.   She will work on meal planning, mindful eating, and continue Saxenda as directed.   Exercise goals: All adults should avoid inactivity. Some physical activity is better than none, and adults who participate in any amount of physical activity gain some health benefits.  Behavioral modification strategies: increasing lean protein intake,  decreasing simple carbohydrates, increasing vegetables, increasing water intake, decreasing eating out, no skipping meals, meal planning and cooking strategies, keeping healthy foods in the home and planning for success.  Prairie has agreed to follow-up with our clinic in 2-3 weeks. She was informed of the importance of frequent follow-up visits to maximize her success with intensive lifestyle modifications  for her multiple health conditions.   Objective:   Blood pressure 129/77, pulse 84, temperature 98 F (36.7 C), height 5\' 4"  (1.626 m), weight 201 lb (91.2 kg), SpO2 100 %. Body mass index is 34.5 kg/m.  General: Cooperative, alert, well developed, in no acute distress. HEENT: Conjunctivae and lids unremarkable. Cardiovascular: Regular rhythm.  Lungs: Normal work of breathing. Neurologic: No focal deficits.   Lab Results  Component Value Date   CREATININE 0.81 05/30/2020   BUN 8 05/30/2020   NA 142 05/30/2020   K 4.4 05/30/2020   CL 107 (H) 05/30/2020   CO2 24 05/30/2020   Lab Results  Component Value Date   ALT 11 05/30/2020   AST 16 05/30/2020   ALKPHOS 97 05/30/2020   BILITOT 0.3 05/30/2020   Lab Results  Component Value Date   HGBA1C 5.5 07/02/2020   HGBA1C 5.2 11/27/2014   Lab Results  Component Value Date   INSULIN 8.1 07/02/2020   Lab Results  Component Value Date   TSH 0.049 (L) 05/30/2020   Lab Results  Component Value Date   CHOL 216 (H) 07/02/2020   HDL 79 07/02/2020   LDLCALC 120 (H) 07/02/2020   TRIG 100 07/02/2020   CHOLHDL 3.1 04/02/2020   Lab Results  Component Value Date   WBC 2.7 (L) 04/02/2020   HGB 10.3 (L) 04/02/2020   HCT 33.3 (L) 04/02/2020   MCV 85 04/02/2020   PLT 240 04/02/2020   No results found for: IRON, TIBC, FERRITIN  Attestation Statements:   Reviewed by clinician on day of visit: allergies, medications, problem list, medical history, surgical history, family history, social history, and previous encounter notes.  Time spent on visit including pre-visit chart review and post-visit charting and care was 20 minutes.   Migdalia Dk, am acting as Location manager for [employer], DO   I have reviewed the above documentation for accuracy and completeness, and I agree with the above. Jearld Lesch, DO

## 2020-09-25 ENCOUNTER — Encounter (INDEPENDENT_AMBULATORY_CARE_PROVIDER_SITE_OTHER): Payer: Self-pay | Admitting: Bariatrics

## 2020-10-03 ENCOUNTER — Encounter: Payer: Self-pay | Admitting: Registered Nurse

## 2020-10-03 ENCOUNTER — Other Ambulatory Visit: Payer: Self-pay

## 2020-10-03 ENCOUNTER — Ambulatory Visit: Payer: 59 | Admitting: Registered Nurse

## 2020-10-03 ENCOUNTER — Other Ambulatory Visit: Payer: Self-pay | Admitting: Registered Nurse

## 2020-10-03 VITALS — BP 129/82 | HR 89 | Temp 98.0°F | Resp 18 | Ht 64.0 in | Wt 201.8 lb

## 2020-10-03 DIAGNOSIS — I10 Essential (primary) hypertension: Secondary | ICD-10-CM

## 2020-10-03 DIAGNOSIS — M79642 Pain in left hand: Secondary | ICD-10-CM | POA: Diagnosis not present

## 2020-10-03 DIAGNOSIS — G4700 Insomnia, unspecified: Secondary | ICD-10-CM

## 2020-10-03 DIAGNOSIS — K219 Gastro-esophageal reflux disease without esophagitis: Secondary | ICD-10-CM

## 2020-10-03 DIAGNOSIS — M25562 Pain in left knee: Secondary | ICD-10-CM

## 2020-10-03 DIAGNOSIS — M542 Cervicalgia: Secondary | ICD-10-CM

## 2020-10-03 DIAGNOSIS — E7800 Pure hypercholesterolemia, unspecified: Secondary | ICD-10-CM

## 2020-10-03 DIAGNOSIS — R1084 Generalized abdominal pain: Secondary | ICD-10-CM | POA: Diagnosis not present

## 2020-10-03 DIAGNOSIS — G8929 Other chronic pain: Secondary | ICD-10-CM

## 2020-10-03 DIAGNOSIS — G43709 Chronic migraine without aura, not intractable, without status migrainosus: Secondary | ICD-10-CM

## 2020-10-03 DIAGNOSIS — E559 Vitamin D deficiency, unspecified: Secondary | ICD-10-CM

## 2020-10-03 DIAGNOSIS — M25511 Pain in right shoulder: Secondary | ICD-10-CM

## 2020-10-03 DIAGNOSIS — M25512 Pain in left shoulder: Secondary | ICD-10-CM

## 2020-10-03 DIAGNOSIS — R0609 Other forms of dyspnea: Secondary | ICD-10-CM

## 2020-10-03 DIAGNOSIS — R0600 Dyspnea, unspecified: Secondary | ICD-10-CM | POA: Diagnosis not present

## 2020-10-03 DIAGNOSIS — E038 Other specified hypothyroidism: Secondary | ICD-10-CM

## 2020-10-03 DIAGNOSIS — M79641 Pain in right hand: Secondary | ICD-10-CM | POA: Diagnosis not present

## 2020-10-03 DIAGNOSIS — R079 Chest pain, unspecified: Secondary | ICD-10-CM

## 2020-10-03 DIAGNOSIS — F411 Generalized anxiety disorder: Secondary | ICD-10-CM | POA: Diagnosis not present

## 2020-10-03 MED ORDER — BUSPIRONE HCL 10 MG PO TABS: 10.0000 mg | ORAL_TABLET | Freq: Two times a day (BID) | ORAL | 0 refills | Status: DC

## 2020-10-03 MED ORDER — ALBUTEROL SULFATE HFA 108 (90 BASE) MCG/ACT IN AERS: 2.0000 | INHALATION_SPRAY | Freq: Four times a day (QID) | RESPIRATORY_TRACT | 0 refills | Status: DC | PRN

## 2020-10-03 MED ORDER — ATORVASTATIN CALCIUM 40 MG PO TABS: 40.0000 mg | ORAL_TABLET | ORAL | 1 refills | Status: DC

## 2020-10-03 MED ORDER — CITALOPRAM HYDROBROMIDE 20 MG PO TABS: 30.0000 mg | ORAL_TABLET | Freq: Every day | ORAL | 1 refills | Status: DC

## 2020-10-03 MED ORDER — PANTOPRAZOLE SODIUM 20 MG PO TBEC: DELAYED_RELEASE_TABLET | ORAL | 1 refills | Status: DC

## 2020-10-03 MED ORDER — RIZATRIPTAN BENZOATE 10 MG PO TBDP: 10.0000 mg | ORAL_TABLET | ORAL | 6 refills | Status: DC | PRN

## 2020-10-03 MED ORDER — NORTRIPTYLINE HCL 25 MG PO CAPS: ORAL_CAPSULE | ORAL | 3 refills | Status: DC

## 2020-10-03 MED ORDER — ZOLPIDEM TARTRATE 5 MG PO TABS: 5.0000 mg | ORAL_TABLET | Freq: Every evening | ORAL | 0 refills | Status: DC | PRN

## 2020-10-03 MED ORDER — VITAMIN D (ERGOCALCIFEROL) 1.25 MG (50000 UNIT) PO CAPS: 50000.0000 [IU] | ORAL_CAPSULE | ORAL | 0 refills | Status: DC

## 2020-10-03 MED ORDER — SUCRALFATE 1 GM/10ML PO SUSP: 1.0000 g | Freq: Three times a day (TID) | ORAL | 0 refills | Status: DC

## 2020-10-03 MED ORDER — GABAPENTIN 100 MG PO CAPS: 300.0000 mg | ORAL_CAPSULE | Freq: Every day | ORAL | 5 refills | Status: DC

## 2020-10-03 MED ORDER — LOSARTAN POTASSIUM 25 MG PO TABS: 25.0000 mg | ORAL_TABLET | Freq: Every day | ORAL | 1 refills | Status: DC

## 2020-10-03 MED FILL — PANTOPRAZOLE SOD DR 20 MG T: 20 | 30 days supply | Qty: 30 | Fill #0

## 2020-10-03 MED FILL — ATORVASTATIN 40 MG TABLET: 40 | 90 days supply | Qty: 45 | Fill #0

## 2020-10-03 MED FILL — busPIRone HCL 10 MG TABS: 10 | 90 days supply | Qty: 180 | Fill #0

## 2020-10-03 MED FILL — RIZATRIPTAN 10 MG ODT: 10 | 27 days supply | Qty: 15 | Fill #0

## 2020-10-03 MED FILL — SUCRALFATE 1 GM/10ML SUSP: 1 | 11 days supply | Qty: 420 | Fill #0

## 2020-10-03 MED FILL — ALBUTEROL SULFATE HFA 108 (: 108 (90 BAS | 25 days supply | Qty: 9 | Fill #0

## 2020-10-03 MED FILL — NORTRIPTYLINE HCL 25 MG CAP: 25 | 30 days supply | Qty: 60 | Fill #0

## 2020-10-03 MED FILL — ZOLPIDEM TARTRATE 5 MG TAB: 5 | 30 days supply | Qty: 30 | Fill #0

## 2020-10-03 MED FILL — CITALOPRAM HBR 20 MG TABLET: 20 | 90 days supply | Qty: 135 | Fill #0

## 2020-10-03 MED FILL — GABAPENTIN 100 MG CAPSULE: 100 | 60 days supply | Qty: 180 | Fill #0

## 2020-10-03 NOTE — Patient Instructions (Signed)
° ° ° °  If you have lab work done today you will be contacted with your lab results within the next 2 weeks.  If you have not heard from us then please contact us. The fastest way to get your results is to register for My Chart. ° ° °IF you received an x-ray today, you will receive an invoice from [facility]. Please contact [facility] at [phone] with questions or concerns regarding your invoice.  ° °IF you received labwork today, you will receive an invoice from [facility]. Please contact [facility] at [phone] with questions or concerns regarding your invoice.  ° °Our billing staff will not be able to assist you with questions regarding bills from these companies. ° °You will be contacted with the lab results as soon as they are available. The fastest way to get your results is to activate your My Chart account. Instructions are located on the last page of this paperwork. If you have not heard from us regarding the results in 2 weeks, please contact this office. °  ° ° ° °

## 2020-10-04 LAB — COMPREHENSIVE METABOLIC PANEL
ALT: 13 IU/L (ref 0–32)
AST: 21 IU/L (ref 0–40)
Albumin/Globulin Ratio: 1.4 (ref 1.2–2.2)
Albumin: 4.1 g/dL (ref 3.8–4.9)
Alkaline Phosphatase: 98 IU/L (ref 44–121)
BUN/Creatinine Ratio: 8 — ABNORMAL LOW (ref 9–23)
BUN: 7 mg/dL (ref 6–24)
Bilirubin Total: 0.4 mg/dL (ref 0.0–1.2)
CO2: 22 mmol/L (ref 20–29)
Calcium: 10.1 mg/dL (ref 8.7–10.2)
Chloride: 105 mmol/L (ref 96–106)
Creatinine, Ser: 0.88 mg/dL (ref 0.57–1.00)
GFR calc Af Amer: 84 mL/min/{1.73_m2} (ref >59)
GFR calc non Af Amer: 73 mL/min/{1.73_m2} (ref >59)
Globulin, Total: 2.9 g/dL (ref 1.5–4.5)
Glucose: 77 mg/dL (ref 65–99)
Potassium: 4.8 mmol/L (ref 3.5–5.2)
Sodium: 140 mmol/L (ref 134–144)
Total Protein: 7 g/dL (ref 6.0–8.5)

## 2020-10-04 LAB — CBC WITH DIFFERENTIAL
Basophils Absolute: 0.1 10*3/uL (ref 0.0–0.2)
Basos: 2 %
EOS (ABSOLUTE): 0.2 10*3/uL (ref 0.0–0.4)
Eos: 8 %
Hematocrit: 31.1 % — ABNORMAL LOW (ref 34.0–46.6)
Hemoglobin: 9.3 g/dL — ABNORMAL LOW (ref 11.1–15.9)
Immature Grans (Abs): 0 10*3/uL (ref 0.0–0.1)
Immature Granulocytes: 0 %
Lymphocytes Absolute: 1.1 10*3/uL (ref 0.7–3.1)
Lymphs: 37 %
MCH: 23.3 pg — ABNORMAL LOW (ref 26.6–33.0)
MCHC: 29.9 g/dL — ABNORMAL LOW (ref 31.5–35.7)
MCV: 78 fL — ABNORMAL LOW (ref 79–97)
Monocytes Absolute: 0.3 10*3/uL (ref 0.1–0.9)
Monocytes: 9 %
Neutrophils Absolute: 1.3 10*3/uL — ABNORMAL LOW (ref 1.4–7.0)
Neutrophils: 44 %
RBC: 4 x10E6/uL (ref 3.77–5.28)
RDW: 15.8 % — ABNORMAL HIGH (ref 11.7–15.4)
WBC: 3 10*3/uL — ABNORMAL LOW (ref 3.4–10.8)

## 2020-10-04 LAB — THYROID PANEL WITH TSH
Free Thyroxine Index: 2 (ref 1.2–4.9)
T3 Uptake Ratio: 28 % (ref 24–39)
T4, Total: 7.1 ug/dL (ref 4.5–12.0)
TSH: 0.232 u[IU]/mL — ABNORMAL LOW (ref 0.450–4.500)

## 2020-10-04 LAB — LIPASE: Lipase: 44 U/L (ref 14–72)

## 2020-10-04 LAB — VITAMIN D 25 HYDROXY (VIT D DEFICIENCY, FRACTURES): Vit D, 25-Hydroxy: 27.1 ng/mL — ABNORMAL LOW (ref 30.0–100.0)

## 2020-10-04 LAB — BRAIN NATRIURETIC PEPTIDE: BNP: 38.7 pg/mL (ref 0.0–100.0)

## 2020-10-04 LAB — AMYLASE: Amylase: 125 U/L — ABNORMAL HIGH (ref 31–110)

## 2020-10-04 LAB — VITAMIN B12: Vitamin B-12: 498 pg/mL (ref 232–1245)

## 2020-10-04 LAB — SEDIMENTATION RATE: Sed Rate: 17 mm/hr (ref 0–40)

## 2020-10-04 LAB — C-REACTIVE PROTEIN: CRP: <1 mg/L (ref 0–10)

## 2020-10-05 ENCOUNTER — Encounter: Payer: Self-pay | Admitting: Registered Nurse

## 2020-10-05 NOTE — Progress Notes (Signed)
Established Patient Office Visit  Subjective:  Patient ID: Julie Miranda, female    DOB: [birth year]  Age: 59 y.o. MRN: [id number]  CC:  Chief Complaint  Patient presents with   Follow-up    Patient states she is here for her 3 month follow up for thyroid. Per patient she would like to also discuss chest pain, chest burning sensation and some nausea. Medication refill.    HPI Julie Miranda presents for 3 mo follow up for thyroid.  No ongoing concerns or complaints for thyroid, feeling well, good compliance with medication.   Does note ongoing chest pain/discomfort. This has come along with some nausea. No vomiting or diarrhea. No palpitations or lightheadedness. She did address this on 09/24/20 with Dr. Owens Shark at [facility] - ekg wnl, no cause noted. Cannot identify triggers or alleviating factors. Has not tried treatment. Does note some numbness and tingling in fingers. Unsure if similar to past vit d deficiency or not.  Otherwise feeling well.   Past Medical History:  Diagnosis Date   Allergy    Anemia    Anxiety    Arthritis    Back pain    Chest pain    Colon polyps    x 3   Depression    Edema of both lower extremities    Family history of adverse reaction to anesthesia    makes mother" loopy"   Gastric ulcer    GERD (gastroesophageal reflux disease)    Graves disease    Headache    History of pancytopenia    of unknown etiology    Hyperlipidemia    Hypertension    Hypothyroidism    IBS (irritable bowel syndrome)    Joint pain    Migraine    Osteoarthritis    Thyroid disease    Vitamin D deficiency     Past Surgical History:  Procedure Laterality Date   COLONOSCOPY     GASTRIC BYPASS     JOINT REPLACEMENT N/A    [employer] 06/12/2020   POLYPECTOMY     TOTAL KNEE ARTHROPLASTY Left 07/30/2019   Procedure: LEFT TOTAL KNEE ARTHROPLASTY;  Surgeon: Leandrew Koyanagi, MD;  Location: [facility];  Service: Orthopedics;   Laterality: Left;    Family History  Problem Relation Age of Onset   Hypertension Mother    Gout Mother    Diabetes Mother    High Cholesterol Mother    Anxiety disorder Mother    Obesity Mother    Diabetes Maternal Aunt        x 2   Heart disease Sister    Pancreatic cancer Sister    Liver cancer Maternal Aunt    Stroke Father    Colon cancer Neg Hx    Kidney disease Neg Hx    Colon polyps Neg Hx    Esophageal cancer Neg Hx    Rectal cancer Neg Hx    Stomach cancer Neg Hx     Social History   Socioeconomic History   Marital status: Single    Spouse name: Not on file   Number of children: 0   Years of education: some college   Highest education level: Not on file  Occupational History   Occupation: Nurse Tech  Tobacco Use   Smoking status: Never Smoker   Smokeless tobacco: Never Used  Substance and Sexual Activity   Alcohol use: Yes    Alcohol/week: 1.0 standard drink    Types: 1 Glasses of  wine per week    Comment: occasional   Drug use: No   Sexual activity: Not Currently    Birth control/protection: Injection  Other Topics Concern   Not on file  Social History Narrative   Lives at home with her mother.   Right-handed.   One cup caffeine per day.   Social Determinants of Health   Financial Resource Strain: Not on file  Food Insecurity: Not on file  Transportation Needs: Not on file  Physical Activity: Not on file  Stress: Not on file  Social Connections: Not on file  Intimate Partner Violence: Not on file    Outpatient Medications Prior to Visit  Medication Sig Dispense Refill   Insulin Pen Needle (BD PEN NEEDLE NANO 2ND GEN) 32G X 4 MM MISC Use daily with Saxenda 100 each 0   Insulin Pen Needle (BD PEN NEEDLE NANO 2ND GEN) 32G X 4 MM MISC 1 Package by Does not apply route 2 (two) times daily. 100 each 0   levothyroxine (SYNTHROID) 137 MCG tablet Take 1 tablet (137 mcg total) by mouth daily before breakfast. 90  tablet 0   Liraglutide -Weight Management (SAXENDA) 18 MG/3ML SOPN Inject 3 mg into the skin daily. 15 mL 0   methocarbamol (ROBAXIN) 500 MG tablet Take 250 mg by mouth every 8 (eight) hours as needed for muscle spasms.     atorvastatin (LIPITOR) 40 MG tablet Take 1 tablet (40 mg total) by mouth every other day. 90 tablet 1   busPIRone (BUSPAR) 10 MG tablet Take 1 tablet (10 mg total) by mouth 2 (two) times daily. 180 tablet 0   citalopram (CELEXA) 20 MG tablet Take 1.5 tablets (30 mg total) by mouth daily. 145 tablet 1   gabapentin (NEURONTIN) 100 MG capsule Take 3 capsules (300 mg total) by mouth at bedtime. 180 capsule 5   losartan (COZAAR) 25 MG tablet Take 1 tablet (25 mg total) by mouth daily. 90 tablet 1   nortriptyline (PAMELOR) 25 MG capsule Take 1-2 at bedtime if needed to sleep. 60 capsule 3   pantoprazole (PROTONIX) 20 MG tablet Take as needed for acid reflux 1 daily 30 tablet 1   rizatriptan (MAXALT-MLT) 10 MG disintegrating tablet Take 1 tablet (10 mg total) by mouth as needed. May repeat in 2 hours if needed 15 tablet 6   Vitamin D, Ergocalciferol, (DRISDOL) 1.25 MG (50000 UNIT) CAPS capsule Take 1 capsule (50,000 Units total) by mouth every 7 (seven) days. 12 capsule 0   zolpidem (AMBIEN) 5 MG tablet Take 1-2 tablets (5-10 mg total) by mouth at bedtime as needed for sleep. 30 tablet 0   No facility-administered medications prior to visit.    Allergies  Allergen Reactions   Morphine And Related Nausea And Vomiting    ROS Review of Systems  Constitutional: Negative.   HENT: Negative.   Eyes: Negative.   Respiratory: Negative.   Cardiovascular: Positive for chest pain. Negative for palpitations and leg swelling.  Gastrointestinal: Positive for abdominal pain (upper/epigastric/blends with chest pain). Negative for abdominal distention, anal bleeding, blood in stool, constipation, diarrhea, nausea, rectal pain and vomiting.  Endocrine: Negative.   Genitourinary:  Negative.   Musculoskeletal: Negative.   Skin: Negative.   Allergic/Immunologic: Negative.   Neurological: Positive for numbness. Negative for dizziness, weakness, light-headedness and headaches.  Hematological: Negative.   Psychiatric/Behavioral: Negative.   All other systems reviewed and are negative.     Objective:    Physical Exam Vitals and nursing note reviewed.  Constitutional:      General: She is not in acute distress.    Appearance: Normal appearance. She is normal weight. She is not ill-appearing, toxic-appearing or diaphoretic.  Cardiovascular:     Rate and Rhythm: Normal rate and regular rhythm.     Heart sounds: Normal heart sounds. No murmur heard. No friction rub. No gallop.   Pulmonary:     Effort: Pulmonary effort is normal. No respiratory distress.     Breath sounds: Normal breath sounds. No stridor. No wheezing, rhonchi or rales.  Chest:     Chest wall: No tenderness.  Abdominal:     General: Abdomen is flat. Bowel sounds are normal. There is no distension.     Palpations: Abdomen is soft. There is no mass.     Tenderness: There is no abdominal tenderness. There is no right CVA tenderness or left CVA tenderness.  Skin:    General: Skin is warm and dry.  Neurological:     General: No focal deficit present.     Mental Status: She is alert and oriented to person, place, and time. Mental status is at baseline.  Psychiatric:        Mood and Affect: Mood normal.        Behavior: Behavior normal.        Thought Content: Thought content normal.        Judgment: Judgment normal.     BP 129/82    Pulse 89    Temp 98 F (36.7 C) (Temporal)    Resp 18    Ht 5\' 4"  (1.626 m)    Wt 201 lb 12.8 oz (91.5 kg)    LMP  (LMP Unknown) Comment: pt. states that it has been over 1 year   SpO2 100%    BMI 34.64 kg/m  Wt Readings from Last 3 Encounters:  10/03/20 201 lb 12.8 oz (91.5 kg)  09/24/20 201 lb (91.2 kg)  09/03/20 198 lb (89.8 kg)     There are no preventive  care reminders to display for this patient.  There are no preventive care reminders to display for this patient.  Lab Results  Component Value Date   TSH 0.232 (L) 10/03/2020   Lab Results  Component Value Date   WBC 3.0 (L) 10/03/2020   HGB 9.3 (L) 10/03/2020   HCT 31.1 (L) 10/03/2020   MCV 78 (L) 10/03/2020   PLT 240 04/02/2020   Lab Results  Component Value Date   NA 140 10/03/2020   K 4.8 10/03/2020   CO2 22 10/03/2020   GLUCOSE 77 10/03/2020   BUN 7 10/03/2020   CREATININE 0.88 10/03/2020   BILITOT 0.4 10/03/2020   ALKPHOS 98 10/03/2020   AST 21 10/03/2020   ALT 13 10/03/2020   PROT 7.0 10/03/2020   ALBUMIN 4.1 10/03/2020   CALCIUM 10.1 10/03/2020   ANIONGAP 9 07/31/2019   Lab Results  Component Value Date   CHOL 216 (H) 07/02/2020   Lab Results  Component Value Date   HDL 79 07/02/2020   Lab Results  Component Value Date   LDLCALC 120 (H) 07/02/2020   Lab Results  Component Value Date   TRIG 100 07/02/2020   Lab Results  Component Value Date   CHOLHDL 3.1 04/02/2020   Lab Results  Component Value Date   HGBA1C 5.5 07/02/2020      Assessment & Plan:   Problem List Items Addressed This Visit      Cardiovascular and Mediastinum  Essential hypertension, benign   Relevant Medications   atorvastatin (LIPITOR) 40 MG tablet   losartan (COZAAR) 25 MG tablet     Digestive   GERD (gastroesophageal reflux disease)   Relevant Medications   sucralfate (CARAFATE) 1 GM/10ML suspension   pantoprazole (PROTONIX) 20 MG tablet   Other Relevant Orders   Ambulatory referral to Gastroenterology     Endocrine   Hypothyroidism     Other   Generalized anxiety disorder   Relevant Medications   zolpidem (AMBIEN) 5 MG tablet   citalopram (CELEXA) 20 MG tablet   busPIRone (BUSPAR) 10 MG tablet   nortriptyline (PAMELOR) 25 MG capsule   Pure hypercholesterolemia   Relevant Medications   atorvastatin (LIPITOR) 40 MG tablet   losartan (COZAAR) 25 MG  tablet   Bilateral shoulder pain   Relevant Medications   gabapentin (NEURONTIN) 100 MG capsule   Cervicalgia   Relevant Orders   Ambulatory referral to Neurology   Chronic pain of left knee   Relevant Medications   gabapentin (NEURONTIN) 100 MG capsule   citalopram (CELEXA) 20 MG tablet   nortriptyline (PAMELOR) 25 MG capsule    Other Visit Diagnoses    DOE (dyspnea on exertion)    -  Primary   Relevant Medications   albuterol (VENTOLIN HFA) 108 (90 Base) MCG/ACT inhaler   Other Relevant Orders   EKG 12-Lead (Completed)   Brain natriuretic peptide (Completed)   Chest pain, unspecified type       Relevant Orders   EKG 12-Lead (Completed)   Brain natriuretic peptide (Completed)   Generalized abdominal pain       Relevant Medications   sucralfate (CARAFATE) 1 GM/10ML suspension   Other Relevant Orders   Ambulatory referral to Gastroenterology   CBC With Differential (Completed)   Comprehensive metabolic panel (Completed)   Amylase (Completed)   Lipase (Completed)   Chronic migraine without aura without status migrainosus, not intractable       Relevant Medications   atorvastatin (LIPITOR) 40 MG tablet   gabapentin (NEURONTIN) 100 MG capsule   citalopram (CELEXA) 20 MG tablet   losartan (COZAAR) 25 MG tablet   rizatriptan (MAXALT-MLT) 10 MG disintegrating tablet   nortriptyline (PAMELOR) 25 MG capsule   Other Relevant Orders   Ambulatory referral to Neurology   Vitamin B12 (Completed)   Bilateral hand pain       Relevant Orders   Thyroid Panel With TSH (Completed)   C-reactive protein (Completed)   Sedimentation Rate (Completed)   Vitamin D, 25-hydroxy (Completed)   Vitamin B12 (Completed)   Vitamin D deficiency       Relevant Medications   Vitamin D, Ergocalciferol, (DRISDOL) 1.25 MG (50000 UNIT) CAPS capsule   Other Relevant Orders   Vitamin D, 25-hydroxy (Completed)   Insomnia, unspecified type       Relevant Medications   nortriptyline (PAMELOR) 25 MG  capsule      Meds ordered this encounter  Medications   sucralfate (CARAFATE) 1 GM/10ML suspension    Sig: Take 10 mLs (1 g total) by mouth 4 (four) times daily -  with meals and at bedtime.    Dispense:  420 mL    Refill:  0    Order Specific Question:   Supervising Provider    Answer:   Carlota Raspberry, JEFFREY R [[id number]]   albuterol (VENTOLIN HFA) 108 (90 Base) MCG/ACT inhaler    Sig: Inhale 2 puffs into the lungs every 6 (six) hours as needed for wheezing or shortness  of breath.    Dispense:  8 g    Refill:  0    Order Specific Question:   Supervising Provider    Answer:   Carlota Raspberry, JEFFREY R [[id number]]   zolpidem (AMBIEN) 5 MG tablet    Sig: Take 1-2 tablets (5-10 mg total) by mouth at bedtime as needed for sleep.    Dispense:  30 tablet    Refill:  0    Order Specific Question:   Supervising Provider    Answer:   Carlota Raspberry, JEFFREY R [[id number]]   Vitamin D, Ergocalciferol, (DRISDOL) 1.25 MG (50000 UNIT) CAPS capsule    Sig: Take 1 capsule (50,000 Units total) by mouth every 7 (seven) days.    Dispense:  12 capsule    Refill:  0    Order Specific Question:   Supervising Provider    Answer:   Carlota Raspberry, JEFFREY R [[id number]]   atorvastatin (LIPITOR) 40 MG tablet    Sig: Take 1 tablet (40 mg total) by mouth every other day.    Dispense:  90 tablet    Refill:  1    Order Specific Question:   Supervising Provider    Answer:   Carlota Raspberry, JEFFREY R [[id number]]   gabapentin (NEURONTIN) 100 MG capsule    Sig: Take 3 capsules (300 mg total) by mouth at bedtime.    Dispense:  180 capsule    Refill:  5    Pt needs an appt prior to future refills.    Order Specific Question:   Supervising Provider    Answer:   Carlota Raspberry, JEFFREY R [[id number]]   citalopram (CELEXA) 20 MG tablet    Sig: Take 1.5 tablets (30 mg total) by mouth daily.    Dispense:  145 tablet    Refill:  1    Order Specific Question:   Supervising Provider    Answer:   Carlota Raspberry, JEFFREY R [[id number]]   losartan (COZAAR) 25 MG tablet    Sig: Take 1 tablet  (25 mg total) by mouth daily.    Dispense:  90 tablet    Refill:  1    Order Specific Question:   Supervising Provider    Answer:   Carlota Raspberry, JEFFREY R [[id number]]   rizatriptan (MAXALT-MLT) 10 MG disintegrating tablet    Sig: Take 1 tablet (10 mg total) by mouth as needed. May repeat in 2 hours if needed    Dispense:  15 tablet    Refill:  6    Order Specific Question:   Supervising Provider    Answer:   Carlota Raspberry, JEFFREY R [[id number]]   busPIRone (BUSPAR) 10 MG tablet    Sig: Take 1 tablet (10 mg total) by mouth 2 (two) times daily.    Dispense:  180 tablet    Refill:  0    Order Specific Question:   Supervising Provider    Answer:   Carlota Raspberry, JEFFREY R [[id number]]   nortriptyline (PAMELOR) 25 MG capsule    Sig: Take 1-2 at bedtime if needed to sleep.    Dispense:  60 capsule    Refill:  3    Order Specific Question:   Supervising Provider    Answer:   Carlota Raspberry, JEFFREY R [[id number]]   pantoprazole (PROTONIX) 20 MG tablet    Sig: Take as needed for acid reflux 1 daily    Dispense:  30 tablet    Refill:  1    Order Specific Question:   Supervising Provider    Answer:   Carlota Raspberry,  JEFFREY R [[id number]]    Follow-up: No follow-ups on file.   PLAN  ekg shows no acute concerns. Compared to ekg on 07/02/20 and 07/26/19.  Labs collected. Will follow up with the patient as warranted.  meds refilled as above  Differentials for chest pain include GERD (giving protonix and sucralfate), reactive airway (trial of albuterol), dysthyroid state, among others  Close follow up based on lab results  Patient encouraged to call clinic with any questions, comments, or concerns.  [name], NP

## 2020-10-10 ENCOUNTER — Other Ambulatory Visit: Payer: Self-pay | Admitting: Registered Nurse

## 2020-10-10 DIAGNOSIS — E038 Other specified hypothyroidism: Secondary | ICD-10-CM

## 2020-10-10 MED ORDER — LEVOTHYROXINE SODIUM 137 MCG PO TABS: 137.0000 ug | ORAL_TABLET | Freq: Every day | ORAL | 0 refills | Status: DC

## 2020-10-10 MED FILL — LEVOTHYROXINE 137 MCG TAB: 137 | 90 days supply | Qty: 90 | Fill #0

## 2020-10-10 NOTE — Telephone Encounter (Signed)
Medication Refill - Medication: levothyroxine (SYNTHROID) 137 MCG tablet  Pt was sent all her prescriptions but this one  Please advise   Has the patient contacted their pharmacy? No. (Agent: If no, request that the patient contact the pharmacy for the refill.) (Agent: If yes, when and what did the pharmacy advise?)  Preferred Pharmacy (with phone number or street name):  [place], [place] - [address] [facility]. Phone:  [phone]  Fax:  [phone]       Agent: Please be advised that RX refills may take up to 3 business days. We ask that you follow-up with your pharmacy.

## 2020-10-13 ENCOUNTER — Ambulatory Visit (INDEPENDENT_AMBULATORY_CARE_PROVIDER_SITE_OTHER): Payer: 59 | Admitting: Bariatrics

## 2020-10-14 ENCOUNTER — Encounter: Payer: Self-pay | Admitting: Registered Nurse

## 2020-11-20 ENCOUNTER — Telehealth (INDEPENDENT_AMBULATORY_CARE_PROVIDER_SITE_OTHER): Payer: Self-pay | Admitting: Bariatrics

## 2020-11-20 ENCOUNTER — Ambulatory Visit (INDEPENDENT_AMBULATORY_CARE_PROVIDER_SITE_OTHER): Payer: 59 | Admitting: Bariatrics

## 2020-11-20 ENCOUNTER — Other Ambulatory Visit (INDEPENDENT_AMBULATORY_CARE_PROVIDER_SITE_OTHER): Payer: Self-pay

## 2020-11-20 DIAGNOSIS — E038 Other specified hypothyroidism: Secondary | ICD-10-CM

## 2020-11-20 DIAGNOSIS — D509 Iron deficiency anemia, unspecified: Secondary | ICD-10-CM

## 2020-11-20 DIAGNOSIS — D649 Anemia, unspecified: Secondary | ICD-10-CM

## 2020-11-20 MED ORDER — LEVOTHYROXINE SODIUM 125 MCG PO TABS: 125.0000 ug | ORAL_TABLET | Freq: Every day | ORAL | 0 refills | Status: DC

## 2020-11-20 MED ORDER — LEVOTHYROXINE SODIUM 137 MCG PO TABS: 125.0000 ug | ORAL_TABLET | Freq: Every day | ORAL | 0 refills | Status: DC

## 2020-11-20 MED FILL — LEVOTHYROXINE SODIUM 125 MC: 125 | 30 days supply | Qty: 30 | Fill #0

## 2020-11-20 NOTE — Telephone Encounter (Signed)
Pt had an appointment today. I had planned to review her labs, but she did not show up for her appointment. Her hemoglobin and hematocrit is low. Will try to reach or send a note to her PCP.

## 2020-11-20 NOTE — Telephone Encounter (Signed)
Phone call to patient. Her hemoglobin and hematocrit are lower than normal ( history of anemia, long standing). She has been fatigued. She is not taking iron. She denies having an iron infusion in the past. Will refer to hematology for an evaluation and treatment.  Also her TSH is low.  Her synthroid dose was lowered from 137 to 125 mg. Prescription sent to her pharmacy and patient aware.

## 2020-11-21 ENCOUNTER — Telehealth: Payer: Self-pay | Admitting: Physician Assistant

## 2020-11-21 ENCOUNTER — Encounter: Payer: Self-pay | Admitting: Nurse Practitioner

## 2020-11-21 NOTE — Telephone Encounter (Signed)
Received a new hem referral from Dr. Jearld Lesch for [place]. Ms. Sheaffer has been cld and scheduled to see Cassie on 2/7 at 1:30pm w/labs at 1pm. Pt aware to arrive 30 minutes early.

## 2020-11-27 ENCOUNTER — Encounter (INDEPENDENT_AMBULATORY_CARE_PROVIDER_SITE_OTHER): Payer: Self-pay | Admitting: Bariatrics

## 2020-11-27 ENCOUNTER — Other Ambulatory Visit: Payer: Self-pay

## 2020-11-27 ENCOUNTER — Other Ambulatory Visit (INDEPENDENT_AMBULATORY_CARE_PROVIDER_SITE_OTHER): Payer: Self-pay | Admitting: Bariatrics

## 2020-11-27 ENCOUNTER — Ambulatory Visit (INDEPENDENT_AMBULATORY_CARE_PROVIDER_SITE_OTHER): Payer: 59 | Admitting: Bariatrics

## 2020-11-27 VITALS — BP 111/73 | HR 74 | Temp 98.2°F | Ht 64.0 in | Wt 201.0 lb

## 2020-11-27 DIAGNOSIS — Z6834 Body mass index (BMI) 34.0-34.9, adult: Secondary | ICD-10-CM | POA: Diagnosis not present

## 2020-11-27 DIAGNOSIS — E669 Obesity, unspecified: Secondary | ICD-10-CM

## 2020-11-27 DIAGNOSIS — Z9189 Other specified personal risk factors, not elsewhere classified: Secondary | ICD-10-CM | POA: Diagnosis not present

## 2020-11-27 DIAGNOSIS — E559 Vitamin D deficiency, unspecified: Secondary | ICD-10-CM

## 2020-11-27 DIAGNOSIS — D509 Iron deficiency anemia, unspecified: Secondary | ICD-10-CM

## 2020-11-27 DIAGNOSIS — E039 Hypothyroidism, unspecified: Secondary | ICD-10-CM

## 2020-11-27 MED ORDER — VITAMIN D (ERGOCALCIFEROL) 1.25 MG (50000 UNIT) PO CAPS: 50000.0000 [IU] | ORAL_CAPSULE | ORAL | 0 refills | Status: DC

## 2020-11-27 MED ORDER — BD PEN NEEDLE NANO 2ND GEN 32G X 4 MM MISC: 0 refills | Status: DC

## 2020-11-27 MED ORDER — SAXENDA 18 MG/3ML ~~LOC~~ SOPN: 3.0000 mg | PEN_INJECTOR | Freq: Every day | SUBCUTANEOUS | 0 refills | Status: DC

## 2020-11-27 MED FILL — VIT D2 1.25 MG (50,000 UNIT: 1.25 MG | 84 days supply | Qty: 12 | Fill #0

## 2020-11-28 ENCOUNTER — Other Ambulatory Visit: Payer: Self-pay | Admitting: Physician Assistant

## 2020-11-28 DIAGNOSIS — D649 Anemia, unspecified: Secondary | ICD-10-CM

## 2020-11-28 NOTE — Progress Notes (Signed)
[place] [phone]   [phone]  CONSULT NOTE  REFERRING PHYSICIAN: Jearld Lesch DO  REASON FOR CONSULTATION:  Anemia  HPI Julie Miranda is a 60 y.o. female with past medical history significant for her bariatric surgery with Roux-en-Y in 2004, hypertension, migraines, GERD, osteoarthritis, neuropathy, IBS-constipation subtype, depression, hypothyroidism, carpal tunnel, hyperlipidemia, and arthritis is referred to the clinic for evaluation of anemia.  Of note, the patient was actually seen here at [facility] in 2014 for the same concern as well as leukocytopenia.  At that time, the patient had several lab studies performed to evaluate her condition including ANA testing, HIV, hepatitis panel testing, and a bone marrow biopsy and aspirate which all were negative.  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 Although, the patient states her anemia pre-dates her bypass surgery. She states she has been anemic since she was a teenager.    Recently, the patient was seen [facility] December 2021.  The patient had routine lab studies performed which showed persistent anemia with a hemoglobin low at 9.3, elevated RDW at 15.8, slightly low MCV at 78.  The patient's white blood cell count was slightly low at 3.0.  Her platelet count was within normal limits.  She also had vitamin B12 testing performed that day which was within normal limits.  The patient was subsequently referred to the clinic today for further evaluation regarding her condition.   Her last colonoscopy and upper endoscopy was performed on 07/06/2017 under the care of Dr. Hilarie Fredrickson which was unremarkable except for some internal hemorrhoids and there were small 5 mm polyup. Her most recent endoscopy was in 2015 which was unremarkable.  The patient does have an appointment with GI next week on 12/08/20 for consideration of repeat upper endoscopy per patient report. The patient states she has been having some left upper quadrant abdominal pain. She states her stools are brown. She denies bright red blood in the stool or black tarry stool.   To the patient's knowledge, she has been anemic since she was a teenager; however, it has never been significant enough to warrant a blood transfusion or iron infusion. She has been feeling poorly over the last year or so with shortness of breath with exertion, dizziness, palpitations, fatigue, and weakness. She states sometimes she has chest achiness. She had an EKG performed by her PCP for this which was unremarkable. She takes a multivitamin daily. She also reportedly takes a prescription iron supplement that is supposed to be taken once a week. I do not see this on her medication list. Unclear how much iron this supplement contains. She states she has been taking this for a few months. It does exacerbate her IBS-constipation subtype. She is post-menopausal. Denies vaginal bleeding. Denies epistaxis, gingival bleeding, hematemesis, hemoptysis, or hematuria. Denies extremity bruising.  The patient denies any blood thinner use. The patient denies any particular dietary habits such as being a vegan or vegetarian except she does not eat a lot of red meat due to it being hard for her to digest and causing abdominal discomfort. Denies any fever, night sweats, or unexplained weight loss. Denies lymphadenopathy. Denies history of HIV, hepatitis, and EBV. Denies other history of nutrient deficiency except she reportedly takes a B12 supplement when she remembers to do so. She recently  had ESR/CRP testing which was normal. She denies herbal supplement use. Denies frequent infections.   The patient denies any family history of thalassemia or sickle cell anemia except for a nephew with sickle cell trait.  The patients  mother has neuropathy, hypertension, and osteoarthritis.  The patient's father passed away secondary to a stroke.  The patient sister passed away secondary to pancreatic cancer and stroke.   Patient works as a [employer] certified accountant.  The patient is single does not have any children.  She denies any drug or tobacco use.  He drinks approximately 1 alcoholic beverage per week.    HPI  Past Medical History:  Diagnosis Date  . Allergy   . Anemia   . Anxiety   . Arthritis   . Back pain   . Chest pain   . Colon polyps    x 3  . Depression   . Edema of both lower extremities   . Family history of adverse reaction to anesthesia    makes mother" loopy"  . Gastric ulcer   . GERD (gastroesophageal reflux disease)   . Graves disease   . Headache   . History of pancytopenia    of unknown etiology   . Hyperlipidemia   . Hypertension   . Hypothyroidism   . IBS (irritable bowel syndrome)   . Joint pain   . Migraine   . Osteoarthritis   . Thyroid disease   . Vitamin D deficiency     Past Surgical History:  Procedure Laterality Date  . COLONOSCOPY    . GASTRIC BYPASS    . JOINT REPLACEMENT N/A    Phreesia 06/12/2020  . POLYPECTOMY    . TOTAL KNEE ARTHROPLASTY Left 07/30/2019   Procedure: LEFT TOTAL KNEE ARTHROPLASTY;  Surgeon: Leandrew Koyanagi, MD;  Location: [facility];  Service: Orthopedics;  Laterality: Left;    Family History  Problem Relation Age of Onset  . Hypertension Mother   . Gout Mother   . Diabetes Mother   . High Cholesterol Mother   . Anxiety disorder Mother   . Obesity Mother   . Diabetes Maternal Aunt        x 2  . Heart disease Sister   . Pancreatic cancer Sister   . Liver cancer Maternal Aunt   . Stroke Father   . Colon cancer Neg Hx   . Kidney disease Neg Hx   . Colon polyps Neg Hx   . Esophageal cancer Neg Hx   . Rectal cancer Neg Hx   . Stomach cancer Neg Hx     Social History Social History   Tobacco Use  . Smoking status: Never Smoker  . Smokeless  tobacco: Never Used  Substance Use Topics  . Alcohol use: Yes    Alcohol/week: 1.0 standard drink    Types: 1 Glasses of wine per week    Comment: occasional  . Drug use: No    Allergies  Allergen Reactions  . Morphine And Related Nausea And Vomiting    Current Outpatient Medications  Medication Sig Dispense Refill  . albuterol (VENTOLIN HFA) 108 (90 Base) MCG/ACT inhaler Inhale 2 puffs into the lungs every 6 (six) hours as needed for wheezing or shortness of breath. 8 g 0  . atorvastatin (LIPITOR) 40 MG tablet Take 1 tablet (40 mg total) by mouth every other day. 90 tablet 1  . busPIRone (BUSPAR) 10 MG tablet Take 1 tablet (10 mg total) by mouth 2 (two) times daily. 180 tablet  0  . citalopram (CELEXA) 20 MG tablet Take 1.5 tablets (30 mg total) by mouth daily. 145 tablet 1  . gabapentin (NEURONTIN) 100 MG capsule Take 3 capsules (300 mg total) by mouth at bedtime. 180 capsule 5  . Insulin Pen Needle (BD PEN NEEDLE NANO 2ND GEN) 32G X 4 MM MISC Use daily with Saxenda 100 each 0  . levothyroxine (SYNTHROID) 125 MCG tablet Take 1 tablet (125 mcg total) by mouth daily. 30 tablet 0  . Liraglutide -Weight Management (SAXENDA) 18 MG/3ML SOPN Inject 3 mg into the skin daily. 15 mL 0  . losartan (COZAAR) 25 MG tablet Take 1 tablet (25 mg total) by mouth daily. 90 tablet 1  . methocarbamol (ROBAXIN) 500 MG tablet Take 250 mg by mouth every 8 (eight) hours as needed for muscle spasms.    . nortriptyline (PAMELOR) 25 MG capsule Take 1-2 at bedtime if needed to sleep. 60 capsule 3  . pantoprazole (PROTONIX) 20 MG tablet Take as needed for acid reflux 1 daily 30 tablet 1  . rizatriptan (MAXALT-MLT) 10 MG disintegrating tablet Take 1 tablet (10 mg total) by mouth as needed. May repeat in 2 hours if needed 15 tablet 6  . sucralfate (CARAFATE) 1 GM/10ML suspension Take 10 mLs (1 g total) by mouth 4 (four) times daily -  with meals and at bedtime. 420 mL 0  . Vitamin D, Ergocalciferol, (DRISDOL) 1.25 MG  (50000 UNIT) CAPS capsule Take 1 capsule (50,000 Units total) by mouth every 7 (seven) days. 12 capsule 0  . zolpidem (AMBIEN) 5 MG tablet Take 1-2 tablets (5-10 mg total) by mouth at bedtime as needed for sleep. 30 tablet 0   No current facility-administered medications for this visit.    REVIEW OF SYSTEMS:   Review of Systems  Constitutional: Positive for fatigue, generalized weakness, and appetite change.  Negative for chills, fever and unexpected weight change.  HENT: Negative for mouth sores, nosebleeds, sore throat and trouble swallowing.   Eyes: Positive for "black spots in vision" RT eye. Negative for icterus.  Respiratory: Positive for shortness of breath with exertion. Negative for cough, hemoptysis and wheezing.   Cardiovascular: Negative for chest pain and leg swelling.  Gastrointestinal: Positive for abdominal discomfort with eating, constipation, and nausea. Negative for diarrhea and vomiting.  Genitourinary: Negative for bladder incontinence, difficulty urinating, dysuria, frequency and hematuria.   Musculoskeletal: Positive for knee pain (bilateral) Negative for back pain, gait problem, neck pain and neck stiffness.  Skin: Negative for itching and rash.  Neurological: Positive for dizziness and headache. Negative for extremity weakness, gait problem, light-headedness and seizures.  Hematological: Negative for adenopathy. Does not bruise/bleed easily.  Psychiatric/Behavioral: Negative for confusion, depression and sleep disturbance. The patient is not nervous/anxious.     PHYSICAL EXAMINATION:  Blood pressure 117/78, pulse 87, temperature 98.3 F (36.8 C), temperature source Tympanic, resp. rate 18, height '5\' 4"'  (1.626 m), weight 209 lb 4.8 oz (94.9 kg), SpO2 100 %.  ECOG PERFORMANCE STATUS: 1 - Symptomatic but completely ambulatory  Physical Exam  Constitutional: Oriented to person, place, and time and well-developed, well-nourished, and in no distress.  HENT:  Head:  Normocephalic and atraumatic.  Mouth/Throat: Oropharynx is clear and moist. No oropharyngeal exudate.  Eyes: Conjunctivae are normal. Right eye exhibits no discharge. Left eye exhibits no discharge. No scleral icterus.  Neck: Normal range of motion. Neck supple.  Cardiovascular: Normal rate, regular rhythm, normal heart sounds and intact distal pulses.   Pulmonary/Chest: Effort normal and  breath sounds normal. No respiratory distress. No wheezes. No rales.  Abdominal: Soft. Bowel sounds are normal. Exhibits no distension and no mass. Tenderness in LUQ with light palpation.  Musculoskeletal: Normal range of motion. Exhibits no edema.  Lymphadenopathy:    No cervical adenopathy.  Neurological: Alert and oriented to person, place, and time. Exhibits normal muscle tone. Gait normal. Coordination normal.  Skin: Skin is warm and dry. No rash noted. Not diaphoretic. No erythema. No pallor.  Psychiatric: Mood, memory and judgment normal.  Vitals reviewed.  LABORATORY DATA: Lab Results  Component Value Date   WBC 3.5 (L) 12/01/2020   HGB 8.1 (L) 12/01/2020   HCT 28.3 (L) 12/01/2020   MCV 79.3 (L) 12/01/2020   PLT 498 (H) 12/01/2020      Chemistry      Component Value Date/Time   NA 142 12/01/2020 1302   NA 140 10/03/2020 1030   K 4.1 12/01/2020 1302   CL 109 12/01/2020 1302   CO2 27 12/01/2020 1302   BUN 9 12/01/2020 1302   BUN 7 10/03/2020 1030   CREATININE 0.80 12/01/2020 1302   CREATININE 1.00 03/01/2016 1053      Component Value Date/Time   CALCIUM 9.5 12/01/2020 1302   ALKPHOS 95 12/01/2020 1302   AST 21 12/01/2020 1302   ALT 15 12/01/2020 1302   BILITOT 0.3 12/01/2020 1302       RADIOGRAPHIC STUDIES: No results found.  ASSESSMENT: This is a very pleasant 60 year old African American female with iron deficiency anemia.   PLAN: The patient was seen with Dr. Julien Nordmann today. The patient had several lab studies performed including a CBC, CMP, iron studies, ferritin, B12,  folate, SPEP with IFE, hemoglobin electrophoresis, and stool cards performed.  Her hemoglobin continues to trend downward.  Her hemoglobin today is low at 8.1 and microcytic.  Her WBC count continues to be low.  The patient previously had a bone marrow biopsy and aspirate performed several years ago which was unremarkable.  Dr. Earlie Server believes that her leukopenia may be an ethnic variant.  The patient's iron studies show significant iron deficiency with a ferritin low at 4, a low total iron at 22 a low saturation at 4%.  Her other lab studies are pending at this time.  Dr. Julien Nordmann recommend that we arrange for the patient to receive IV iron weekly x2 with Feraheme or Venofer weekly x4 depending on insurance preference.  We will arrange for this to begin next week.  In the meantime, the patient was given stool cards to assess for GI blood loss.  She was strongly encouraged to keep her appointment next week with gastroenterology as scheduled.  The patient has been endorsing abdominal discomfort with eating.   We will see the patient back for follow-up visit in 6 weeks for evaluation and repeat blood work.  The patient voices understanding of current disease status and treatment options and is in agreement with the current care plan.  All questions were answered. The patient knows to call the clinic with any problems, questions or concerns. We can certainly see the patient much sooner if necessary.  Thank you so much for allowing me to participate in the care of Julie Miranda. I will continue to follow up the patient with you and assist in her care.  I spent 40-54 minutes in this encounter  Disclaimer: This note was dictated with voice recognition software. Similar sounding words can inadvertently be transcribed and may not be corrected upon  review.   Cyd Hostler L Arah Aro December 01, 2020, 2:31 PM  ADDENDUM: Hematology/Oncology Attending: I had a face-to-face encounter with the patient  today.  I reviewed her records, labs and recommended her care plan.  This is a very pleasant 60 years old African-American female with history of bariatric surgery with Roux-en-Y in 2004 in addition to history of GERD, irritable bowel syndrome, hypothyroidism, dyslipidemia and as well as arthritis.  I have seen the patient in 2014 for evaluation of anemia and leukocytopenia at that time.  She had extensive work-up that was unremarkable.  She also had bone marrow biopsy and aspirate that was normal.  She was followed by observation by her primary care physician.  Recently she had blood work that showed significant decrease in her hemoglobin and hematocrit.  She was referred back to [place] today for evaluation and recommendation regarding her condition.  Her last colonoscopy was in 2018 and that was unremarkable except for internal hemorrhoids and small polyp.  She also had upper endoscopy in 2015 that was unremarkable. I had a lengthy discussion with the patient today about her current condition and investigation to confirm her diagnosis.  We order several studies today including repeat CBC hemoglobin down to 8.1 with hematocrit of 28.3% and MCV of 79.3.  She continues to have leukocytopenia with total white blood count of 3.5 and platelets count 498,000.  The iron study performed today showed low ferritin of 4, serum iron 22 and iron binding capacity of 523 with iron saturation of 4%. We order other studies to rule out any other abnormality including SPEP with immunofixation as well as hemoglobin electrophoresis. I recommended for the patient to proceed with iron infusion with Feraheme 510 mg IV weekly for 2 weeks. We will also check her stool for Hemoccult and referred the patient to gastroenterology for consideration of repeat upper endoscopy. The patient will come back for follow-up visit in around 6 weeks. She was advised to call immediately if she has any other concerning symptoms in the  interval.  Disclaimer: This note was dictated with voice recognition software. Similar sounding words can inadvertently be transcribed and may be missed upon review. Eilleen Kempf, MD 12/01/20

## 2020-12-01 ENCOUNTER — Other Ambulatory Visit: Payer: Self-pay

## 2020-12-01 ENCOUNTER — Inpatient Hospital Stay: Payer: 59 | Attending: Physician Assistant | Admitting: Physician Assistant

## 2020-12-01 ENCOUNTER — Inpatient Hospital Stay: Payer: 59

## 2020-12-01 VITALS — BP 117/78 | HR 87 | Temp 98.3°F | Resp 18 | Ht 64.0 in | Wt 209.3 lb

## 2020-12-01 DIAGNOSIS — Z800 Family history of malignant neoplasm of digestive organs: Secondary | ICD-10-CM | POA: Insufficient documentation

## 2020-12-01 DIAGNOSIS — D649 Anemia, unspecified: Secondary | ICD-10-CM

## 2020-12-01 DIAGNOSIS — D509 Iron deficiency anemia, unspecified: Secondary | ICD-10-CM

## 2020-12-01 LAB — IRON AND TIBC
Iron: 22 ug/dL — ABNORMAL LOW (ref 41–142)
Saturation Ratios: 4 % — ABNORMAL LOW (ref 21–57)
TIBC: 523 ug/dL — ABNORMAL HIGH (ref 236–444)
UIBC: 500 ug/dL — ABNORMAL HIGH (ref 120–384)

## 2020-12-01 LAB — CBC WITH DIFFERENTIAL (CANCER CENTER ONLY)
Abs Immature Granulocytes: 0.01 10*3/uL (ref 0.00–0.07)
Basophils Absolute: 0.1 10*3/uL (ref 0.0–0.1)
Basophils Relative: 1 %
Eosinophils Absolute: 0.2 10*3/uL (ref 0.0–0.5)
Eosinophils Relative: 5 %
HCT: 28.3 % — ABNORMAL LOW (ref 36.0–46.0)
Hemoglobin: 8.1 g/dL — ABNORMAL LOW (ref 12.0–15.0)
Immature Granulocytes: 0 %
Lymphocytes Relative: 35 %
Lymphs Abs: 1.2 10*3/uL (ref 0.7–4.0)
MCH: 22.7 pg — ABNORMAL LOW (ref 26.0–34.0)
MCHC: 28.6 g/dL — ABNORMAL LOW (ref 30.0–36.0)
MCV: 79.3 fL — ABNORMAL LOW (ref 80.0–100.0)
Monocytes Absolute: 0.3 10*3/uL (ref 0.1–1.0)
Monocytes Relative: 10 %
Neutro Abs: 1.7 10*3/uL (ref 1.7–7.7)
Neutrophils Relative %: 49 %
Platelet Count: 498 10*3/uL — ABNORMAL HIGH (ref 150–400)
RBC: 3.57 MIL/uL — ABNORMAL LOW (ref 3.87–5.11)
RDW: 19.8 % — ABNORMAL HIGH (ref 11.5–15.5)
WBC Count: 3.5 10*3/uL — ABNORMAL LOW (ref 4.0–10.5)
nRBC: 0 % (ref 0.0–0.2)

## 2020-12-01 LAB — CMP (CANCER CENTER ONLY)
ALT: 15 U/L (ref 0–44)
AST: 21 U/L (ref 15–41)
Albumin: 3.9 g/dL (ref 3.5–5.0)
Alkaline Phosphatase: 95 U/L (ref 38–126)
Anion gap: 6 (ref 5–15)
BUN: 9 mg/dL (ref 6–20)
CO2: 27 mmol/L (ref 22–32)
Calcium: 9.5 mg/dL (ref 8.9–10.3)
Chloride: 109 mmol/L (ref 98–111)
Creatinine: 0.8 mg/dL (ref 0.44–1.00)
GFR, Estimated: >60 mL/min (ref >60)
Glucose, Bld: 93 mg/dL (ref 70–99)
Potassium: 4.1 mmol/L (ref 3.5–5.1)
Sodium: 142 mmol/L (ref 135–145)
Total Bilirubin: 0.3 mg/dL (ref 0.3–1.2)
Total Protein: 7.2 g/dL (ref 6.5–8.1)

## 2020-12-01 LAB — FERRITIN: Ferritin: 4 ng/mL — ABNORMAL LOW (ref 11–307)

## 2020-12-01 LAB — FOLATE: Folate: 13.5 ng/mL (ref >5.9)

## 2020-12-01 LAB — VITAMIN B12: Vitamin B-12: 316 pg/mL (ref 180–914)

## 2020-12-01 NOTE — Progress Notes (Signed)
Chief Complaint:   OBESITY Julie Miranda is here to discuss her progress with her obesity treatment plan along with follow-up of her obesity related diagnoses. Julie Miranda is on the Category 1 Plan and states she is following her eating plan approximately 50% of the time. Julie Miranda states she is walking at work.  Today's visit was #: 6 Starting weight: 206 lbs Starting date: 07/02/2020 Today's weight: 201 lbs Today's date: 11/27/2020 Total lbs lost to date: 5 lbs Total lbs lost since last in-office visit: 0  Interim History: Julie Miranda's weight remains the same. She is taking [place] and doing well. She is drinking more water.  Subjective:   1. Vitamin D deficiency Julie Miranda's Vitamin D level was 27.1 on 10/03/2020. She is currently taking prescription vitamin D 50,000 IU each week. She denies nausea, vomiting or muscle weakness.   Ref. Range 10/03/2020 10:31  Vitamin D, 25-Hydroxy Latest Ref Range: 30.0 - 100.0 ng/mL 27.1 (L)   2. Hypothyroidism, unspecified type Julie Miranda had a change in medication. Her last TSH level was low at 0.232.   Lab Results  Component Value Date   TSH 0.232 (L) 10/03/2020    3. Iron deficiency anemia, unspecified iron deficiency anemia type Julie Miranda reports increased fatigue.   CBC Latest Ref Rng & Units 10/03/2020 04/02/2020 07/31/2019  WBC 3.4 - 10.8 x10E3/uL 3.0(L) 2.7(L) 6.2  Hemoglobin 11.1 - 15.9 g/dL 9.3(L) 10.3(L) 10.3(L)  Hematocrit 34.0 - 46.6 % 31.1(L) 33.3(L) 33.9(L)  Platelets 150 - 450 x10E3/uL - 240 237   No results found for: IRON, TIBC, FERRITIN Lab Results  Component Value Date   VITAMINB12 498 10/03/2020    4. At risk for osteoporosis Julie Miranda is at higher risk of osteopenia and osteoporosis due to Vitamin D deficiency.   Assessment/Plan:   1. Vitamin D deficiency Low Vitamin D level contributes to fatigue and are associated with obesity, breast, and colon cancer. She agrees to continue to take prescription Vitamin D @50 ,000 IU every week and  will follow-up for routine testing of Vitamin D, at least 2-3 times per year to avoid over-replacement.  - Vitamin D, Ergocalciferol, (DRISDOL) 1.25 MG (50000 UNIT) CAPS capsule; Take 1 capsule (50,000 Units total) by mouth every 7 (seven) days.  Dispense: 12 capsule; Refill: 0  2. Hypothyroidism, unspecified type Recheck in 1 month.  Counseling . Good thyroid control is important for overall health. Supratherapeutic thyroid levels are dangerous and will not improve weight loss results. . Counseling: The correct way to take levothyroxine is fasting, with water, separated by at least 30 minutes from breakfast, and separated by more than 4 hours from calcium, iron, multivitamins, acid reflux medications (PPIs).    3. Iron deficiency anemia, unspecified iron deficiency anemia type Referral to hematology. Appt is 12/01/2020.  4. At risk for osteoporosis Julie Miranda was given approximately 15 minutes of osteoporosis prevention counseling today. Julie Miranda is at risk for osteopenia and osteoporosis due to her Vitamin D deficiency. She was encouraged to take her Vitamin D and follow her higher calcium diet and increase strengthening exercise to help strengthen her bones and decrease her risk of osteopenia and osteoporosis.  Repetitive spaced learning was employed today to elicit superior memory formation and behavioral change.  5. Class 1 obesity with serious comorbidity and body mass index (BMI) of 34.0 to 34.9 in adult, unspecified obesity type Julie Miranda is currently in the action stage of change. As such, her goal is to continue with weight loss efforts. She has agreed to the Category 1  Plan.   We discussed various medication options to help Julie Miranda with her weight loss efforts and we both agreed to continue Saxenda 3 mg. She will also focus on meal plan and increase protein.  - Liraglutide -Weight Management (SAXENDA) 18 MG/3ML SOPN; Inject 3 mg into the skin daily.  Dispense: 15 mL; Refill: 0 - Insulin Pen  Needle (BD PEN NEEDLE NANO 2ND GEN) 32G X 4 MM MISC; Use daily with Saxenda  Dispense: 100 each; Refill: 0  Exercise goals: As is  Behavioral modification strategies: increasing lean protein intake, decreasing simple carbohydrates, increasing vegetables, increasing water intake, decreasing eating out, no skipping meals, meal planning and cooking strategies, keeping healthy foods in the home and planning for success.  Julie Miranda has agreed to follow-up with our clinic in 2 weeks. She was informed of the importance of frequent follow-up visits to maximize her success with intensive lifestyle modifications for her multiple health conditions.   Objective:   Blood pressure 111/73, pulse 74, temperature 98.2 F (36.8 C), temperature source Oral, height 5\' 4"  (1.626 m), weight 201 lb (91.2 kg), SpO2 95 %. Body mass index is 34.5 kg/m.  General: Cooperative, alert, well developed, in no acute distress. HEENT: Conjunctivae and lids unremarkable. Cardiovascular: Regular rhythm.  Lungs: Normal work of breathing. Neurologic: No focal deficits.   Lab Results  Component Value Date   CREATININE 0.88 10/03/2020   BUN 7 10/03/2020   NA 140 10/03/2020   K 4.8 10/03/2020   CL 105 10/03/2020   CO2 22 10/03/2020   Lab Results  Component Value Date   ALT 13 10/03/2020   AST 21 10/03/2020   ALKPHOS 98 10/03/2020   BILITOT 0.4 10/03/2020   Lab Results  Component Value Date   HGBA1C 5.5 07/02/2020   HGBA1C 5.2 11/27/2014   Lab Results  Component Value Date   INSULIN 8.1 07/02/2020   Lab Results  Component Value Date   TSH 0.232 (L) 10/03/2020   Lab Results  Component Value Date   CHOL 216 (H) 07/02/2020   HDL 79 07/02/2020   LDLCALC 120 (H) 07/02/2020   TRIG 100 07/02/2020   CHOLHDL 3.1 04/02/2020   Lab Results  Component Value Date   WBC 3.0 (L) 10/03/2020   HGB 9.3 (L) 10/03/2020   HCT 31.1 (L) 10/03/2020   MCV 78 (L) 10/03/2020   PLT 240 04/02/2020   No results found for:  IRON, TIBC, FERRITIN  Attestation Statements:   Reviewed by clinician on day of visit: allergies, medications, problem list, medical history, surgical history, family history, social history, and previous encounter notes.  Coral Ceo, am acting as Location manager for [employer], DO.  I have reviewed the above documentation for accuracy and completeness, and I agree with the above. Jearld Lesch, DO

## 2020-12-02 ENCOUNTER — Telehealth: Payer: Self-pay | Admitting: Physician Assistant

## 2020-12-02 LAB — PROTEIN ELECTROPHORESIS, SERUM, WITH REFLEX
A/G Ratio: 1.1 (ref 0.7–1.7)
Albumin ELP: 3.5 g/dL (ref 2.9–4.4)
Alpha-1-Globulin: 0.2 g/dL (ref 0.0–0.4)
Alpha-2-Globulin: 0.7 g/dL (ref 0.4–1.0)
Beta Globulin: 1.3 g/dL (ref 0.7–1.3)
Gamma Globulin: 1 g/dL (ref 0.4–1.8)
Globulin, Total: 3.2 g/dL (ref 2.2–3.9)
Total Protein ELP: 6.7 g/dL (ref 6.0–8.5)

## 2020-12-02 NOTE — Telephone Encounter (Signed)
Scheduled appointments per 2/7 los. Spoke to patient who is aware of appointments dates and times.

## 2020-12-03 ENCOUNTER — Other Ambulatory Visit: Payer: Self-pay | Admitting: Physician Assistant

## 2020-12-03 ENCOUNTER — Telehealth: Payer: Self-pay | Admitting: Physician Assistant

## 2020-12-03 LAB — HGB FRACTIONATION CASCADE
Hgb A2: 2 % (ref 1.8–3.2)
Hgb A: 98 % (ref 96.4–98.8)
Hgb F: 0 % (ref 0.0–2.0)
Hgb S: 0 %

## 2020-12-03 NOTE — Telephone Encounter (Signed)
I called the patient to let her know her insurance prefers venofer, therefore, I have sent a scheduling message to add on an additional week of iron. I just wanted to alert her as to why she was being scheduled for an additional infusion appointment.

## 2020-12-08 ENCOUNTER — Ambulatory Visit: Payer: 59 | Admitting: Nurse Practitioner

## 2020-12-10 DIAGNOSIS — D509 Iron deficiency anemia, unspecified: Secondary | ICD-10-CM | POA: Diagnosis not present

## 2020-12-10 DIAGNOSIS — Z800 Family history of malignant neoplasm of digestive organs: Secondary | ICD-10-CM | POA: Diagnosis not present

## 2020-12-11 ENCOUNTER — Other Ambulatory Visit: Payer: Self-pay

## 2020-12-11 ENCOUNTER — Inpatient Hospital Stay: Payer: 59

## 2020-12-11 VITALS — BP 132/73 | HR 80 | Temp 98.0°F | Resp 18

## 2020-12-11 DIAGNOSIS — D509 Iron deficiency anemia, unspecified: Secondary | ICD-10-CM

## 2020-12-11 DIAGNOSIS — Z800 Family history of malignant neoplasm of digestive organs: Secondary | ICD-10-CM | POA: Diagnosis not present

## 2020-12-11 LAB — OCCULT BLOOD X 1 CARD TO LAB, STOOL
Fecal Occult Bld: NEGATIVE
Fecal Occult Bld: NEGATIVE
Fecal Occult Bld: NEGATIVE

## 2020-12-11 MED ORDER — SODIUM CHLORIDE 0.9 % IV SOLN
Freq: Once | INTRAVENOUS | Status: AC
  Filled 2020-12-11: qty 250

## 2020-12-11 MED ORDER — DIPHENHYDRAMINE HCL 25 MG PO CAPS
ORAL_CAPSULE | ORAL | Status: AC
  Filled 2020-12-11: qty 2

## 2020-12-11 MED ORDER — ACETAMINOPHEN 325 MG PO TABS
650.0000 mg | ORAL_TABLET | Freq: Once | ORAL | Status: AC
  Administered 2020-12-11: 650 mg via ORAL

## 2020-12-11 MED ORDER — ACETAMINOPHEN 325 MG PO TABS
ORAL_TABLET | ORAL | Status: AC
  Filled 2020-12-11: qty 2

## 2020-12-11 MED ORDER — SODIUM CHLORIDE 0.9 % IV SOLN
300.0000 mg | Freq: Once | INTRAVENOUS | Status: AC
  Administered 2020-12-11: 300 mg via INTRAVENOUS
  Filled 2020-12-11: qty 5

## 2020-12-11 MED ORDER — DIPHENHYDRAMINE HCL 25 MG PO CAPS
25.0000 mg | ORAL_CAPSULE | Freq: Once | ORAL | Status: AC
  Administered 2020-12-11: 25 mg via ORAL

## 2020-12-11 NOTE — Progress Notes (Signed)
Patient with complaints of "cramping" in legs, feet, back, random places" when taking PO iron supplements. Patient is concerned that she will experience this cramping with the iron infusions.  Cassie Heilingoetter, PA notified. Per Cassie, cramping usually is not associated with iron supplementation, but patient may try a different type of PO iron (Slow Fe, prenatal, or liquid) to see if this helps. Cassie also suggested patient follow-up with her PCP regarding the cramping. Patient informed of Cassie's response and verbalized understanding. Patient agreed to follow-up with PCP.

## 2020-12-11 NOTE — Patient Instructions (Signed)

## 2020-12-16 ENCOUNTER — Other Ambulatory Visit: Payer: Self-pay

## 2020-12-16 ENCOUNTER — Institutional Professional Consult (permissible substitution): Payer: 59 | Admitting: Neurology

## 2020-12-16 ENCOUNTER — Ambulatory Visit (INDEPENDENT_AMBULATORY_CARE_PROVIDER_SITE_OTHER): Payer: 59 | Admitting: Neurology

## 2020-12-16 ENCOUNTER — Other Ambulatory Visit: Payer: Self-pay | Admitting: Neurology

## 2020-12-16 ENCOUNTER — Ambulatory Visit (INDEPENDENT_AMBULATORY_CARE_PROVIDER_SITE_OTHER): Payer: 59 | Admitting: Bariatrics

## 2020-12-16 ENCOUNTER — Telehealth: Payer: Self-pay | Admitting: Neurology

## 2020-12-16 ENCOUNTER — Encounter: Payer: Self-pay | Admitting: Neurology

## 2020-12-16 VITALS — BP 129/76 | HR 88 | Ht 64.0 in | Wt 211.0 lb

## 2020-12-16 DIAGNOSIS — G43709 Chronic migraine without aura, not intractable, without status migrainosus: Secondary | ICD-10-CM

## 2020-12-16 DIAGNOSIS — E611 Iron deficiency: Secondary | ICD-10-CM | POA: Diagnosis not present

## 2020-12-16 DIAGNOSIS — H43313 Vitreous membranes and strands, bilateral: Secondary | ICD-10-CM | POA: Diagnosis not present

## 2020-12-16 HISTORY — DX: Iron deficiency: E61.1

## 2020-12-16 MED ORDER — VENLAFAXINE HCL ER 37.5 MG PO CP24: 37.5000 mg | ORAL_CAPSULE | Freq: Every day | ORAL | 6 refills | Status: DC

## 2020-12-16 MED ORDER — TIZANIDINE HCL 4 MG PO TABS: 4.0000 mg | ORAL_TABLET | Freq: Four times a day (QID) | ORAL | 6 refills | Status: DC | PRN

## 2020-12-16 MED ORDER — SUMATRIPTAN SUCCINATE 100 MG PO TABS: 100.0000 mg | ORAL_TABLET | Freq: Once | ORAL | 11 refills | Status: DC | PRN

## 2020-12-16 MED ORDER — ONDANSETRON 4 MG PO TBDP: 4.0000 mg | ORAL_TABLET | Freq: Three times a day (TID) | ORAL | 6 refills | Status: DC | PRN

## 2020-12-16 MED FILL — tiZANidine HCL 4 MG TABS: 4 | 8 days supply | Qty: 30 | Fill #0

## 2020-12-16 MED FILL — SUMAtriptan SUCCINATE 100 M: 100 | 30 days supply | Qty: 9 | Fill #0

## 2020-12-16 MED FILL — ONDANSETRON ODT 4 MG TABLET: 4 | 7 days supply | Qty: 20 | Fill #0

## 2020-12-16 MED FILL — VENLAFAXINE HCL ER 37.5 MG: 37.5 | 30 days supply | Qty: 30 | Fill #0

## 2020-12-16 NOTE — Telephone Encounter (Signed)
I spoke to [place] at [facility]. She confirmed that all four prescriptions sent in today are ready for pick up (sumatriptan, venlafaxine XR, tizanidine, ondansetron).   I called the patient and provided her with this information.

## 2020-12-16 NOTE — Progress Notes (Signed)
HISTORICAL  Julie Miranda is a 60 year old female, seen in request by her primary care physician nurse practitioner [name], for evaluation of chronic migraine, initial evaluation was April 30, 2020.  She works as a Psychologist, counselling for [employer]  I reviewed and summarized the referring note. She had a history of hypothyroidism, apparently noncompliant with her medications, multiple elevated TSH level, "sometimes I run out of the medication"  She reported lifelong history of migraine headaches, lateralized severe pounding headache with associated light noise sensitivity, nauseous, is usually stay on the right side, occasionally on the left side,  Over the years, she reported gradual worsening migraine headaches, half time of the month, she has to deal with different degree of migraine headaches, she tends to take 2 tablets of Excedrin Migraine, twice a day to help her migraine," sometimes I sleep with migraine, wake up with migraine  She was giving nortriptyline 25 mg as preventive medication, which did help her her sleep better, but she ran out of the medicine, has stopped taking it, not sure about the benefit for migraine  Laboratory evaluation showed normal CBC, CMP, TSH, was significantly elevated at 16.7,  UPDATE Dec 16 2020: She is accompanied by her mother Julie Miranda at today's clinical visit, she complains of worsening stress, depression anxiety, headaches, is taking Celexa 20 mg daily, however never tried take nortriptyline regularly, she described nortriptyline did help her go to sleep, but feeling tired after waking up,  She was recently defined to have significant iron deficiency anemia, ferritin level was only 4, hemoglobin of 8.1, receiving iron infusion, which has helped her symptoms, is going through GI evaluation for potential GI loss.  She complains of excessive fatigue, frequent bilateral frontal moderate to severe headaches, with associated light noise  sensitivity lasting hours, tried Maxalt with limited help,   Normal vitamin B12 498, vitamin D 27.1, CMP, creatinine 0.8, CBC, decreased WBC 3.5, hemoglobin of 8.1, elevated platelet count of 498, elevated RDW of 19.9  REVIEW OF SYSTEMS: Full 14 system review of systems performed and notable only for as above All other review of systems were negative.  ALLERGIES: Allergies  Allergen Reactions  . Morphine And Related Nausea And Vomiting    HOME MEDICATIONS: Current Outpatient Medications  Medication Sig Dispense Refill  . albuterol (VENTOLIN HFA) 108 (90 Base) MCG/ACT inhaler Inhale 2 puffs into the lungs every 6 (six) hours as needed for wheezing or shortness of breath. 8 g 0  . atorvastatin (LIPITOR) 40 MG tablet Take 1 tablet (40 mg total) by mouth every other day. 90 tablet 1  . busPIRone (BUSPAR) 10 MG tablet Take 1 tablet (10 mg total) by mouth 2 (two) times daily. 180 tablet 0  . citalopram (CELEXA) 20 MG tablet Take 1.5 tablets (30 mg total) by mouth daily. 145 tablet 1  . gabapentin (NEURONTIN) 100 MG capsule Take 3 capsules (300 mg total) by mouth at bedtime. 180 capsule 5  . Insulin Pen Needle (BD PEN NEEDLE NANO 2ND GEN) 32G X 4 MM MISC Use daily with Saxenda 100 each 0  . levothyroxine (SYNTHROID) 125 MCG tablet Take 1 tablet (125 mcg total) by mouth daily. 30 tablet 0  . Liraglutide -Weight Management (SAXENDA) 18 MG/3ML SOPN Inject 3 mg into the skin daily. 15 mL 0  . losartan (COZAAR) 25 MG tablet Take 1 tablet (25 mg total) by mouth daily. 90 tablet 1  . methocarbamol (ROBAXIN) 500 MG tablet Take 250 mg by mouth every  8 (eight) hours as needed for muscle spasms.    . nortriptyline (PAMELOR) 25 MG capsule Take 1-2 at bedtime if needed to sleep. 60 capsule 3  . pantoprazole (PROTONIX) 20 MG tablet Take as needed for acid reflux 1 daily 30 tablet 1  . rizatriptan (MAXALT-MLT) 10 MG disintegrating tablet Take 1 tablet (10 mg total) by mouth as needed. May repeat in 2 hours if  needed 15 tablet 6  . sucralfate (CARAFATE) 1 GM/10ML suspension Take 10 mLs (1 g total) by mouth 4 (four) times daily -  with meals and at bedtime. 420 mL 0  . Vitamin D, Ergocalciferol, (DRISDOL) 1.25 MG (50000 UNIT) CAPS capsule Take 1 capsule (50,000 Units total) by mouth every 7 (seven) days. 12 capsule 0  . zolpidem (AMBIEN) 5 MG tablet Take 1-2 tablets (5-10 mg total) by mouth at bedtime as needed for sleep. 30 tablet 0   No current facility-administered medications for this visit.    PAST MEDICAL HISTORY: Past Medical History:  Diagnosis Date  . Allergy   . Anemia   . Anxiety   . Arthritis   . Back pain   . Chest pain   . Colon polyps    x 3  . Depression   . Edema of both lower extremities   . Family history of adverse reaction to anesthesia    makes mother" loopy"  . Gastric ulcer   . GERD (gastroesophageal reflux disease)   . Graves disease   . Headache   . History of pancytopenia    of unknown etiology   . Hyperlipidemia   . Hypertension   . Hypothyroidism   . IBS (irritable bowel syndrome)   . Joint pain   . Migraine   . Osteoarthritis   . Thyroid disease   . Vitamin D deficiency     PAST SURGICAL HISTORY: Past Surgical History:  Procedure Laterality Date  . COLONOSCOPY    . GASTRIC BYPASS    . JOINT REPLACEMENT N/A    Phreesia 06/12/2020  . POLYPECTOMY    . TOTAL KNEE ARTHROPLASTY Left 07/30/2019   Procedure: LEFT TOTAL KNEE ARTHROPLASTY;  Surgeon: Leandrew Koyanagi, MD;  Location: [facility];  Service: Orthopedics;  Laterality: Left;    FAMILY HISTORY: Family History  Problem Relation Age of Onset  . Hypertension Mother   . Gout Mother   . Diabetes Mother   . High Cholesterol Mother   . Anxiety disorder Mother   . Obesity Mother   . Diabetes Maternal Aunt        x 2  . Heart disease Sister   . Pancreatic cancer Sister   . Liver cancer Maternal Aunt   . Stroke Father   . Colon cancer Neg Hx   . Kidney disease Neg Hx   . Colon polyps Neg Hx   .  Esophageal cancer Neg Hx   . Rectal cancer Neg Hx   . Stomach cancer Neg Hx     SOCIAL HISTORY: Social History   Socioeconomic History  . Marital status: Single    Spouse name: Not on file  . Number of children: 0  . Years of education: some college  . Highest education level: Not on file  Occupational History  . Occupation: Chartered certified accountant  Tobacco Use  . Smoking status: Never Smoker  . Smokeless tobacco: Never Used  Substance and Sexual Activity  . Alcohol use: Yes    Alcohol/week: 1.0 standard drink    Types: 1 Glasses  of wine per week    Comment: occasional  . Drug use: No  . Sexual activity: Not Currently    Birth control/protection: Injection  Other Topics Concern  . Not on file  Social History Narrative   Lives at home with her mother.   Right-handed.   One cup caffeine per day.   Social Determinants of Health   Financial Resource Strain: Not on file  Food Insecurity: Not on file  Transportation Needs: Not on file  Physical Activity: Not on file  Stress: Not on file  Social Connections: Not on file  Intimate Partner Violence: Not on file     PHYSICAL EXAM   Vitals:   12/16/20 1307  Weight: 211 lb (95.7 kg)  Height: 5\' 4"  (1.626 m)   Not recorded     Body mass index is 36.22 kg/m.  PHYSICAL EXAMNIATION:  Gen: NAD, conversant, well nourised, well groomed               NEUROLOGICAL EXAM:  MENTAL STATUS: Speech/cognition: Awake alert oriented to history taking and care of conversation.  CRANIAL NERVES: CN II: Visual fields are full to confrontation. Pupils are round equal and briskly reactive to light. CN III, IV, VI: extraocular movement are normal. No ptosis. CN V: Facial sensation is intact to light touch CN VII: Face is symmetric with normal eye closure  CN VIII: Hearing is normal to causal conversation. CN IX, X: Phonation is normal. CN XI: Head turning and shoulder shrug are intact  MOTOR: There is no pronator drift of out-stretched  arms. Muscle bulk and tone are normal. Muscle strength is normal.  REFLEXES: Reflexes are 2+ and symmetric at the biceps, triceps, knees, and ankles. Plantar responses are flexor.  SENSORY: Intact to light touch, pinprick and vibratory sensation are intact in fingers and toes.  COORDINATION: There is no trunk or limb dysmetria noted.  GAIT/STANCE: She needs push-up to get up from seated position, mildly antalgic, mildly unsteady   DIAGNOSTIC DATA (LABS, IMAGING, TESTING) - I reviewed patient records, labs, notes, testing and imaging myself where available.   ASSESSMENT AND PLAN  CAROLIE MCILRATH is a 60 y.o. female   Migraine headaches  Start Effexor XR 37.5 mg as preventive medications  Maxalt as needed with limited help  Will try Imitrex 100 mg as needed, may mix with Zofran, Aleve, tizanidine for prolonged severe headaches  Iron deficiency anemia of unknown cause,  She is going through extensive hematology, GI work-up, her iron deficiency anemia likely attributed to her constellation complaints,  Marcial Pacas, M.D. Ph.D.  [facility] [address], [place], [place] [address] Ph: [phone] Fax: [phone]  CC:  [name], NP

## 2020-12-16 NOTE — Addendum Note (Signed)
Addended by: Desmond Lope on: 12/16/2020 03:30 PM   Modules accepted: Orders

## 2020-12-16 NOTE — Telephone Encounter (Signed)
Pt. states she is missing one of the 4 medications that Dr. prescribed from pharmacy. Please advise.

## 2020-12-19 ENCOUNTER — Other Ambulatory Visit: Payer: Self-pay

## 2020-12-19 ENCOUNTER — Inpatient Hospital Stay: Payer: 59

## 2020-12-19 VITALS — BP 139/84 | HR 77 | Temp 98.3°F | Resp 18

## 2020-12-19 DIAGNOSIS — D509 Iron deficiency anemia, unspecified: Secondary | ICD-10-CM | POA: Diagnosis not present

## 2020-12-19 DIAGNOSIS — Z800 Family history of malignant neoplasm of digestive organs: Secondary | ICD-10-CM | POA: Diagnosis not present

## 2020-12-19 MED ORDER — ACETAMINOPHEN 325 MG PO TABS
ORAL_TABLET | ORAL | Status: AC
  Filled 2020-12-19: qty 2

## 2020-12-19 MED ORDER — SODIUM CHLORIDE 0.9 % IV SOLN
Freq: Once | INTRAVENOUS | Status: AC
  Filled 2020-12-19: qty 250

## 2020-12-19 MED ORDER — DIPHENHYDRAMINE HCL 25 MG PO CAPS
ORAL_CAPSULE | ORAL | Status: AC
  Filled 2020-12-19: qty 1

## 2020-12-19 MED ORDER — SODIUM CHLORIDE 0.9 % IV SOLN
300.0000 mg | Freq: Once | INTRAVENOUS | Status: AC
  Administered 2020-12-19: 300 mg via INTRAVENOUS
  Filled 2020-12-19: qty 10

## 2020-12-19 MED ORDER — DIPHENHYDRAMINE HCL 25 MG PO CAPS
25.0000 mg | ORAL_CAPSULE | Freq: Once | ORAL | Status: AC
  Administered 2020-12-19: 25 mg via ORAL

## 2020-12-19 MED ORDER — ACETAMINOPHEN 325 MG PO TABS
650.0000 mg | ORAL_TABLET | Freq: Once | ORAL | Status: AC
  Administered 2020-12-19: 650 mg via ORAL

## 2020-12-19 MED FILL — LOSARTAN POTASSIUM 25 MG TA: 25 | 30 days supply | Qty: 30 | Fill #0

## 2020-12-19 NOTE — Patient Instructions (Signed)

## 2020-12-22 ENCOUNTER — Telehealth: Payer: Self-pay | Admitting: Internal Medicine

## 2020-12-22 NOTE — Telephone Encounter (Signed)
Called pt per 2/28 sch msg - no answer . Left message for pt to call back .

## 2020-12-24 ENCOUNTER — Telehealth: Payer: Self-pay | Admitting: Nurse Practitioner

## 2020-12-24 ENCOUNTER — Other Ambulatory Visit: Payer: Self-pay | Admitting: Nurse Practitioner

## 2020-12-24 ENCOUNTER — Telehealth: Payer: Self-pay | Admitting: Registered Nurse

## 2020-12-24 ENCOUNTER — Encounter: Payer: Self-pay | Admitting: Nurse Practitioner

## 2020-12-24 ENCOUNTER — Other Ambulatory Visit: Payer: Self-pay

## 2020-12-24 ENCOUNTER — Ambulatory Visit (INDEPENDENT_AMBULATORY_CARE_PROVIDER_SITE_OTHER): Payer: 59 | Admitting: Nurse Practitioner

## 2020-12-24 VITALS — BP 128/88 | HR 73 | Ht 64.0 in | Wt 208.0 lb

## 2020-12-24 DIAGNOSIS — D509 Iron deficiency anemia, unspecified: Secondary | ICD-10-CM | POA: Diagnosis not present

## 2020-12-24 DIAGNOSIS — R1084 Generalized abdominal pain: Secondary | ICD-10-CM

## 2020-12-24 DIAGNOSIS — K5900 Constipation, unspecified: Secondary | ICD-10-CM

## 2020-12-24 DIAGNOSIS — R140 Abdominal distension (gaseous): Secondary | ICD-10-CM

## 2020-12-24 DIAGNOSIS — K219 Gastro-esophageal reflux disease without esophagitis: Secondary | ICD-10-CM | POA: Diagnosis not present

## 2020-12-24 MED ORDER — PANTOPRAZOLE SODIUM 40 MG PO TBEC: 40.0000 mg | DELAYED_RELEASE_TABLET | Freq: Every day | ORAL | 3 refills | Status: DC

## 2020-12-24 MED ORDER — DICYCLOMINE HCL 20 MG PO TABS: 20.0000 mg | ORAL_TABLET | Freq: Three times a day (TID) | ORAL | 0 refills | Status: DC

## 2020-12-24 MED FILL — PANTOPRAZOLE SOD DR 40 MG T: 40 | 90 days supply | Qty: 90 | Fill #0

## 2020-12-24 MED FILL — DICYCLOMINE 20 MG TABLET: 20 | 20 days supply | Qty: 60 | Fill #0

## 2020-12-24 NOTE — Telephone Encounter (Signed)
This is not a patient of [facility].

## 2020-12-24 NOTE — Telephone Encounter (Signed)
Copied from [place] [id number]. Topic: Medical Record Request - Other >> Dec 24, 2020  9:18 AM Loma Boston wrote: Patient Name/DOB/MRN #: Julie Miranda  Requestor Name/Agency: pt Call Back #: [phone] Information Requested: Pt was seeing Dr Margarita Rana as PCP and had taken something for constipation and had reaction and was wanting a cb from nurse as having same issue again and new pcp wanted her to inguire name of drug   Route to [facility] for [place] clinics. For all other clinics, route to the clinic's PEC Pool.   Patient used to be a patient of Dr. Smitty Pluck, she has not been seen in three years. We would need a medical records request to release any of this information. Please advise if appropriate.

## 2020-12-24 NOTE — Progress Notes (Signed)
ASSESSMENT AND PLAN     # 60 yo female with chronic constipation / reported history of IBS here with generalized crampy abdominal pain ( worse in lower abdomen) and mainly related to eating.  Constipation could be contributing factor --Treat constipation --Trial of Bentyl prior to meals.  --Follow up 3-4 weeks. Consider FDguard if not improving.   # GERD. Belching better on pantoprazole 20 mg but still with burning in throat.  --Increase Pantoprazole to 40 mg 30 minutes prior to breakfast.  --anti-reflux measures discussed. GERD literature given  # Bloating / early satiety without weight loss. Constipation possibly contributing to bloating.   --Low gas diet given --Treat constipation.   # Chronic constipation.  --Stop Miralax --Consider trial of Amitiza . Patient unsure if she has tried it before. She may have a bottle at home, will call and let us know. If has already tried Amitiza then consider Motegrity.   # Obesity. She took [place] for a couple of months but stopped it in January due to cost.  --I explained to her that if she resumes Saxenda it may exacerbate some of her upper GI symptoms.    # Iron deficiency anemia. She has chronic anemia / history of Roux-en-Y gastric bypass in 2004. Followed by Hematology.  Bone marrow biopsy and extensive labs were unremarkable several years ago.  Baseline hgb 10-11 but it has been drifting since December to 8.1. Ferritin is 4. No overt GI bleeding.  Recent hemoccults negative x 3. SPEP normal. She is getting IV iron, last iron infusion was last week and she is due for another on 12/31/20 --If doesn't respond to iron infusions or if upper GI symptoms do not improve then consider endoscopic evaluation. Will see her again in 3-4 weeks.  # History of colon polyps.  A 5 mm tubular adenoma was removed at time of last colonoscopy September 2018  # Hypothyroidism, last TSH low in December. PCP monitoring  HISTORY OF PRESENT ILLNESS      Primary Gastroenterologist : Zenovia Jarred, MD  Chief Complaint : upper and lower abdominal pain,  belching, bloating, burning in throat,  nausea chronic constipation .    Julie Miranda is a 60 y.o. female with PMH / [place] significant for adenomatous colon polyps, remote PUD, roux-en-Y gastric bypass in 2004, obesity, migraines, osteoarthritis, depression, hypothyroidism    Patient is here with multiple GI complaints at she is referred by PCP for abdominal pain. She describes intermittent crampy generalized abdominal pain,  mainly during or after a meal. Pain is in upper and lower abdomen but the lower abdominal pain is more prominent.  Episodes last 30-45 mintures but may reoccur later in day with another meal. Pain not relieved with defecation. PCP gave her Carafate for the pain but she only takes it once daily AFTER dinner because she eats breakfast and lunch at work and doesn't take the medication with her. The carafate does seem to help. She complains of bloating despite whether she eats. She gives a history of IBS and chronic constipation. She takes Miralax about three times a week but it doesn't consistently work for her. Sometimes she has to take MOM. Over the last couple of weeks she has had a few episodes of loose stool..     Patient gives a history GERD with belching and burning in throat.  Symptoms improved so she stopped Pantoprazole last year. Symptoms returned a couple of months later however she just resumed pantoprazole last month.  So far belching has improved but she is still having burning in throat. She also complains of early satiety. Her weight is up several pounds. She took [place] for 3 months, stopped It early January because insurance wouldn't pay for it.   Patient feels like Miralax isn't working well anymore though she can't take everyday due to her work schedule. She tried a prescription medication for constipation~  3 years ago which worked well for a couple of months  then lost effecacy.   Tawnia has a history of chronic anemia. She is being followed by Hematology. Baseline hgb 10--11 with recent drop to 8.1. Ferritin 4. Got iron infusion last week, due for another next week. No overt GI bleeding. No hematuria or vaginal bleeding.   Patient says she struggles with anxiety and depression. She has both celexa and Effexor on home med list.   Previous Endoscopic Evaluations / Pertinent Studies:  Sept 2018 polyp surveillance colonoscopy  -The perianal and digital rectal examinations were normal. - A 5 mm polyp was found in the sigmoid colon. The polyp was sessile. The polyp wasremoved with a cold snare. Resection and retrieval were complete. - A few small-mouthed diverticula were found in the sigmoid colon and ascending colon. - Internal hemorrhoids were found during retroflexion. The hemorrhoids were small.  Path = tubular adenoma  Past Medical History:  Diagnosis Date  . Allergy   . Anemia   . Anxiety   . Arthritis   . Colon polyps    x 3  . Depression   . Family history of adverse reaction to anesthesia    makes mother" loopy"  . Gastric ulcer   . GERD (gastroesophageal reflux disease)   . Graves disease   . Headache   . History of pancytopenia    of unknown etiology   . Hyperlipidemia   . Hypertension   . Hypothyroidism   . IBS (irritable bowel syndrome)   . Migraine   . Osteoarthritis   . Vitamin D deficiency      Past Surgical History:  Procedure Laterality Date  . COLONOSCOPY    . GASTRIC BYPASS    . JOINT REPLACEMENT N/A    Phreesia 06/12/2020  . POLYPECTOMY    . TOTAL KNEE ARTHROPLASTY Left 07/30/2019   Procedure: LEFT TOTAL KNEE ARTHROPLASTY;  Surgeon: Leandrew Koyanagi, MD;  Location: [facility];  Service: Orthopedics;  Laterality: Left;   Family History  Problem Relation Age of Onset  . Hypertension Mother   . Gout Mother   . Diabetes Mother   . High Cholesterol Mother   . Anxiety disorder Mother   . Obesity Mother   .  Diabetes Maternal Aunt        x 2  . Heart disease Sister   . Pancreatic cancer Sister   . Liver cancer Maternal Aunt   . Stroke Father   . Colon cancer Neg Hx   . Kidney disease Neg Hx   . Colon polyps Neg Hx   . Esophageal cancer Neg Hx   . Rectal cancer Neg Hx   . Stomach cancer Neg Hx    Social History   Tobacco Use  . Smoking status: Never Smoker  . Smokeless tobacco: Never Used  Vaping Use  . Vaping Use: Never used  Substance Use Topics  . Alcohol use: Yes    Comment: occasional  . Drug use: No   Current Outpatient Medications  Medication Sig Dispense Refill  . albuterol (VENTOLIN HFA) 108 (90  Base) MCG/ACT inhaler Inhale 2 puffs into the lungs every 6 (six) hours as needed for wheezing or shortness of breath. 8 g 0  . atorvastatin (LIPITOR) 40 MG tablet Take 1 tablet (40 mg total) by mouth every other day. 90 tablet 1  . busPIRone (BUSPAR) 10 MG tablet Take 1 tablet (10 mg total) by mouth 2 (two) times daily. 180 tablet 0  . citalopram (CELEXA) 20 MG tablet Take 1.5 tablets (30 mg total) by mouth daily. 145 tablet 1  . gabapentin (NEURONTIN) 100 MG capsule Take 300 mg by mouth every other day.    . Insulin Pen Needle (BD PEN NEEDLE NANO 2ND GEN) 32G X 4 MM MISC Use daily with Saxenda 100 each 0  . levothyroxine (SYNTHROID) 125 MCG tablet Take 1 tablet (125 mcg total) by mouth daily. 30 tablet 0  . Liraglutide -Weight Management (SAXENDA) 18 MG/3ML SOPN Inject 3 mg into the skin daily. 15 mL 0  . losartan (COZAAR) 25 MG tablet Take 1 tablet (25 mg total) by mouth daily. 90 tablet 1  . methocarbamol (ROBAXIN) 500 MG tablet Take 250 mg by mouth every 8 (eight) hours as needed for muscle spasms.    . ondansetron (ZOFRAN ODT) 4 MG disintegrating tablet Take 1 tablet (4 mg total) by mouth every 8 (eight) hours as needed. 20 tablet 6  . pantoprazole (PROTONIX) 20 MG tablet Take 20 mg by mouth daily.    . sucralfate (CARAFATE) 1 GM/10ML suspension Take 10 mLs (1 g total) by  mouth 4 (four) times daily -  with meals and at bedtime. 420 mL 0  . SUMAtriptan (IMITREX) 100 MG tablet Take 1 tablet (100 mg total) by mouth once as needed for up to 1 dose for migraine. May repeat in 2 hours if headache persists or recurs. 12 tablet 11  . tiZANidine (ZANAFLEX) 4 MG tablet Take 1 tablet (4 mg total) by mouth every 6 (six) hours as needed. 30 tablet 6  . venlafaxine XR (EFFEXOR XR) 37.5 MG 24 hr capsule Take 1 capsule (37.5 mg total) by mouth daily with breakfast. 30 capsule 6  . Vitamin D, Ergocalciferol, (DRISDOL) 1.25 MG (50000 UNIT) CAPS capsule Take 1 capsule (50,000 Units total) by mouth every 7 (seven) days. 12 capsule 0  . zolpidem (AMBIEN) 5 MG tablet Take 1-2 tablets (5-10 mg total) by mouth at bedtime as needed for sleep. 30 tablet 0   No current facility-administered medications for this visit.   Allergies  Allergen Reactions  . Morphine And Related Nausea And Vomiting     Review of Systems: Positive for anxiety, arthritis, back pain, vision changes, confusion, depression, fatigue, headaches, shortness of breath, sleeping problems, excessive urination.  All other systems reviewed and negative except where noted in HPI.   PHYSICAL EXAM :    Wt Readings from Last 3 Encounters:  12/24/20 208 lb (94.3 kg)  12/16/20 211 lb (95.7 kg)  12/01/20 209 lb 4.8 oz (94.9 kg)    BP 128/88   Pulse 73   Ht _0  (1.626 m)   Wt 208 lb (94.3 kg)   LMP  (LMP Unknown) Comment: pt. states that it has been over 1 year  SpO2 99%   BMI 35.70 kg/m  Constitutional:  Pleasant female in no acute distress. Psychiatric: Normal mood and affect. Behavior is normal. EENT: Pupils normal.  Conjunctivae are normal. No scleral icterus. Neck supple.  Cardiovascular: Normal rate, regular rhythm. No edema Pulmonary/chest: Effort normal and breath sounds  normal. No wheezing, rales or rhonchi. Abdominal: Soft, nondistended, nontender. Bowel sounds active throughout. There are no masses  palpable. No hepatomegaly. Neurological: Alert and oriented to person place and time. Skin: Skin is warm and dry. No rashes noted.  Tye Savoy, NP  12/24/2020, 8:45 AM  Cc:  Referring Provider [name], NP

## 2020-12-24 NOTE — Patient Instructions (Addendum)
If you are age 59 or younger, your body mass index should be between 19-25. Your Body mass index is 35.7 kg/m. If this is out of the aformentioned range listed, please consider follow up with your Primary Care Provider.   We have scheduled you a 4 week follow up on 01/23/21 at 2:30pm.  Please call us or send a MyChart message about what you have been using for constipation.  Please increase Pantoprazole to 40 MG once a day, we sent in a new prescription.  MEDICATION We have sent the following medication to your pharmacy for you to pick up at your convenience: Dicyclomine 20 MG take 3 times a day only as needed for abdominal pain.  It was great seeing you today! Thank you for entrusting me with your care and choosing [facility].

## 2020-12-24 NOTE — Telephone Encounter (Signed)
Patient called to let Tye Savoy know that she was previously given Amitiza medication.

## 2020-12-24 NOTE — Telephone Encounter (Signed)
Copied from [place] [id number]. Topic: Medical Record Request - Other >> Dec 24, 2020  9:18 AM Loma Boston wrote: Patient Name/DOB/MRN #: Julie Miranda  Requestor Name/Agency: pt Call Back #: [phone] Information Requested: Pt was seeing Dr Margarita Rana as PCP and had taken something for constipation and had reaction and was wanting a cb from nurse as having same issue again and new pcp wanted her to inguire name of drug   Route to [facility] for [place] clinics. For all other clinics, route to the clinic's PEC Pool.

## 2020-12-25 ENCOUNTER — Telehealth: Payer: Self-pay | Admitting: Internal Medicine

## 2020-12-25 MED FILL — tiZANidine HCL 4 MG TABS: 4 | 8 days supply | Qty: 30 | Fill #1

## 2020-12-25 NOTE — Telephone Encounter (Signed)
Called pt per 3/3 sch msg - no answer. Left message for patient to call back to reschedule.   

## 2020-12-26 ENCOUNTER — Other Ambulatory Visit: Payer: Self-pay | Admitting: Nurse Practitioner

## 2020-12-26 ENCOUNTER — Ambulatory Visit: Payer: 59

## 2020-12-26 MED ORDER — LINACLOTIDE 72 MCG PO CAPS: 72.0000 ug | ORAL_CAPSULE | Freq: Every day | ORAL | 3 refills | Status: DC

## 2020-12-26 NOTE — Telephone Encounter (Signed)
Beth, please call patient and tell her that will can try Linzess since she has already tried Amitiza. Let's give her the lowest dose of 72 mcg daily on an empty stomach, 3 refills please. Thanks

## 2020-12-26 NOTE — Telephone Encounter (Signed)
You can try Trulance 3 mg but I am not hopeful that insurance will cover that either.  Another alternative is to find out what strength of Amitiza she was on.  If she was not taking the 16 mcg twice daily then we can try that.  Thanks

## 2020-12-26 NOTE — Telephone Encounter (Signed)
Patient notified of the new recommendations.  Rx sent to [facility] pharmacy. Patient says they do not accept the manufacturer coupons.  She will check on the price at [facility] and call us back if not affordable and we will send Rx to alternative pharmacy that can use the coupons.

## 2020-12-26 NOTE — Progress Notes (Signed)
Addendum: Reviewed and agree with assessment and management plan. Brendolyn Stockley M, MD  

## 2020-12-26 NOTE — Telephone Encounter (Signed)
Linzess too expensive even with coupon, over $200.  Please advise alternative

## 2020-12-29 ENCOUNTER — Ambulatory Visit (INDEPENDENT_AMBULATORY_CARE_PROVIDER_SITE_OTHER): Payer: 59 | Admitting: Adult Health

## 2020-12-29 ENCOUNTER — Other Ambulatory Visit: Payer: Self-pay | Admitting: Nurse Practitioner

## 2020-12-29 ENCOUNTER — Encounter (INDEPENDENT_AMBULATORY_CARE_PROVIDER_SITE_OTHER): Payer: Self-pay | Admitting: Adult Health

## 2020-12-29 ENCOUNTER — Other Ambulatory Visit: Payer: Self-pay

## 2020-12-29 VITALS — BP 109/69 | HR 72 | Temp 98.1°F | Ht 64.0 in | Wt 204.0 lb

## 2020-12-29 DIAGNOSIS — D509 Iron deficiency anemia, unspecified: Secondary | ICD-10-CM

## 2020-12-29 DIAGNOSIS — Z9189 Other specified personal risk factors, not elsewhere classified: Secondary | ICD-10-CM | POA: Diagnosis not present

## 2020-12-29 DIAGNOSIS — E559 Vitamin D deficiency, unspecified: Secondary | ICD-10-CM

## 2020-12-29 DIAGNOSIS — E039 Hypothyroidism, unspecified: Secondary | ICD-10-CM

## 2020-12-29 DIAGNOSIS — Z6835 Body mass index (BMI) 35.0-35.9, adult: Secondary | ICD-10-CM

## 2020-12-29 MED ORDER — LUBIPROSTONE 24 MCG PO CAPS: 24.0000 ug | ORAL_CAPSULE | Freq: Two times a day (BID) | ORAL | 3 refills | Status: DC

## 2020-12-29 NOTE — Addendum Note (Signed)
Addended by: Marlon Pel on: 12/29/2020 09:24 AM   Modules accepted: Orders

## 2020-12-29 NOTE — Telephone Encounter (Signed)
Patient hasn't been on Amitiza since 2017.  She is willing to try the 24 mcg dose and will call back to let us know if it helps.

## 2020-12-30 ENCOUNTER — Encounter (INDEPENDENT_AMBULATORY_CARE_PROVIDER_SITE_OTHER): Payer: Self-pay | Admitting: Adult Health

## 2020-12-30 ENCOUNTER — Other Ambulatory Visit (INDEPENDENT_AMBULATORY_CARE_PROVIDER_SITE_OTHER): Payer: Self-pay | Admitting: Adult Health

## 2020-12-30 LAB — TSH: TSH: 0.963 u[IU]/mL (ref 0.450–4.500)

## 2020-12-30 MED ORDER — LEVOTHYROXINE SODIUM 125 MCG PO TABS: 125.0000 ug | ORAL_TABLET | Freq: Every day | ORAL | 0 refills | Status: DC

## 2020-12-30 MED FILL — LEVOTHYROXINE SODIUM 125 MC: 125 | 30 days supply | Qty: 30 | Fill #0

## 2020-12-30 NOTE — Progress Notes (Signed)
Chief Complaint:   OBESITY Julie Miranda is here to discuss her progress with her obesity treatment plan along with follow-up of her obesity related diagnoses. Julie Miranda is on the Category 1 Plan and states she is following her eating plan approximately 50% of the time. Julie Miranda states she is biking 30-45 minutes 3 times per week.  Today's visit was #: 7 Starting weight: 206 lbs Starting date: 07/02/2020 Today's weight: 204 lbs Today's date: 12/29/2020 Total lbs lost to date: 2 lbs Total lbs lost since last in-office visit: 0  Interim History: Julie Miranda had been on Saxenda 3 mg QD since 07/2020, however she has not met her deductible for the year and cannot afford the cost of prescription.  She has been off [place] for several weeks. She is also unable to afford the various medications that GI has prescribed to address her chronic idiopathic constipation.  Subjective:   1. Hypothyroidism, unspecified type Julie Miranda's TSH 10/03/2020 was low at 0.232. Her levothyroxine was decreased to 125 mcg QD. Her fatigue level is the same.   2. Iron deficiency anemia, unspecified iron deficiency anemia type Julie Miranda will have her final iron infusion next week. She denies hematochezia, hemoptysis, or unusual bruising.   3. Vitamin D deficiency Julie Miranda's Vitamin D level was 27.1 on 10/03/2020, which is below goal of 50. She is currently taking prescription vitamin D 50,000 IU each week. She denies nausea, vomiting or muscle weakness.   Ref. Range 10/03/2020 10:31  Vitamin D, 25-Hydroxy Latest Ref Range: 30.0 - 100.0 ng/mL 27.1 (L)   4. At risk for deficient intake of food Julie Miranda is at risk for deficient intake of food due to chronic idiopathic constipation.  Assessment/Plan:   1. Hypothyroidism, unspecified type Check labs today and refill Synthroid after lab results. Orders and follow up as documented in patient record.  Counseling . Good thyroid control is important for overall health. Supratherapeutic  thyroid levels are dangerous and will not improve weight loss results. . Counseling: The correct way to take levothyroxine is fasting, with water, separated by at least 30 minutes from breakfast, and separated by more than 4 hours from calcium, iron, multivitamins, acid reflux medications (PPIs).   - TSH  2. Iron deficiency anemia, unspecified iron deficiency anemia type Complete Iron Infusion series and follow up with hematologist as directed.  3. Vitamin D deficiency Low Vitamin D level contributes to fatigue and are associated with obesity, breast, and colon cancer. She agrees to continue to take prescription Vitamin D _0 ,000 IU every week and will follow-up for routine testing of Vitamin D, at least 2-3 times per year to avoid over-replacement. No refill needed today.  4. At risk for deficient intake of food Julie Miranda was given approximately 15 minutes of deficit intake of food prevention counseling today. Julie Miranda is at risk for eating too few calories based on current food recall. She was encouraged to focus on meeting caloric and protein goals according to her recommended meal plan.   5. Class 2 severe obesity with serious comorbidity and body mass index (BMI) of 35.0 to 35.9 in adult, unspecified obesity type (HCC) Julie Miranda is currently in the action stage of change. As such, her goal is to continue with weight loss efforts. She has agreed to the Category 1 Plan.   Exercise goals: As is  Behavioral modification strategies: increasing lean protein intake, decreasing simple carbohydrates, meal planning and cooking strategies and planning for success.  Julie Miranda has agreed to follow-up with our clinic in 2 weeks.  She was informed of the importance of frequent follow-up visits to maximize her success with intensive lifestyle modifications for her multiple health conditions.   Julie Miranda was informed we would discuss her lab results at her next visit unless there is a critical issue that needs to be  addressed sooner. Julie Miranda agreed to keep her next visit at the agreed upon time to discuss these results.  Objective:   Blood pressure 109/69, pulse 72, temperature 98.1 F (36.7 C), height _0  (1.626 m), weight 204 lb (92.5 kg), SpO2 99 %. Body mass index is 35.02 kg/m.  General: Cooperative, alert, well developed, in no acute distress. HEENT: Conjunctivae and lids unremarkable. Cardiovascular: Regular rhythm.  Lungs: Normal work of breathing. Neurologic: No focal deficits.   Lab Results  Component Value Date   CREATININE 0.80 12/01/2020   BUN 9 12/01/2020   NA 142 12/01/2020   K 4.1 12/01/2020   CL 109 12/01/2020   CO2 27 12/01/2020   Lab Results  Component Value Date   ALT 15 12/01/2020   AST 21 12/01/2020   ALKPHOS 95 12/01/2020   BILITOT 0.3 12/01/2020   Lab Results  Component Value Date   HGBA1C 5.5 07/02/2020   HGBA1C 5.2 11/27/2014   Lab Results  Component Value Date   INSULIN 8.1 07/02/2020   Lab Results  Component Value Date   TSH 0.963 12/29/2020   Lab Results  Component Value Date   CHOL 216 (H) 07/02/2020   HDL 79 07/02/2020   LDLCALC 120 (H) 07/02/2020   TRIG 100 07/02/2020   CHOLHDL 3.1 04/02/2020   Lab Results  Component Value Date   WBC 3.5 (L) 12/01/2020   HGB 8.1 (L) 12/01/2020   HCT 28.3 (L) 12/01/2020   MCV 79.3 (L) 12/01/2020   PLT 498 (H) 12/01/2020   Lab Results  Component Value Date   IRON 22 (L) 12/01/2020   TIBC 523 (H) 12/01/2020   FERRITIN 4 (L) 12/01/2020     Attestation Statements:   Reviewed by clinician on day of visit: allergies, medications, problem list, medical history, surgical history, family history, social history, and previous encounter notes.  Coral Ceo, am acting as Location manager for Mina Marble, NP.  I have reviewed the above documentation for accuracy and completeness, and I agree with the above. -  Emeterio Balke d. Anaiah Mcmannis,NP-C

## 2020-12-31 ENCOUNTER — Other Ambulatory Visit: Payer: Self-pay

## 2020-12-31 ENCOUNTER — Ambulatory Visit: Payer: 59

## 2020-12-31 ENCOUNTER — Inpatient Hospital Stay: Payer: 59 | Attending: Physician Assistant

## 2020-12-31 VITALS — BP 149/78 | HR 84 | Temp 97.7°F | Resp 18

## 2020-12-31 DIAGNOSIS — D508 Other iron deficiency anemias: Secondary | ICD-10-CM | POA: Diagnosis not present

## 2020-12-31 DIAGNOSIS — E039 Hypothyroidism, unspecified: Secondary | ICD-10-CM | POA: Insufficient documentation

## 2020-12-31 DIAGNOSIS — Z800 Family history of malignant neoplasm of digestive organs: Secondary | ICD-10-CM | POA: Diagnosis not present

## 2020-12-31 DIAGNOSIS — Z79899 Other long term (current) drug therapy: Secondary | ICD-10-CM | POA: Diagnosis not present

## 2020-12-31 DIAGNOSIS — K909 Intestinal malabsorption, unspecified: Secondary | ICD-10-CM | POA: Insufficient documentation

## 2020-12-31 DIAGNOSIS — Z9884 Bariatric surgery status: Secondary | ICD-10-CM | POA: Insufficient documentation

## 2020-12-31 DIAGNOSIS — K589 Irritable bowel syndrome without diarrhea: Secondary | ICD-10-CM | POA: Diagnosis not present

## 2020-12-31 DIAGNOSIS — I10 Essential (primary) hypertension: Secondary | ICD-10-CM | POA: Diagnosis not present

## 2020-12-31 DIAGNOSIS — D509 Iron deficiency anemia, unspecified: Secondary | ICD-10-CM

## 2020-12-31 MED ORDER — SODIUM CHLORIDE 0.9 % IV SOLN
Freq: Once | INTRAVENOUS | Status: AC
  Filled 2020-12-31: qty 250

## 2020-12-31 MED ORDER — DIPHENHYDRAMINE HCL 25 MG PO CAPS
25.0000 mg | ORAL_CAPSULE | Freq: Once | ORAL | Status: AC
  Administered 2020-12-31: 25 mg via ORAL

## 2020-12-31 MED ORDER — ACETAMINOPHEN 325 MG PO TABS
650.0000 mg | ORAL_TABLET | Freq: Once | ORAL | Status: AC
  Administered 2020-12-31: 650 mg via ORAL

## 2020-12-31 MED ORDER — SODIUM CHLORIDE 0.9 % IV SOLN
300.0000 mg | Freq: Once | INTRAVENOUS | Status: AC
  Administered 2020-12-31: 300 mg via INTRAVENOUS
  Filled 2020-12-31: qty 10

## 2020-12-31 MED ORDER — DIPHENHYDRAMINE HCL 25 MG PO CAPS
ORAL_CAPSULE | ORAL | Status: AC
  Filled 2020-12-31: qty 2

## 2020-12-31 MED ORDER — ACETAMINOPHEN 325 MG PO TABS
ORAL_TABLET | ORAL | Status: AC
  Filled 2020-12-31: qty 2

## 2020-12-31 NOTE — Progress Notes (Signed)
Patient declined to stay for 30 minute post observation. Upon discharge, vitals were stable and patient in no distress.

## 2020-12-31 NOTE — Patient Instructions (Signed)

## 2021-01-12 ENCOUNTER — Inpatient Hospital Stay (HOSPITAL_BASED_OUTPATIENT_CLINIC_OR_DEPARTMENT_OTHER): Payer: 59 | Admitting: Internal Medicine

## 2021-01-12 ENCOUNTER — Other Ambulatory Visit: Payer: Self-pay

## 2021-01-12 ENCOUNTER — Inpatient Hospital Stay: Payer: 59

## 2021-01-12 VITALS — BP 157/89 | HR 83 | Temp 96.5°F | Resp 20 | Ht 64.0 in | Wt 212.0 lb

## 2021-01-12 DIAGNOSIS — D509 Iron deficiency anemia, unspecified: Secondary | ICD-10-CM

## 2021-01-12 DIAGNOSIS — E039 Hypothyroidism, unspecified: Secondary | ICD-10-CM | POA: Diagnosis not present

## 2021-01-12 DIAGNOSIS — D508 Other iron deficiency anemias: Secondary | ICD-10-CM

## 2021-01-12 DIAGNOSIS — E611 Iron deficiency: Secondary | ICD-10-CM | POA: Diagnosis not present

## 2021-01-12 DIAGNOSIS — Z800 Family history of malignant neoplasm of digestive organs: Secondary | ICD-10-CM | POA: Diagnosis not present

## 2021-01-12 DIAGNOSIS — K589 Irritable bowel syndrome without diarrhea: Secondary | ICD-10-CM | POA: Diagnosis not present

## 2021-01-12 DIAGNOSIS — K909 Intestinal malabsorption, unspecified: Secondary | ICD-10-CM | POA: Diagnosis not present

## 2021-01-12 DIAGNOSIS — Z9884 Bariatric surgery status: Secondary | ICD-10-CM | POA: Diagnosis not present

## 2021-01-12 DIAGNOSIS — I10 Essential (primary) hypertension: Secondary | ICD-10-CM | POA: Diagnosis not present

## 2021-01-12 DIAGNOSIS — Z79899 Other long term (current) drug therapy: Secondary | ICD-10-CM | POA: Diagnosis not present

## 2021-01-12 LAB — CBC WITH DIFFERENTIAL (CANCER CENTER ONLY)
Abs Immature Granulocytes: 0 10*3/uL (ref 0.00–0.07)
Basophils Absolute: 0.1 10*3/uL (ref 0.0–0.1)
Basophils Relative: 2 %
Eosinophils Absolute: 0.2 10*3/uL (ref 0.0–0.5)
Eosinophils Relative: 7 %
HCT: 37.5 % (ref 36.0–46.0)
Hemoglobin: 10.9 g/dL — ABNORMAL LOW (ref 12.0–15.0)
Immature Granulocytes: 0 %
Lymphocytes Relative: 53 %
Lymphs Abs: 1.4 10*3/uL (ref 0.7–4.0)
MCH: 25.3 pg — ABNORMAL LOW (ref 26.0–34.0)
MCHC: 29.1 g/dL — ABNORMAL LOW (ref 30.0–36.0)
MCV: 87.2 fL (ref 80.0–100.0)
Monocytes Absolute: 0.2 10*3/uL (ref 0.1–1.0)
Monocytes Relative: 7 %
Neutro Abs: 0.8 10*3/uL — ABNORMAL LOW (ref 1.7–7.7)
Neutrophils Relative %: 31 %
Platelet Count: 227 10*3/uL (ref 150–400)
RBC: 4.3 MIL/uL (ref 3.87–5.11)
RDW: 24.4 % — ABNORMAL HIGH (ref 11.5–15.5)
WBC Count: 2.6 10*3/uL — ABNORMAL LOW (ref 4.0–10.5)
nRBC: 0 % (ref 0.0–0.2)

## 2021-01-12 LAB — IRON AND TIBC
Iron: 66 ug/dL (ref 41–142)
Saturation Ratios: 17 % — ABNORMAL LOW (ref 21–57)
TIBC: 378 ug/dL (ref 236–444)
UIBC: 312 ug/dL (ref 120–384)

## 2021-01-12 LAB — FERRITIN: Ferritin: 81 ng/mL (ref 11–307)

## 2021-01-12 NOTE — Progress Notes (Signed)
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  PRIOR THERAPY: Intravenous iron infusion with Venofer 300 mg weekly x3 weeks last dose was given December 26, 2020.  CURRENT THERAPY: None.  INTERVAL HISTORY: Julie Miranda 60 y.o. female returns to the clinic today for follow-up visit.  The patient continues to complain of fatigue and weakness.  She is under a lot of stress from her job.  She denied having any current chest pain, shortness of breath, cough or hemoptysis.  The patient has no weight loss or night sweats.  She has no nausea, vomiting, diarrhea or constipation.  She denied having any headache or visual changes.  She tolerated her previous iron infusion fairly well.  She is here today for evaluation with repeat CBC, iron study and ferritin.  MEDICAL HISTORY: Past Medical History:  Diagnosis Date  . Allergy   . Anemia   . Anxiety   . Arthritis   . Back pain   . Chest pain   . Colon polyps    x 3  . Depression   . Edema of both lower extremities   . Family history of adverse reaction to anesthesia    makes mother" loopy"  . Gastric ulcer   . GERD (gastroesophageal reflux disease)   . Graves disease   . Headache   . History of pancytopenia    of unknown etiology   . Hyperlipidemia   . Hypertension   . Hypothyroidism   . IBS (irritable bowel syndrome)   . Joint pain   . Migraine   . Osteoarthritis   . Thyroid disease   . Vitamin D deficiency     ALLERGIES:  is allergic to morphine and related.  MEDICATIONS:  Current Outpatient Medications  Medication Sig Dispense Refill  . albuterol (VENTOLIN HFA) 108 (90 Base) MCG/ACT inhaler Inhale 2 puffs into the lungs every 6 (six) hours as needed for wheezing or shortness of breath. 8 g 0  . atorvastatin (LIPITOR) 40 MG  tablet Take 1 tablet (40 mg total) by mouth every other day. 90 tablet 1  . busPIRone (BUSPAR) 10 MG tablet Take 1 tablet (10 mg total) by mouth 2 (two) times daily. 180 tablet 0  . citalopram (CELEXA) 20 MG tablet Take 1.5 tablets (30 mg total) by mouth daily. 145 tablet 1  . dicyclomine (BENTYL) 20 MG tablet Take 1 tablet (20 mg total) by mouth 3 (three) times daily before meals. Take only as needed for abdominal pain. 60 tablet 0  . gabapentin (NEURONTIN) 100 MG capsule Take 300 mg by mouth every other day.    . levothyroxine (SYNTHROID) 125 MCG tablet Take 1 tablet (125 mcg total) by mouth daily. 30 tablet 0  . losartan (COZAAR) 25 MG tablet Take 1 tablet (25 mg total) by mouth daily. 90 tablet 1  . lubiprostone (AMITIZA) 24 MCG capsule Take 1 capsule (24 mcg total) by mouth 2 (two) times daily with a meal. 60 capsule 3  . methocarbamol (ROBAXIN) 500 MG tablet Take 250 mg by mouth every 8 (eight) hours as needed for muscle spasms.    . ondansetron (ZOFRAN ODT) 4 MG disintegrating tablet Take 1 tablet (4 mg total) by mouth every 8 (eight) hours as needed. 20 tablet 6  . pantoprazole (PROTONIX) 40 MG tablet Take 1 tablet (40  mg total) by mouth daily. 90 tablet 3  . sucralfate (CARAFATE) 1 GM/10ML suspension Take 10 mLs (1 g total) by mouth 4 (four) times daily -  with meals and at bedtime. 420 mL 0  . SUMAtriptan (IMITREX) 100 MG tablet Take 1 tablet (100 mg total) by mouth once as needed for up to 1 dose for migraine. May repeat in 2 hours if headache persists or recurs. 12 tablet 11  . tiZANidine (ZANAFLEX) 4 MG tablet Take 1 tablet (4 mg total) by mouth every 6 (six) hours as needed. 30 tablet 6  . venlafaxine XR (EFFEXOR XR) 37.5 MG 24 hr capsule Take 1 capsule (37.5 mg total) by mouth daily with breakfast. 30 capsule 6  . Vitamin D, Ergocalciferol, (DRISDOL) 1.25 MG (50000 UNIT) CAPS capsule Take 1 capsule (50,000 Units total) by mouth every 7 (seven) days. 12 capsule 0  . zolpidem (AMBIEN) 5  MG tablet Take 1-2 tablets (5-10 mg total) by mouth at bedtime as needed for sleep. 30 tablet 0   No current facility-administered medications for this visit.    SURGICAL HISTORY:  Past Surgical History:  Procedure Laterality Date  . COLONOSCOPY    . GASTRIC BYPASS    . JOINT REPLACEMENT N/A    [employer] 06/12/2020  . POLYPECTOMY    . TOTAL KNEE ARTHROPLASTY Left 07/30/2019   Procedure: LEFT TOTAL KNEE ARTHROPLASTY;  Surgeon: Leandrew Koyanagi, MD;  Location: [facility];  Service: Orthopedics;  Laterality: Left;    REVIEW OF SYSTEMS:  A comprehensive review of systems was negative except for: Constitutional: positive for fatigue   PHYSICAL EXAMINATION: General appearance: alert, cooperative, fatigued and no distress Head: Normocephalic, without obvious abnormality, atraumatic Neck: no adenopathy, no JVD, supple, symmetrical, trachea midline and thyroid not enlarged, symmetric, no tenderness/mass/nodules Lymph nodes: Cervical, supraclavicular, and axillary nodes normal. Resp: clear to auscultation bilaterally Back: symmetric, no curvature. ROM normal. No CVA tenderness. Cardio: regular rate and rhythm, S1, S2 normal, no murmur, click, rub or gallop GI: soft, non-tender; bowel sounds normal; no masses,  no organomegaly Extremities: extremities normal, atraumatic, no cyanosis or edema  ECOG PERFORMANCE STATUS: 1 - Symptomatic but completely ambulatory  Blood pressure (!) 157/89, pulse 83, temperature (!) 96.5 F (35.8 C), temperature source Tympanic, resp. rate 20, height 5\' 4"  (1.626 m), weight 212 lb (96.2 kg), SpO2 100 %.  LABORATORY DATA: Lab Results  Component Value Date   WBC 2.6 (L) 01/12/2021   HGB 10.9 (L) 01/12/2021   HCT 37.5 01/12/2021   MCV 87.2 01/12/2021   PLT 227 01/12/2021      Chemistry      Component Value Date/Time   NA 142 12/01/2020 1302   NA 140 10/03/2020 1030   K 4.1 12/01/2020 1302   CL 109 12/01/2020 1302   CO2 27 12/01/2020 1302   BUN 9 12/01/2020  1302   BUN 7 10/03/2020 1030   CREATININE 0.80 12/01/2020 1302   CREATININE 1.00 03/01/2016 1053      Component Value Date/Time   CALCIUM 9.5 12/01/2020 1302   ALKPHOS 95 12/01/2020 1302   AST 21 12/01/2020 1302   ALT 15 12/01/2020 1302   BILITOT 0.3 12/01/2020 1302       RADIOGRAPHIC STUDIES: No results found.  ASSESSMENT AND PLAN: This is a very pleasant 60 years old African-American female with history of iron deficiency anemia secondary to malabsorption after bariatric surgery.  The patient was treated with iron infusion with Venofer 300 mg weekly for 3  doses.  She is feeling little bit better but continues to have fatigue. Repeat CBC today showed improvement of her hemoglobin and hematocrit.  The iron study and ferritin are still pending. I recommended for the patient to continue on observation for now with repeat CBC, iron study and ferritin in 3 months. If the pending iron studies showed persistent iron deficiency, I will arrange for the patient to receive iron infusion in the interval. For the hypertension she was advised to take her blood pressure medication as prescribed and to monitor it closely at home. She was advised to call immediately if she has any other concerning symptoms in the interval. The patient voices understanding of current disease status and treatment options and is in agreement with the current care plan.  All questions were answered. The patient knows to call the clinic with any problems, questions or concerns. We can certainly see the patient much sooner if necessary.  The total time spent in the appointment was 20 minutes.  Disclaimer: This note was dictated with voice recognition software. Similar sounding words can inadvertently be transcribed and may not be corrected upon review.

## 2021-01-14 MED FILL — LEVOTHYROXINE SODIUM 125 MC: 125 | 30 days supply | Qty: 30 | Fill #0

## 2021-01-15 MED FILL — VENLAFAXINE HCL ER 37.5 MG: 37.5 | 30 days supply | Qty: 30 | Fill #1

## 2021-01-19 ENCOUNTER — Ambulatory Visit (INDEPENDENT_AMBULATORY_CARE_PROVIDER_SITE_OTHER): Payer: 59 | Admitting: Bariatrics

## 2021-01-23 ENCOUNTER — Ambulatory Visit: Payer: 59 | Admitting: Nurse Practitioner

## 2021-03-06 ENCOUNTER — Other Ambulatory Visit (INDEPENDENT_AMBULATORY_CARE_PROVIDER_SITE_OTHER): Payer: Self-pay | Admitting: Adult Health

## 2021-03-06 ENCOUNTER — Other Ambulatory Visit (HOSPITAL_COMMUNITY): Payer: Self-pay

## 2021-03-06 MED FILL — Atorvastatin Calcium Tab 40 MG (Base Equivalent): ORAL | 90 days supply | Qty: 45 | Fill #0 | Status: CN

## 2021-03-06 MED FILL — Citalopram Hydrobromide Tab 20 MG (Base Equiv): ORAL | 90 days supply | Qty: 135 | Fill #0 | Status: AC

## 2021-03-06 MED FILL — Losartan Potassium Tab 25 MG: ORAL | 90 days supply | Qty: 90 | Fill #0 | Status: AC

## 2021-03-06 MED FILL — Atorvastatin Calcium Tab 40 MG (Base Equivalent): ORAL | 90 days supply | Qty: 45 | Fill #0 | Status: AC

## 2021-03-06 MED FILL — Venlafaxine HCl Cap ER 24HR 37.5 MG (Base Equivalent): ORAL | 30 days supply | Qty: 30 | Fill #0 | Status: AC

## 2021-03-06 MED FILL — Pantoprazole Sodium EC Tab 40 MG (Base Equiv): ORAL | 90 days supply | Qty: 90 | Fill #0 | Status: AC

## 2021-03-06 MED FILL — Tizanidine HCl Tab 4 MG (Base Equivalent): ORAL | 7 days supply | Qty: 30 | Fill #0 | Status: AC

## 2021-03-06 MED FILL — Sumatriptan Succinate Tab 100 MG: ORAL | 30 days supply | Qty: 9 | Fill #0 | Status: AC

## 2021-03-06 MED FILL — Citalopram Hydrobromide Tab 20 MG (Base Equiv): ORAL | 90 days supply | Qty: 135 | Fill #0 | Status: CN

## 2021-03-07 ENCOUNTER — Other Ambulatory Visit (HOSPITAL_COMMUNITY): Payer: Self-pay

## 2021-03-09 ENCOUNTER — Other Ambulatory Visit (HOSPITAL_COMMUNITY): Payer: Self-pay

## 2021-03-09 NOTE — Telephone Encounter (Signed)
Pt last seen by Katy Danford, FNP.  

## 2021-03-18 ENCOUNTER — Other Ambulatory Visit (HOSPITAL_COMMUNITY): Payer: Self-pay

## 2021-03-18 ENCOUNTER — Encounter: Payer: Self-pay | Admitting: Physician Assistant

## 2021-03-18 ENCOUNTER — Encounter: Payer: Self-pay | Admitting: Neurology

## 2021-03-18 ENCOUNTER — Ambulatory Visit (INDEPENDENT_AMBULATORY_CARE_PROVIDER_SITE_OTHER): Payer: 59 | Admitting: Neurology

## 2021-03-18 VITALS — BP 149/100 | HR 98 | Ht 64.0 in | Wt 212.0 lb

## 2021-03-18 DIAGNOSIS — G43709 Chronic migraine without aura, not intractable, without status migrainosus: Secondary | ICD-10-CM

## 2021-03-18 MED ORDER — ONDANSETRON 4 MG PO TBDP
4.0000 mg | ORAL_TABLET | Freq: Three times a day (TID) | ORAL | 3 refills | Status: DC | PRN
  Filled 2021-03-18: qty 20, 7d supply, fill #0

## 2021-03-18 MED ORDER — TOPIRAMATE 25 MG PO TABS
ORAL_TABLET | ORAL | 6 refills | Status: DC
  Filled 2021-03-18: qty 90, 30d supply, fill #0
  Filled 2021-07-23 – 2021-08-03 (×2): qty 90, 30d supply, fill #1

## 2021-03-18 MED ORDER — NURTEC 75 MG PO TBDP
75.0000 mg | ORAL_TABLET | ORAL | 11 refills | Status: DC | PRN
  Filled 2021-03-18 – 2021-03-19 (×2): qty 8, 30d supply, fill #0
  Filled 2022-03-09 (×2): qty 8, 30d supply, fill #1

## 2021-03-18 NOTE — Progress Notes (Signed)
HISTORICAL  Julie Miranda is a 60 year old female, seen in request by her primary care physician nurse practitioner [name], for evaluation of chronic migraine, initial evaluation was April 30, 2020.  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 She had a history of hypothyroidism, apparently noncompliant with her medications, multiple elevated TSH level, "sometimes I run out of the medication"  She reported lifelong history of migraine headaches, lateralized severe pounding headache with associated light noise sensitivity, nauseous, is usually stay on the right side, occasionally on the left side,  Over the years, she reported gradual worsening migraine headaches, half time of the month, she has to deal with different degree of migraine headaches, she tends to take 2 tablets of Excedrin Migraine, twice a day to help her migraine," sometimes I sleep with migraine, wake up with migraine  She was giving nortriptyline 25 mg as preventive medication, which did help her her sleep better, but she ran out of the medicine, has stopped taking it, not sure about the benefit for migraine  Laboratory evaluation showed normal CBC, CMP, TSH, was significantly elevated at 16.7,  UPDATE Dec 16 2020: She is accompanied by her mother Berenice Primas at today's clinical visit, she complains of worsening stress, depression anxiety, headaches, is taking Celexa 20 mg daily, however never tried take nortriptyline regularly, she described nortriptyline did help her go to sleep, but feeling tired after waking up,  She was recently defined to have significant iron deficiency anemia, ferritin level was only 4, hemoglobin of 8.1, receiving iron infusion, which has helped her symptoms, is going through GI evaluation for potential GI loss.  She complains of excessive fatigue, frequent bilateral frontal moderate to severe headaches, with associated light noise  sensitivity lasting hours, tried Maxalt with limited help,   Normal vitamin B12 498, vitamin D 27.1, CMP, creatinine 0.8, CBC, decreased WBC 3.5, hemoglobin of 8.1, elevated platelet count of 498, elevated RDW of 19.9  Update Mar 18, 2021 SS: Taking Effexor XR 37.5 mg once daily, claims no change in headaches, having more anxiety, more stress on her at work as a Psychologist, counselling. Headaches are daily, 2 days a week are really bad. Takes Excedrin migraine at work, takes twice daily, every other day. Imitrex with tizanidine, Zofran, it relaxes her. So hyped up from anxiety, sometimes can't differentiate if medications help.   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 Feeling better, felt fatigued, dizzy before.   REVIEW OF SYSTEMS: Full 14 system review of systems performed and notable only for as above  See HPI  ALLERGIES: Allergies  Allergen Reactions  . Morphine And Related Nausea And Vomiting    HOME MEDICATIONS: Current Outpatient Medications  Medication Sig Dispense Refill  . albuterol (VENTOLIN HFA) 108 (90 Base) MCG/ACT inhaler INHALE 2 PUFFS INTO THE LUNGS EVERY 6 (SIX) HOURS AS NEEDED FOR WHEEZING OR SHORTNESS OF BREATH. 8.5 g 0  . atorvastatin (LIPITOR) 40 MG tablet TAKE 1 TABLET (40 MG TOTAL) BY MOUTH EVERY OTHER DAY. 90 tablet 1  . busPIRone (BUSPAR) 10 MG tablet TAKE 1 TABLET (10 MG TOTAL) BY MOUTH 2 (TWO) TIMES DAILY. 180 tablet 0  . citalopram (CELEXA) 20 MG tablet TAKE 1.5 TABLETS (30 MG TOTAL) BY MOUTH DAILY. 145 tablet 1  . cyclobenzaprine (FLEXERIL) 10 MG tablet Take 10 mg by mouth at bedtime.    Marland Kitchen  dicyclomine (BENTYL) 20 MG tablet TAKE 1 TABLET (20 MG TOTAL) BY MOUTH 3 (THREE) TIMES DAILY BEFORE MEALS. TAKE ONLY AS NEEDED FOR ABDOMINAL PAIN. 60 tablet 0  . gabapentin (NEURONTIN) 100 MG capsule Take 300 mg by mouth every other day.    . levothyroxine  (SYNTHROID) 125 MCG tablet TAKE 1 TABLET (125 MCG TOTAL) BY MOUTH DAILY. 30 tablet 0  . losartan (COZAAR) 25 MG tablet TAKE 1 TABLET (25 MG TOTAL) BY MOUTH DAILY. 90 tablet 1  . lubiprostone (AMITIZA) 24 MCG capsule TAKE 1 CAPSULE (24 MCG TOTAL) BY MOUTH 2 (TWO) TIMES DAILY WITH A MEAL. 60 capsule 3  . methocarbamol (ROBAXIN) 500 MG tablet Take 250 mg by mouth every 8 (eight) hours as needed for muscle spasms.    . pantoprazole (PROTONIX) 40 MG tablet TAKE 1 TABLET (40 MG TOTAL) BY MOUTH DAILY. 90 tablet 3  . Rimegepant Sulfate (NURTEC) 75 MG TBDP Take 75 mg by mouth as needed (take 1 at onset of headache, max is 1 tablet in 24 hours). 12 tablet 11  . sucralfate (CARAFATE) 1 GM/10ML suspension TAKE 10 MLS BY MOUTH FOUR TIMES DAILY WITH MEALS AND AT BEDTIME. 420 mL 0  . SUMAtriptan (IMITREX) 100 MG tablet TAKE 1 TABLET (100 MG TOTAL) BY MOUTH ONCE AS NEEDED FOR UP TO 1 DOSE FOR MIGRAINE. MAY REPEAT IN 2 HOURS IF HEADACHE PERSISTS OR RECURS. 12 tablet 11  . tiZANidine (ZANAFLEX) 4 MG tablet TAKE 1 TABLET (4 MG TOTAL) BY MOUTH EVERY 6 (SIX) HOURS AS NEEDED. 30 tablet 6  . topiramate (TOPAMAX) 25 MG tablet Take 1 tablet by mouth for 3 days at bedtime, then take 2 tablets for 3 days, then take 3 tablets at bedtime. 90 tablet 6  . Vitamin D, Ergocalciferol, (DRISDOL) 1.25 MG (50000 UNIT) CAPS capsule TAKE 1 CAPSULE (50,000 UNITS TOTAL) BY MOUTH EVERY 7 (SEVEN) DAYS. 12 capsule 0  . zolpidem (AMBIEN) 5 MG tablet TAKE 1-2 TABLETS (5-10 MG TOTAL) BY MOUTH AT BEDTIME AS NEEDED FOR SLEEP. 30 tablet 0  . ondansetron (ZOFRAN-ODT) 4 MG disintegrating tablet Take 1 tablet (4 mg total) by mouth every 8 (eight) hours as needed. 20 tablet 3   No current facility-administered medications for this visit.    PAST MEDICAL HISTORY: Past Medical History:  Diagnosis Date  . Allergy   . Anemia   . Anxiety   . Arthritis   . Back pain   . Chest pain   . Colon polyps    x 3  . Depression   . Edema of both lower  extremities   . Family history of adverse reaction to anesthesia    makes mother" loopy"  . Gastric ulcer   . GERD (gastroesophageal reflux disease)   . Graves disease   . Headache   . History of pancytopenia    of unknown etiology   . Hyperlipidemia   . Hypertension   . Hypothyroidism   . IBS (irritable bowel syndrome)   . Joint pain   . Migraine   . Osteoarthritis   . Thyroid disease   . Vitamin D deficiency     PAST SURGICAL HISTORY: Past Surgical History:  Procedure Laterality Date  . COLONOSCOPY    . GASTRIC BYPASS    . JOINT REPLACEMENT N/A    Phreesia 06/12/2020  . POLYPECTOMY    . TOTAL KNEE ARTHROPLASTY Left 07/30/2019   Procedure: LEFT TOTAL KNEE ARTHROPLASTY;  Surgeon: Leandrew Koyanagi, MD;  Location: [facility];  Service: Orthopedics;  Laterality: Left;    FAMILY HISTORY: Family History  Problem Relation Age of Onset  . Hypertension Mother   . Gout Mother   . Diabetes Mother   . High Cholesterol Mother   . Anxiety disorder Mother   . Obesity Mother   . Diabetes Maternal Aunt        x 2  . Heart disease Sister   . Pancreatic cancer Sister   . Liver cancer Maternal Aunt   . Stroke Father   . Colon cancer Neg Hx   . Kidney disease Neg Hx   . Colon polyps Neg Hx   . Esophageal cancer Neg Hx   . Rectal cancer Neg Hx   . Stomach cancer Neg Hx     SOCIAL HISTORY: Social History   Socioeconomic History  . Marital status: Single    Spouse name: Not on file  . Number of children: 0  . Years of education: some college  . Highest education level: Not on file  Occupational History  . Occupation: Chartered certified accountant  Tobacco Use  . Smoking status: Never Smoker  . Smokeless tobacco: Never Used  Vaping Use  . Vaping Use: Never used  Substance and Sexual Activity  . Alcohol use: Yes    Comment: occasional  . Drug use: No  . Sexual activity: Not Currently    Birth control/protection: Injection  Other Topics Concern  . Not on file  Social History Narrative    Lives at home with her mother.   Right-handed.   One cup caffeine per day.   Social Determinants of Health   Financial Resource Strain: Not on file  Food Insecurity: Not on file  Transportation Needs: Not on file  Physical Activity: Not on file  Stress: Not on file  Social Connections: Not on file  Intimate Partner Violence: Not on file   PHYSICAL EXAM   Vitals:   03/18/21 1315  BP: (!) 149/100  Pulse: 98  Weight: 212 lb (96.2 kg)  Height: 5\' 4"  (1.626 m)   Not recorded    Body mass index is 36.39 kg/m.  Physical Exam  General: The patient is alert and cooperative at the time of the examination, anxious appearing, fidgety.  Skin: No significant peripheral edema is noted.  Neurologic Exam  Mental status: The patient is alert and oriented x 3 at the time of the examination. The patient has apparent normal recent and remote memory, with an apparently normal attention span and concentration ability.  Cranial nerves: Facial symmetry is present. Speech is normal, no aphasia or dysarthria is noted. Extraocular movements are full. Visual fields are full.  Motor: The patient has good strength in all 4 extremities.  Sensory examination: Soft touch sensation is symmetric on the face, arms, and legs.  Coordination: The patient has good finger-nose-finger and heel-to-shin bilaterally.  Gait and station: Gait is wide-based, mildly antalgic  Reflexes: Deep tendon reflexes are symmetric.  DIAGNOSTIC DATA (LABS, IMAGING, TESTING) - I reviewed patient records, labs, notes, testing and imaging myself where available.   ASSESSMENT AND PLAN  KENNI NEWTON is a 60 y.o. female   1. Migraine headaches -Claims side effect of nortriptyline, Effexor has not been helpful -Anxiety is certainly apparent, along with frequent Excedrin Migraine use contributing to potential rebound headaches -Will try Topamax working up to 75 mg at bedtime for migraine prevention, didn't find any  evidence of contraindication with anemia history -Will stop Effexor, also on Celexa, doubtful higher dose  of Effexor would make much difference -Try Nurtec for acute headache, can keep Imitrex as secondary option, encouraged to stop Excedrin Migraine use, can use Zofran for nausea with headache; Maxalt was not helpful -Ultimately, likely a good candidate for CGRP, if cannot tolerate Topamax -Call for dose adjustment, follow-up in 6 months or sooner if needed  2. Iron deficiency anemia of unknown cause, -Has seen hematology, finished IV iron infusions, now on oral iron, is feeling better   I spent 33 minutes of face-to-face and non-face-to-face time with patient.  This included previsit chart review, lab review, discussing medications for migraine prevention and relief, management plan going forward.   Evangeline Dakin, DNP  [facility] [address] [place], [place] [address] [phone]

## 2021-03-18 NOTE — Patient Instructions (Addendum)
Stop the Effexor since not helping, take every other day for 1 week then stop  Start taking Topamax 25 mg at bedtime x 3 days, then take 50 mg at bedtime x 3 days, then take 75 mg at bedtime, stay on this dose for migraine prevention  Try Nurtec for acute headache, 1 tablet at onset of headache, max is 1 tablet in 24 hours Can continue Imitrex as alternative Stop daily excedrin migraine use  See you back in 6 months, call for dose adjustment

## 2021-03-19 ENCOUNTER — Telehealth: Payer: Self-pay | Admitting: *Deleted

## 2021-03-19 ENCOUNTER — Encounter: Payer: Self-pay | Admitting: *Deleted

## 2021-03-19 ENCOUNTER — Other Ambulatory Visit (HOSPITAL_COMMUNITY): Payer: Self-pay

## 2021-03-19 NOTE — Telephone Encounter (Signed)
Nurtec PA, key: [id number], [id number], side effects to nortriptyline, Effexor not effective. Your information has been sent to [facility].

## 2021-03-19 NOTE — Telephone Encounter (Signed)
Nurtec: The authorization is effective for a maximum of 6 fill(s) from 03/19/2021 to 09/18/2021. Sent my chart to inform patient. Faxed approval to pharmacy.

## 2021-03-24 ENCOUNTER — Other Ambulatory Visit (HOSPITAL_COMMUNITY): Payer: Self-pay

## 2021-04-06 ENCOUNTER — Other Ambulatory Visit (HOSPITAL_COMMUNITY): Payer: Self-pay

## 2021-04-06 ENCOUNTER — Encounter: Payer: Self-pay | Admitting: Registered Nurse

## 2021-04-06 ENCOUNTER — Other Ambulatory Visit: Payer: Self-pay

## 2021-04-06 ENCOUNTER — Other Ambulatory Visit: Payer: 59

## 2021-04-06 ENCOUNTER — Ambulatory Visit: Payer: 59 | Admitting: Registered Nurse

## 2021-04-06 VITALS — BP 114/74 | HR 94 | Temp 97.6°F | Resp 18 | Ht 64.0 in | Wt 210.6 lb

## 2021-04-06 DIAGNOSIS — R0600 Dyspnea, unspecified: Secondary | ICD-10-CM

## 2021-04-06 DIAGNOSIS — E559 Vitamin D deficiency, unspecified: Secondary | ICD-10-CM

## 2021-04-06 DIAGNOSIS — R509 Fever, unspecified: Secondary | ICD-10-CM

## 2021-04-06 DIAGNOSIS — R42 Dizziness and giddiness: Secondary | ICD-10-CM | POA: Diagnosis not present

## 2021-04-06 DIAGNOSIS — D509 Iron deficiency anemia, unspecified: Secondary | ICD-10-CM | POA: Diagnosis not present

## 2021-04-06 DIAGNOSIS — E039 Hypothyroidism, unspecified: Secondary | ICD-10-CM

## 2021-04-06 DIAGNOSIS — R9431 Abnormal electrocardiogram [ECG] [EKG]: Secondary | ICD-10-CM

## 2021-04-06 DIAGNOSIS — R110 Nausea: Secondary | ICD-10-CM

## 2021-04-06 DIAGNOSIS — R0609 Other forms of dyspnea: Secondary | ICD-10-CM

## 2021-04-06 LAB — CBC WITH DIFFERENTIAL/PLATELET
Basophils Absolute: 0 10*3/uL (ref 0.0–0.1)
Basophils Relative: 1.2 % (ref 0.0–3.0)
Eosinophils Absolute: 0.1 10*3/uL (ref 0.0–0.7)
Eosinophils Relative: 3 % (ref 0.0–5.0)
HCT: 39.5 % (ref 36.0–46.0)
Hemoglobin: 12.7 g/dL (ref 12.0–15.0)
Lymphocytes Relative: 32.6 % (ref 12.0–46.0)
Lymphs Abs: 1 10*3/uL (ref 0.7–4.0)
MCHC: 32.2 g/dL (ref 30.0–36.0)
MCV: 89.5 fl (ref 78.0–100.0)
Monocytes Absolute: 0.2 10*3/uL (ref 0.1–1.0)
Monocytes Relative: 8 % (ref 3.0–12.0)
Neutro Abs: 1.7 10*3/uL (ref 1.4–7.7)
Neutrophils Relative %: 55.2 % (ref 43.0–77.0)
Platelets: 202 10*3/uL (ref 150.0–400.0)
RBC: 4.42 Mil/uL (ref 3.87–5.11)
RDW: 17.1 % — ABNORMAL HIGH (ref 11.5–15.5)
WBC: 3 10*3/uL — ABNORMAL LOW (ref 4.0–10.5)

## 2021-04-06 LAB — HEMOGLOBIN A1C: Hgb A1c MFr Bld: 5.7 % (ref 4.6–6.5)

## 2021-04-06 LAB — VITAMIN D 25 HYDROXY (VIT D DEFICIENCY, FRACTURES): VITD: 27.73 ng/mL — ABNORMAL LOW (ref 30.00–100.00)

## 2021-04-06 MED ORDER — ONDANSETRON HCL 4 MG PO TABS
4.0000 mg | ORAL_TABLET | Freq: Three times a day (TID) | ORAL | 0 refills | Status: DC | PRN
  Filled 2021-04-06: qty 20, 7d supply, fill #0

## 2021-04-06 NOTE — Progress Notes (Signed)
Established Patient Office Visit  Subjective:  Patient ID: Julie Miranda, female    DOB: 09-06-1961  Age: 60 y.o. MRN: [id number]  CC:  Chief Complaint  Patient presents with   Nausea    Patient states she has been having some diarrhea, nauseas, headches, dizziness, fatigue and sweating a lot lately for a couple weeks . Patient states she does not know if its her vitamin D    HPI Julie Miranda presents for nausea, diarrhea, headaches, dizziness  Onset a few weeks ago, slow start More and more diarrhea, headaches, nausea Has been sleeping a lot more due to fatigue Dizziness at work - concern for pt safety  BP has been wnl, afebrile but feels feverish No vomiting Symptoms worse when physically active More easily shob, but no wheezing or coughing No dependent edema or claudication  Medical history includes anemia, obesity, hypothyroidism, anemia, chronic headache, GERD, and chronic pain  Does note that she has started topamax. Still taking 25mg  dose at bedtime.  Labs from within last 3 mo suggest adequate control of anemia and thyroid function. Feels that her clothes are fitting better suggesting to her that she's losing weight. Headaches under control of neurology - last visit in late May - Ms. Slack PA stopped effexor, stopped nortriptyline, started topamax at bedtime with sumatriptan and nurtec as PRNs, suggest CGRP as next step Past Medical History:  Diagnosis Date   Allergy    Anemia    Anxiety    Arthritis    Back pain    Chest pain    Colon polyps    x 3   Depression    Edema of both lower extremities    Family history of adverse reaction to anesthesia    makes mother" loopy"   Gastric ulcer    GERD (gastroesophageal reflux disease)    Graves disease    Headache    History of pancytopenia    of unknown etiology    Hyperlipidemia    Hypertension    Hypothyroidism    IBS (irritable bowel syndrome)    Joint pain    Migraine    Osteoarthritis     Thyroid disease    Vitamin D deficiency     Past Surgical History:  Procedure Laterality Date   COLONOSCOPY     GASTRIC BYPASS     JOINT REPLACEMENT N/A    Phreesia 06/12/2020   POLYPECTOMY     TOTAL KNEE ARTHROPLASTY Left 07/30/2019   Procedure: LEFT TOTAL KNEE ARTHROPLASTY;  Surgeon: Leandrew Koyanagi, MD;  Location: [facility];  Service: Orthopedics;  Laterality: Left;    Family History  Problem Relation Age of Onset   Hypertension Mother    Gout Mother    Diabetes Mother    High Cholesterol Mother    Anxiety disorder Mother    Obesity Mother    Diabetes Maternal Aunt        x 2   Heart disease Sister    Pancreatic cancer Sister    Liver cancer Maternal Aunt    Stroke Father    Colon cancer Neg Hx    Kidney disease Neg Hx    Colon polyps Neg Hx    Esophageal cancer Neg Hx    Rectal cancer Neg Hx    Stomach cancer Neg Hx     Social History   Socioeconomic History   Marital status: Single    Spouse name: Not on file   Number of children: 0  Years of education: some college   Highest education level: Not on file  Occupational History   Occupation: Nurse Tech  Tobacco Use   Smoking status: Never   Smokeless tobacco: Never  Vaping Use   Vaping Use: Never used  Substance and Sexual Activity   Alcohol use: Yes    Comment: occasional   Drug use: No   Sexual activity: Not Currently    Birth control/protection: Injection  Other Topics Concern   Not on file  Social History Narrative   Lives at home with her mother.   Right-handed.   One cup caffeine per day.   Social Determinants of Health   Financial Resource Strain: Not on file  Food Insecurity: Not on file  Transportation Needs: Not on file  Physical Activity: Not on file  Stress: Not on file  Social Connections: Not on file  Intimate Partner Violence: Not on file    Outpatient Medications Prior to Visit  Medication Sig Dispense Refill   albuterol (VENTOLIN HFA) 108 (90 Base) MCG/ACT inhaler INHALE 2  PUFFS INTO THE LUNGS EVERY 6 (SIX) HOURS AS NEEDED FOR WHEEZING OR SHORTNESS OF BREATH. 8.5 g 0   atorvastatin (LIPITOR) 40 MG tablet TAKE 1 TABLET (40 MG TOTAL) BY MOUTH EVERY OTHER DAY. 90 tablet 1   busPIRone (BUSPAR) 10 MG tablet TAKE 1 TABLET (10 MG TOTAL) BY MOUTH 2 (TWO) TIMES DAILY. 180 tablet 0   citalopram (CELEXA) 20 MG tablet TAKE 1.5 TABLETS (30 MG TOTAL) BY MOUTH DAILY. 145 tablet 1   cyclobenzaprine (FLEXERIL) 10 MG tablet Take 10 mg by mouth at bedtime.     dicyclomine (BENTYL) 20 MG tablet TAKE 1 TABLET (20 MG TOTAL) BY MOUTH 3 (THREE) TIMES DAILY BEFORE MEALS. TAKE ONLY AS NEEDED FOR ABDOMINAL PAIN. 60 tablet 0   gabapentin (NEURONTIN) 100 MG capsule Take 300 mg by mouth every other day.     levothyroxine (SYNTHROID) 125 MCG tablet TAKE 1 TABLET (125 MCG TOTAL) BY MOUTH DAILY. 30 tablet 0   losartan (COZAAR) 25 MG tablet TAKE 1 TABLET (25 MG TOTAL) BY MOUTH DAILY. 90 tablet 1   lubiprostone (AMITIZA) 24 MCG capsule TAKE 1 CAPSULE (24 MCG TOTAL) BY MOUTH 2 (TWO) TIMES DAILY WITH A MEAL. 60 capsule 3   methocarbamol (ROBAXIN) 500 MG tablet Take 250 mg by mouth every 8 (eight) hours as needed for muscle spasms.     ondansetron (ZOFRAN-ODT) 4 MG disintegrating tablet Take 1 tablet (4 mg total) by mouth every 8 (eight) hours as needed. 20 tablet 3   pantoprazole (PROTONIX) 40 MG tablet TAKE 1 TABLET (40 MG TOTAL) BY MOUTH DAILY. 90 tablet 3   Rimegepant Sulfate (NURTEC) 75 MG TBDP Take 75 mg by mouth as needed (take 1 at onset of headache, max is 1 tablet in 24 hours). 12 tablet 11   sucralfate (CARAFATE) 1 GM/10ML suspension TAKE 10 MLS BY MOUTH FOUR TIMES DAILY WITH MEALS AND AT BEDTIME. 420 mL 0   SUMAtriptan (IMITREX) 100 MG tablet TAKE 1 TABLET (100 MG TOTAL) BY MOUTH ONCE AS NEEDED FOR UP TO 1 DOSE FOR MIGRAINE. MAY REPEAT IN 2 HOURS IF HEADACHE PERSISTS OR RECURS. 12 tablet 11   tiZANidine (ZANAFLEX) 4 MG tablet TAKE 1 TABLET (4 MG TOTAL) BY MOUTH EVERY 6 (SIX) HOURS AS NEEDED. 30  tablet 6   topiramate (TOPAMAX) 25 MG tablet Take 1 tablet by mouth for 3 days at bedtime, then take 2 tablets for 3 days, then take  3 tablets at bedtime. 90 tablet 6   Vitamin D, Ergocalciferol, (DRISDOL) 1.25 MG (50000 UNIT) CAPS capsule TAKE 1 CAPSULE (50,000 UNITS TOTAL) BY MOUTH EVERY 7 (SEVEN) DAYS. 12 capsule 0   zolpidem (AMBIEN) 5 MG tablet TAKE 1-2 TABLETS (5-10 MG TOTAL) BY MOUTH AT BEDTIME AS NEEDED FOR SLEEP. 30 tablet 0   No facility-administered medications prior to visit.    Allergies  Allergen Reactions   Morphine And Related Nausea And Vomiting    ROS Review of Systems  Constitutional: Negative.   HENT: Negative.    Eyes: Negative.   Respiratory: Negative.    Cardiovascular: Negative.   Gastrointestinal: Negative.   Endocrine: Negative.   Genitourinary: Negative.   Musculoskeletal: Negative.   Skin: Negative.   Allergic/Immunologic: Negative.   Neurological: Negative.   Hematological: Negative.   Psychiatric/Behavioral: Negative.       Objective:    Physical Exam Vitals and nursing note reviewed.  Constitutional:      General: She is not in acute distress.    Appearance: Normal appearance. She is normal weight. She is not ill-appearing, toxic-appearing or diaphoretic.  Cardiovascular:     Rate and Rhythm: Normal rate and regular rhythm.     Heart sounds: Normal heart sounds. No murmur heard.   No friction rub. No gallop.  Pulmonary:     Effort: Pulmonary effort is normal. No respiratory distress.     Breath sounds: Normal breath sounds. No stridor. No wheezing, rhonchi or rales.  Chest:     Chest wall: No tenderness.  Skin:    General: Skin is warm and dry.  Neurological:     General: No focal deficit present.     Mental Status: She is alert and oriented to person, place, and time. Mental status is at baseline.  Psychiatric:        Mood and Affect: Mood normal.        Behavior: Behavior normal.        Thought Content: Thought content normal.         Judgment: Judgment normal.    BP 114/74   Pulse 94   Temp 97.6 F (36.4 C) (Temporal)   Resp 18   Ht 5\' 4"  (1.626 m)   Wt 210 lb 9.6 oz (95.5 kg)   LMP  (LMP Unknown) Comment: pt. states that it has been over 1 year  SpO2 99%   BMI 36.15 kg/m  Wt Readings from Last 3 Encounters:  04/06/21 210 lb 9.6 oz (95.5 kg)  03/18/21 212 lb (96.2 kg)  01/12/21 212 lb (96.2 kg)     Health Maintenance Due  Topic Date Due   Zoster Vaccines- Shingrix (1 of 2) Never done    There are no preventive care reminders to display for this patient.  Lab Results  Component Value Date   TSH 0.963 12/29/2020   Lab Results  Component Value Date   WBC 2.6 (L) 01/12/2021   HGB 10.9 (L) 01/12/2021   HCT 37.5 01/12/2021   MCV 87.2 01/12/2021   PLT 227 01/12/2021   Lab Results  Component Value Date   NA 142 12/01/2020   K 4.1 12/01/2020   CO2 27 12/01/2020   GLUCOSE 93 12/01/2020   BUN 9 12/01/2020   CREATININE 0.80 12/01/2020   BILITOT 0.3 12/01/2020   ALKPHOS 95 12/01/2020   AST 21 12/01/2020   ALT 15 12/01/2020   PROT 7.2 12/01/2020   ALBUMIN 3.9 12/01/2020   CALCIUM 9.5 12/01/2020  ANIONGAP 6 12/01/2020   Lab Results  Component Value Date   CHOL 216 (H) 07/02/2020   Lab Results  Component Value Date   HDL 79 07/02/2020   Lab Results  Component Value Date   LDLCALC 120 (H) 07/02/2020   Lab Results  Component Value Date   TRIG 100 07/02/2020   Lab Results  Component Value Date   CHOLHDL 3.1 04/02/2020   Lab Results  Component Value Date   HGBA1C 5.5 07/02/2020      Assessment & Plan:   Problem List Items Addressed This Visit   None   No orders of the defined types were placed in this encounter.   Follow-up: No follow-ups on file.   PLAN Unclear etiology.  EKG compared to 10/03/20 - no acute changes. Machine readout suggest RA enlargement but unsure of this - a lot of artifact present. Will refer to cardiology. Has history of substantial anemia,  hypertensive at times, obesity, and with new DOE have some concern. Increase celexa as her anxiety is either causative, contributory, or worsened by symptoms. Zofran for nausea Reinforced adequate PO intake of food and water. Patient encouraged to call clinic with any questions, comments, or concerns. [name], NP

## 2021-04-06 NOTE — Patient Instructions (Addendum)
Ms. Julie Miranda to see you, sorry you're not feeling well  EKG fairly reassuring but I want to have cardiology take a look. I'll have them call you  Collecting a fairly wide set of labs today - I'll contact you with results and next steps  Sent a refill of zofran for symptom relief.  I think increasing celexa to 40mg  daily may help. Take 2 tablets daily, let me know when you're close to running out  Thank you   Rich     If you have lab work done today you will be contacted with your lab results within the next 2 weeks.  If you have not heard from [place] then please contact us. The fastest way to get your results is to register for My Chart.   IF you received an x-ray today, you will receive an invoice from [facility]. Please contact [facility] at [phone] with questions or concerns regarding your invoice.   IF you received labwork today, you will receive an invoice from [facility]. Please contact [facility] at [phone] with questions or concerns regarding your invoice.   Our billing staff will not be able to assist you with questions regarding bills from these companies.  You will be contacted with the lab results as soon as they are available. The fastest way to get your results is to activate your My Chart account. Instructions are located on the last page of this paperwork. If you have not heard from [place] regarding the results in 2 weeks, please contact this office.

## 2021-04-07 LAB — BRAIN NATRIURETIC PEPTIDE: Pro B Natriuretic peptide (BNP): 26 pg/mL (ref 0.0–100.0)

## 2021-04-07 NOTE — Addendum Note (Signed)
Addended by: Eddie North C on: 04/07/2021 10:15 AM   Modules accepted: Orders

## 2021-04-07 NOTE — Addendum Note (Signed)
Addended by: Veneda Melter on: 04/07/2021 10:14 AM   Modules accepted: Orders

## 2021-04-09 ENCOUNTER — Other Ambulatory Visit (HOSPITAL_COMMUNITY): Payer: Self-pay

## 2021-04-09 ENCOUNTER — Encounter: Payer: Self-pay | Admitting: Registered Nurse

## 2021-04-09 NOTE — Telephone Encounter (Signed)
Patient states that you were supposed to call in her Celexa after her appointment, but she that the pharmacy has not received it.

## 2021-04-10 ENCOUNTER — Other Ambulatory Visit (HOSPITAL_COMMUNITY): Payer: Self-pay

## 2021-04-13 ENCOUNTER — Telehealth: Payer: Self-pay | Admitting: Internal Medicine

## 2021-04-13 LAB — IRON,TIBC AND FERRITIN PANEL
%SAT: 34 % (calc) (ref 16–45)
Ferritin: 12 ng/mL — ABNORMAL LOW (ref 16–232)
Iron: 124 ug/dL (ref 45–160)
TIBC: 360 mcg/dL (calc) (ref 250–450)

## 2021-04-13 LAB — THYROID PANEL WITH TSH
Free Thyroxine Index: 2.7 (ref 1.4–3.8)
T3 Uptake: 35 % (ref 22–35)
T4, Total: 7.6 ug/dL (ref 5.1–11.9)
TSH: 0.41 mIU/L (ref 0.40–4.50)

## 2021-04-13 LAB — QUANTIFERON-TB GOLD PLUS
Mitogen-NIL: >10 IU/mL
NIL: 0.03 IU/mL
QuantiFERON-TB Gold Plus: NEGATIVE
TB1-NIL: 0 IU/mL
TB2-NIL: <0 IU/mL

## 2021-04-13 LAB — INSULIN, RANDOM: Insulin: 4.6 u[IU]/mL

## 2021-04-13 LAB — C-PEPTIDE: C-Peptide: 1.83 ng/mL (ref 0.80–3.85)

## 2021-04-13 LAB — PATHOLOGIST SMEAR REVIEW

## 2021-04-13 NOTE — Telephone Encounter (Signed)
Cancelled appts per 6/17 sch msg. Called pt, no answer. Left msg for pt to call back to r/s.

## 2021-04-14 ENCOUNTER — Ambulatory Visit: Payer: 59 | Admitting: Internal Medicine

## 2021-04-14 ENCOUNTER — Other Ambulatory Visit: Payer: 59

## 2021-04-15 ENCOUNTER — Encounter: Payer: Self-pay | Admitting: Cardiology

## 2021-04-15 ENCOUNTER — Telehealth: Payer: Self-pay

## 2021-04-15 NOTE — Progress Notes (Signed)
Cardiology Office Note   Date:  04/16/2021   ID:  Julie Miranda, DOB 01/04/1961, MRN [id number]  PCP:  [name], NP  Cardiologist:   None Referring:  [name], NP  Chief Complaint  Patient presents with   Chest Pain       History of Present Illness: Julie Miranda is a 60 y.o. female who presents for evaluation of an abnormal EKG.  She was referred by [name], NP   .  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  She has had no past cardiac history other than a stress test years ago when she lived in [place].  She has been having chest discomfort.  It is sporadic and sharp.  It is in her mid chest.  Sometimes when it happens it is 10 out of 10 in intensity.  It might last for 10 minutes.  If she is home she takes her BuSpar.  At work she has had to stop and rest.  She has had her blood pressure checked and has not been elevated.  She had to be sent home from work recently because she was having this as well as episodes of dizziness and sweating.  Around Thanksgiving she started having shortness of breath walking moderate distance on level ground.  She will get short of breath walking at work.  She does not necessarily bring her chest discomfort with this.  She is not describing PND or orthopnea.  She does occasionally get palpitations but these are not particularly sustained and she is never recorded these.  She has not had any presyncope or syncope.  She is thought to have some anxiety associated with all of this or as a cause.   Past Medical History:  Diagnosis Date   Anemia    Anxiety    Arthritis    Colon polyps    x 3   Depression    Edema of both lower extremities    Family history of adverse reaction to anesthesia    makes mother" loopy"   Gastric ulcer    GERD (gastroesophageal reflux disease)    Graves disease    History of pancytopenia    of unknown etiology    Hyperlipidemia    Hypertension    Hypothyroidism    IBS (irritable bowel  syndrome)    Migraine    Osteoarthritis    Thyroid disease    Vitamin D deficiency     Past Surgical History:  Procedure Laterality Date   COLONOSCOPY     GASTRIC BYPASS     JOINT REPLACEMENT N/A    [employer] 06/12/2020   POLYPECTOMY     TOTAL KNEE ARTHROPLASTY Left 07/30/2019   Procedure: LEFT TOTAL KNEE ARTHROPLASTY;  Surgeon: Leandrew Koyanagi, MD;  Location: [facility];  Service: Orthopedics;  Laterality: Left;     Current Outpatient Medications  Medication Sig Dispense Refill   albuterol (VENTOLIN HFA) 108 (90 Base) MCG/ACT inhaler INHALE 2 PUFFS INTO THE LUNGS EVERY 6 (SIX) HOURS AS NEEDED FOR WHEEZING OR SHORTNESS OF BREATH. 8.5 g 0   atorvastatin (LIPITOR) 40 MG tablet TAKE 1 TABLET (40 MG TOTAL) BY MOUTH EVERY OTHER DAY. 90 tablet 1   busPIRone (BUSPAR) 10 MG tablet TAKE 1 TABLET (10 MG TOTAL) BY MOUTH 2 (TWO) TIMES DAILY. 180 tablet 0   citalopram (CELEXA) 20 MG tablet TAKE 1.5 TABLETS (30 MG TOTAL) BY MOUTH DAILY. 145 tablet 1   cyclobenzaprine (FLEXERIL) 10  MG tablet Take 10 mg by mouth at bedtime.     dicyclomine (BENTYL) 20 MG tablet TAKE 1 TABLET (20 MG TOTAL) BY MOUTH 3 (THREE) TIMES DAILY BEFORE MEALS. TAKE ONLY AS NEEDED FOR ABDOMINAL PAIN. 60 tablet 0   gabapentin (NEURONTIN) 100 MG capsule Take 300 mg by mouth every other day.     levothyroxine (SYNTHROID) 125 MCG tablet TAKE 1 TABLET (125 MCG TOTAL) BY MOUTH DAILY. 30 tablet 0   losartan (COZAAR) 25 MG tablet TAKE 1 TABLET (25 MG TOTAL) BY MOUTH DAILY. 90 tablet 1   lubiprostone (AMITIZA) 24 MCG capsule TAKE 1 CAPSULE (24 MCG TOTAL) BY MOUTH 2 (TWO) TIMES DAILY WITH A MEAL. 60 capsule 3   methocarbamol (ROBAXIN) 500 MG tablet Take 250 mg by mouth every 8 (eight) hours as needed for muscle spasms.     ondansetron (ZOFRAN) 4 MG tablet Take 1 tablet (4 mg total) by mouth every 8 (eight) hours as needed for nausea or vomiting. 20 tablet 0   ondansetron (ZOFRAN-ODT) 4 MG disintegrating tablet Take 1 tablet (4 mg total) by mouth  every 8 (eight) hours as needed. 20 tablet 3   pantoprazole (PROTONIX) 40 MG tablet TAKE 1 TABLET (40 MG TOTAL) BY MOUTH DAILY. 90 tablet 3   Rimegepant Sulfate (NURTEC) 75 MG TBDP Take 75 mg by mouth as needed (take 1 at onset of headache, max is 1 tablet in 24 hours). 12 tablet 11   SUMAtriptan (IMITREX) 100 MG tablet TAKE 1 TABLET (100 MG TOTAL) BY MOUTH ONCE AS NEEDED FOR UP TO 1 DOSE FOR MIGRAINE. MAY REPEAT IN 2 HOURS IF HEADACHE PERSISTS OR RECURS. 12 tablet 11   tiZANidine (ZANAFLEX) 4 MG tablet TAKE 1 TABLET (4 MG TOTAL) BY MOUTH EVERY 6 (SIX) HOURS AS NEEDED. 30 tablet 6   topiramate (TOPAMAX) 25 MG tablet Take 1 tablet by mouth for 3 days at bedtime, then take 2 tablets for 3 days, then take 3 tablets at bedtime. 90 tablet 6   zolpidem (AMBIEN) 5 MG tablet TAKE 1-2 TABLETS (5-10 MG TOTAL) BY MOUTH AT BEDTIME AS NEEDED FOR SLEEP. 30 tablet 0   No current facility-administered medications for this visit.    Allergies:   Morphine and related    Social History:  The patient  reports that she has never smoked. She has never used smokeless tobacco. She reports current alcohol use. She reports that she does not use drugs.   Family History:  The patient's family history includes Anxiety disorder in her mother; Diabetes in her maternal aunt and mother; Gout in her mother; High Cholesterol in her mother; Hypertension in her mother; Liver cancer in her maternal aunt; Obesity in her mother; Pancreatic cancer in her sister; Stroke in her father and sister.    ROS:  Please see the history of present illness.   Otherwise, review of systems are positive for none.   All other systems are reviewed and negative.    PHYSICAL EXAM: VS:  BP 118/76 (BP Location: Right Arm, Patient Position: Sitting, Cuff Size: Large)   Pulse 86   Ht 5\' 5"  (1.651 m)   Wt 206 lb 3.2 oz (93.5 kg)   LMP  (LMP Unknown) Comment: pt. states that it has been over 1 year  SpO2 99%   BMI 34.31 kg/m  , BMI Body mass index is  34.31 kg/m. GENERAL:  Well appearing HEENT:  Pupils equal round and reactive, fundi not visualized, oral mucosa unremarkable NECK:  No  jugular venous distention, waveform within normal limits, carotid upstroke brisk and symmetric, no bruits, no thyromegaly LYMPHATICS:  No cervical, inguinal adenopathy LUNGS:  Clear to auscultation bilaterally BACK:  No CVA tenderness CHEST:  Unremarkable HEART:  PMI not displaced or sustained,S1 and S2 within normal limits, no S3, no S4, no clicks, no rubs, no murmurs ABD:  Flat, positive bowel sounds normal in frequency in pitch, no bruits, no rebound, no guarding, no midline pulsatile mass, no hepatomegaly, no splenomegaly EXT:  2 plus pulses throughout, no edema, no cyanosis no clubbing SKIN:  No rashes no nodules NEURO:  Cranial nerves II through XII grossly intact, motor grossly intact throughout PSYCH:  Cognitively intact, oriented to person place and time    EKG:  EKG is not ordered today. The ekg ordered 04/06/2021 demonstrates sinus rhythm, rate 74, axis within normal limits, intervals within normal limits, no acute ST-T wave changes.   Recent Labs: 10/03/2020: BNP 38.7 12/01/2020: ALT 15; BUN 9; Creatinine 0.80; Potassium 4.1; Sodium 142 04/06/2021: Hemoglobin 12.7; Platelets 202.0; TSH 0.41 04/07/2021: Pro B Natriuretic peptide (BNP) 26.0    Lipid Panel    Component Value Date/Time   CHOL 216 (H) 07/02/2020 0851   TRIG 100 07/02/2020 0851   HDL 79 07/02/2020 0851   CHOLHDL 3.1 04/02/2020 1109   CHOLHDL 2.8 03/01/2016 1053   VLDL 26 03/01/2016 1053   LDLCALC 120 (H) 07/02/2020 0851      Wt Readings from Last 3 Encounters:  04/16/21 206 lb 3.2 oz (93.5 kg)  04/06/21 210 lb 9.6 oz (95.5 kg)  03/18/21 212 lb (96.2 kg)      Other studies Reviewed: Additional studies/ records that were reviewed today include: Labs. Review of the above records demonstrates:  Please see elsewhere in the note.     ASSESSMENT AND PLAN:  CHEST PAIN:  Her chest pain has nonanginal greater than anginal features but she does have risk factors. I will bring the patient back for a POET (Plain Old Exercise Test). This will allow me to screen for obstructive coronary disease, risk stratify and very importantly provide a prescription for exercise.  If this is negative I would not suggest further work-up for cardiac etiology.  DYSLIPIDEMIA: She does have an elevated LDL at 120 but her HDL was 79.  Goals of therapy will be based on the work-up as above.  HTN: Her blood pressure is controlled.  No change in therapy.   Current medicines are reviewed at length with the patient today.  The patient does not have concerns regarding medicines.  The following changes have been made:  no change  Labs/ tests ordered today include:   Orders Placed This Encounter  Procedures   EXERCISE TOLERANCE TEST (ETT)      Disposition:   FU with me as needed based on the results of the above.   Signed, Minus Breeding, MD  04/16/2021 9:08 AM    [place]

## 2021-04-15 NOTE — Telephone Encounter (Signed)
Per Dr. Rosezella Florida request, attempted to call patient to remind her of her appointment on 6/23. No answer, left nonspecific message for patient to call office back.

## 2021-04-16 ENCOUNTER — Ambulatory Visit (INDEPENDENT_AMBULATORY_CARE_PROVIDER_SITE_OTHER): Payer: 59 | Admitting: Cardiology

## 2021-04-16 ENCOUNTER — Encounter: Payer: Self-pay | Admitting: Cardiology

## 2021-04-16 ENCOUNTER — Other Ambulatory Visit: Payer: Self-pay

## 2021-04-16 VITALS — BP 118/76 | HR 86 | Ht 65.0 in | Wt 206.2 lb

## 2021-04-16 DIAGNOSIS — R0602 Shortness of breath: Secondary | ICD-10-CM | POA: Diagnosis not present

## 2021-04-16 NOTE — Patient Instructions (Signed)
Medication Instructions:  Your physician recommends that you continue on your current medications as directed. Please refer to the Current Medication list given to you today.  *If you need a refill on your cardiac medications before your next appointment, please call your pharmacy*   Lab Work: None ordered.    Testing/Procedures: Your physician has requested that you have an exercise tolerance test. For further information please visit [email or web address]. Please also follow instruction sheet, as given.    Follow-Up: At [facility], you and your health needs are our priority.  As part of our continuing mission to provide you with exceptional heart care, we have created designated Provider Care Teams.  These Care Teams include your primary Cardiologist (physician) and Advanced Practice Providers (APPs -  Physician Assistants and Nurse Practitioners) who all work together to provide you with the care you need, when you need it.  We recommend signing up for the patient portal called "MyChart".  Sign up information is provided on this After Visit Summary.  MyChart is used to connect with patients for Virtual Visits (Telemedicine).  Patients are able to view lab/test results, encounter notes, upcoming appointments, etc.  Non-urgent messages can be sent to your provider as well.   To learn more about what you can do with MyChart, go to [email or web address].    Your next appointment:   As needed  The format for your next appointment:   In Person  Provider:   James Hochrein, MD     

## 2021-04-21 ENCOUNTER — Telehealth (HOSPITAL_COMMUNITY): Payer: Self-pay | Admitting: *Deleted

## 2021-04-21 NOTE — Telephone Encounter (Signed)
Close encounter 

## 2021-04-22 ENCOUNTER — Other Ambulatory Visit: Payer: Self-pay

## 2021-04-22 ENCOUNTER — Ambulatory Visit (HOSPITAL_COMMUNITY)
Admission: RE | Admit: 2021-04-22 | Discharge: 2021-04-22 | Disposition: A | Discharge status: Home / Self Care | Payer: 59 | Source: Ambulatory Visit | Attending: Cardiovascular Disease | Admitting: Cardiovascular Disease

## 2021-04-22 DIAGNOSIS — R0602 Shortness of breath: Secondary | ICD-10-CM | POA: Insufficient documentation

## 2021-04-22 LAB — EXERCISE TOLERANCE TEST
Estimated workload: 7 METS
Exercise duration (min): 6 min
Exercise duration (sec): 1 s
MPHR: 161 {beats}/min
Peak HR: 162 {beats}/min
Percent HR: 100 %
Rest HR: 86 {beats}/min

## 2021-05-04 ENCOUNTER — Encounter: Payer: Self-pay | Admitting: *Deleted

## 2021-05-06 ENCOUNTER — Encounter: Payer: Self-pay | Admitting: Registered Nurse

## 2021-05-06 ENCOUNTER — Other Ambulatory Visit (HOSPITAL_COMMUNITY): Payer: Self-pay

## 2021-05-06 ENCOUNTER — Other Ambulatory Visit: Payer: Self-pay

## 2021-05-06 ENCOUNTER — Ambulatory Visit (INDEPENDENT_AMBULATORY_CARE_PROVIDER_SITE_OTHER): Payer: 59 | Admitting: Registered Nurse

## 2021-05-06 VITALS — BP 130/90 | HR 85 | Temp 97.9°F | Resp 18 | Ht 65.0 in | Wt 208.0 lb

## 2021-05-06 DIAGNOSIS — F411 Generalized anxiety disorder: Secondary | ICD-10-CM

## 2021-05-06 DIAGNOSIS — R110 Nausea: Secondary | ICD-10-CM

## 2021-05-06 DIAGNOSIS — K219 Gastro-esophageal reflux disease without esophagitis: Secondary | ICD-10-CM

## 2021-05-06 DIAGNOSIS — E039 Hypothyroidism, unspecified: Secondary | ICD-10-CM | POA: Diagnosis not present

## 2021-05-06 DIAGNOSIS — I10 Essential (primary) hypertension: Secondary | ICD-10-CM | POA: Diagnosis not present

## 2021-05-06 DIAGNOSIS — R0600 Dyspnea, unspecified: Secondary | ICD-10-CM | POA: Diagnosis not present

## 2021-05-06 DIAGNOSIS — Z0000 Encounter for general adult medical examination without abnormal findings: Secondary | ICD-10-CM | POA: Diagnosis not present

## 2021-05-06 DIAGNOSIS — E7800 Pure hypercholesterolemia, unspecified: Secondary | ICD-10-CM | POA: Diagnosis not present

## 2021-05-06 DIAGNOSIS — R0609 Other forms of dyspnea: Secondary | ICD-10-CM

## 2021-05-06 DIAGNOSIS — D509 Iron deficiency anemia, unspecified: Secondary | ICD-10-CM

## 2021-05-06 DIAGNOSIS — H60501 Unspecified acute noninfective otitis externa, right ear: Secondary | ICD-10-CM

## 2021-05-06 LAB — COMPREHENSIVE METABOLIC PANEL
ALT: 16 U/L (ref 0–35)
AST: 17 U/L (ref 0–37)
Albumin: 4.2 g/dL (ref 3.5–5.2)
Alkaline Phosphatase: 97 U/L (ref 39–117)
BUN: 8 mg/dL (ref 6–23)
CO2: 29 mEq/L (ref 19–32)
Calcium: 9.9 mg/dL (ref 8.4–10.5)
Chloride: 106 mEq/L (ref 96–112)
Creatinine, Ser: 0.85 mg/dL (ref 0.40–1.20)
GFR: 75.08 mL/min (ref >60.00)
Glucose, Bld: 63 mg/dL — ABNORMAL LOW (ref 70–99)
Potassium: 4 mEq/L (ref 3.5–5.1)
Sodium: 142 mEq/L (ref 135–145)
Total Bilirubin: 0.5 mg/dL (ref 0.2–1.2)
Total Protein: 6.6 g/dL (ref 6.0–8.3)

## 2021-05-06 LAB — LIPID PANEL
Cholesterol: 225 mg/dL — ABNORMAL HIGH (ref 0–200)
HDL: 75 mg/dL (ref >39.00)
LDL Cholesterol: 131 mg/dL — ABNORMAL HIGH (ref 0–99)
NonHDL: 149.5
Total CHOL/HDL Ratio: 3
Triglycerides: 92 mg/dL (ref 0.0–149.0)
VLDL: 18.4 mg/dL (ref 0.0–40.0)

## 2021-05-06 MED ORDER — ONDANSETRON HCL 4 MG PO TABS
4.0000 mg | ORAL_TABLET | Freq: Three times a day (TID) | ORAL | 0 refills | Status: DC | PRN
  Filled 2021-05-06: qty 20, 7d supply, fill #0

## 2021-05-06 MED ORDER — HYDROCORTISONE-ACETIC ACID 1-2 % OT SOLN
3.0000 [drp] | Freq: Two times a day (BID) | OTIC | 0 refills | Status: DC
  Filled 2021-05-06: qty 10, 30d supply, fill #0

## 2021-05-06 MED ORDER — ATORVASTATIN CALCIUM 40 MG PO TABS
40.0000 mg | ORAL_TABLET | ORAL | 1 refills | Status: DC
  Filled 2021-05-06: qty 90, fill #0

## 2021-05-06 MED ORDER — BUSPIRONE HCL 10 MG PO TABS
10.0000 mg | ORAL_TABLET | Freq: Two times a day (BID) | ORAL | 0 refills | Status: DC
Start: 2021-05-06 — End: 2022-03-04
  Filled 2021-05-06: qty 180, 90d supply, fill #0

## 2021-05-06 MED ORDER — LOSARTAN POTASSIUM 25 MG PO TABS
25.0000 mg | ORAL_TABLET | Freq: Every day | ORAL | 1 refills | Status: DC
  Filled 2021-05-06: qty 90, fill #0
  Filled 2021-07-23 – 2021-08-03 (×2): qty 90, 90d supply, fill #0
  Filled 2021-10-11: qty 90, 90d supply, fill #1

## 2021-05-06 MED ORDER — LEVOTHYROXINE SODIUM 125 MCG PO TABS
125.0000 ug | ORAL_TABLET | Freq: Every day | ORAL | 0 refills | Status: DC
Start: 2021-05-06 — End: 2021-07-23
  Filled 2021-05-06: qty 90, 90d supply, fill #0

## 2021-05-06 MED ORDER — PANTOPRAZOLE SODIUM 40 MG PO TBEC
40.0000 mg | DELAYED_RELEASE_TABLET | Freq: Every day | ORAL | 3 refills | Status: DC
  Filled 2021-05-06: qty 90, fill #0

## 2021-05-06 MED ORDER — CITALOPRAM HYDROBROMIDE 20 MG PO TABS
30.0000 mg | ORAL_TABLET | Freq: Every day | ORAL | 1 refills | Status: DC
  Filled 2021-05-06: qty 145, fill #0

## 2021-05-06 MED ORDER — ALBUTEROL SULFATE HFA 108 (90 BASE) MCG/ACT IN AERS
2.0000 | INHALATION_SPRAY | Freq: Four times a day (QID) | RESPIRATORY_TRACT | 0 refills | Status: DC | PRN
  Filled 2021-05-06: qty 8.5, 25d supply, fill #0

## 2021-05-06 NOTE — Progress Notes (Addendum)
Established Patient Office Visit  Subjective:  Patient ID: Julie Miranda, female    DOB: 1961-08-03  Age: 60 y.o. MRN: [id number]  CC:  Chief Complaint  Patient presents with   Annual Exam    Patient states she is here for a CPE. Patient states she also need some medication refills.    HPI Julie Miranda presents for Annual Exam   No acute concerns  Needs all chronic meds refilled  Notes she is doing very well from a fatigue and mental health standpoint after cutting back her hours at work a bit.  Up to date on colon ca and cervical ca screening  Eye exam within past few months, has false teeth and up to date on dental screening  Past Medical History:  Diagnosis Date   Anemia    Anxiety    Arthritis    Colon polyps    x 3   Depression    Edema of both lower extremities    Family history of adverse reaction to anesthesia    makes mother" loopy"   Gastric ulcer    GERD (gastroesophageal reflux disease)    Graves disease    History of pancytopenia    of unknown etiology    Hyperlipidemia    Hypertension    Hypothyroidism    IBS (irritable bowel syndrome)    Migraine    Osteoarthritis    Thyroid disease    Vitamin D deficiency     Past Surgical History:  Procedure Laterality Date   COLONOSCOPY     GASTRIC BYPASS     JOINT REPLACEMENT N/A    [employer] 06/12/2020   POLYPECTOMY     TOTAL KNEE ARTHROPLASTY Left 07/30/2019   Procedure: LEFT TOTAL KNEE ARTHROPLASTY;  Surgeon: Leandrew Koyanagi, MD;  Location: [facility];  Service: Orthopedics;  Laterality: Left;    Family History  Problem Relation Age of Onset   Hypertension Mother    Gout Mother    Diabetes Mother    High Cholesterol Mother    Anxiety disorder Mother    Obesity Mother    Stroke Father    Stroke Sister    Pancreatic cancer Sister    Diabetes Maternal Aunt        x 2   Liver cancer Maternal Aunt    Colon cancer Neg Hx    Kidney disease Neg Hx    Colon polyps Neg Hx    Esophageal cancer  Neg Hx    Rectal cancer Neg Hx    Stomach cancer Neg Hx     Social History   Socioeconomic History   Marital status: Single    Spouse name: Not on file   Number of children: 0   Years of education: some college   Highest education level: Not on file  Occupational History   Occupation: Chartered certified accountant  Tobacco Use   Smoking status: Never   Smokeless tobacco: Never  Vaping Use   Vaping Use: Never used  Substance and Sexual Activity   Alcohol use: Yes    Comment: occasional   Drug use: No   Sexual activity: Not Currently    Birth control/protection: Injection  Other Topics Concern   Not on file  Social History Narrative   Lives at home with her mother.   Right-handed.   One cup caffeine per day.   Social Determinants of Health   Financial Resource Strain: Not on file  Food Insecurity: Not on file  Transportation Needs:  Not on file  Physical Activity: Not on file  Stress: Not on file  Social Connections: Not on file  Intimate Partner Violence: Not on file    Outpatient Medications Prior to Visit  Medication Sig Dispense Refill   albuterol (VENTOLIN HFA) 108 (90 Base) MCG/ACT inhaler INHALE 2 PUFFS INTO THE LUNGS EVERY 6 (SIX) HOURS AS NEEDED FOR WHEEZING OR SHORTNESS OF BREATH. 8.5 g 0   atorvastatin (LIPITOR) 40 MG tablet TAKE 1 TABLET (40 MG TOTAL) BY MOUTH EVERY OTHER DAY. 90 tablet 1   busPIRone (BUSPAR) 10 MG tablet TAKE 1 TABLET (10 MG TOTAL) BY MOUTH 2 (TWO) TIMES DAILY. 180 tablet 0   citalopram (CELEXA) 20 MG tablet TAKE 1.5 TABLETS (30 MG TOTAL) BY MOUTH DAILY. 145 tablet 1   cyclobenzaprine (FLEXERIL) 10 MG tablet Take 10 mg by mouth at bedtime.     dicyclomine (BENTYL) 20 MG tablet TAKE 1 TABLET (20 MG TOTAL) BY MOUTH 3 (THREE) TIMES DAILY BEFORE MEALS. TAKE ONLY AS NEEDED FOR ABDOMINAL PAIN. 60 tablet 0   gabapentin (NEURONTIN) 100 MG capsule Take 300 mg by mouth every other day.     levothyroxine (SYNTHROID) 125 MCG tablet TAKE 1 TABLET (125 MCG TOTAL) BY  MOUTH DAILY. 30 tablet 0   losartan (COZAAR) 25 MG tablet TAKE 1 TABLET (25 MG TOTAL) BY MOUTH DAILY. 90 tablet 1   lubiprostone (AMITIZA) 24 MCG capsule TAKE 1 CAPSULE (24 MCG TOTAL) BY MOUTH 2 (TWO) TIMES DAILY WITH A MEAL. 60 capsule 3   methocarbamol (ROBAXIN) 500 MG tablet Take 250 mg by mouth every 8 (eight) hours as needed for muscle spasms.     ondansetron (ZOFRAN) 4 MG tablet Take 1 tablet (4 mg total) by mouth every 8 (eight) hours as needed for nausea or vomiting. 20 tablet 0   pantoprazole (PROTONIX) 40 MG tablet TAKE 1 TABLET (40 MG TOTAL) BY MOUTH DAILY. 90 tablet 3   Rimegepant Sulfate (NURTEC) 75 MG TBDP Take 75 mg by mouth as needed (take 1 at onset of headache, max is 1 tablet in 24 hours). 12 tablet 11   SUMAtriptan (IMITREX) 100 MG tablet TAKE 1 TABLET (100 MG TOTAL) BY MOUTH ONCE AS NEEDED FOR UP TO 1 DOSE FOR MIGRAINE. MAY REPEAT IN 2 HOURS IF HEADACHE PERSISTS OR RECURS. 12 tablet 11   tiZANidine (ZANAFLEX) 4 MG tablet TAKE 1 TABLET (4 MG TOTAL) BY MOUTH EVERY 6 (SIX) HOURS AS NEEDED. 30 tablet 6   topiramate (TOPAMAX) 25 MG tablet Take 1 tablet by mouth for 3 days at bedtime, then take 2 tablets for 3 days, then take 3 tablets at bedtime. 90 tablet 6   ondansetron (ZOFRAN-ODT) 4 MG disintegrating tablet Take 1 tablet (4 mg total) by mouth every 8 (eight) hours as needed. 20 tablet 3   zolpidem (AMBIEN) 5 MG tablet TAKE 1-2 TABLETS (5-10 MG TOTAL) BY MOUTH AT BEDTIME AS NEEDED FOR SLEEP. 30 tablet 0   No facility-administered medications prior to visit.    Allergies  Allergen Reactions   Morphine And Related Nausea And Vomiting    ROS Review of Systems  Constitutional: Negative.   HENT: Negative.    Eyes: Negative.   Respiratory: Negative.    Cardiovascular: Negative.   Gastrointestinal: Negative.   Genitourinary: Negative.   Musculoskeletal: Negative.   Skin: Negative.   Neurological: Negative.   Psychiatric/Behavioral: Negative.    All other systems reviewed  and are negative.    Objective:    Physical  Exam Vitals and nursing note reviewed.  Constitutional:      General: She is not in acute distress.    Appearance: Normal appearance. She is normal weight. She is not ill-appearing, toxic-appearing or diaphoretic.  HENT:     Head: Normocephalic and atraumatic.     Right Ear: Tympanic membrane and external ear normal. There is no impacted cerumen.     Left Ear: Tympanic membrane, ear canal and external ear normal. There is no impacted cerumen.     Ears:     Comments: Coincidental finding of R otitis externa. Appears mild, noninfectious. Upon asking, pt does note some pressure in that ear. No drainage or change to hearing.    Nose: Nose normal. No congestion or rhinorrhea.     Mouth/Throat:     Mouth: Mucous membranes are moist.     Pharynx: Oropharynx is clear. No oropharyngeal exudate or posterior oropharyngeal erythema.  Eyes:     General: No scleral icterus.       Right eye: No discharge.        Left eye: No discharge.     Extraocular Movements: Extraocular movements intact.     Conjunctiva/sclera: Conjunctivae normal.     Pupils: Pupils are equal, round, and reactive to light.  Cardiovascular:     Rate and Rhythm: Normal rate and regular rhythm.     Pulses: Normal pulses.     Heart sounds: Normal heart sounds. No murmur heard.   No friction rub. No gallop.  Pulmonary:     Effort: Pulmonary effort is normal. No respiratory distress.     Breath sounds: Normal breath sounds. No stridor. No wheezing, rhonchi or rales.  Chest:     Chest wall: No tenderness.  Abdominal:     General: Abdomen is flat. Bowel sounds are normal. There is no distension.     Palpations: Abdomen is soft. There is no mass.     Tenderness: There is no abdominal tenderness. There is no right CVA tenderness, left CVA tenderness, guarding or rebound.     Hernia: No hernia is present.  Musculoskeletal:        General: No swelling, tenderness, deformity or signs of  injury. Normal range of motion.     Right lower leg: No edema.     Left lower leg: No edema.  Skin:    General: Skin is warm and dry.     Capillary Refill: Capillary refill takes less than 2 seconds.     Coloration: Skin is not jaundiced or pale.     Findings: No bruising, erythema, lesion or rash.  Neurological:     General: No focal deficit present.     Mental Status: She is alert and oriented to person, place, and time. Mental status is at baseline.     Cranial Nerves: No cranial nerve deficit.     Sensory: No sensory deficit.     Motor: No weakness.     Coordination: Coordination normal.     Gait: Gait normal.     Deep Tendon Reflexes: Reflexes normal.  Psychiatric:        Mood and Affect: Mood normal.        Behavior: Behavior normal.        Thought Content: Thought content normal.        Judgment: Judgment normal.    BP 130/90   Pulse 85   Temp 97.9 F (36.6 C) (Temporal)   Resp 18   Ht 5\' 5"  (0.347 m)  Wt 208 lb (94.3 kg)   LMP  (LMP Unknown) Comment: pt. states that it has been over 1 year  SpO2 99%   BMI 34.61 kg/m  Wt Readings from Last 3 Encounters:  05/06/21 208 lb (94.3 kg)  04/16/21 206 lb 3.2 oz (93.5 kg)  04/06/21 210 lb 9.6 oz (95.5 kg)     There are no preventive care reminders to display for this patient.  There are no preventive care reminders to display for this patient.  Lab Results  Component Value Date   TSH 0.41 04/06/2021   Lab Results  Component Value Date   WBC 3.0 (L) 04/06/2021   HGB 12.7 04/06/2021   HCT 39.5 04/06/2021   MCV 89.5 04/06/2021   PLT 202.0 04/06/2021   Lab Results  Component Value Date   NA 142 12/01/2020   K 4.1 12/01/2020   CO2 27 12/01/2020   GLUCOSE 93 12/01/2020   BUN 9 12/01/2020   CREATININE 0.80 12/01/2020   BILITOT 0.3 12/01/2020   ALKPHOS 95 12/01/2020   AST 21 12/01/2020   ALT 15 12/01/2020   PROT 7.2 12/01/2020   ALBUMIN 3.9 12/01/2020   CALCIUM 9.5 12/01/2020   ANIONGAP 6 12/01/2020    Lab Results  Component Value Date   CHOL 216 (H) 07/02/2020   Lab Results  Component Value Date   HDL 79 07/02/2020   Lab Results  Component Value Date   LDLCALC 120 (H) 07/02/2020   Lab Results  Component Value Date   TRIG 100 07/02/2020   Lab Results  Component Value Date   CHOLHDL 3.1 04/02/2020   Lab Results  Component Value Date   HGBA1C 5.7 04/06/2021      Assessment & Plan:   Problem List Items Addressed This Visit       Cardiovascular and Mediastinum   Essential hypertension, benign   Relevant Medications   losartan (COZAAR) 25 MG tablet   atorvastatin (LIPITOR) 40 MG tablet   Other Relevant Orders   Comprehensive metabolic panel     Digestive   GERD (gastroesophageal reflux disease)   Relevant Medications   pantoprazole (PROTONIX) 40 MG tablet   ondansetron (ZOFRAN) 4 MG tablet     Endocrine   Hypothyroidism   Relevant Medications   levothyroxine (SYNTHROID) 125 MCG tablet     Other   Generalized anxiety disorder   Relevant Medications   busPIRone (BUSPAR) 10 MG tablet   citalopram (CELEXA) 20 MG tablet   Pure hypercholesterolemia   Relevant Medications   losartan (COZAAR) 25 MG tablet   atorvastatin (LIPITOR) 40 MG tablet   Other Relevant Orders   Lipid panel   Iron deficiency anemia   Relevant Orders   CBC with Differential/Platelet   Iron, TIBC and Ferritin Panel   Other Visit Diagnoses     Annual physical exam    -  Primary   Nausea without vomiting       Relevant Medications   ondansetron (ZOFRAN) 4 MG tablet   DOE (dyspnea on exertion)       Relevant Medications   albuterol (VENTOLIN HFA) 108 (90 Base) MCG/ACT inhaler       Meds ordered this encounter  Medications   levothyroxine (SYNTHROID) 125 MCG tablet    Sig: TAKE 1 TABLET (125 MCG TOTAL) BY MOUTH DAILY.    Dispense:  90 tablet    Refill:  0    Order Specific Question:   Supervising Provider    Answer:   Carlota Raspberry,  JEFFREY R [[id number]]   losartan (COZAAR) 25 MG  tablet    Sig: TAKE 1 TABLET (25 MG TOTAL) BY MOUTH DAILY.    Dispense:  90 tablet    Refill:  1    Order Specific Question:   Supervising Provider    Answer:   Carlota Raspberry, JEFFREY R [[id number]]   pantoprazole (PROTONIX) 40 MG tablet    Sig: TAKE 1 TABLET (40 MG TOTAL) BY MOUTH DAILY.    Dispense:  90 tablet    Refill:  3    Order Specific Question:   Supervising Provider    Answer:   Carlota Raspberry, JEFFREY R [[id number]]   ondansetron (ZOFRAN) 4 MG tablet    Sig: Take 1 tablet (4 mg total) by mouth every 8 (eight) hours as needed for nausea or vomiting.    Dispense:  20 tablet    Refill:  0    Order Specific Question:   Supervising Provider    Answer:   Carlota Raspberry, JEFFREY R [[id number]]   busPIRone (BUSPAR) 10 MG tablet    Sig: TAKE 1 TABLET (10 MG TOTAL) BY MOUTH 2 (TWO) TIMES DAILY.    Dispense:  180 tablet    Refill:  0    Order Specific Question:   Supervising Provider    Answer:   Carlota Raspberry, JEFFREY R [[id number]]   albuterol (VENTOLIN HFA) 108 (90 Base) MCG/ACT inhaler    Sig: INHALE 2 PUFFS INTO THE LUNGS EVERY 6 (SIX) HOURS AS NEEDED FOR WHEEZING OR SHORTNESS OF BREATH.    Dispense:  8.5 g    Refill:  0    Order Specific Question:   Supervising Provider    Answer:   Carlota Raspberry, JEFFREY R [[id number]]   atorvastatin (LIPITOR) 40 MG tablet    Sig: TAKE 1 TABLET (40 MG TOTAL) BY MOUTH EVERY OTHER DAY.    Dispense:  90 tablet    Refill:  1    Order Specific Question:   Supervising Provider    Answer:   Carlota Raspberry, JEFFREY R [[id number]]   citalopram (CELEXA) 20 MG tablet    Sig: TAKE 1.5 TABLETS (30 MG TOTAL) BY MOUTH DAILY.    Dispense:  145 tablet    Refill:  1    Order Specific Question:   Supervising Provider    Answer:   Carlota Raspberry, JEFFREY R [[id number]]    Follow-up: Return in about 3 months (around 08/06/2021) for Thyroid.   PLAN Exam unremarkable Meds refilled as above [facility] for otitis. Return if worsening or failing to improve Return in 3 mo for check on chronic conditions and thyroid - can push this to 6 mo if labs  stable Patient encouraged to call clinic with any questions, comments, or concerns.   [name], NP

## 2021-05-06 NOTE — Patient Instructions (Signed)
Ms. Julie Miranda to see you  Glad things are going well  Exam today normal   Labs today will be back today or tomorrow  I'll let you know how they look  Meds refilled -  Depending on labs, see you in 3 or 6- I'll have you book a 3 mo just in case but we can push this back if labs are encouraging.  Let me know if I can do anything for you  Thank you  Denice Paradise

## 2021-05-06 NOTE — Addendum Note (Signed)
Addended by: [name] on: 05/06/2021 09:41 AM   Modules accepted: Orders

## 2021-05-07 ENCOUNTER — Other Ambulatory Visit (HOSPITAL_COMMUNITY): Payer: Self-pay

## 2021-05-07 LAB — IRON,TIBC AND FERRITIN PANEL
%SAT: 20 % (calc) (ref 16–45)
Ferritin: 10 ng/mL — ABNORMAL LOW (ref 16–232)
Iron: 81 ug/dL (ref 45–160)
TIBC: 401 mcg/dL (calc) (ref 250–450)

## 2021-05-07 LAB — CBC WITH DIFFERENTIAL/PLATELET
Basophils Absolute: 0 10*3/uL (ref 0.0–0.1)
Basophils Relative: 0.8 % (ref 0.0–3.0)
Eosinophils Absolute: 0.1 10*3/uL (ref 0.0–0.7)
Eosinophils Relative: 3.5 % (ref 0.0–5.0)
HCT: 39.4 % (ref 36.0–46.0)
Hemoglobin: 13.3 g/dL (ref 12.0–15.0)
Lymphocytes Relative: 30.8 % (ref 12.0–46.0)
Lymphs Abs: 1.1 10*3/uL (ref 0.7–4.0)
MCHC: 33.7 g/dL (ref 30.0–36.0)
MCV: 91 fl (ref 78.0–100.0)
Monocytes Absolute: 0.3 10*3/uL (ref 0.1–1.0)
Monocytes Relative: 9.8 % (ref 3.0–12.0)
Neutro Abs: 2 10*3/uL (ref 1.4–7.7)
Neutrophils Relative %: 55.1 % (ref 43.0–77.0)
Platelets: 250 10*3/uL (ref 150.0–400.0)
RBC: 4.33 Mil/uL (ref 3.87–5.11)
RDW: 16.2 % — ABNORMAL HIGH (ref 11.5–15.5)
WBC: 3.6 10*3/uL — ABNORMAL LOW (ref 4.0–10.5)

## 2021-07-08 NOTE — Progress Notes (Signed)
Chart reviewed, agree above plan ?

## 2021-07-23 ENCOUNTER — Other Ambulatory Visit: Payer: Self-pay | Admitting: Registered Nurse

## 2021-07-23 ENCOUNTER — Other Ambulatory Visit (HOSPITAL_COMMUNITY): Payer: Self-pay

## 2021-07-23 DIAGNOSIS — H60501 Unspecified acute noninfective otitis externa, right ear: Secondary | ICD-10-CM

## 2021-07-23 DIAGNOSIS — E039 Hypothyroidism, unspecified: Secondary | ICD-10-CM

## 2021-07-23 MED ORDER — LEVOTHYROXINE SODIUM 125 MCG PO TABS
125.0000 ug | ORAL_TABLET | Freq: Every day | ORAL | 0 refills | Status: DC
  Filled 2021-07-23 – 2021-08-03 (×2): qty 90, 90d supply, fill #0

## 2021-07-23 MED ORDER — HYDROCORTISONE-ACETIC ACID 1-2 % OT SOLN
3.0000 [drp] | Freq: Two times a day (BID) | OTIC | 0 refills | Status: DC
  Filled 2021-07-23 – 2021-08-03 (×2): qty 10, 30d supply, fill #0

## 2021-07-24 ENCOUNTER — Other Ambulatory Visit (HOSPITAL_COMMUNITY): Payer: Self-pay

## 2021-07-31 ENCOUNTER — Other Ambulatory Visit (HOSPITAL_COMMUNITY): Payer: Self-pay

## 2021-08-03 ENCOUNTER — Other Ambulatory Visit (HOSPITAL_COMMUNITY): Payer: Self-pay

## 2021-08-07 ENCOUNTER — Ambulatory Visit: Payer: 59 | Admitting: Registered Nurse

## 2021-09-05 ENCOUNTER — Ambulatory Visit (INDEPENDENT_AMBULATORY_CARE_PROVIDER_SITE_OTHER): Payer: 59

## 2021-09-05 ENCOUNTER — Other Ambulatory Visit: Payer: Self-pay

## 2021-09-05 ENCOUNTER — Ambulatory Visit (HOSPITAL_COMMUNITY)
Admission: EM | Admit: 2021-09-05 | Discharge: 2021-09-05 | Disposition: A | Discharge status: Home / Self Care | Payer: 59 | Attending: Emergency Medicine | Admitting: Emergency Medicine

## 2021-09-05 ENCOUNTER — Encounter (HOSPITAL_COMMUNITY): Payer: Self-pay

## 2021-09-05 DIAGNOSIS — S63501A Unspecified sprain of right wrist, initial encounter: Secondary | ICD-10-CM | POA: Diagnosis not present

## 2021-09-05 DIAGNOSIS — M25531 Pain in right wrist: Secondary | ICD-10-CM

## 2021-09-05 DIAGNOSIS — M7989 Other specified soft tissue disorders: Secondary | ICD-10-CM | POA: Diagnosis not present

## 2021-09-05 NOTE — ED Provider Notes (Signed)
[facility]    CSN: [id number] Arrival date & time: 09/05/21  1025      History   Chief Complaint Chief Complaint  Patient presents with   Wrist Pain    HPI Julie Miranda is a 60 y.o. female.   HPI Julie Miranda is a 60 y.o. female presenting to UC with c/o continued Right wrist pain and swelling after falling on outstretched hands 1 week ago. Pain is 8/10, aching.  Pt reports getting tangled in her dog leashes resulting in the fall. Denies hitting her head in the fall. Pain is aching and sore, worse with certain movements specifically grasping and rotating wrists when opening a door handle.  She is Right hand dominant. No prior injury to same wrist.     Past Medical History:  Diagnosis Date   Anemia    Anxiety    Arthritis    Colon polyps    x 3   Depression    Edema of both lower extremities    Family history of adverse reaction to anesthesia    makes mother" loopy"   Gastric ulcer    GERD (gastroesophageal reflux disease)    Graves disease    History of pancytopenia    of unknown etiology    Hyperlipidemia    Hypertension    Hypothyroidism    IBS (irritable bowel syndrome)    Migraine    Osteoarthritis    Thyroid disease    Vitamin D deficiency     Patient Active Problem List   Diagnosis Date Noted   Vitamin D deficiency 12/29/2020   Iron deficiency 12/16/2020   Chronic migraine w/o aura w/o status migrainosus, not intractable 12/16/2020   Iron deficiency anemia 12/01/2020   Class 2 severe obesity with serious comorbidity and body mass index (BMI) of 35.0 to 35.9 in adult [facility]) 07/08/2020   Chronic migraine 04/30/2020   Status post total left knee replacement 07/30/2019   Primary osteoarthritis of left knee 06/05/2019   Chronic pain of left knee 06/05/2019   Carpal tunnel syndrome 04/25/2017   Cervicalgia 07/30/2016   Synovitis of shoulder 05/21/2016   Bilateral shoulder pain 03/08/2016   Pure hypercholesterolemia 03/03/2016    Costochondritis 03/01/2016   Generalized anxiety disorder 03/01/2016   De Quervain's tenosynovitis, bilateral 07/23/2015   Neutropenia ([place]) 07/11/2015   GERD (gastroesophageal reflux disease) 10/30/2013   Endometrial mass 10/04/2013   Leukocytopenia, unspecified 09/24/2013   Dehydration 06/28/2013   Anemia, unspecified 06/28/2013   Acute low back pain due to trauma 06/28/2013   Hypothyroidism 06/27/2013   Syncope and collapse 06/27/2013   Arthritis 06/27/2013   Acute renal failure ([place]) 06/27/2013   Essential hypertension, benign 06/27/2013   Disc disorder/L4-5 prolapse 06/27/2013   Chronic lower back pain 06/27/2013   Hypokalemia 06/27/2013   Hypertension 06/27/2013    Past Surgical History:  Procedure Laterality Date   COLONOSCOPY     GASTRIC BYPASS     JOINT REPLACEMENT N/A    [employer] 06/12/2020   POLYPECTOMY     TOTAL KNEE ARTHROPLASTY Left 07/30/2019   Procedure: LEFT TOTAL KNEE ARTHROPLASTY;  Surgeon: Leandrew Koyanagi, MD;  Location: [place];  Service: Orthopedics;  Laterality: Left;    OB History     Gravida  0   Para  0   Term  0   Preterm  0   AB  0   Living  0      SAB  0   IAB  0  Ectopic  0   Multiple  0   Live Births  0            Home Medications    Prior to Admission medications   Medication Sig Start Date End Date Taking? Authorizing Provider  acetic acid-hydrocortisone (VOSOL-HC) OTIC solution Place 3 drops into the right ear 2 (two) times daily. 07/23/21   [name], NP  albuterol (VENTOLIN HFA) 108 (90 Base) MCG/ACT inhaler Inhale 2 puffs into the lungs every 6 (six) hours as needed for wheezing or shortness of breath 05/06/21   [name], NP  atorvastatin (LIPITOR) 40 MG tablet Take 1 tablet (40 mg total) by mouth every other day. 05/06/21   [name], NP  busPIRone (BUSPAR) 10 MG tablet Take 1 tablet (10 mg total) by mouth 2 (two) times daily. 05/06/21   [name], NP  citalopram (CELEXA) 20 MG tablet Take  1.5 tablets (30 mg total) by mouth daily. 05/06/21   [name], NP  cyclobenzaprine (FLEXERIL) 10 MG tablet Take 10 mg by mouth at bedtime. 01/10/21   [provider]  dicyclomine (BENTYL) 20 MG tablet TAKE 1 TABLET (20 MG TOTAL) BY MOUTH 3 (THREE) TIMES DAILY BEFORE MEALS. TAKE ONLY AS NEEDED FOR ABDOMINAL PAIN. 12/24/20 12/24/21  Willia Craze, NP  gabapentin (NEURONTIN) 100 MG capsule Take 300 mg by mouth every other day.    [provider]  levothyroxine (SYNTHROID) 125 MCG tablet Take 1 tablet (125 mcg total) by mouth daily. 07/23/21   [name], NP  losartan (COZAAR) 25 MG tablet Take 1 tablet (25 mg total) by mouth daily. 05/06/21   [name], NP  lubiprostone (AMITIZA) 24 MCG capsule TAKE 1 CAPSULE (24 MCG TOTAL) BY MOUTH 2 (TWO) TIMES DAILY WITH A MEAL. 12/29/20 12/29/21  Willia Craze, NP  methocarbamol (ROBAXIN) 500 MG tablet Take 250 mg by mouth every 8 (eight) hours as needed for muscle spasms.    [provider]  ondansetron (ZOFRAN) 4 MG tablet Take 1 tablet (4 mg total) by mouth every 8 (eight) hours as needed for nausea or vomiting. 05/06/21   [name], NP  pantoprazole (PROTONIX) 40 MG tablet Take 1 tablet (40 mg total) by mouth daily. 05/06/21   [name], NP  Rimegepant Sulfate (NURTEC) 75 MG TBDP Take 75 mg by mouth as needed (take 1 at onset of headache, max is 1 tablet in 24 hours). 03/18/21   Suzzanne Cloud, NP  SUMAtriptan (IMITREX) 100 MG tablet TAKE 1 TABLET (100 MG TOTAL) BY MOUTH ONCE AS NEEDED FOR UP TO 1 DOSE FOR MIGRAINE. MAY REPEAT IN 2 HOURS IF HEADACHE PERSISTS OR RECURS. 12/16/20 12/16/21  Marcial Pacas, MD  tiZANidine (ZANAFLEX) 4 MG tablet TAKE 1 TABLET (4 MG TOTAL) BY MOUTH EVERY 6 (SIX) HOURS AS NEEDED. 12/16/20 12/16/21  Marcial Pacas, MD  topiramate (TOPAMAX) 25 MG tablet Take 1 tablet by mouth for 3 days at bedtime, then take 2 tablets for 3 days, then take 3 tablets at bedtime. 03/18/21   Suzzanne Cloud, NP  zolpidem  (AMBIEN) 5 MG tablet TAKE 1-2 TABLETS (5-10 MG TOTAL) BY MOUTH AT BEDTIME AS NEEDED FOR SLEEP. 10/03/20 04/01/21  [name], NP  linaclotide [facility]) 72 MCG capsule Take 1 capsule (72 mcg total) by mouth daily before breakfast. 12/26/20 12/29/20  Willia Craze, NP  nortriptyline (PAMELOR) 25 MG capsule Take 1-2 at bedtime if needed to sleep. 10/03/20 12/16/20  [name], NP  rizatriptan (MAXALT-MLT)  10 MG disintegrating tablet Take 1 tablet (10 mg total) by mouth as needed. May repeat in 2 hours if needed 10/03/20 12/16/20  [name], NP    Family History Family History  Problem Relation Age of Onset   Hypertension Mother    Gout Mother    Diabetes Mother    High Cholesterol Mother    Anxiety disorder Mother    Obesity Mother    Stroke Father    Stroke Sister    Pancreatic cancer Sister    Diabetes Maternal Aunt        x 2   Liver cancer Maternal Aunt    Colon cancer Neg Hx    Kidney disease Neg Hx    Colon polyps Neg Hx    Esophageal cancer Neg Hx    Rectal cancer Neg Hx    Stomach cancer Neg Hx     Social History Social History   Tobacco Use   Smoking status: Never   Smokeless tobacco: Never  Vaping Use   Vaping Use: Never used  Substance Use Topics   Alcohol use: Yes    Comment: occasional   Drug use: No     Allergies   Morphine and related   Review of Systems Review of Systems  Musculoskeletal:  Positive for arthralgias and joint swelling.  Skin:  Negative for color change and wound.  Neurological:  Positive for weakness (right wrist due to pain). Negative for numbness.    Physical Exam Triage Vital Signs ED Triage Vitals  Enc Vitals Group     BP 09/05/21 1206 136/84     Pulse Rate 09/05/21 1206 64     Resp 09/05/21 1206 18     Temp 09/05/21 1206 98.4 F (36.9 C)     Temp Source 09/05/21 1206 Oral     SpO2 09/05/21 1206 96 %     Weight --      Height --      Head Circumference --      Peak Flow --      Pain Score 09/05/21 1204 8      Pain Loc --      Pain Edu? --      Excl. in [place]? --    No data found.  Updated Vital Signs BP 136/84 (BP Location: Left Arm)   Pulse 64   Temp 98.4 F (36.9 C) (Oral)   Resp 18   LMP  (LMP Unknown) Comment: pt. states that it has been over 1 year  SpO2 96%   Visual Acuity Right Eye Distance:   Left Eye Distance:   Bilateral Distance:    Right Eye Near:   Left Eye Near:    Bilateral Near:     Physical Exam Vitals and nursing note reviewed.  Constitutional:      Appearance: Normal appearance. She is well-developed.  HENT:     Head: Normocephalic and atraumatic.  Cardiovascular:     Rate and Rhythm: Normal rate.  Pulmonary:     Effort: Pulmonary effort is normal.  Musculoskeletal:        General: Swelling and tenderness present. Normal range of motion.     Cervical back: Normal range of motion.     Comments: Right wrist: mild to moderate edema compared to Left. Diffuse tenderness, worse along ulnar aspect. Full ROM increased pain on end range. 4/5 grip strength compared to Left limited by pain. No tenderness to hand including no snuff box tenderness.  Right elbow: full ROM ,  non-tender.   Skin:    General: Skin is warm and dry.     Capillary Refill: Capillary refill takes less than 2 seconds.     Comments: Right wrist: skin in tact. No ecchymosis or erythema.  Neurological:     Mental Status: She is alert and oriented to person, place, and time.  Psychiatric:        Behavior: Behavior normal.     UC Treatments / Results  Labs (all labs ordered are listed, but only abnormal results are displayed) Labs Reviewed - No data to display  EKG   Radiology DG Wrist Complete Right  Result Date: 09/05/2021 CLINICAL DATA:  swelling, pain EXAM: RIGHT WRIST - COMPLETE 3+ VIEW COMPARISON:  November 24, 2017 FINDINGS: Osteopenia. No acute fracture or dislocation. Degenerative changes of the first [facility] and radiocarpal joint. No area of erosion or osseous destruction. No  unexpected radiopaque foreign body. Soft tissues are unremarkable. IMPRESSION: No acute fracture or dislocation. If persistent clinical concern for scaphoid fracture, recommend immobilization and follow-up radiographs in 2 weeks versus MRI. Electronically Signed   By: Valentino Saxon M.D.   On: 09/05/2021 12:27    Procedures Procedures (including critical care time)  Medications Ordered in UC Medications - No data to display  Initial Impression / Assessment and Plan / UC Course  I have reviewed the triage vital signs and the nursing notes.  Pertinent labs & imaging results that were available during my care of the patient were reviewed by me and considered in my medical decision making (see chart for details).     Reviewed imaging with pt  Due to continued pain, will place pt in removeable splint Encouraged f/u with [facility] in 1-2 weeks if pain persists.  Pt verbalized understanding and agreement with tx plan.  AVS provided.  Final Clinical Impressions(s) / UC Diagnoses   Final diagnoses:  Sprain of right wrist, initial encounter     Discharge Instructions       Please wear your wrist splint for 1-2 weeks to help with pain and swelling. You may remove briefly to wash hands and apply a cool compress 2-3 times daily. Try to keep hand elevated and limit overuse to allow wrist to rest.  Call to schedule follow up appointment with [facility] for further evaluation and treatment of pain if not improving in 1-2 weeks, sooner if pain or stiffness worsening.      ED Prescriptions   None    PDMP not reviewed this encounter.   Noe Gens, [place] 09/05/21 1251

## 2021-09-05 NOTE — ED Triage Notes (Signed)
Pt reports pain and swelling in right wrist x 1 week. States she fell over right wrist.

## 2021-09-05 NOTE — Discharge Instructions (Signed)
  Please wear your wrist splint for 1-2 weeks to help with pain and swelling. You may remove briefly to wash hands and apply a cool compress 2-3 times daily. Try to keep hand elevated and limit overuse to allow wrist to rest.  Call to schedule follow up appointment with [facility] for further evaluation and treatment of pain if not improving in 1-2 weeks, sooner if pain or stiffness worsening.

## 2021-09-08 ENCOUNTER — Telehealth: Payer: Self-pay | Admitting: *Deleted

## 2021-09-08 NOTE — Telephone Encounter (Signed)
PA for Nurtec 75mg  started on covermymeds (key: [id number]). Pt has coverage through [employer] [phone]). Approved through 09/07/2022.

## 2021-10-01 ENCOUNTER — Ambulatory Visit: Payer: 59 | Admitting: Neurology

## 2021-10-12 ENCOUNTER — Other Ambulatory Visit (HOSPITAL_COMMUNITY): Payer: Self-pay

## 2021-10-14 ENCOUNTER — Other Ambulatory Visit (HOSPITAL_COMMUNITY): Payer: Self-pay

## 2021-10-22 ENCOUNTER — Other Ambulatory Visit (HOSPITAL_COMMUNITY): Payer: Self-pay

## 2022-02-22 ENCOUNTER — Ambulatory Visit: Payer: Self-pay

## 2022-03-04 ENCOUNTER — Encounter: Payer: Self-pay | Admitting: Registered Nurse

## 2022-03-04 ENCOUNTER — Other Ambulatory Visit: Payer: Self-pay

## 2022-03-04 ENCOUNTER — Ambulatory Visit: Payer: 59 | Admitting: Registered Nurse

## 2022-03-04 ENCOUNTER — Encounter: Payer: Self-pay | Admitting: Physician Assistant

## 2022-03-04 VITALS — BP 130/66 | HR 87 | Temp 98.0°F | Resp 18 | Ht 65.0 in | Wt 222.8 lb

## 2022-03-04 DIAGNOSIS — E039 Hypothyroidism, unspecified: Secondary | ICD-10-CM | POA: Diagnosis not present

## 2022-03-04 DIAGNOSIS — K219 Gastro-esophageal reflux disease without esophagitis: Secondary | ICD-10-CM | POA: Diagnosis not present

## 2022-03-04 DIAGNOSIS — E7800 Pure hypercholesterolemia, unspecified: Secondary | ICD-10-CM

## 2022-03-04 DIAGNOSIS — Z6837 Body mass index (BMI) 37.0-37.9, adult: Secondary | ICD-10-CM

## 2022-03-04 DIAGNOSIS — Z021 Encounter for pre-employment examination: Secondary | ICD-10-CM

## 2022-03-04 DIAGNOSIS — I10 Essential (primary) hypertension: Secondary | ICD-10-CM | POA: Diagnosis not present

## 2022-03-04 DIAGNOSIS — F411 Generalized anxiety disorder: Secondary | ICD-10-CM

## 2022-03-04 LAB — CBC WITH DIFFERENTIAL/PLATELET
Basophils Absolute: 0 10*3/uL (ref 0.0–0.1)
Basophils Relative: 0.8 % (ref 0.0–3.0)
Eosinophils Absolute: 0.1 10*3/uL (ref 0.0–0.7)
Eosinophils Relative: 5.7 % — ABNORMAL HIGH (ref 0.0–5.0)
HCT: 33.2 % — ABNORMAL LOW (ref 36.0–46.0)
Hemoglobin: 10.3 g/dL — ABNORMAL LOW (ref 12.0–15.0)
Lymphocytes Relative: 42 % (ref 12.0–46.0)
Lymphs Abs: 1 10*3/uL (ref 0.7–4.0)
MCHC: 30.9 g/dL (ref 30.0–36.0)
MCV: 83.7 fl (ref 78.0–100.0)
Monocytes Absolute: 0.1 10*3/uL (ref 0.1–1.0)
Monocytes Relative: 6.1 % (ref 3.0–12.0)
Neutro Abs: 1 10*3/uL — ABNORMAL LOW (ref 1.4–7.7)
Neutrophils Relative %: 45.4 % (ref 43.0–77.0)
Platelets: 233 10*3/uL (ref 150.0–400.0)
RBC: 3.96 Mil/uL (ref 3.87–5.11)
RDW: 16 % — ABNORMAL HIGH (ref 11.5–15.5)
WBC: 2.3 10*3/uL — ABNORMAL LOW (ref 4.0–10.5)

## 2022-03-04 LAB — URINALYSIS, ROUTINE W REFLEX MICROSCOPIC
Bilirubin Urine: NEGATIVE
Hgb urine dipstick: NEGATIVE
Ketones, ur: NEGATIVE
Leukocytes,Ua: NEGATIVE
Nitrite: NEGATIVE
RBC / HPF: NONE SEEN (ref 0–?)
Specific Gravity, Urine: 1.01 (ref 1.000–1.030)
Total Protein, Urine: NEGATIVE
Urine Glucose: NEGATIVE
Urobilinogen, UA: 0.2 (ref 0.0–1.0)
pH: 6 (ref 5.0–8.0)

## 2022-03-04 LAB — LIPID PANEL
Cholesterol: 233 mg/dL — ABNORMAL HIGH (ref 0–200)
HDL: 94.6 mg/dL (ref >39.00)
LDL Cholesterol: 117 mg/dL — ABNORMAL HIGH (ref 0–99)
NonHDL: 138.17
Total CHOL/HDL Ratio: 2
Triglycerides: 104 mg/dL (ref 0.0–149.0)
VLDL: 20.8 mg/dL (ref 0.0–40.0)

## 2022-03-04 LAB — COMPREHENSIVE METABOLIC PANEL
ALT: 49 U/L — ABNORMAL HIGH (ref 0–35)
AST: 57 U/L — ABNORMAL HIGH (ref 0–37)
Albumin: 4.2 g/dL (ref 3.5–5.2)
Alkaline Phosphatase: 75 U/L (ref 39–117)
BUN: 11 mg/dL (ref 6–23)
CO2: 30 mEq/L (ref 19–32)
Calcium: 9 mg/dL (ref 8.4–10.5)
Chloride: 104 mEq/L (ref 96–112)
Creatinine, Ser: 0.98 mg/dL (ref 0.40–1.20)
GFR: 62.93 mL/min (ref >60.00)
Glucose, Bld: 79 mg/dL (ref 70–99)
Potassium: 3.8 mEq/L (ref 3.5–5.1)
Sodium: 139 mEq/L (ref 135–145)
Total Bilirubin: 0.4 mg/dL (ref 0.2–1.2)
Total Protein: 6.8 g/dL (ref 6.0–8.3)

## 2022-03-04 LAB — TSH: TSH: 13.2 u[IU]/mL — ABNORMAL HIGH (ref 0.35–5.50)

## 2022-03-04 LAB — HEMOGLOBIN A1C: Hgb A1c MFr Bld: 5.6 % (ref 4.6–6.5)

## 2022-03-04 LAB — T4, FREE: Free T4: 0.42 ng/dL — ABNORMAL LOW (ref 0.60–1.60)

## 2022-03-04 MED ORDER — CITALOPRAM HYDROBROMIDE 20 MG PO TABS
30.0000 mg | ORAL_TABLET | Freq: Every day | ORAL | 1 refills | Status: DC
Start: 2022-03-04 — End: 2022-10-15
  Filled 2022-03-04 – 2022-03-05 (×2): qty 135, 90d supply, fill #0
  Filled 2022-07-15 (×2): qty 135, 90d supply, fill #1
  Filled 2022-10-15: qty 135, 90d supply, fill #2

## 2022-03-04 MED ORDER — LEVOTHYROXINE SODIUM 125 MCG PO TABS
125.0000 ug | ORAL_TABLET | Freq: Every day | ORAL | 0 refills | Status: DC
  Filled 2022-03-04 – 2022-03-05 (×2): qty 90, 90d supply, fill #0

## 2022-03-04 MED ORDER — PHENTERMINE HCL 37.5 MG PO CAPS
37.5000 mg | ORAL_CAPSULE | ORAL | 0 refills | Status: DC
  Filled 2022-03-04: qty 90, 90d supply, fill #0

## 2022-03-04 MED ORDER — PANTOPRAZOLE SODIUM 40 MG PO TBEC
40.0000 mg | DELAYED_RELEASE_TABLET | Freq: Every day | ORAL | 3 refills | Status: DC
  Filled 2022-03-04 – 2022-03-05 (×2): qty 90, 90d supply, fill #0
  Filled 2022-07-15 (×2): qty 90, 90d supply, fill #1
  Filled 2022-09-21: qty 90, 90d supply, fill #2
  Filled 2022-12-30: qty 90, 90d supply, fill #3

## 2022-03-04 MED ORDER — LOSARTAN POTASSIUM 25 MG PO TABS
25.0000 mg | ORAL_TABLET | Freq: Every day | ORAL | 1 refills | Status: DC
  Filled 2022-03-04 – 2022-03-05 (×2): qty 90, 90d supply, fill #0
  Filled 2022-07-15 (×2): qty 90, 90d supply, fill #1

## 2022-03-04 MED ORDER — ATORVASTATIN CALCIUM 40 MG PO TABS
40.0000 mg | ORAL_TABLET | ORAL | 1 refills | Status: DC
  Filled 2022-03-04 – 2022-03-05 (×2): qty 45, 90d supply, fill #0
  Filled 2022-07-15 (×2): qty 45, 90d supply, fill #1
  Filled 2022-10-15: qty 45, 90d supply, fill #2
  Filled 2022-12-30: qty 45, 90d supply, fill #3

## 2022-03-04 MED ORDER — BUSPIRONE HCL 10 MG PO TABS
10.0000 mg | ORAL_TABLET | Freq: Two times a day (BID) | ORAL | 0 refills | Status: DC
  Filled 2022-03-04 – 2022-03-05 (×2): qty 180, 90d supply, fill #0

## 2022-03-04 NOTE — Assessment & Plan Note (Signed)
Well controlled on current regimen. Continue.  

## 2022-03-04 NOTE — Addendum Note (Signed)
Addended by: [name] on: 03/04/2022 09:04 AM ? ? Modules accepted: Orders ? ?

## 2022-03-04 NOTE — Assessment & Plan Note (Signed)
Well controlled on current regimen ?Continue. Refills given. ?

## 2022-03-04 NOTE — Assessment & Plan Note (Signed)
Well controlled with protonix. Continue use. Refill given.  ?

## 2022-03-04 NOTE — Patient Instructions (Addendum)
Julie Miranda -  ? ?Always great to see you! ? ?Let's check labs ?I'll call with any concerns. ? ?Let me know if you need anything ? ?See you in 3 mo ? ?Thanks, ? ?Rich  ? ? ? ?If you have lab work done today you will be contacted with your lab results within the next 2 weeks.  If you have not heard from [place] then please contact us. The fastest way to get your results is to register for My Chart. ? ? ?IF you received an x-ray today, you will receive an invoice from [facility]. Please contact [facility] at [phone] with questions or concerns regarding your invoice.  ? ?IF you received labwork today, you will receive an invoice from [facility]. Please contact [facility] at [phone] with questions or concerns regarding your invoice.  ? ?Our billing staff will not be able to assist you with questions regarding bills from these companies. ? ?You will be contacted with the lab results as soon as they are available. The fastest way to get your results is to activate your My Chart account. Instructions are located on the last page of this paperwork. If you have not heard from [place] regarding the results in 2 weeks, please contact this office. ?  ? ? ?

## 2022-03-04 NOTE — Assessment & Plan Note (Signed)
Start phentermine 37.'5mg'$  po qd. Reviewed risks, benefits, and side effects, pt voices understanding. ? ?

## 2022-03-04 NOTE — Assessment & Plan Note (Signed)
Labs collected. Will follow up with the patient as warranted. ?Refill statin ?

## 2022-03-04 NOTE — Progress Notes (Signed)
? ?Established Patient Office Visit ? ?Subjective:  ?Patient ID: Julie Miranda, female    DOB: Oct 22, 1961  Age: 61 y.o. MRN: [id number] ? ?CC:  ?Chief Complaint  ?Patient presents with  ? Weight Loss  ?  Patient states she is here for weight loss , ear problems and drug screen  ? ? ?HPI ?Julie Miranda presents for weight loss, ear issues, drug screening, med refill.  ? ?Ear pain ?Pressure, L ear. Pain below L ear.  ?Spread to R side. Both pressure and pain ?Has used OTC ear drops, had soothed for a while but no complete relief. ?No drainage. Some sinus congestion. Some muffled hearing. ? ?Weight ?Has pursued diet and exercise, intermittent fasting. ?Interested in medical support ?Has not used medication for weight loss in the past. ? ?Drug Screening  ?Needed for work ?Pre-employment  ?She has already done TB test.  ? ?Med Refill: ?Hypertension: ?Patient Currently taking: losartan '25mg'$  po qd ?Good effect. No AEs. ?Denies CV symptoms including: chest pain, shob, doe, headache, visual changes, fatigue, claudication, and dependent edema.  ? ?Previous readings and labs: ?BP Readings from Last 3 Encounters:  ?03/04/22 130/66  ?09/05/21 136/84  ?05/06/21 130/90  ? ?Lab Results  ?Component Value Date  ? CREATININE 0.85 05/06/2021  ? ? ?Hypothyroidism ?On synthroid 158mg po qd ?Good effect, no AE ?Notes stubborn weight, otherwise, no symptoms of thyroid dysfunction. ? ?GERD ?Protonix '40mg'$   ?Good effect, no AE. ? ?HLD ?Atorvastatin '40mg'$  po qd ?Good effect, no AE ?Lab Results  ?Component Value Date  ? CHOL 225 (H) 05/06/2021  ? HDL 75.00 05/06/2021  ? LDLCALC 131 (H) 05/06/2021  ? TRIG 92.0 05/06/2021  ? CHOLHDL 3 05/06/2021  ? ? ?Outpatient Medications Prior to Visit  ?Medication Sig Dispense Refill  ? acetic acid-hydrocortisone (VOSOL-HC) OTIC solution Place 3 drops into the right ear 2 (two) times daily. 10 mL 0  ? albuterol (VENTOLIN HFA) 108 (90 Base) MCG/ACT inhaler Inhale 2 puffs into the lungs every 6 (six) hours  as needed for wheezing or shortness of breath 8.5 g 0  ? cyclobenzaprine (FLEXERIL) 10 MG tablet Take 10 mg by mouth at bedtime.    ? gabapentin (NEURONTIN) 100 MG capsule Take 300 mg by mouth every other day.    ? methocarbamol (ROBAXIN) 500 MG tablet Take 250 mg by mouth every 8 (eight) hours as needed for muscle spasms.    ? ondansetron (ZOFRAN) 4 MG tablet Take 1 tablet (4 mg total) by mouth every 8 (eight) hours as needed for nausea or vomiting. 20 tablet 0  ? Rimegepant Sulfate (NURTEC) 75 MG TBDP Take 75 mg by mouth as needed (take 1 at onset of headache, max is 1 tablet in 24 hours). 12 tablet 11  ? topiramate (TOPAMAX) 25 MG tablet Take 1 tablet by mouth for 3 days at bedtime, then take 2 tablets for 3 days, then take 3 tablets at bedtime. 90 tablet 6  ? atorvastatin (LIPITOR) 40 MG tablet Take 1 tablet (40 mg total) by mouth every other day. 90 tablet 1  ? busPIRone (BUSPAR) 10 MG tablet Take 1 tablet (10 mg total) by mouth 2 (two) times daily. 180 tablet 0  ? citalopram (CELEXA) 20 MG tablet Take 1.5 tablets (30 mg total) by mouth daily. 145 tablet 1  ? levothyroxine (SYNTHROID) 125 MCG tablet Take 1 tablet (125 mcg total) by mouth daily. 90 tablet 0  ? losartan (COZAAR) 25 MG tablet Take 1 tablet (25 mg total)  by mouth daily. 90 tablet 1  ? pantoprazole (PROTONIX) 40 MG tablet Take 1 tablet (40 mg total) by mouth daily. 90 tablet 3  ? dicyclomine (BENTYL) 20 MG tablet TAKE 1 TABLET (20 MG TOTAL) BY MOUTH 3 (THREE) TIMES DAILY BEFORE MEALS. TAKE ONLY AS NEEDED FOR ABDOMINAL PAIN. 60 tablet 0  ? SUMAtriptan (IMITREX) 100 MG tablet TAKE 1 TABLET (100 MG TOTAL) BY MOUTH ONCE AS NEEDED FOR UP TO 1 DOSE FOR MIGRAINE. MAY REPEAT IN 2 HOURS IF HEADACHE PERSISTS OR RECURS. 12 tablet 11  ? zolpidem (AMBIEN) 5 MG tablet TAKE 1-2 TABLETS (5-10 MG TOTAL) BY MOUTH AT BEDTIME AS NEEDED FOR SLEEP. 30 tablet 0  ? ?No facility-administered medications prior to visit.  ? ? ?Review of Systems  ?Constitutional: Negative.    ?HENT: Negative.    ?Eyes: Negative.   ?Respiratory: Negative.    ?Cardiovascular: Negative.   ?Gastrointestinal: Negative.   ?Genitourinary: Negative.   ?Musculoskeletal: Negative.   ?Skin: Negative.   ?Neurological: Negative.   ?Psychiatric/Behavioral: Negative.    ?All other systems reviewed and are negative. ? ?  ?Objective:  ?  ? ?BP 130/66   Pulse 87   Temp 98 ?F (36.7 ?C) (Temporal)   Resp 18   Ht '5\' 5"'$  (1.651 m)   Wt 222 lb 12.8 oz (101.1 kg)   LMP  (LMP Unknown) Comment: pt. states that it has been over 1 year  SpO2 98%   BMI 37.08 kg/m?  ? ?Wt Readings from Last 3 Encounters:  ?03/04/22 222 lb 12.8 oz (101.1 kg)  ?05/06/21 208 lb (94.3 kg)  ?04/16/21 206 lb 3.2 oz (93.5 kg)  ? ?Physical Exam ?Vitals and nursing note reviewed.  ?Constitutional:   ?   General: She is not in acute distress. ?   Appearance: Normal appearance. She is normal weight. She is not ill-appearing, toxic-appearing or diaphoretic.  ?Cardiovascular:  ?   Rate and Rhythm: Normal rate and regular rhythm.  ?   Heart sounds: Normal heart sounds. No murmur heard. ?  No friction rub. No gallop.  ?Pulmonary:  ?   Effort: Pulmonary effort is normal. No respiratory distress.  ?   Breath sounds: Normal breath sounds. No stridor. No wheezing, rhonchi or rales.  ?Chest:  ?   Chest wall: No tenderness.  ?Skin: ?   General: Skin is warm and dry.  ?Neurological:  ?   General: No focal deficit present.  ?   Mental Status: She is alert and oriented to person, place, and time. Mental status is at baseline.  ?Psychiatric:     ?   Mood and Affect: Mood normal.     ?   Behavior: Behavior normal.     ?   Thought Content: Thought content normal.     ?   Judgment: Judgment normal.  ? ? ?No results found for any visits on 03/04/22. ? ? ? ?The 10-year ASCVD risk score (Arnett DK, et al., 2019) is: 6.6% ? ?  ?Assessment & Plan:  ? ?Problem List Items Addressed This Visit   ? ?  ? Cardiovascular and Mediastinum  ? Essential hypertension, benign  ?  Well  controlled on current regimen. Continue.  ? ?  ?  ? Relevant Medications  ? atorvastatin (LIPITOR) 40 MG tablet  ? losartan (COZAAR) 25 MG tablet  ?  ? Digestive  ? Gastroesophageal reflux disease  ?  Well controlled with protonix. Continue use. Refill given.  ? ?  ?  ?  Relevant Medications  ? pantoprazole (PROTONIX) 40 MG tablet  ?  ? Endocrine  ? Hypothyroidism  ?  Labs collected. Will follow up with the patient as warranted. ?Refills given ? ?  ?  ? Relevant Medications  ? levothyroxine (SYNTHROID) 125 MCG tablet  ?  ? Other  ? Generalized anxiety disorder  ?  Well controlled on current regimen ?Continue. Refills given. ? ?  ?  ? Relevant Medications  ? busPIRone (BUSPAR) 10 MG tablet  ? citalopram (CELEXA) 20 MG tablet  ? Pure hypercholesterolemia  ?  Labs collected. Will follow up with the patient as warranted. ?Refill statin ? ?  ?  ? Relevant Medications  ? atorvastatin (LIPITOR) 40 MG tablet  ? losartan (COZAAR) 25 MG tablet  ? Class 2 severe obesity due to excess calories with serious comorbidity and body mass index (BMI) of 37.0 to 37.9 in adult [facility]) - [facility]  ?  Start phentermine 37.'5mg'$  po qd. Reviewed risks, benefits, and side effects, pt voices understanding. ? ? ?  ?  ? Relevant Medications  ? phentermine 37.5 MG capsule  ? ? ?Meds ordered this encounter  ?Medications  ? phentermine 37.5 MG capsule  ?  Sig: Take 1 capsule (37.5 mg total) by mouth every morning.  ?  Dispense:  90 capsule  ?  Refill:  0  ?  Order Specific Question:   Supervising Provider  ?  Answer:   Carlota Raspberry, JEFFREY R [[id number]]  ? atorvastatin (LIPITOR) 40 MG tablet  ?  Sig: Take 1 tablet (40 mg total) by mouth every other day.  ?  Dispense:  90 tablet  ?  Refill:  1  ?  Order Specific Question:   Supervising Provider  ?  Answer:   Carlota Raspberry, JEFFREY R [[id number]]  ? busPIRone (BUSPAR) 10 MG tablet  ?  Sig: Take 1 tablet (10 mg total) by mouth 2 (two) times daily.  ?  Dispense:  180 tablet  ?  Refill:  0  ?  Order Specific Question:    Supervising Provider  ?  Answer:   Carlota Raspberry, JEFFREY R [[id number]]  ? citalopram (CELEXA) 20 MG tablet  ?  Sig: Take 1.5 tablets (30 mg total) by mouth daily.  ?  Dispense:  145 tablet  ?  Refill:  1  ?  Order Specific Question:

## 2022-03-04 NOTE — Assessment & Plan Note (Signed)
Labs collected. Will follow up with the patient as warranted. ?Refills given ?

## 2022-03-05 ENCOUNTER — Other Ambulatory Visit: Payer: Self-pay | Admitting: Registered Nurse

## 2022-03-05 ENCOUNTER — Other Ambulatory Visit (HOSPITAL_COMMUNITY): Payer: Self-pay

## 2022-03-05 ENCOUNTER — Other Ambulatory Visit: Payer: Self-pay

## 2022-03-05 DIAGNOSIS — E039 Hypothyroidism, unspecified: Secondary | ICD-10-CM

## 2022-03-05 MED ORDER — PHENTERMINE HCL 37.5 MG PO CAPS: 37.5000 mg | ORAL_CAPSULE | ORAL | 0 refills | Status: DC

## 2022-03-05 MED ORDER — LEVOTHYROXINE SODIUM 137 MCG PO TABS
137.0000 ug | ORAL_TABLET | Freq: Every day | ORAL | 0 refills | Status: DC
  Filled 2022-03-05: qty 90, 90d supply, fill #0

## 2022-03-05 NOTE — Telephone Encounter (Signed)
Please send phentermine into the [place] outpatient pharmacy attempted to send and was unable to  ?

## 2022-03-09 ENCOUNTER — Telehealth: Payer: Self-pay | Admitting: Registered Nurse

## 2022-03-09 ENCOUNTER — Other Ambulatory Visit (HOSPITAL_COMMUNITY): Payer: Self-pay

## 2022-03-09 DIAGNOSIS — F411 Generalized anxiety disorder: Secondary | ICD-10-CM

## 2022-03-09 LAB — DRUG SCREEN 764883 11+OXYCO+ALC+CRT-BUND
Amphetamines, Urine: NEGATIVE ng/mL
Barbiturate: NEGATIVE ng/mL
Cannabinoid Quant, Ur: NEGATIVE ng/mL
Cocaine (Metabolite): NEGATIVE ng/mL
Creatinine: 126.6 mg/dL (ref 20.0–300.0)
Ethanol: NEGATIVE %
Meperidine: NEGATIVE ng/mL
Methadone Screen, Urine: NEGATIVE ng/mL
OPIATE SCREEN URINE: NEGATIVE ng/mL
Oxycodone/Oxymorphone, Urine: NEGATIVE ng/mL
Phencyclidine: NEGATIVE ng/mL
Propoxyphene: NEGATIVE ng/mL
Tramadol: NEGATIVE ng/mL
pH, Urine: 5.5 (ref 4.5–8.9)

## 2022-03-09 LAB — BENZODIAZEPINES CONFIRM, URINE: Benzodiazepines: NEGATIVE ng/mL

## 2022-03-09 MED ORDER — CYCLOBENZAPRINE HCL 10 MG PO TABS
10.0000 mg | ORAL_TABLET | Freq: Every day | ORAL | 0 refills | Status: DC
Start: 2022-03-09 — End: 2023-06-02
  Filled 2022-03-09: qty 30, 30d supply, fill #0

## 2022-03-09 NOTE — Telephone Encounter (Signed)
Patient is requesting a refill of the following medications: ?Requested Prescriptions  ? ?Pending Prescriptions Disp Refills  ? cyclobenzaprine (FLEXERIL) 10 MG tablet 30 tablet 0  ?  Sig: Take 1 tablet (10 mg total) by mouth at bedtime.  ? ? ?Date of patient request: 03/09/22  ?Last office visit: 03/04/22 ?Date of last refill: 01/10/21 ?Last refill amount: 30 ? ? ?

## 2022-03-09 NOTE — Telephone Encounter (Signed)
Encourage patient to contact the pharmacy for refills or they can request refills through [facility] ? ?(Please schedule appointment if patient has not been seen in over a year) ? ? ? ?WHAT PHARMACY WOULD THEY LIKE THIS SENT TO: [facility] ? ?MEDICATION NAME & DOSE:cyclobenzaprine (FLEXERIL) 10 MG tablet, water pill med, sleep med, weight loss, and ear drops ? ?NOTES/COMMENTS FROM PATIENT: Pt. Didn't know the name of medications ? ? ? ? ? ?[place] office please notify patient: ?It takes 48-72 hours to process rx refill requests ?Ask patient to call pharmacy to ensure rx is ready before heading there.   ?

## 2022-03-10 ENCOUNTER — Other Ambulatory Visit (HOSPITAL_COMMUNITY): Payer: Self-pay

## 2022-03-11 ENCOUNTER — Other Ambulatory Visit: Payer: Self-pay

## 2022-03-11 ENCOUNTER — Other Ambulatory Visit (HOSPITAL_COMMUNITY): Payer: Self-pay

## 2022-03-11 MED ORDER — PHENTERMINE HCL 37.5 MG PO CAPS: 37.5000 mg | ORAL_CAPSULE | ORAL | 0 refills | Status: DC

## 2022-03-11 NOTE — Telephone Encounter (Signed)
Patient states she need her water pill and phentermine sent to [facility] long out patient. Patient states she has forgot the name of the water pill that You would know best.

## 2022-03-11 NOTE — Telephone Encounter (Signed)
phentermine 37.5 MG capsule and zolpidem (AMBIEN) 5 MG tablet is also what patient is requesting. States her water pill is Hydrochlorothiazide '25MG'$ , from looking at medication list it appears it hasnt been filled since 2021, to which patient states that Delfino Lovett was supposed to refill this when she came in due to the swelling in her hands and feet. Is aware that Delfino Lovett is currently out of the office until Monday but is asking if Phentermine and Ambien can be sent in by another provider as those are the two medications she is really concerned about.

## 2022-03-11 NOTE — Telephone Encounter (Signed)
Patient is calling in stating that her water pill and the phentermine 37.5 MG capsule were not called in. Asking if we can can call once it has been sent in.

## 2022-03-12 ENCOUNTER — Other Ambulatory Visit (HOSPITAL_COMMUNITY): Payer: Self-pay

## 2022-03-12 MED ORDER — PHENTERMINE HCL 37.5 MG PO CAPS
37.5000 mg | ORAL_CAPSULE | ORAL | 0 refills | Status: DC
  Filled 2022-03-12: qty 90, 90d supply, fill #0

## 2022-03-12 NOTE — Telephone Encounter (Signed)
Prescription for Phentermine resent to [facility].  Since Ambien is not on her list will defer to PCP when he returns.

## 2022-03-12 NOTE — Telephone Encounter (Signed)
Can you send the patient medication for Phentermine that's the only medication out of the rest that has been sent but it was to the incorrect pharmacy. It is suppose to go to [facility].

## 2022-03-12 NOTE — Addendum Note (Signed)
Addended by: Midge Minium on: 03/12/2022 03:16 PM   Modules accepted: Orders

## 2022-03-15 ENCOUNTER — Other Ambulatory Visit (HOSPITAL_COMMUNITY): Payer: Self-pay

## 2022-03-15 NOTE — Telephone Encounter (Signed)
I believe that since [facility] are in the same system, they can transfer the script from 1 pharmacy to another.

## 2022-03-15 NOTE — Telephone Encounter (Signed)
Patient is calling in stating that her preferred pharmacy is [facility]. Asking if this can be re-sent to correct pharmacy.

## 2022-03-15 NOTE — Telephone Encounter (Signed)
Patient states now her preferred pharmacy is [facility] for the phentermine can this be re sent please.

## 2022-03-18 ENCOUNTER — Other Ambulatory Visit (HOSPITAL_COMMUNITY): Payer: Self-pay

## 2022-03-18 ENCOUNTER — Other Ambulatory Visit: Payer: Self-pay | Admitting: Registered Nurse

## 2022-03-18 MED ORDER — PHENTERMINE HCL 37.5 MG PO CAPS
37.5000 mg | ORAL_CAPSULE | ORAL | 0 refills | Status: DC
  Filled 2022-03-18 – 2022-07-15 (×2): qty 90, 90d supply, fill #0

## 2022-03-18 NOTE — Telephone Encounter (Signed)
Sent ? ?Thanks, ? ?Rich

## 2022-03-18 NOTE — Telephone Encounter (Signed)
Can you send the phentermine to [facility] for this patient?

## 2022-03-23 ENCOUNTER — Other Ambulatory Visit (HOSPITAL_COMMUNITY): Payer: Self-pay

## 2022-03-23 ENCOUNTER — Other Ambulatory Visit: Payer: Self-pay | Admitting: Registered Nurse

## 2022-03-23 ENCOUNTER — Telehealth: Payer: Self-pay | Admitting: Registered Nurse

## 2022-03-23 DIAGNOSIS — H6690 Otitis media, unspecified, unspecified ear: Secondary | ICD-10-CM

## 2022-03-23 MED ORDER — AMOXICILLIN-POT CLAVULANATE 875-125 MG PO TABS
1.0000 | ORAL_TABLET | Freq: Two times a day (BID) | ORAL | 0 refills | Status: DC
Start: 2022-03-23 — End: 2023-02-04
  Filled 2022-03-23: qty 20, 10d supply, fill #0

## 2022-03-23 MED ORDER — AZELASTINE HCL 0.1 % NA SOLN
1.0000 | Freq: Two times a day (BID) | NASAL | 12 refills | Status: DC
  Filled 2022-03-23: qty 30, 50d supply, fill #0
  Filled 2022-07-15 (×2): qty 30, 50d supply, fill #1
  Filled 2022-09-21: qty 30, 50d supply, fill #2
  Filled 2022-12-30: qty 30, 50d supply, fill #3
  Filled 2023-03-09: qty 30, 50d supply, fill #4

## 2022-03-23 NOTE — Telephone Encounter (Signed)
Have sent 2 of the 3  Unsure on fluid pill as I don't have her on one - was she needing one for swelling?  Thanks,  Denice Paradise

## 2022-03-23 NOTE — Telephone Encounter (Signed)
Pt stating that she still missing three of her medication. Missing a nasal spray, water pills and antibiotic for her both ears. Pt  doesn't know the name of the medications.

## 2022-03-24 ENCOUNTER — Telehealth: Payer: Self-pay | Admitting: Registered Nurse

## 2022-03-24 NOTE — Telephone Encounter (Signed)
PT stating her fingers and ankles are swelling. Pt is requesting water pills.

## 2022-03-24 NOTE — Telephone Encounter (Signed)
Attempted to call patient but her voicemail has not been set up yet.

## 2022-03-24 NOTE — Telephone Encounter (Signed)
New medications do require visits to evaluate, please schedule

## 2022-03-25 ENCOUNTER — Other Ambulatory Visit: Payer: Self-pay | Admitting: Registered Nurse

## 2022-03-25 ENCOUNTER — Other Ambulatory Visit (HOSPITAL_COMMUNITY): Payer: Self-pay

## 2022-03-25 DIAGNOSIS — I10 Essential (primary) hypertension: Secondary | ICD-10-CM

## 2022-03-25 MED ORDER — HYDROCHLOROTHIAZIDE 12.5 MG PO TABS
12.5000 mg | ORAL_TABLET | Freq: Every day | ORAL | 3 refills | Status: DC
  Filled 2022-03-25: qty 90, 90d supply, fill #0
  Filled 2022-07-15 (×2): qty 90, 90d supply, fill #1
  Filled 2022-10-15: qty 90, 90d supply, fill #2
  Filled 2022-12-30: qty 90, 90d supply, fill #3

## 2022-03-25 NOTE — Telephone Encounter (Signed)
Sent ? ?Thanks, ? ?Rich

## 2022-03-25 NOTE — Telephone Encounter (Signed)
I know I forward you a message regarding this patient about 3 medications. You have sent two of the 3 but was unsure of the water pill the patient was referring to. Patient was made aware of that and has no clue of the name either but is having some fingers and ankle swelling.

## 2022-03-31 ENCOUNTER — Other Ambulatory Visit (HOSPITAL_COMMUNITY): Payer: Self-pay

## 2022-04-15 ENCOUNTER — Encounter: Payer: Self-pay | Admitting: Physician Assistant

## 2022-04-15 ENCOUNTER — Other Ambulatory Visit (HOSPITAL_COMMUNITY): Payer: Self-pay

## 2022-04-15 ENCOUNTER — Encounter: Payer: Self-pay | Admitting: Neurology

## 2022-04-15 ENCOUNTER — Ambulatory Visit: Payer: 59 | Admitting: Neurology

## 2022-04-15 VITALS — BP 124/83 | HR 86 | Ht 65.0 in | Wt 219.5 lb

## 2022-04-15 DIAGNOSIS — R110 Nausea: Secondary | ICD-10-CM | POA: Diagnosis not present

## 2022-04-15 DIAGNOSIS — E611 Iron deficiency: Secondary | ICD-10-CM | POA: Diagnosis not present

## 2022-04-15 DIAGNOSIS — G43709 Chronic migraine without aura, not intractable, without status migrainosus: Secondary | ICD-10-CM | POA: Diagnosis not present

## 2022-04-15 MED ORDER — ONDANSETRON HCL 4 MG PO TABS
4.0000 mg | ORAL_TABLET | Freq: Three times a day (TID) | ORAL | 11 refills | Status: DC | PRN
  Filled 2022-04-15: qty 20, 7d supply, fill #0
  Filled 2022-09-21: qty 20, 7d supply, fill #1
  Filled 2022-10-15: qty 20, 7d supply, fill #2

## 2022-04-15 MED ORDER — TOPIRAMATE 100 MG PO TABS
100.0000 mg | ORAL_TABLET | Freq: Two times a day (BID) | ORAL | 11 refills | Status: DC
  Filled 2022-04-15: qty 60, 30d supply, fill #0
  Filled 2022-07-15 (×2): qty 60, 30d supply, fill #1
  Filled 2022-09-21: qty 60, 30d supply, fill #2
  Filled 2022-10-15: qty 60, 30d supply, fill #3

## 2022-04-15 MED ORDER — TIZANIDINE HCL 4 MG PO TABS
4.0000 mg | ORAL_TABLET | Freq: Four times a day (QID) | ORAL | 6 refills | Status: DC | PRN
Start: 2022-04-15 — End: 2024-04-26
  Filled 2022-04-15: qty 30, 8d supply, fill #0
  Filled 2022-07-15 (×2): qty 30, 8d supply, fill #1
  Filled 2022-09-21: qty 30, 8d supply, fill #2
  Filled 2022-10-15: qty 30, 8d supply, fill #3
  Filled 2022-12-30: qty 30, 8d supply, fill #4
  Filled 2023-04-03: qty 30, 8d supply, fill #5

## 2022-04-15 MED ORDER — NURTEC 75 MG PO TBDP
ORAL_TABLET | ORAL | 11 refills | Status: DC
Start: 2022-04-15 — End: 2022-06-21
  Filled 2022-04-15: qty 12, 30d supply, fill #0

## 2022-04-15 MED ORDER — AIMOVIG 70 MG/ML ~~LOC~~ SOAJ
70.0000 mg | SUBCUTANEOUS | 11 refills | Status: DC
  Filled 2022-04-15 – 2022-04-30 (×2): qty 1, 30d supply, fill #0
  Filled 2022-06-08: qty 1, 30d supply, fill #1

## 2022-04-15 NOTE — Patient Instructions (Signed)
Meds ordered this encounter  Preventive medications:    Erenumab-aooe (AIMOVIG) 70 MG/ML SOAJ   Sig: Inject 70 mg into the skin every 30 (thirty) days.   Dispense:  1.12 mL   Refill:  11  topiramate (TOPAMAX) 100 MG tablet   Sig: Take 1 tablet (100 mg total) by mouth 2 (two) times daily.   Dispense:  60 tablet   Refill:  11       Rimegepant Sulfate (NURTEC) 75 MG TBDP   Sig: Take 1 tab at onset of migraine.  May repeat in 2 hrs, if needed.  Max dose: 2 tabs/day. This is a 30 day prescription.   Dispense:  12 tablet   Refill:  11   ondansetron (ZOFRAN) 4 MG tablet   Sig: Take 1 tablet (4 mg total) by mouth every 8 (eight) hours as needed for nausea or vomiting.   Dispense:  20 tablet   Refill:  11   tiZANidine (ZANAFLEX) 4 MG tablet   Sig: Take 1 tablet (4 mg total) by mouth every 6 (six) hours as needed for muscle spasms.   Dispense:  30 tablet   Refill:  6

## 2022-04-15 NOTE — Progress Notes (Signed)
HISTORICAL  Julie Miranda is a 61 year old female, seen in request by her primary care physician nurse practitioner [name], for evaluation of chronic migraine, initial evaluation was April 30, 2020.  She works as a Psychologist, counselling for [employer]  I reviewed and summarized the referring note. She had a history of hypothyroidism, apparently noncompliant with her medications, multiple elevated TSH level, "sometimes I run out of the medication"  She reported lifelong history of migraine headaches, lateralized severe pounding headache with associated light noise sensitivity, nauseous, is usually stay on the right side, occasionally on the left side,  Over the years, she reported gradual worsening migraine headaches, half time of the month, she has to deal with different degree of migraine headaches, she tends to take 2 tablets of Excedrin Migraine, twice a day to help her migraine," sometimes I sleep with migraine, wake up with migraine  She was giving nortriptyline 25 mg as preventive medication, which did help her her sleep better, but she ran out of the medicine, has stopped taking it, not sure about the benefit for migraine  Laboratory evaluation showed normal CBC, CMP, TSH, was significantly elevated at 16.7,  UPDATE Dec 16 2020: She is accompanied by her mother Berenice Primas at today's clinical visit, she complains of worsening stress, depression anxiety, headaches, is taking Celexa 20 mg daily, however never tried take nortriptyline regularly, she described nortriptyline did help her go to sleep, but feeling tired after waking up,  She was recently defined to have significant iron deficiency anemia, ferritin level was only 4, hemoglobin of 8.1, receiving iron infusion, which has helped her symptoms, is going through GI evaluation for potential GI loss.  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     amoxicillin-clavulanate (AUGMENTIN) 875-125 MG tablet Take 1 tablet by mouth 2 (two) times daily. 20 tablet 0   atorvastatin (LIPITOR) 40 MG tablet Take 1 tablet (40 mg total) by mouth every other day. 90 tablet 1   azelastine (ASTELIN) 0.1 % nasal spray Place 1 spray into both nostrils 2 (two) times daily as directed 30 mL 12   busPIRone (BUSPAR) 10 MG tablet Take 1 tablet (10 mg total) by mouth 2 (two) times daily. 180 tablet 0   citalopram (CELEXA) 20 MG tablet Take 1.5 tablets (30 mg total) by mouth daily. 145 tablet 1   cyclobenzaprine (FLEXERIL) 10 MG tablet Take 1 tablet (10 mg total) by mouth at bedtime. 30 tablet 0   gabapentin (NEURONTIN) 100 MG capsule Take 300 mg by mouth every other day.     hydrochlorothiazide (HYDRODIURIL)  12.5 MG tablet Take 1 tablet (12.5 mg total) by mouth daily. 90 tablet 3   levothyroxine (SYNTHROID) 137 MCG tablet Take 1 tablet (137 mcg total) by mouth daily before breakfast. 90 tablet 0   losartan (COZAAR) 25 MG tablet Take 1 tablet (25 mg total) by mouth daily. 90 tablet 1    methocarbamol (ROBAXIN) 500 MG tablet Take 250 mg by mouth every 8 (eight) hours as needed for muscle spasms.     ondansetron (ZOFRAN) 4 MG tablet Take 1 tablet (4 mg total) by mouth every 8 (eight) hours as needed for nausea or vomiting. 20 tablet 0   pantoprazole (PROTONIX) 40 MG tablet Take 1 tablet (40 mg total) by mouth daily. 90 tablet 3   phentermine 37.5 MG capsule Take 1 capsule (37.5 mg total) by mouth every morning. 90 capsule 0   dicyclomine (BENTYL) 20 MG tablet TAKE 1 TABLET (20 MG TOTAL) BY MOUTH 3 (THREE) TIMES DAILY BEFORE MEALS. TAKE ONLY AS NEEDED FOR ABDOMINAL PAIN. 60 tablet 0   SUMAtriptan (IMITREX) 100 MG tablet TAKE 1 TABLET (100 MG TOTAL) BY MOUTH ONCE AS NEEDED FOR UP TO 1 DOSE FOR MIGRAINE. MAY REPEAT IN 2 HOURS IF HEADACHE PERSISTS OR RECURS. 12 tablet 11   No current facility-administered medications for this visit.    PAST MEDICAL HISTORY: Past Medical History:  Diagnosis Date   Anemia    Anxiety    Arthritis    Colon polyps    x 3   Depression    Edema of both lower extremities    Family history of adverse reaction to anesthesia    makes mother" loopy"   Gastric ulcer    GERD (gastroesophageal reflux disease)    Graves disease    History of pancytopenia    of unknown etiology    Hyperlipidemia    Hypertension    Hypothyroidism    IBS (irritable bowel syndrome)    Migraine    Osteoarthritis    Thyroid disease    Vitamin D deficiency     PAST SURGICAL HISTORY: Past Surgical History:  Procedure Laterality Date   COLONOSCOPY     GASTRIC BYPASS     JOINT REPLACEMENT N/A    [employer] 06/12/2020   POLYPECTOMY     TOTAL KNEE ARTHROPLASTY Left 07/30/2019   Procedure: LEFT TOTAL KNEE ARTHROPLASTY;  Surgeon: Leandrew Koyanagi, MD;  Location: [facility];  Service: Orthopedics;  Laterality: Left;    FAMILY HISTORY: Family History  Problem Relation Age of Onset   Hypertension Mother    Gout Mother    Diabetes Mother    High Cholesterol Mother     Anxiety disorder Mother    Obesity Mother    Stroke Father    Stroke Sister    Pancreatic cancer Sister    Diabetes Maternal Aunt        x 2   Liver cancer Maternal Aunt    Colon cancer Neg Hx    Kidney disease Neg Hx    Colon polyps Neg Hx    Esophageal cancer Neg Hx    Rectal cancer Neg Hx    Stomach cancer Neg Hx     SOCIAL HISTORY: Social History   Socioeconomic History   Marital status: Single    Spouse name: Not on file   Number of children: 0   Years of education: some college   Highest education level: Not on file  Occupational History   Occupation: Chartered certified accountant  Tobacco Use  Smoking status: Never   Smokeless tobacco: Never  Vaping Use   Vaping Use: Never used  Substance and Sexual Activity   Alcohol use: Yes    Comment: occasional   Drug use: No   Sexual activity: Not Currently    Birth control/protection: Injection  Other Topics Concern   Not on file  Social History Narrative   Lives at home with her mother.   Right-handed.   One cup caffeine per day.   Social Determinants of Health   Financial Resource Strain: Not on file  Food Insecurity: Not on file  Transportation Needs: Not on file  Physical Activity: Not on file  Stress: Not on file  Social Connections: Not on file  Intimate Partner Violence: Not on file   PHYSICAL EXAM   Vitals:   04/15/22 0741  BP: 124/83  Pulse: 86  Weight: 219 lb 8 oz (99.6 kg)  Height: '5\' 5"'$  (1.651 m)   Not recorded    Body mass index is 36.53 kg/m.  Physical Exam  General: The patient is alert and cooperative at the time of the examination, anxious appearing, fidgety.  Skin: No significant peripheral edema is noted.  Neurologic Exam  Mental status: The patient is alert and oriented x 3 at the time of the examination. The patient has apparent normal recent and remote memory, with an apparently normal attention span and concentration ability.  Cranial nerves: Facial symmetry is present. Speech is  normal, no aphasia or dysarthria is noted. Extraocular movements are full. Visual fields are full.  Motor: The patient has good strength in all 4 extremities.  Sensory examination: Soft touch sensation is symmetric on the face, arms, and legs.  Coordination: The patient has good finger-nose-finger and heel-to-shin bilaterally.  Gait and station: Gait is wide-based, mildly antalgic  Reflexes: Deep tendon reflexes are symmetric.  DIAGNOSTIC DATA (LABS, IMAGING, TESTING) - I reviewed patient records, labs, notes, testing and imaging myself where available.   ASSESSMENT AND PLAN  ZSOFIA PROUT is a 61 y.o. female   Chronic migraine headaches  Start Aimovig 70 mg every month,  Previously tried nortriptyline, Effexor as preventive medication with no help, complains of side effect,  Restart Topamax titrating to 100 mg twice a day  Suboptimal response to Imitrex, Maxalt,  Will try Nurtec 75 mg as needed, may combine with Aleve, Zofran, tizanidine for severe prolonged headaches,  Marcial Pacas, M.D. Ph.D.  [facility] [place], [place] [address] Phone: [phone] Fax:      [phone]

## 2022-04-19 ENCOUNTER — Telehealth: Payer: Self-pay | Admitting: *Deleted

## 2022-04-19 NOTE — Telephone Encounter (Signed)
PA for Aimovig 70mg  started on covermymeds (key: [email or web address]). Pharmacy coverage through [employer] [phone]). GMW#[id number] approved through 10/17/2022.

## 2022-04-30 ENCOUNTER — Encounter: Payer: Self-pay | Admitting: Physician Assistant

## 2022-04-30 ENCOUNTER — Other Ambulatory Visit (HOSPITAL_COMMUNITY): Payer: Self-pay

## 2022-06-01 ENCOUNTER — Telehealth: Payer: Self-pay | Admitting: Neurology

## 2022-06-01 NOTE — Telephone Encounter (Signed)
I called pt to discuss. Pt was not available and vm not accepting new messages. Will have to call back.

## 2022-06-01 NOTE — Telephone Encounter (Signed)
Pt called me back and we discussed. She reports ongoing migraine for the last 3 days. Reports last aimovig injection was July 3rd and next injection will be given soon. She has been taking nurtec as needed but no relief.  She and I discussed trying a methylprednisolone and she has not tried the for migraine relief before but has tried in the past for separate issues and tolerated well. Will discuss with work in since Dr. Krista Blue is out of the clinic this week.

## 2022-06-01 NOTE — Telephone Encounter (Signed)
Pt would like a call from the nurse to discuss having a migraine for 3 days. Nurtec is working.

## 2022-06-02 ENCOUNTER — Encounter (INDEPENDENT_AMBULATORY_CARE_PROVIDER_SITE_OTHER): Payer: Self-pay

## 2022-06-03 ENCOUNTER — Other Ambulatory Visit (HOSPITAL_COMMUNITY): Payer: Self-pay

## 2022-06-03 MED ORDER — METHYLPREDNISOLONE 4 MG PO TBPK
ORAL_TABLET | ORAL | 0 refills | Status: DC
  Filled 2022-06-03: qty 21, 6d supply, fill #0

## 2022-06-03 NOTE — Telephone Encounter (Signed)
Pt would like a call from the nurse

## 2022-06-03 NOTE — Telephone Encounter (Signed)
Per VO from Dr. Leonie Man, ok to rx dose pack to pt.   I called relayed information and we discussed med. Pt advised to take with food.  She will let us know if this does not help.

## 2022-06-03 NOTE — Telephone Encounter (Signed)
I have reached out to work in doc again on this call.

## 2022-06-03 NOTE — Addendum Note (Signed)
Addended by: Verlin Grills on: 06/03/2022 01:00 PM   Modules accepted: Orders

## 2022-06-08 ENCOUNTER — Ambulatory Visit: Payer: 59 | Admitting: Registered Nurse

## 2022-06-08 ENCOUNTER — Other Ambulatory Visit (HOSPITAL_COMMUNITY): Payer: Self-pay

## 2022-06-08 ENCOUNTER — Other Ambulatory Visit: Payer: Self-pay

## 2022-06-16 ENCOUNTER — Telehealth: Payer: Self-pay | Admitting: Neurology

## 2022-06-16 NOTE — Telephone Encounter (Signed)
Pt called wanting to know if another prevent medication can be prescribed to her so that she can take it along with her Inj. Please advise.

## 2022-06-17 NOTE — Telephone Encounter (Signed)
Chronic migraine headaches, already on polypharmacy treatment, please put her on next available schedule for Sarah for discuss of medications

## 2022-06-17 NOTE — Telephone Encounter (Signed)
I called the pt and we discussed message.   Pt reports migraines have not improved. Over the last 2 weeks daily migraines and no benefit from medications.   She is consistently taking Aimovig, Topamax, and rescue med. She tried methylprednisolone as recommended by work in on 06/03/2022 without benefit.   She is asking if an additional medication should be added to her preventive regimen?  Will fwd to provider to review.

## 2022-06-17 NOTE — Telephone Encounter (Signed)
Pt scheduled for 06/21/2022 at 345 with SS, NP.

## 2022-06-20 NOTE — Progress Notes (Unsigned)
Patient: Julie Miranda Date of Birth: 1961-05-21  Reason for Visit: Follow up History from: Patient Primary Neurologist: Dr. Krista Blue   ASSESSMENT AND PLAN 61 y.o. year old female   1.  Chronic migraine headache -Start Qulipta 60 mg daily for migraine preventative, will stop Aimovig, didn't see any benefit but just did 2nd injection; hopefully Lenoria Chime will provide faster improvement -I will check CMP before Qulipta  -Try Relpax 20 mg for acute headache -Continue Topamax 100 mg twice a day -Discussed continue work with PCP for better management of anxiety, a lot of work/financial stress likely contributing to headaches -Previously tried and failed: Aimovig, Topamax, Nurtec, nortriptyline, Effexor, Maxalt, Imitrex -Follow-up in 4 months with me, encouraged to reach out via MyChart if needed  HISTORY  Julie Miranda is a 61 year old female, seen in request by her primary care physician nurse practitioner [name], for evaluation of chronic migraine, initial evaluation was April 30, 2020.  She works as a Psychologist, counselling for [employer]   I reviewed and summarized the referring note. She had a history of hypothyroidism, apparently noncompliant with her medications, multiple elevated TSH level, "sometimes I run out of the medication"   She reported lifelong history of migraine headaches, lateralized severe pounding headache with associated light noise sensitivity, nauseous, is usually stay on the right side, occasionally on the left side,   Over the years, she reported gradual worsening migraine headaches, half time of the month, she has to deal with different degree of migraine headaches, she tends to take 2 tablets of Excedrin Migraine, twice a day to help her migraine," sometimes I sleep with migraine, wake up with migraine   She was giving nortriptyline 25 mg as preventive medication, which did help her her sleep better, but she ran out of the medicine, has stopped  taking it, not sure about the benefit for migraine   Laboratory evaluation showed normal CBC, CMP, TSH, was significantly elevated at 16.7,   UPDATE Dec 16 2020: She is accompanied by her mother Julie Miranda at today's clinical visit, she complains of worsening stress, depression anxiety, headaches, is taking Celexa 20 mg daily, however never tried take nortriptyline regularly, she described nortriptyline did help her go to sleep, but feeling tired after waking up,   She was recently defined to have significant iron deficiency anemia, ferritin level was only 4, hemoglobin of 8.1, receiving iron infusion, which has helped her symptoms, is going through GI evaluation for potential GI loss.   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 Krista Blue 04/15/22, ordered Aimovig 70 mg, this is her 2nd month, she just did it. Is a behavorial health tech, hasn't missed any work, works overtime, a lot of personal stress. Daily headache. 85% are significant headaches. Pounding headaches, lights are bothersome. Nurtec doesn't help at all. On topamax 100 mg twice daily.  Stopped taking Excedrin migraine. No more OTC medications. Doesn't sleep well, has insomnia.   REVIEW OF SYSTEMS: Out of a complete 14 system review of symptoms, the patient  complains only of the following symptoms, and all other reviewed systems are negative.  See HPI  ALLERGIES: Allergies  Allergen Reactions   Morphine And Related Nausea And Vomiting    HOME MEDICATIONS: Outpatient Medications Prior to Visit  Medication Sig Dispense Refill   AMOXICILLIN PO Take by mouth.     amoxicillin-clavulanate (AUGMENTIN) 875-125 MG tablet Take 1 tablet by mouth 2 (two) times daily. 20 tablet 0   atorvastatin (LIPITOR) 40 MG tablet Take 1 tablet (40 mg total) by mouth every other day. 90 tablet 1   azelastine (ASTELIN) 0.1 % nasal spray Place 1 spray into both nostrils 2 (two) times daily as directed 30 mL 12   busPIRone (BUSPAR) 10 MG tablet Take 1 tablet (10 mg total) by mouth 2 (two) times daily. 180 tablet 0   citalopram (CELEXA) 20 MG tablet Take 1.5 tablets (30 mg total) by mouth daily. 145 tablet 1   cyclobenzaprine (FLEXERIL) 10 MG tablet Take 1 tablet (10 mg total) by mouth at bedtime. 30 tablet 0   dicyclomine (BENTYL) 20 MG tablet TAKE 1 TABLET (20 MG TOTAL) BY MOUTH 3 (THREE) TIMES DAILY BEFORE MEALS. TAKE ONLY AS NEEDED FOR ABDOMINAL PAIN. 60 tablet 0   gabapentin (NEURONTIN) 100 MG capsule Take 300 mg by mouth every other day.     hydrochlorothiazide (HYDRODIURIL) 12.5 MG tablet Take 1 tablet (12.5 mg total) by mouth daily. 90 tablet 3   levothyroxine (SYNTHROID) 137 MCG tablet Take 1 tablet (137 mcg total) by mouth daily before breakfast. 90 tablet 0   losartan (COZAAR) 25 MG tablet Take 1 tablet (25 mg total) by mouth daily. 90 tablet 1   methocarbamol (ROBAXIN) 500 MG tablet Take 250 mg by mouth every 8 (eight) hours as needed for muscle spasms.     methylPREDNISolone (MEDROL DOSEPAK) 4 MG TBPK tablet Take as directed 21 tablet 0   ondansetron (ZOFRAN) 4 MG tablet Take 1 tablet (4 mg total) by mouth every 8 (eight) hours as needed for nausea or vomiting. 20 tablet 11   pantoprazole (PROTONIX) 40 MG tablet Take 1 tablet (40 mg total) by mouth daily. 90  tablet 3   phentermine 37.5 MG capsule Take 1 capsule (37.5 mg total) by mouth every morning. 90 capsule 0   tiZANidine (ZANAFLEX) 4 MG tablet Take 1 tablet (4 mg total) by mouth every 6 (six) hours as needed for muscle spasms. 30 tablet 6   topiramate (TOPAMAX) 100 MG tablet Take 1 tablet (100 mg total) by mouth 2 (two) times daily. 60 tablet 11   Erenumab-aooe (AIMOVIG) 70 MG/ML SOAJ Inject 70 mg into the skin every 30 (thirty) days. 1 mL 11   Rimegepant Sulfate (NURTEC) 75 MG TBDP Take 1 tab at onset of migraine.  May repeat in 2 hrs, if needed.  Max dose: 2 tabs/day. This is a 30 day prescription. 12 tablet 11   SUMAtriptan (IMITREX) 100 MG tablet TAKE 1 TABLET (100 MG TOTAL) BY MOUTH ONCE AS NEEDED FOR UP TO 1 DOSE FOR MIGRAINE. MAY REPEAT IN 2 HOURS IF HEADACHE PERSISTS OR RECURS. 12 tablet 11   No facility-administered medications prior to visit.    PAST MEDICAL HISTORY: Past Medical History:  Diagnosis Date   Anemia    Anxiety    Arthritis    Colon polyps  x 3   Depression    Edema of both lower extremities    Family history of adverse reaction to anesthesia    makes mother" loopy"   Gastric ulcer    GERD (gastroesophageal reflux disease)    Graves disease    History of pancytopenia    of unknown etiology    Hyperlipidemia    Hypertension    Hypothyroidism    IBS (irritable bowel syndrome)    Migraine    Osteoarthritis    Thyroid disease    Vitamin D deficiency     PAST SURGICAL HISTORY: Past Surgical History:  Procedure Laterality Date   COLONOSCOPY     GASTRIC BYPASS     JOINT REPLACEMENT N/A    [employer] 06/12/2020   POLYPECTOMY     TOTAL KNEE ARTHROPLASTY Left 07/30/2019   Procedure: LEFT TOTAL KNEE ARTHROPLASTY;  Surgeon: Leandrew Koyanagi, MD;  Location: [facility];  Service: Orthopedics;  Laterality: Left;    FAMILY HISTORY: Family History  Problem Relation Age of Onset   Hypertension Mother    Gout Mother    Diabetes Mother    High Cholesterol Mother     Anxiety disorder Mother    Obesity Mother    Stroke Father    Stroke Sister    Pancreatic cancer Sister    Diabetes Maternal Aunt        x 2   Liver cancer Maternal Aunt    Colon cancer Neg Hx    Kidney disease Neg Hx    Colon polyps Neg Hx    Esophageal cancer Neg Hx    Rectal cancer Neg Hx    Stomach cancer Neg Hx     SOCIAL HISTORY: Social History   Socioeconomic History   Marital status: Single    Spouse name: Not on file   Number of children: 0   Years of education: some college   Highest education level: Not on file  Occupational History   Occupation: Chartered certified accountant  Tobacco Use   Smoking status: Never   Smokeless tobacco: Never  Vaping Use   Vaping Use: Never used  Substance and Sexual Activity   Alcohol use: Yes    Comment: occasional   Drug use: No   Sexual activity: Not Currently    Birth control/protection: Injection  Other Topics Concern   Not on file  Social History Narrative   Lives at home with her mother.   Right-handed.   One cup caffeine per day.   Social Determinants of Health   Financial Resource Strain: Not on file  Food Insecurity: Not on file  Transportation Needs: Not on file  Physical Activity: Not on file  Stress: Not on file  Social Connections: Not on file  Intimate Partner Violence: Not on file    PHYSICAL EXAM  Vitals:   06/21/22 1534  BP: 118/76  Pulse: 86  Weight: 218 lb (98.9 kg)  Height: '5\' 5"'$  (1.651 m)   Body mass index is 36.28 kg/m.  Generalized: Well developed, in no acute distress, anxious, bouncing legs Neurological examination  Mentation: Alert oriented to time, place, history taking. Follows all commands speech and language fluent Cranial nerve II-XII: Pupils were equal round reactive to light. Extraocular movements were full, visual field were full on confrontational test. Facial sensation and strength were normal. Head turning and shoulder shrug were normal and symmetric. Motor: The motor testing reveals 5  over 5 strength of all 4 extremities. Good symmetric motor tone is noted  throughout.  Sensory: Sensory testing is intact to soft touch on all 4 extremities. No evidence of extinction is noted.  Coordination: Cerebellar testing reveals good finger-nose-finger and heel-to-shin bilaterally.  Gait and station: Gait is normal.  Reflexes: Deep tendon reflexes are symmetric and normal bilaterally.   DIAGNOSTIC DATA (LABS, IMAGING, TESTING) - I reviewed patient records, labs, notes, testing and imaging myself where available.  Lab Results  Component Value Date   WBC 2.3 Repeated and verified X2. (L) 03/04/2022   HGB 10.3 (L) 03/04/2022   HCT 33.2 (L) 03/04/2022   MCV 83.7 03/04/2022   PLT 233.0 03/04/2022      Component Value Date/Time   NA 139 03/04/2022 0925   NA 140 10/03/2020 1030   K 3.8 03/04/2022 0925   CL 104 03/04/2022 0925   CO2 30 03/04/2022 0925   GLUCOSE 79 03/04/2022 0925   BUN 11 03/04/2022 0925   BUN 7 10/03/2020 1030   CREATININE 0.98 03/04/2022 0925   CREATININE 0.80 12/01/2020 1302   CREATININE 1.00 03/01/2016 1053   CALCIUM 9.0 03/04/2022 0925   PROT 6.8 03/04/2022 0925   PROT 7.0 10/03/2020 1030   ALBUMIN 4.2 03/04/2022 0925   ALBUMIN 4.1 10/03/2020 1030   AST 57 (H) 03/04/2022 0925   AST 21 12/01/2020 1302   ALT 49 (H) 03/04/2022 0925   ALT 15 12/01/2020 1302   ALKPHOS 75 03/04/2022 0925   BILITOT 0.4 03/04/2022 0925   BILITOT 0.3 12/01/2020 1302   GFRNONAA >60 12/01/2020 1302   GFRNONAA 64 03/01/2016 1053   GFRAA 84 10/03/2020 1030   GFRAA 74 03/01/2016 1053   Lab Results  Component Value Date   CHOL 233 (H) 03/04/2022   HDL 94.60 03/04/2022   LDLCALC 117 (H) 03/04/2022   TRIG 104.0 03/04/2022   CHOLHDL 2 03/04/2022   Lab Results  Component Value Date   HGBA1C 5.6 03/04/2022   Lab Results  Component Value Date   VITAMINB12 316 12/01/2020   Lab Results  Component Value Date   TSH 13.20 (H) 03/04/2022    Butler Denmark, AGNP-C, DNP  06/21/2022, 4:06 PM [facility] [address], [place], [place] [address] [phone]

## 2022-06-21 ENCOUNTER — Encounter: Payer: Self-pay | Admitting: Neurology

## 2022-06-21 ENCOUNTER — Ambulatory Visit: Payer: 59 | Admitting: Neurology

## 2022-06-21 ENCOUNTER — Other Ambulatory Visit (HOSPITAL_COMMUNITY): Payer: Self-pay

## 2022-06-21 VITALS — BP 118/76 | HR 86 | Ht 65.0 in | Wt 218.0 lb

## 2022-06-21 DIAGNOSIS — G43709 Chronic migraine without aura, not intractable, without status migrainosus: Secondary | ICD-10-CM | POA: Diagnosis not present

## 2022-06-21 MED ORDER — ELETRIPTAN HYDROBROMIDE 20 MG PO TABS
20.0000 mg | ORAL_TABLET | ORAL | 5 refills | Status: DC | PRN
Start: 2022-06-21 — End: 2022-07-15
  Filled 2022-06-21 – 2022-07-15 (×3): qty 10, 30d supply, fill #0

## 2022-06-21 MED ORDER — QULIPTA 60 MG PO TABS
60.0000 mg | ORAL_TABLET | Freq: Every day | ORAL | 11 refills | Status: DC
Start: 2022-06-21 — End: 2022-10-28
  Filled 2022-06-21 – 2022-07-01 (×3): qty 30, 30d supply, fill #0
  Filled 2022-08-09: qty 30, 30d supply, fill #1
  Filled 2022-09-21: qty 30, 30d supply, fill #2
  Filled 2022-10-15: qty 30, 30d supply, fill #3

## 2022-06-21 NOTE — Patient Instructions (Addendum)
Check labs Start Qulipta 60 mg daily for headache prevention, stop the Aimovig  Try Relpax for acute headache  Stay on the Topamax

## 2022-06-22 ENCOUNTER — Telehealth: Payer: Self-pay

## 2022-06-22 LAB — COMPREHENSIVE METABOLIC PANEL
ALT: 16 IU/L (ref 0–32)
AST: 24 IU/L (ref 0–40)
Albumin/Globulin Ratio: 1.6 (ref 1.2–2.2)
Albumin: 4.3 g/dL (ref 3.8–4.9)
Alkaline Phosphatase: 103 IU/L (ref 44–121)
BUN/Creatinine Ratio: 15 (ref 12–28)
BUN: 13 mg/dL (ref 8–27)
Bilirubin Total: <0.2 mg/dL (ref 0.0–1.2)
CO2: 24 mmol/L (ref 20–29)
Calcium: 9.8 mg/dL (ref 8.7–10.3)
Chloride: 111 mmol/L — ABNORMAL HIGH (ref 96–106)
Creatinine, Ser: 0.86 mg/dL (ref 0.57–1.00)
Globulin, Total: 2.7 g/dL (ref 1.5–4.5)
Glucose: 106 mg/dL — ABNORMAL HIGH (ref 70–99)
Potassium: 4.4 mmol/L (ref 3.5–5.2)
Sodium: 145 mmol/L — ABNORMAL HIGH (ref 134–144)
Total Protein: 7 g/dL (ref 6.0–8.5)
eGFR: 77 mL/min/{1.73_m2} (ref >59)

## 2022-06-22 NOTE — Telephone Encounter (Signed)
PA for Eletriptan has been sent via cover my meds.  (Key: [id number])  [employer] is reviewing your PA request. You may close this dialog, return to your dashboard, and perform other tasks.  To check for an update later, open this request again from your dashboard. If [employer] has not replied within 24 hours for urgent requests or within 48 hours for standard requests, please contact [employer] at [phone].

## 2022-06-22 NOTE — Telephone Encounter (Signed)
PA for Lenoria Chime has been sent.   (Key: [id number])  [employer] is reviewing your PA request. You may close this dialog, return to your dashboard, and perform other tasks.  To check for an update later, open this request again from your dashboard. If [employer] has not replied within 24 hours for urgent requests or within 48 hours for standard requests, please contact [employer] at [phone].

## 2022-06-29 ENCOUNTER — Other Ambulatory Visit: Payer: Self-pay

## 2022-06-30 ENCOUNTER — Other Ambulatory Visit: Payer: Self-pay

## 2022-07-01 ENCOUNTER — Other Ambulatory Visit: Payer: Self-pay

## 2022-07-01 ENCOUNTER — Other Ambulatory Visit (HOSPITAL_COMMUNITY): Payer: Self-pay

## 2022-07-15 ENCOUNTER — Other Ambulatory Visit: Payer: Self-pay | Admitting: Registered Nurse

## 2022-07-15 ENCOUNTER — Other Ambulatory Visit (HOSPITAL_COMMUNITY): Payer: Self-pay

## 2022-07-15 ENCOUNTER — Other Ambulatory Visit: Payer: Self-pay | Admitting: Lab

## 2022-07-15 ENCOUNTER — Other Ambulatory Visit: Payer: Self-pay

## 2022-07-15 ENCOUNTER — Telehealth: Payer: Self-pay | Admitting: Neurology

## 2022-07-15 DIAGNOSIS — E039 Hypothyroidism, unspecified: Secondary | ICD-10-CM

## 2022-07-15 MED ORDER — RIZATRIPTAN BENZOATE 10 MG PO TABS
10.0000 mg | ORAL_TABLET | ORAL | 0 refills | Status: DC | PRN
  Filled 2022-07-15: qty 10, 30d supply, fill #0

## 2022-07-15 NOTE — Telephone Encounter (Signed)
Got a note from pharmacy:  Hey, the Relpax requires Step Therapy - "TRIAL OF ORAL SUMATRIPTAN OR RIZATRIPTAN REQUIRED IN PREVIOUS 180 DAYS" - wasn't sure if you wanted to send in one of these altneratives for the patient. Thanks!  I called the patient. I will send in Maxalt 10 mg to try. She has tried in the past along with Imitrex but not within the last 180 days. She is doing better with Qulipta.   Meds ordered this encounter  Medications   rizatriptan (MAXALT) 10 MG tablet    Sig: Take 1 tablet (10 mg total) by mouth as needed for migraine. May repeat in 2 hours if needed    Dispense:  10 tablet    Refill:  0

## 2022-07-16 ENCOUNTER — Other Ambulatory Visit: Payer: Self-pay

## 2022-07-16 ENCOUNTER — Other Ambulatory Visit (HOSPITAL_COMMUNITY): Payer: Self-pay

## 2022-07-16 ENCOUNTER — Telehealth: Payer: Self-pay | Admitting: Registered Nurse

## 2022-07-16 DIAGNOSIS — E039 Hypothyroidism, unspecified: Secondary | ICD-10-CM

## 2022-07-16 MED ORDER — LEVOTHYROXINE SODIUM 137 MCG PO TABS
137.0000 ug | ORAL_TABLET | Freq: Every day | ORAL | 0 refills | Status: DC
  Filled 2022-07-16: qty 90, 90d supply, fill #0

## 2022-07-16 NOTE — Telephone Encounter (Signed)
Encourage patient to contact the pharmacy for refills or they can request refills through [facility]  (Please schedule appointment if patient has not been seen in over a year)    [place] TO:  [place] [phone]  MEDICATION NAME & DOSE: levothyroxine 137 mcg  NOTES/COMMENTS FROM PATIENT: pt is aware needs to establish with a new PCP      [place] office please notify patient: It takes 48-72 hours to process rx refill requests Ask patient to call pharmacy to ensure rx is ready before heading there.

## 2022-07-16 NOTE — Telephone Encounter (Signed)
Refill sent to pharmacy.   

## 2022-07-19 ENCOUNTER — Other Ambulatory Visit (HOSPITAL_COMMUNITY): Payer: Self-pay

## 2022-07-21 ENCOUNTER — Other Ambulatory Visit (HOSPITAL_COMMUNITY): Payer: Self-pay

## 2022-07-26 ENCOUNTER — Telehealth: Payer: Self-pay

## 2022-07-26 NOTE — Telephone Encounter (Signed)
Received form requesting change to pt levothyroxine, looks like just requesting approval to change manufacturers.  MA can fax back to the pharmacy

## 2022-07-26 NOTE — Telephone Encounter (Signed)
done

## 2022-08-09 ENCOUNTER — Other Ambulatory Visit: Payer: Self-pay | Admitting: Registered Nurse

## 2022-08-09 ENCOUNTER — Other Ambulatory Visit (HOSPITAL_COMMUNITY): Payer: Self-pay

## 2022-08-09 DIAGNOSIS — G8929 Other chronic pain: Secondary | ICD-10-CM

## 2022-08-09 MED ORDER — GABAPENTIN 100 MG PO CAPS
300.0000 mg | ORAL_CAPSULE | Freq: Every day | ORAL | 5 refills | Status: DC
  Filled 2022-08-09: qty 180, 60d supply, fill #0
  Filled 2022-09-21 – 2022-10-15 (×2): qty 180, 60d supply, fill #1
  Filled 2022-12-30: qty 180, 60d supply, fill #2
  Filled 2023-03-09: qty 180, 60d supply, fill #3
  Filled 2023-08-05: qty 180, 60d supply, fill #4

## 2022-09-04 ENCOUNTER — Encounter: Payer: Self-pay | Admitting: Neurology

## 2022-09-21 ENCOUNTER — Other Ambulatory Visit: Payer: Self-pay | Admitting: Family Medicine

## 2022-09-21 ENCOUNTER — Other Ambulatory Visit (HOSPITAL_COMMUNITY): Payer: Self-pay

## 2022-09-21 ENCOUNTER — Encounter: Payer: Self-pay | Admitting: Physician Assistant

## 2022-09-21 DIAGNOSIS — E039 Hypothyroidism, unspecified: Secondary | ICD-10-CM

## 2022-09-21 MED ORDER — ELETRIPTAN HYDROBROMIDE 20 MG PO TABS
20.0000 mg | ORAL_TABLET | ORAL | 3 refills | Status: DC | PRN
Start: 2022-09-21 — End: 2022-10-28
  Filled 2022-09-21 (×2): qty 10, 30d supply, fill #0
  Filled 2022-09-21: qty 10, fill #0

## 2022-09-21 MED ORDER — LEVOTHYROXINE SODIUM 137 MCG PO TABS
137.0000 ug | ORAL_TABLET | Freq: Every day | ORAL | 0 refills | Status: DC
  Filled 2022-09-21 – 2022-10-15 (×2): qty 90, 90d supply, fill #0

## 2022-09-21 NOTE — Telephone Encounter (Signed)
Is it ok to refill this medication for the pt ? She was a [name] pt . She has not found a new PCP at this time . I did offer JoAnna NP . She states her insurance is changing and if she is unable to find a new PCP she will call to est with [place] .

## 2022-09-22 ENCOUNTER — Other Ambulatory Visit (HOSPITAL_COMMUNITY): Payer: Self-pay

## 2022-09-22 NOTE — Telephone Encounter (Signed)
Informed pt that we where unable to refill this medication .

## 2022-09-22 NOTE — Telephone Encounter (Signed)
This medication was last filled 2 yrs ago by a historical provider (meaning not someone in our practice or on Epic)  No refill at this time.  She will need to ask Neuro or one of her current providers

## 2022-09-22 NOTE — Telephone Encounter (Signed)
Spoke to the pt and informed her that Dr Birdie Riddle has declined to refill her Rx methocarbamol

## 2022-09-27 ENCOUNTER — Other Ambulatory Visit (HOSPITAL_COMMUNITY): Payer: Self-pay

## 2022-10-07 ENCOUNTER — Encounter: Payer: Self-pay | Admitting: Physician Assistant

## 2022-10-09 ENCOUNTER — Encounter: Payer: Self-pay | Admitting: Physician Assistant

## 2022-10-15 ENCOUNTER — Other Ambulatory Visit (HOSPITAL_COMMUNITY): Payer: Self-pay

## 2022-10-15 ENCOUNTER — Other Ambulatory Visit: Payer: Self-pay | Admitting: Registered Nurse

## 2022-10-15 DIAGNOSIS — I10 Essential (primary) hypertension: Secondary | ICD-10-CM

## 2022-10-15 DIAGNOSIS — F411 Generalized anxiety disorder: Secondary | ICD-10-CM

## 2022-10-15 DIAGNOSIS — Z6837 Body mass index (BMI) 37.0-37.9, adult: Secondary | ICD-10-CM

## 2022-10-18 MED ORDER — PHENTERMINE HCL 37.5 MG PO CAPS: 37.5000 mg | ORAL_CAPSULE | ORAL | 0 refills | Status: DC

## 2022-10-18 MED ORDER — LOSARTAN POTASSIUM 25 MG PO TABS
25.0000 mg | ORAL_TABLET | Freq: Every day | ORAL | 1 refills | Status: DC
  Filled 2022-10-18: qty 90, 90d supply, fill #0
  Filled 2022-12-30: qty 90, 90d supply, fill #1

## 2022-10-18 MED ORDER — CITALOPRAM HYDROBROMIDE 20 MG PO TABS
30.0000 mg | ORAL_TABLET | Freq: Every day | ORAL | 1 refills | Status: DC
  Filled 2022-10-18: qty 135, 90d supply, fill #0
  Filled 2022-12-30: qty 135, 90d supply, fill #1

## 2022-10-19 ENCOUNTER — Other Ambulatory Visit (HOSPITAL_COMMUNITY): Payer: Self-pay

## 2022-10-19 ENCOUNTER — Other Ambulatory Visit: Payer: Self-pay

## 2022-10-20 ENCOUNTER — Other Ambulatory Visit: Payer: Self-pay

## 2022-10-21 ENCOUNTER — Ambulatory Visit: Payer: 59 | Admitting: Neurology

## 2022-10-25 ENCOUNTER — Other Ambulatory Visit: Payer: Self-pay | Admitting: Family Medicine

## 2022-10-26 NOTE — Telephone Encounter (Signed)
Gabapentin 100 mg LOV: 03/04/22 Last Refill:08/09/22 Upcoming appt: none

## 2022-10-27 ENCOUNTER — Other Ambulatory Visit (HOSPITAL_COMMUNITY): Payer: Self-pay

## 2022-10-27 NOTE — Progress Notes (Signed)
Patient: Julie Miranda Date of Birth: 02-24-1961  Reason for Visit: Follow up History from: Patient Primary Neurologist: Dr. Krista Blue   ASSESSMENT AND PLAN 62 y.o. year old female   1.  Chronic migraine headache -Take Qulipta 60 mg daily for migraine prevention -Try Relpax 20 mg as needed for acute headache -Previously tried and failed: Aimovig, Topamax, Nurtec, nortriptyline, Effexor, Maxalt, Imitrex -Other options: Botox for prevention, Ubrelvy for rescue -Follow-up in 4 months virtually   HISTORY  Julie Miranda is a 62 year old female, seen in request by her primary care physician nurse practitioner [name], for evaluation of chronic migraine, initial evaluation was April 30, 2020.  She works as a Psychologist, counselling for [employer]   I reviewed and summarized the referring note. She had a history of hypothyroidism, apparently noncompliant with her medications, multiple elevated TSH level, "sometimes I run out of the medication"   She reported lifelong history of migraine headaches, lateralized severe pounding headache with associated light noise sensitivity, nauseous, is usually stay on the right side, occasionally on the left side,   Over the years, she reported gradual worsening migraine headaches, half time of the month, she has to deal with different degree of migraine headaches, she tends to take 2 tablets of Excedrin Migraine, twice a day to help her migraine," sometimes I sleep with migraine, wake up with migraine   She was giving nortriptyline 25 mg as preventive medication, which did help her her sleep better, but she ran out of the medicine, has stopped taking it, not sure about the benefit for migraine   Laboratory evaluation showed normal CBC, CMP, TSH, was significantly elevated at 16.7,   UPDATE Dec 16 2020: She is accompanied by her mother Berenice Primas at today's clinical visit, she complains of worsening stress, depression anxiety, headaches, is taking  Celexa 20 mg daily, however never tried take nortriptyline regularly, she described nortriptyline did help her go to sleep, but feeling tired after waking up,   She was recently defined to have significant iron deficiency anemia, ferritin level was only 4, hemoglobin of 8.1, receiving iron infusion, which has helped her symptoms, is going through GI evaluation for potential GI loss.   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 Krista Blue 04/15/22, ordered Aimovig 70 mg, this is her 2nd month, she just did it. Is a behavorial health tech, hasn't missed any work, works overtime, a lot of personal stress. Daily headache. 85% are significant headaches. Pounding headaches, lights are bothersome. Nurtec doesn't help at all. On topamax 100 mg twice daily. Stopped taking Excedrin migraine. No more OTC medications. Doesn't sleep well, has insomnia.   Update October 28, 2022 SS: Stress at work, not getting as much overtime, cousin had heart attack, has to get new PCP. Remains on Qulipta 60 mg but not taking daily, only takes when  has headache. 3-4 headaches a week. No longer daily headache. Severity depends. Thinks mood is better. We had to try Maxalt before insurance would cover Relpax. Never able to get Relpax due to insurance but now  new plan start of year. Has been taking Tylenol, actually helps. Is in school for mental health counseling. Remains on Topamax 100 mg BID.  REVIEW OF SYSTEMS: Out of a complete 14 system review of symptoms, the patient complains only of the following symptoms, and all other reviewed systems are negative.  See HPI  ALLERGIES: Allergies  Allergen Reactions   Morphine And Related Nausea And Vomiting    HOME MEDICATIONS: Outpatient Medications Prior to Visit  Medication Sig Dispense Refill   AMOXICILLIN PO Take by mouth.     amoxicillin-clavulanate (AUGMENTIN) 875-125 MG tablet Take 1 tablet by mouth 2 (two) times daily. 20 tablet 0   atorvastatin (LIPITOR) 40 MG tablet Take 1 tablet (40 mg total) by mouth every other day. 90 tablet 1   azelastine (ASTELIN) 0.1 % nasal spray Place 1 spray into both nostrils 2 (two) times daily as directed 30 mL 12   busPIRone (BUSPAR) 10 MG tablet Take 1 tablet (10 mg total) by mouth 2 (two) times daily. 180 tablet 0   citalopram (CELEXA) 20 MG tablet Take 1.5 tablets (30 mg total) by mouth daily. 145 tablet 1   cyclobenzaprine (FLEXERIL) 10 MG tablet Take 1 tablet (10 mg total) by mouth at bedtime. 30 tablet 0   gabapentin (NEURONTIN) 100 MG capsule Take 300 mg by mouth every other day.     gabapentin (NEURONTIN) 100 MG capsule Take 3 capsules (300 mg total) by mouth at bedtime. 180 capsule 5   hydrochlorothiazide (HYDRODIURIL) 12.5 MG tablet Take 1 tablet (12.5 mg total) by mouth daily. 90 tablet 3   levothyroxine (SYNTHROID) 137 MCG tablet Take 1 tablet (137 mcg total) by mouth daily before breakfast. 90 tablet 0   losartan (COZAAR) 25 MG tablet Take 1 tablet (25 mg total) by mouth daily. 90 tablet 1   methocarbamol (ROBAXIN) 500 MG tablet Take 250 mg by mouth every 8 (eight) hours as needed for muscle spasms.     methylPREDNISolone (MEDROL DOSEPAK) 4 MG TBPK tablet Take as directed 21 tablet 0   pantoprazole (PROTONIX) 40 MG tablet Take 1 tablet (40 mg  total) by mouth daily. 90 tablet 3   phentermine 37.5 MG capsule Take 1 capsule (37.5 mg total) by mouth every morning. 90 capsule 0   tiZANidine (ZANAFLEX) 4 MG tablet Take 1 tablet (4 mg total) by mouth every 6 (six) hours as needed for muscle spasms. 30 tablet 6   Atogepant (QULIPTA) 60 MG TABS Take 60 mg by mouth daily. 30 tablet 11   eletriptan (RELPAX) 20 MG tablet Take 1 tablet (20 mg total) by mouth as needed for migraine or headache. May repeat in 2 hours if headache persists or recurs. 10 tablet 3   ondansetron (ZOFRAN) 4 MG tablet Take 1 tablet (4 mg total) by mouth every 8 (eight) hours as needed for nausea or vomiting. 20 tablet 11   topiramate (TOPAMAX) 100 MG tablet Take 1 tablet (100 mg total) by mouth 2 (two) times daily. 60 tablet 11   dicyclomine (BENTYL) 20 MG tablet TAKE 1 TABLET (20 MG TOTAL) BY MOUTH 3 (THREE) TIMES DAILY BEFORE MEALS. TAKE ONLY AS NEEDED FOR ABDOMINAL PAIN. 60 tablet 0   No facility-administered medications prior to visit.    PAST MEDICAL HISTORY: Past  Medical History:  Diagnosis Date   Anemia    Anxiety    Arthritis    Colon polyps    x 3   Depression    Edema of both lower extremities    Family history of adverse reaction to anesthesia    makes mother" loopy"   Gastric ulcer    GERD (gastroesophageal reflux disease)    Graves disease    History of pancytopenia    of unknown etiology    Hyperlipidemia    Hypertension    Hypothyroidism    IBS (irritable bowel syndrome)    Migraine    Osteoarthritis    Thyroid disease    Vitamin D deficiency     PAST SURGICAL HISTORY: Past Surgical History:  Procedure Laterality Date   COLONOSCOPY     GASTRIC BYPASS     JOINT REPLACEMENT N/A    [employer] 06/12/2020   POLYPECTOMY     TOTAL KNEE ARTHROPLASTY Left 07/30/2019   Procedure: LEFT TOTAL KNEE ARTHROPLASTY;  Surgeon: Leandrew Koyanagi, MD;  Location: [facility];  Service: Orthopedics;  Laterality: Left;    FAMILY HISTORY: Family History  Problem  Relation Age of Onset   Hypertension Mother    Gout Mother    Diabetes Mother    High Cholesterol Mother    Anxiety disorder Mother    Obesity Mother    Stroke Father    Stroke Sister    Pancreatic cancer Sister    Diabetes Maternal Aunt        x 2   Liver cancer Maternal Aunt    Colon cancer Neg Hx    Kidney disease Neg Hx    Colon polyps Neg Hx    Esophageal cancer Neg Hx    Rectal cancer Neg Hx    Stomach cancer Neg Hx     SOCIAL HISTORY: Social History   Socioeconomic History   Marital status: Single    Spouse name: Not on file   Number of children: 0   Years of education: some college   Highest education level: Not on file  Occupational History   Occupation: Chartered certified accountant  Tobacco Use   Smoking status: Never   Smokeless tobacco: Never  Vaping Use   Vaping Use: Never used  Substance and Sexual Activity   Alcohol use: Yes    Comment: occasional   Drug use: No   Sexual activity: Not Currently    Birth control/protection: Injection  Other Topics Concern   Not on file  Social History Narrative   Lives at home with her mother.   Right-handed.   One cup caffeine per day.   Social Determinants of Health   Financial Resource Strain: Not on file  Food Insecurity: Not on file  Transportation Needs: Not on file  Physical Activity: Not on file  Stress: Not on file  Social Connections: Not on file  Intimate Partner Violence: Not on file    PHYSICAL EXAM  Vitals:   10/28/22 0742  BP: 126/82  Pulse: 68  Weight: 210 lb (95.3 kg)  Height: '5\' 5"'$  (1.651 m)    Body mass index is 34.95 kg/m.  Generalized: Well developed, in no acute distress, anxious, tired looking Neurological examination  Mentation: Alert oriented to time, place, history taking. Follows all commands speech and language fluent Cranial nerve II-XII: Pupils were equal round reactive to light. Extraocular movements were full, visual field were full on confrontational test. Facial sensation and  strength were normal. Head turning and  shoulder shrug were normal and symmetric. Motor: The motor testing reveals 5 over 5 strength of all 4 extremities. Good symmetric motor tone is noted throughout.  Sensory: Sensory testing is intact to soft touch on all 4 extremities. No evidence of extinction is noted.  Coordination: Cerebellar testing reveals good finger-nose-finger and heel-to-shin bilaterally.  Gait and station: Gait is normal.   DIAGNOSTIC DATA (LABS, IMAGING, TESTING) - I reviewed patient records, labs, notes, testing and imaging myself where available.  Lab Results  Component Value Date   WBC 2.3 Repeated and verified X2. (L) 03/04/2022   HGB 10.3 (L) 03/04/2022   HCT 33.2 (L) 03/04/2022   MCV 83.7 03/04/2022   PLT 233.0 03/04/2022      Component Value Date/Time   NA 145 (H) 06/21/2022 1608   K 4.4 06/21/2022 1608   CL 111 (H) 06/21/2022 1608   CO2 24 06/21/2022 1608   GLUCOSE 106 (H) 06/21/2022 1608   GLUCOSE 79 03/04/2022 0925   BUN 13 06/21/2022 1608   CREATININE 0.86 06/21/2022 1608   CREATININE 0.80 12/01/2020 1302   CREATININE 1.00 03/01/2016 1053   CALCIUM 9.8 06/21/2022 1608   PROT 7.0 06/21/2022 1608   ALBUMIN 4.3 06/21/2022 1608   AST 24 06/21/2022 1608   AST 21 12/01/2020 1302   ALT 16 06/21/2022 1608   ALT 15 12/01/2020 1302   ALKPHOS 103 06/21/2022 1608   BILITOT <0.2 06/21/2022 1608   BILITOT 0.3 12/01/2020 1302   GFRNONAA >60 12/01/2020 1302   GFRNONAA 64 03/01/2016 1053   GFRAA 84 10/03/2020 1030   GFRAA 74 03/01/2016 1053   Lab Results  Component Value Date   CHOL 233 (H) 03/04/2022   HDL 94.60 03/04/2022   LDLCALC 117 (H) 03/04/2022   TRIG 104.0 03/04/2022   CHOLHDL 2 03/04/2022   Lab Results  Component Value Date   HGBA1C 5.6 03/04/2022   Lab Results  Component Value Date   VITAMINB12 316 12/01/2020   Lab Results  Component Value Date   TSH 13.20 (H) 03/04/2022    Butler Denmark, AGNP-C, DNP 10/28/2022, 8:11 AM [facility] [address], [place], [place] [address] [phone]

## 2022-10-28 ENCOUNTER — Encounter: Payer: Self-pay | Admitting: Physician Assistant

## 2022-10-28 ENCOUNTER — Telehealth: Payer: Self-pay

## 2022-10-28 ENCOUNTER — Other Ambulatory Visit (HOSPITAL_COMMUNITY): Payer: Self-pay

## 2022-10-28 ENCOUNTER — Ambulatory Visit (INDEPENDENT_AMBULATORY_CARE_PROVIDER_SITE_OTHER): Payer: Commercial Managed Care - PPO | Admitting: Neurology

## 2022-10-28 VITALS — BP 126/82 | HR 68 | Ht 65.0 in | Wt 210.0 lb

## 2022-10-28 DIAGNOSIS — R110 Nausea: Secondary | ICD-10-CM | POA: Diagnosis not present

## 2022-10-28 DIAGNOSIS — G43709 Chronic migraine without aura, not intractable, without status migrainosus: Secondary | ICD-10-CM

## 2022-10-28 MED ORDER — ELETRIPTAN HYDROBROMIDE 20 MG PO TABS
20.0000 mg | ORAL_TABLET | ORAL | 3 refills | Status: DC | PRN
  Filled 2022-10-28: qty 10, 30d supply, fill #0
  Filled 2022-12-30: qty 10, 5d supply, fill #0

## 2022-10-28 MED ORDER — TOPIRAMATE 100 MG PO TABS
100.0000 mg | ORAL_TABLET | Freq: Two times a day (BID) | ORAL | 11 refills | Status: DC
  Filled 2022-12-30: qty 60, 30d supply, fill #0
  Filled 2023-03-09: qty 60, 30d supply, fill #1
  Filled 2023-04-03: qty 60, 30d supply, fill #2
  Filled 2023-08-05: qty 60, 30d supply, fill #3

## 2022-10-28 MED ORDER — ONDANSETRON HCL 4 MG PO TABS
4.0000 mg | ORAL_TABLET | Freq: Three times a day (TID) | ORAL | 2 refills | Status: DC | PRN
  Filled 2022-10-28: qty 20, 7d supply, fill #0
  Filled 2022-12-30: qty 20, 7d supply, fill #1
  Filled 2023-04-03: qty 20, 7d supply, fill #2

## 2022-10-28 MED ORDER — QULIPTA 60 MG PO TABS
60.0000 mg | ORAL_TABLET | Freq: Every day | ORAL | 11 refills | Status: DC
  Filled 2022-12-30 – 2023-04-03 (×2): qty 30, 30d supply, fill #0
  Filled 2023-05-16: qty 30, 30d supply, fill #1
  Filled 2023-08-29: qty 30, 30d supply, fill #2
  Filled 2023-10-10: qty 30, 30d supply, fill #3

## 2022-10-28 NOTE — Patient Instructions (Signed)
Start taking Qulipta daily  Will try to get coverage for Relpax, other option may be Roselyn Meier  See you back in 4 months   Meds ordered this encounter  Medications   topiramate (TOPAMAX) 100 MG tablet    Sig: Take 1 tablet (100 mg total) by mouth 2 (two) times daily.    Dispense:  60 tablet    Refill:  11   eletriptan (RELPAX) 20 MG tablet    Sig: Take 1 tablet (20 mg total) by mouth as needed for migraine or headache. May repeat in 2 hours if headache persists or recurs.    Dispense:  10 tablet    Refill:  3   Atogepant (QULIPTA) 60 MG TABS    Sig: Take 60 mg by mouth daily.    Dispense:  30 tablet    Refill:  11   ondansetron (ZOFRAN) 4 MG tablet    Sig: Take 1 tablet (4 mg total) by mouth every 8 (eight) hours as needed for nausea or vomiting.    Dispense:  20 tablet    Refill:  2

## 2022-10-28 NOTE — Telephone Encounter (Signed)
I attempted to submit a PA for eletriptan hydrobromide 20 mg tablets. I have received the following message on covermymeds:   "This drug/product is not covered under the pharmacy benefit. Prior Authorization is not available."  "EXCLUDED FROM COVERAGE: CONSIDER GENERIC ALTERNATIVES: RIZATRIPTAN TABLET OR SUMATRIPTAN TABLET."

## 2022-10-28 NOTE — Telephone Encounter (Signed)
I spoke with Lilyan Punt at [facility] (phone #: [phone]) to attempt prior authorization for the patient's medication. [employer]'s system is down. Lilyan Punt suggested using their online service through [email or web address] or calling again later. Will call back.

## 2022-11-01 ENCOUNTER — Other Ambulatory Visit (HOSPITAL_COMMUNITY): Payer: Self-pay

## 2022-11-01 MED ORDER — ALBUTEROL SULFATE HFA 108 (90 BASE) MCG/ACT IN AERS
2.0000 | INHALATION_SPRAY | RESPIRATORY_TRACT | 0 refills | Status: AC | PRN
  Filled 2022-11-01: qty 6.7, 17d supply, fill #0

## 2022-11-01 MED ORDER — FLUTICASONE PROPIONATE 50 MCG/ACT NA SUSP
1.0000 | Freq: Every day | NASAL | 0 refills | Status: DC
  Filled 2022-11-01: qty 16, 60d supply, fill #0

## 2022-11-01 MED ORDER — PROMETHAZINE-DM 6.25-15 MG/5ML PO SYRP
5.0000 mL | ORAL_SOLUTION | ORAL | 0 refills | Status: DC | PRN
  Filled 2022-11-01: qty 118, 4d supply, fill #0

## 2022-11-01 NOTE — Telephone Encounter (Signed)
This was an extended phone call (35 minutes).   I called [facility] and spoke with Ruben Gottron I was transferred to the commercial pharmacy department at [phone]. I spoke with Elane Fritz who stated the department was for IV injections only. The member has no pharmacy benefit with [employer]. She transferred me to med impact at [phone]. I spoke with Sonia Side who automatically transferred me to the support team member, Pam. I was able to complete a PA over the phone with Pam. [place] # - [phone]. The determination will be faxed to [place] within 24-72 business hours.

## 2022-11-09 ENCOUNTER — Encounter: Payer: Self-pay | Admitting: Physician Assistant

## 2022-11-09 ENCOUNTER — Other Ambulatory Visit (HOSPITAL_COMMUNITY): Payer: Self-pay

## 2022-11-09 NOTE — Telephone Encounter (Signed)
Pharmacy Patient Advocate Encounter   Received notification from [facility] that prior authorization for Ubrelvy '100MG'$  tablets is required/requested.    PA submitted on 11/09/2022 to (ins) [employer] via CoverMyMeds Key [id number] Status is pending

## 2022-11-09 NOTE — Telephone Encounter (Signed)
Pharmacy Patient Advocate Encounter  Prior Authorization for Roselyn Meier '100MG'$  tablets has been approved.    PA# PA Case ID: [id number] Effective dates: 11/09/2022 through 05/09/2023

## 2022-11-10 ENCOUNTER — Other Ambulatory Visit (HOSPITAL_COMMUNITY): Payer: Self-pay

## 2022-11-10 MED ORDER — UBRELVY 100 MG PO TABS
ORAL_TABLET | ORAL | 0 refills | Status: DC
  Filled 2022-11-10: qty 12, 30d supply, fill #0

## 2022-11-10 NOTE — Telephone Encounter (Signed)
I spoke with the patient to inform her of the approval. She is agreeable to trying Ubrelvy 100 mg tablets for migraines.

## 2022-11-10 NOTE — Addendum Note (Signed)
Addended by: Cristela Felt E on: 11/10/2022 01:59 PM   Modules accepted: Orders

## 2022-11-17 ENCOUNTER — Other Ambulatory Visit (HOSPITAL_COMMUNITY): Payer: Self-pay

## 2022-12-03 ENCOUNTER — Encounter: Payer: Self-pay | Admitting: Internal Medicine

## 2022-12-08 ENCOUNTER — Other Ambulatory Visit: Payer: Self-pay

## 2022-12-30 ENCOUNTER — Other Ambulatory Visit: Payer: Self-pay | Admitting: Neurology

## 2022-12-30 ENCOUNTER — Encounter: Payer: Self-pay | Admitting: Neurology

## 2022-12-30 ENCOUNTER — Other Ambulatory Visit: Payer: Self-pay | Admitting: Family Medicine

## 2022-12-30 ENCOUNTER — Other Ambulatory Visit: Payer: Self-pay | Admitting: Nurse Practitioner

## 2022-12-30 ENCOUNTER — Other Ambulatory Visit (HOSPITAL_COMMUNITY): Payer: Self-pay

## 2022-12-30 DIAGNOSIS — E039 Hypothyroidism, unspecified: Secondary | ICD-10-CM

## 2022-12-31 ENCOUNTER — Other Ambulatory Visit (HOSPITAL_COMMUNITY): Payer: Self-pay

## 2022-12-31 ENCOUNTER — Other Ambulatory Visit (HOSPITAL_BASED_OUTPATIENT_CLINIC_OR_DEPARTMENT_OTHER): Payer: Self-pay

## 2022-12-31 ENCOUNTER — Other Ambulatory Visit: Payer: Self-pay

## 2023-01-03 ENCOUNTER — Other Ambulatory Visit (HOSPITAL_COMMUNITY): Payer: Self-pay

## 2023-01-06 ENCOUNTER — Other Ambulatory Visit (HOSPITAL_COMMUNITY): Payer: Self-pay

## 2023-01-06 MED ORDER — UBRELVY 100 MG PO TABS
ORAL_TABLET | ORAL | 0 refills | Status: DC
  Filled 2023-01-06: qty 12, 30d supply, fill #0

## 2023-01-06 MED ORDER — METHYLPREDNISOLONE 4 MG PO TBPK
ORAL_TABLET | ORAL | 0 refills | Status: DC
  Filled 2023-01-06: qty 21, 6d supply, fill #0

## 2023-01-07 ENCOUNTER — Other Ambulatory Visit (HOSPITAL_COMMUNITY): Payer: Self-pay

## 2023-01-11 ENCOUNTER — Telehealth: Payer: Self-pay

## 2023-01-11 ENCOUNTER — Other Ambulatory Visit (HOSPITAL_COMMUNITY): Payer: Self-pay

## 2023-01-11 ENCOUNTER — Other Ambulatory Visit: Payer: Self-pay

## 2023-01-11 ENCOUNTER — Encounter: Payer: Self-pay | Admitting: Physician Assistant

## 2023-01-11 NOTE — Telephone Encounter (Signed)
Patient sent a MyChart message requesting to start Botox.  Can we proceed with getting approval from insurance.  Previously tried and failed: Qulipta, Aimovig, Topamax, Nurtec, nortriptyline, Effexor, Maxalt, Imitrex, Ubrelvy.  Relpax was not covered by insurance.

## 2023-01-11 NOTE — Telephone Encounter (Signed)
PA needed for BOTOX [id number] CPT: E5977006 ICD: G43.709 200 units

## 2023-01-11 NOTE — Telephone Encounter (Signed)
I have entered the information and sent to the PA team for review

## 2023-01-12 ENCOUNTER — Other Ambulatory Visit (HOSPITAL_COMMUNITY): Payer: Self-pay

## 2023-01-12 ENCOUNTER — Other Ambulatory Visit: Payer: Self-pay

## 2023-01-13 ENCOUNTER — Other Ambulatory Visit (HOSPITAL_COMMUNITY): Payer: Self-pay

## 2023-01-18 ENCOUNTER — Encounter: Payer: Self-pay | Admitting: Physician Assistant

## 2023-01-19 DIAGNOSIS — Z0289 Encounter for other administrative examinations: Secondary | ICD-10-CM

## 2023-01-20 NOTE — Telephone Encounter (Signed)
FMLA form faxed to [employer]

## 2023-01-21 ENCOUNTER — Other Ambulatory Visit (HOSPITAL_COMMUNITY): Payer: Self-pay

## 2023-01-25 ENCOUNTER — Telehealth: Payer: Self-pay | Admitting: Neurology

## 2023-01-25 ENCOUNTER — Other Ambulatory Visit: Payer: Self-pay

## 2023-01-25 NOTE — Telephone Encounter (Signed)
Couldn't reach pt by phone, line kept disconnecting. Sent mychart msg advising of appointment change- NP out 4/29.

## 2023-01-26 ENCOUNTER — Encounter: Payer: Self-pay | Admitting: Physician Assistant

## 2023-01-26 ENCOUNTER — Other Ambulatory Visit: Payer: Self-pay

## 2023-01-26 ENCOUNTER — Other Ambulatory Visit (HOSPITAL_COMMUNITY): Payer: Self-pay

## 2023-01-26 NOTE — Telephone Encounter (Signed)
   Benefit Verification [id number] Submitted!

## 2023-01-26 NOTE — Telephone Encounter (Signed)
Pharmacy Patient Advocate Encounter   Received notification from [facility] that prior authorization for Botox 200 Units is required/requested.   PA submitted on 01/26/2023 to (ins) [employer] via CoverMyMeds Key or [facility]) confirmation # [id number] Status is pending

## 2023-01-28 ENCOUNTER — Other Ambulatory Visit (HOSPITAL_COMMUNITY): Payer: Self-pay

## 2023-01-28 ENCOUNTER — Encounter: Payer: Self-pay | Admitting: Physician Assistant

## 2023-01-28 NOTE — Telephone Encounter (Signed)
Pharmacy Patient Advocate Encounter- Botox BIV-Pharmacy Benefit:  PA was submitted to [employer] and has been approved through: 07/26/2023 Authorization# PA Case ID #: [id number]  Please send prescription to Specialty Pharmacy: [facility]: [phone]  Estimated Copay is: $750.00  Patient Is eligible for Botox Copay Card, which will make patient's copay as little as zero. Copay card will be provided to pharmacy.

## 2023-01-31 ENCOUNTER — Other Ambulatory Visit: Payer: Self-pay

## 2023-01-31 ENCOUNTER — Other Ambulatory Visit (HOSPITAL_COMMUNITY): Payer: Self-pay

## 2023-01-31 ENCOUNTER — Encounter: Payer: Self-pay | Admitting: Physician Assistant

## 2023-01-31 MED ORDER — ONABOTULINUMTOXINA 200 UNITS IJ SOLR
200.0000 [IU] | INTRAMUSCULAR | 2 refills | Status: DC
  Filled 2023-01-31 (×2): qty 1, 90d supply, fill #0
  Filled 2023-07-07: qty 1, 90d supply, fill #1
  Filled 2023-11-07: qty 1, 90d supply, fill #2

## 2023-01-31 NOTE — Telephone Encounter (Signed)
Scheduled for first available and added to the wait list.

## 2023-01-31 NOTE — Telephone Encounter (Signed)
Please send Rx to [facility]. 

## 2023-01-31 NOTE — Telephone Encounter (Signed)
Sarah, neither you or Dr. Terrace Arabia have anything available before July. Is this ok or would you like her worked in somewhere sooner?

## 2023-01-31 NOTE — Addendum Note (Signed)
Addended by: Deatra James on: 01/31/2023 10:15 AM   Modules accepted: Orders

## 2023-02-02 ENCOUNTER — Ambulatory Visit: Payer: Commercial Managed Care - PPO | Admitting: Family

## 2023-02-04 ENCOUNTER — Other Ambulatory Visit (HOSPITAL_COMMUNITY): Payer: Self-pay

## 2023-02-04 ENCOUNTER — Ambulatory Visit: Payer: Commercial Managed Care - PPO | Admitting: Family

## 2023-02-04 ENCOUNTER — Encounter: Payer: Self-pay | Admitting: Family

## 2023-02-04 VITALS — BP 125/75 | HR 74 | Temp 97.8°F | Ht 65.0 in | Wt 206.8 lb

## 2023-02-04 DIAGNOSIS — F411 Generalized anxiety disorder: Secondary | ICD-10-CM | POA: Diagnosis not present

## 2023-02-04 DIAGNOSIS — M25511 Pain in right shoulder: Secondary | ICD-10-CM | POA: Diagnosis not present

## 2023-02-04 DIAGNOSIS — E039 Hypothyroidism, unspecified: Secondary | ICD-10-CM | POA: Diagnosis not present

## 2023-02-04 DIAGNOSIS — M25512 Pain in left shoulder: Secondary | ICD-10-CM

## 2023-02-04 MED ORDER — LEVOTHYROXINE SODIUM 137 MCG PO TABS
137.0000 ug | ORAL_TABLET | Freq: Every day | ORAL | 1 refills | Status: DC
  Filled 2023-02-04: qty 90, 90d supply, fill #0
  Filled 2023-04-03 – 2023-05-16 (×2): qty 90, 90d supply, fill #1

## 2023-02-04 NOTE — Progress Notes (Signed)
New Patient Office Visit  Subjective:  Patient ID: Julie Miranda, female    DOB: 04/27/1961  Age: 62 y.o. MRN: [id number]  CC:  Chief Complaint  Patient presents with   New Patient (Initial Visit)    Previous PCP moved   Hypothyroidism    Medication refills    Shoulder Pain    Pt c/o bilateral shoulder pain, Has tried cortisone shots years ago which did not help. Pain is a burning sensation.    Anxiety    Pt states her anxiety is very high and unable to sleep. Has tried Ambien in the past.    HPI KEENYA HIOTT presents for establishing care today.  Neuropathy/Migraines:  bilateral feet and hands, takes Neurontin and Qulipta. Sees neurology for migraines and getting ready for Botox injections soon.   Bilateral shoulder pain:  hx of chronic left & right shoulder, left has had pain for longer, states she has had steroid injections in both shoulders that only lasted 2 weeks, has had PT but does not help. Pt works lifting and reaching which exacerbates her pain.    Anxiety:  pt reports taking Celexa 40mg  for a few years also has Buspirone 10mg  but only taking as needed. States her anxiety is worse, celexa no longer working.  Hypothyroidism: Patient presents today for followup of Hypothyroidism.  Patient reports positive compliance with daily medication, Levo qd. Patient denies any of the following symptoms: fatigue, cold intolerance, constipation, weight gain or inability to lose weight, muscle weakness, mental slowing, dry hair and skin. 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 f/u prn   Orders: -     Ambulatory referral to Orthopedic Surgery  Hypothyroidism, unspecified type Assessment & Plan: chronic last labs last May, low T4, dose increased pt ran out of refills sending Levothyroxine qd f/u in 2 mos to recheck labs  Orders: -     Levothyroxine Sodium; Take 1 tablet (137 mcg total) by mouth daily before breakfast.  Dispense: 90 tablet; Refill: 1  Generalized anxiety disorder Assessment & Plan: chronic taking Celexa 40mg  qd with Buspar 10mg  prn states her anxiety is uncontrolled pt is rocking forward back and forth in chair  advised she can take the Buspar daily and up to tid sending referral to Psych denies needing refills today f/u in 2 mos  Orders: -     Ambulatory referral to Psychiatry   Subjective:    Outpatient Medications Prior to Visit  Medication Sig Dispense Refill   albuterol (VENTOLIN HFA) 108 (90 Base) MCG/ACT inhaler Inhale 2 puffs into the lungs every 4 (four) hours as needed for shortness of  breath 6.7 g 0   Atogepant (QULIPTA) 60 MG TABS Take 60 mg by mouth daily. 30 tablet 11   atorvastatin (LIPITOR) 40 MG tablet Take 1 tablet (40 mg total) by mouth every other day. 90 tablet 1   azelastine (ASTELIN) 0.1 % nasal spray Place 1 spray into both nostrils 2 (two) times daily as directed 30 mL 12   botulinum toxin Type A (BOTOX) 200 units injection Inject 200 Units into the muscle every 3 (three) months. 1 each 2   citalopram (CELEXA) 20 MG tablet Take 1.5 tablets (30 mg total) by mouth daily. (Patient taking differently: Take 40  mg by mouth daily.) 145 tablet 1   cyclobenzaprine (FLEXERIL) 10 MG tablet Take 1 tablet (10 mg total) by mouth at bedtime. 30 tablet 0   fluticasone (FLONASE) 50 MCG/ACT nasal spray Place 1 spray into both nostrils daily. 16 g 0   gabapentin (NEURONTIN) 100 MG capsule Take 3 capsules (300 mg total) by mouth at bedtime. 180 capsule 5   hydrochlorothiazide (HYDRODIURIL) 12.5 MG tablet Take 1 tablet (12.5 mg total) by mouth daily. 90 tablet 3   losartan (COZAAR) 25 MG tablet Take 1 tablet (25 mg total) by mouth daily. 90 tablet 1   ondansetron (ZOFRAN) 4 MG tablet Take 1 tablet (4 mg total) by mouth every 8 (eight) hours as needed for nausea or vomiting. 20 tablet 2   pantoprazole (PROTONIX) 40 MG tablet Take 1 tablet (40 mg total) by mouth daily. 90 tablet 3   tiZANidine (ZANAFLEX) 4 MG tablet Take 1 tablet (4 mg total) by mouth every 6 (six) hours as needed for muscle spasms. 30 tablet 6   topiramate (TOPAMAX) 100 MG tablet Take 1 tablet (100 mg total) by mouth 2 (two) times daily. 60 tablet 11   AMOXICILLIN PO Take by mouth.     amoxicillin-clavulanate (AUGMENTIN) 875-125 MG tablet Take 1 tablet by mouth 2 (two) times daily. 20 tablet 0   busPIRone (BUSPAR) 10 MG tablet Take 1 tablet (10 mg total) by mouth 2 (two) times daily. 180 tablet 0   eletriptan (RELPAX) 20 MG tablet Take 1 tablet (20 mg total) by mouth as needed for migraine or headache. May repeat in 2 hours if headache persists or recurs. 10 tablet 3   gabapentin (NEURONTIN) 100 MG capsule Take 300 mg by mouth every other day.     levothyroxine (SYNTHROID) 137 MCG tablet Take 1 tablet (137 mcg total) by mouth daily before breakfast. 90 tablet 0   methocarbamol (ROBAXIN) 500 MG tablet Take 250 mg by mouth every 8 (eight) hours as needed for muscle spasms.     methylPREDNISolone (MEDROL DOSEPAK) 4 MG TBPK tablet Take as directed 21 tablet 0   phentermine 37.5 MG capsule Take 1 capsule (37.5 mg total) by mouth every morning. 90 capsule 0    promethazine-dextromethorphan (PROMETHAZINE-DM) 6.25-15 MG/5ML syrup Take 5 mLs by mouth every 4 (four) hours as needed for cough 118 mL 0   Ubrogepant (UBRELVY) 100 MG TABS Take 1 tablet at onset of migraine.  May repeat in 2 hours, if needed.  Max dose: 2 tabs/day. This is a 30 day prescription. 12 tablet 0   dicyclomine (BENTYL) 20 MG tablet TAKE 1 TABLET (20 MG TOTAL) BY MOUTH 3 (THREE) TIMES DAILY BEFORE MEALS. TAKE ONLY AS NEEDED FOR ABDOMINAL PAIN. 60 tablet 0   No facility-administered medications prior to visit.   Past Medical History:  Diagnosis Date   Acute low back pain  due to trauma 06/28/2013   Acute renal failure 06/27/2013   Anemia    Anemia, unspecified 06/28/2013   Anxiety    Arthritis    Chronic pain of left knee 06/05/2019   Class 2 severe obesity due to excess calories with serious comorbidity and body mass index (BMI) of 37.0 to 37.9 in adult 07/08/2020   Colon polyps    x 3   Costochondritis 03/01/2016   Dehydration 06/28/2013   Depression    Edema of both lower extremities    Family history of adverse reaction to anesthesia    makes mother" loopy"   Gastric ulcer    GERD (gastroesophageal reflux disease)    Graves disease    History of pancytopenia    of unknown etiology    Hyperlipidemia    Hypertension    Hypokalemia 06/27/2013   Hypothyroidism    IBS (irritable bowel syndrome)    Iron deficiency 12/16/2020   Migraine    Neutropenia 07/11/2015   Osteoarthritis    Syncope and collapse 06/27/2013   Thyroid disease    Vitamin D deficiency    Past Surgical History:  Procedure Laterality Date   COLONOSCOPY     GASTRIC BYPASS     JOINT REPLACEMENT N/A    Phreesia 06/12/2020   POLYPECTOMY     TOTAL KNEE ARTHROPLASTY Left 07/30/2019   Procedure: LEFT TOTAL KNEE ARTHROPLASTY;  Surgeon: Tarry Kos, MD;  Location: [facility] OR;  Service: Orthopedics;  Laterality: Left;    Objective:   Today's Vitals: BP 125/75 (BP Location: Left Arm, Patient Position:  Sitting, Cuff Size: Large)   Pulse 74   Temp 97.8 F (36.6 C) (Temporal)   Ht 5\' 5"  (1.651 m)   Wt 206 lb 12.8 oz (93.8 kg)   LMP  (LMP Unknown) Comment: pt. states that it has been over 1 year  SpO2 100%   BMI 34.41 kg/m   Physical Exam Vitals and nursing note reviewed.  Constitutional:      Appearance: Normal appearance. She is obese.  Cardiovascular:     Rate and Rhythm: Normal rate and regular rhythm.  Pulmonary:     Effort: Pulmonary effort is normal.     Breath sounds: Normal breath sounds.  Musculoskeletal:     Right shoulder: Tenderness present. Decreased range of motion.     Left shoulder: Tenderness present. Decreased range of motion.  Skin:    General: Skin is warm and dry.  Neurological:     Mental Status: She is alert.  Psychiatric:        Mood and Affect: Mood normal.        Behavior: Behavior normal.    Meds ordered this encounter  Medications   levothyroxine (SYNTHROID) 137 MCG tablet    Sig: Take 1 tablet (137 mcg total) by mouth daily before breakfast.    Dispense:  90 tablet    Refill:  1   Dulce Sellar, NP

## 2023-02-04 NOTE — Patient Instructions (Addendum)
It was very nice to see you today!   I have sent your thyroid medicine to your pharmacy. I have sent 2 referrals to Orthopedics and Psychiatry. They will call you directly to schedule an appointment.  Schedule a follow visit for 2 months to check your labs and for other refills.     PLEASE NOTE:  If you had any lab tests please let us know if you have not heard back within a few days. You may see your results on MyChart before we have a chance to review them but we will give you a call once they are reviewed by [place]. If we ordered any referrals today, please let us know if you have not heard from their office within the next week.

## 2023-02-06 ENCOUNTER — Encounter: Payer: Self-pay | Admitting: Family

## 2023-02-06 NOTE — Assessment & Plan Note (Signed)
chronic last labs last May, low T4, dose increased pt ran out of refills sending Levothyroxine qd f/u in 2 mos to recheck labs

## 2023-02-06 NOTE — Assessment & Plan Note (Signed)
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 f/u prn

## 2023-02-06 NOTE — Assessment & Plan Note (Addendum)
chronic taking Celexa 40mg  qd with Buspar 10mg  prn states her anxiety is uncontrolled pt is rocking forward back and forth in chair  advised she can take the Buspar daily and up to tid sending referral to Psych denies needing refills today f/u in 2 mos

## 2023-02-11 ENCOUNTER — Encounter: Payer: Self-pay | Admitting: Orthopaedic Surgery

## 2023-02-11 ENCOUNTER — Other Ambulatory Visit (INDEPENDENT_AMBULATORY_CARE_PROVIDER_SITE_OTHER): Payer: Commercial Managed Care - PPO

## 2023-02-11 ENCOUNTER — Ambulatory Visit: Payer: Commercial Managed Care - PPO | Admitting: Orthopaedic Surgery

## 2023-02-11 DIAGNOSIS — M25512 Pain in left shoulder: Secondary | ICD-10-CM

## 2023-02-11 DIAGNOSIS — M25511 Pain in right shoulder: Secondary | ICD-10-CM

## 2023-02-11 DIAGNOSIS — G8929 Other chronic pain: Secondary | ICD-10-CM

## 2023-02-11 NOTE — Progress Notes (Addendum)
Office Visit Note   Patient: Julie Miranda           Date of Birth: [birth year]           MRN: [id number] Visit Date: 02/11/2023              Requested by: Dulce Sellar, NP [address] [place],  [place] [address] PCP: Dulce Sellar, NP   Assessment & Plan: Visit Diagnoses:  1. Chronic pain of both shoulders     Plan: Impression 62 year old female with chronic bilateral shoulder pain that has not improved with physical therapy or steroid injections.  At this point will need MRIs of both shoulders to rule out structural abnormalities.  Will also obtain arthritis panel.  Follow-Up Instructions: No follow-ups on file.   Orders:  Orders Placed This Encounter  Procedures   XR Shoulder Left   XR Shoulder Right   No orders of the defined types were placed in this encounter.     Procedures: No procedures performed   Clinical Data: No additional findings.   Subjective: Chief Complaint  Patient presents with   Right Shoulder - Pain   Left Shoulder - Pain    HPI  Julie Miranda is a 62 year old female who I have not seen a couple years and comes in for evaluation of bilateral shoulder pain for about 2 years.  She denies any injuries.  She has been seeing [facility] and she has done physical therapy and has had subacromial injections.  These have all been ineffective.  She has difficulty with ADLs.  Currently is using over-the-counter medications as well as ice patches.  Review of Systems  Constitutional: Negative.   HENT: Negative.    Eyes: Negative.   Respiratory: Negative.    Cardiovascular: Negative.   Endocrine: Negative.   Musculoskeletal: Negative.   Neurological: Negative.   Hematological: Negative.   Psychiatric/Behavioral: Negative.    All other systems reviewed and are negative.    Objective: Vital Signs: LMP  (LMP Unknown) Comment: pt. states that it has been over 1 year  Physical Exam Vitals and nursing note reviewed.   Constitutional:      Appearance: She is well-developed.  HENT:     Head: Atraumatic.     Nose: Nose normal.  Eyes:     Extraocular Movements: Extraocular movements intact.  Cardiovascular:     Pulses: Normal pulses.  Pulmonary:     Effort: Pulmonary effort is normal.  Abdominal:     Palpations: Abdomen is soft.  Musculoskeletal:     Cervical back: Neck supple.  Skin:    General: Skin is warm.     Capillary Refill: Capillary refill takes less than 2 seconds.  Neurological:     Mental Status: She is alert. Mental status is at baseline.  Psychiatric:        Behavior: Behavior normal.        Thought Content: Thought content normal.        Judgment: Judgment normal.     Ortho Exam  Examination of the right shoulder shows normal range of motion with mild pain.  Pain with impingement testing.  Manual muscle testing is limited secondary to poor effort and guarding.  Examination of the left shoulder is very limited due to extreme guarding due to pain.  Range of motion is limited secondary to guarding.  Specialty Comments:  No specialty comments available.  Imaging: No results found.   PMFS History: Patient Active Problem List   Diagnosis  Date Noted   Vitamin D deficiency 12/29/2020   Chronic migraine w/o aura w/o status migrainosus, not intractable 12/16/2020   Iron deficiency anemia 12/01/2020   Status post total left knee replacement 07/30/2019   Primary osteoarthritis of left knee 06/05/2019   Carpal tunnel syndrome 04/25/2017   Cervicalgia 07/30/2016   Synovitis of shoulder 05/21/2016   Bilateral shoulder pain 03/08/2016   Pure hypercholesterolemia 03/03/2016   Generalized anxiety disorder 03/01/2016   De Quervain's tenosynovitis, bilateral 07/23/2015   Gastroesophageal reflux disease 10/30/2013   Endometrial mass 10/04/2013   Hypothyroidism 06/27/2013   Arthritis 06/27/2013   Essential hypertension, benign 06/27/2013   Disc disorder/L4-5 prolapse 06/27/2013    Chronic lower back pain 06/27/2013   Past Medical History:  Diagnosis Date   Acute low back pain due to trauma 06/28/2013   Acute renal failure 06/27/2013   Anemia    Anemia, unspecified 06/28/2013   Anxiety    Arthritis    Chronic pain of left knee 06/05/2019   Class 2 severe obesity due to excess calories with serious comorbidity and body mass index (BMI) of 37.0 to 37.9 in adult 07/08/2020   Colon polyps    x 3   Costochondritis 03/01/2016   Dehydration 06/28/2013   Depression    Edema of both lower extremities    Family history of adverse reaction to anesthesia    makes mother" loopy"   Gastric ulcer    GERD (gastroesophageal reflux disease)    Graves disease    History of pancytopenia    of unknown etiology    Hyperlipidemia    Hypertension    Hypokalemia 06/27/2013   Hypothyroidism    IBS (irritable bowel syndrome)    Iron deficiency 12/16/2020   Migraine    Neutropenia 07/11/2015   Osteoarthritis    Syncope and collapse 06/27/2013   Thyroid disease    Vitamin D deficiency     Family History  Problem Relation Age of Onset   Hypertension Mother    Gout Mother    Diabetes Mother    High Cholesterol Mother    Anxiety disorder Mother    Obesity Mother    Stroke Father    Hypertension Sister    Alcohol abuse Sister    Stroke Sister    Pancreatic cancer Sister    Diabetes Maternal Aunt        x 2   Liver cancer Maternal Aunt    Alcohol abuse Maternal Grandfather    Early death Maternal Grandfather    Alcohol abuse Paternal Grandmother    Early death Paternal Grandmother    Colon cancer Neg Hx    Kidney disease Neg Hx    Colon polyps Neg Hx    Esophageal cancer Neg Hx    Rectal cancer Neg Hx    Stomach cancer Neg Hx     Past Surgical History:  Procedure Laterality Date   COLONOSCOPY     GASTRIC BYPASS     JOINT REPLACEMENT N/A    Phreesia 06/12/2020   POLYPECTOMY     TOTAL KNEE ARTHROPLASTY Left 07/30/2019   Procedure: LEFT TOTAL KNEE  ARTHROPLASTY;  Surgeon: Tarry Kos, MD;  Location: [facility] OR;  Service: Orthopedics;  Laterality: Left;   Social History   Occupational History   Occupation: Psychologist, sport and exercise  Tobacco Use   Smoking status: Never   Smokeless tobacco: Never  Vaping Use   Vaping Use: Never used  Substance and Sexual Activity   Alcohol use: Yes  Alcohol/week: 2.0 standard drinks of alcohol    Types: 2 Standard drinks or equivalent per week    Comment: occasional   Drug use: No   Sexual activity: Not Currently

## 2023-02-12 LAB — SEDIMENTATION RATE: Sed Rate: 22 mm/h (ref 0–30)

## 2023-02-12 LAB — URIC ACID: Uric Acid, Serum: 3.2 mg/dL (ref 2.5–7.0)

## 2023-02-12 LAB — ANA: Anti Nuclear Antibody (ANA): NEGATIVE

## 2023-02-12 LAB — RHEUMATOID FACTOR: Rheumatoid fact SerPl-aCnc: <10 IU/mL (ref <14)

## 2023-02-15 ENCOUNTER — Other Ambulatory Visit (HOSPITAL_COMMUNITY): Payer: Self-pay

## 2023-02-16 ENCOUNTER — Encounter: Payer: Self-pay | Admitting: Physician Assistant

## 2023-02-16 ENCOUNTER — Other Ambulatory Visit (HOSPITAL_COMMUNITY): Payer: Self-pay

## 2023-02-16 ENCOUNTER — Ambulatory Visit: Payer: Commercial Managed Care - PPO | Admitting: Family

## 2023-02-21 ENCOUNTER — Telehealth: Payer: Commercial Managed Care - PPO | Admitting: Neurology

## 2023-02-22 ENCOUNTER — Other Ambulatory Visit (HOSPITAL_COMMUNITY): Payer: Self-pay

## 2023-02-27 ENCOUNTER — Ambulatory Visit
Admission: RE | Admit: 2023-02-27 | Discharge: 2023-02-27 | Disposition: A | Discharge status: Home / Self Care | Payer: Commercial Managed Care - PPO | Source: Ambulatory Visit | Attending: Orthopaedic Surgery | Admitting: Orthopaedic Surgery

## 2023-02-27 DIAGNOSIS — M25519 Pain in unspecified shoulder: Secondary | ICD-10-CM | POA: Diagnosis not present

## 2023-02-27 DIAGNOSIS — R531 Weakness: Secondary | ICD-10-CM | POA: Diagnosis not present

## 2023-02-27 DIAGNOSIS — G8929 Other chronic pain: Secondary | ICD-10-CM

## 2023-03-01 ENCOUNTER — Telehealth: Payer: Commercial Managed Care - PPO | Admitting: Neurology

## 2023-03-03 ENCOUNTER — Ambulatory Visit (INDEPENDENT_AMBULATORY_CARE_PROVIDER_SITE_OTHER): Payer: Commercial Managed Care - PPO | Admitting: Orthopaedic Surgery

## 2023-03-03 DIAGNOSIS — G8929 Other chronic pain: Secondary | ICD-10-CM

## 2023-03-03 DIAGNOSIS — M25512 Pain in left shoulder: Secondary | ICD-10-CM | POA: Diagnosis not present

## 2023-03-03 DIAGNOSIS — M7542 Impingement syndrome of left shoulder: Secondary | ICD-10-CM

## 2023-03-03 DIAGNOSIS — M25511 Pain in right shoulder: Secondary | ICD-10-CM

## 2023-03-03 NOTE — Progress Notes (Signed)
Office Visit Note   Patient: Julie Miranda           Date of Birth: 1961/01/22           MRN: [id number] Visit Date: 03/03/2023              Requested by: Dulce Sellar, NP [address] [place],  [place] [address] PCP: Dulce Sellar, NP   Assessment & Plan: Visit Diagnoses:  1. Impingement syndrome of left shoulder   2. Chronic pain of both shoulders     Plan: MRI findings of both shoulders were reviewed with Julie Miranda in detail.  Treatment options were discussed.  She has had several injections in the past without significant relief.  She will think about her options and let me know.  Follow-Up Instructions: No follow-ups on file.   Orders:  No orders of the defined types were placed in this encounter.  No orders of the defined types were placed in this encounter.     Procedures: No procedures performed   Clinical Data: No additional findings.   Subjective: Chief Complaint  Patient presents with   Right Shoulder - Pain   Left Shoulder - Pain    HPI Julie Miranda returns today to discuss bilateral shoulder MRI scans. Review of Systems   Objective: Vital Signs: LMP  (LMP Unknown) Comment: pt. states that it has been over 1 year  Physical Exam  Ortho Exam Examination of both shoulders unchanged. Specialty Comments:  No specialty comments available.  Imaging: No results found.   PMFS History: Patient Active Problem List   Diagnosis Date Noted   Vitamin D deficiency 12/29/2020   Chronic migraine w/o aura w/o status migrainosus, not intractable 12/16/2020   Iron deficiency anemia 12/01/2020   Status post total left knee replacement 07/30/2019   Primary osteoarthritis of left knee 06/05/2019   Carpal tunnel syndrome 04/25/2017   Cervicalgia 07/30/2016   Synovitis of shoulder 05/21/2016   Bilateral shoulder pain 03/08/2016   Pure hypercholesterolemia 03/03/2016   Generalized anxiety disorder 03/01/2016   De Quervain's tenosynovitis,  bilateral 07/23/2015   Gastroesophageal reflux disease 10/30/2013   Endometrial mass 10/04/2013   Hypothyroidism 06/27/2013   Arthritis 06/27/2013   Essential hypertension, benign 06/27/2013   Disc disorder/L4-5 prolapse 06/27/2013   Chronic lower back pain 06/27/2013   Past Medical History:  Diagnosis Date   Acute low back pain due to trauma 06/28/2013   Acute renal failure (HCC) 06/27/2013   Anemia    Anemia, unspecified 06/28/2013   Anxiety    Arthritis    Chronic pain of left knee 06/05/2019   Class 2 severe obesity due to excess calories with serious comorbidity and body mass index (BMI) of 37.0 to 37.9 in adult [facility]) 07/08/2020   Colon polyps    x 3   Costochondritis 03/01/2016   Dehydration 06/28/2013   Depression    Edema of both lower extremities    Family history of adverse reaction to anesthesia    makes mother" loopy"   Gastric ulcer    GERD (gastroesophageal reflux disease)    Graves disease    History of pancytopenia    of unknown etiology    Hyperlipidemia    Hypertension    Hypokalemia 06/27/2013   Hypothyroidism    IBS (irritable bowel syndrome)    Iron deficiency 12/16/2020   Migraine    Neutropenia (HCC) 07/11/2015   Osteoarthritis    Syncope and collapse 06/27/2013   Thyroid disease    Vitamin  D deficiency     Family History  Problem Relation Age of Onset   Hypertension Mother    Gout Mother    Diabetes Mother    High Cholesterol Mother    Anxiety disorder Mother    Obesity Mother    Stroke Father    Hypertension Sister    Alcohol abuse Sister    Stroke Sister    Pancreatic cancer Sister    Diabetes Maternal Aunt        x 2   Liver cancer Maternal Aunt    Alcohol abuse Maternal Grandfather    Early death Maternal Grandfather    Alcohol abuse Paternal Grandmother    Early death Paternal Grandmother    Colon cancer Neg Hx    Kidney disease Neg Hx    Colon polyps Neg Hx    Esophageal cancer Neg Hx    Rectal cancer Neg Hx     Stomach cancer Neg Hx     Past Surgical History:  Procedure Laterality Date   COLONOSCOPY     GASTRIC BYPASS     JOINT REPLACEMENT N/A    Phreesia 06/12/2020   POLYPECTOMY     TOTAL KNEE ARTHROPLASTY Left 07/30/2019   Procedure: LEFT TOTAL KNEE ARTHROPLASTY;  Surgeon: Tarry Kos, MD;  Location: [facility] OR;  Service: Orthopedics;  Laterality: Left;   Social History   Occupational History   Occupation: Psychologist, sport and exercise  Tobacco Use   Smoking status: Never   Smokeless tobacco: Never  Vaping Use   Vaping Use: Never used  Substance and Sexual Activity   Alcohol use: Yes    Alcohol/week: 2.0 standard drinks of alcohol    Types: 2 Standard drinks or equivalent per week    Comment: occasional   Drug use: No   Sexual activity: Not Currently

## 2023-03-09 ENCOUNTER — Other Ambulatory Visit (HOSPITAL_COMMUNITY): Payer: Self-pay

## 2023-03-24 ENCOUNTER — Other Ambulatory Visit (HOSPITAL_COMMUNITY): Payer: Self-pay

## 2023-03-24 ENCOUNTER — Telehealth: Payer: Self-pay

## 2023-03-24 NOTE — Telephone Encounter (Signed)
Patient Advocate Encounter   Received notification from [employer] that prior authorization is required for Qulipta 60MG  tablets   Submitted: 03-24-2023 Key [id number]  Status is pending

## 2023-03-29 ENCOUNTER — Other Ambulatory Visit (HOSPITAL_COMMUNITY): Payer: Self-pay

## 2023-03-29 NOTE — Telephone Encounter (Signed)
Pharmacy Patient Advocate Encounter  Prior Authorization for Qulipta 60MG  tablets has been APPROVED by [facility] from 03/24/2023 to 03/22/2024.   PA # PA Case ID #: [id number]  Copay is $0 for 30 Tabs/ 30DS per [facility] test claim.

## 2023-04-03 ENCOUNTER — Other Ambulatory Visit: Payer: Self-pay | Admitting: Family Medicine

## 2023-04-03 ENCOUNTER — Other Ambulatory Visit: Payer: Self-pay | Admitting: Registered Nurse

## 2023-04-03 ENCOUNTER — Other Ambulatory Visit (HOSPITAL_COMMUNITY): Payer: Self-pay

## 2023-04-03 DIAGNOSIS — I10 Essential (primary) hypertension: Secondary | ICD-10-CM

## 2023-04-03 DIAGNOSIS — E7800 Pure hypercholesterolemia, unspecified: Secondary | ICD-10-CM

## 2023-04-03 DIAGNOSIS — K219 Gastro-esophageal reflux disease without esophagitis: Secondary | ICD-10-CM

## 2023-04-03 DIAGNOSIS — H6690 Otitis media, unspecified, unspecified ear: Secondary | ICD-10-CM

## 2023-04-04 ENCOUNTER — Other Ambulatory Visit: Payer: Self-pay

## 2023-04-04 ENCOUNTER — Other Ambulatory Visit (HOSPITAL_COMMUNITY): Payer: Self-pay

## 2023-04-08 ENCOUNTER — Other Ambulatory Visit (HOSPITAL_COMMUNITY): Payer: Self-pay

## 2023-04-11 ENCOUNTER — Ambulatory Visit: Payer: Commercial Managed Care - PPO | Admitting: Family

## 2023-04-12 ENCOUNTER — Other Ambulatory Visit (HOSPITAL_COMMUNITY): Payer: Self-pay

## 2023-04-12 MED ORDER — ALBUTEROL SULFATE HFA 108 (90 BASE) MCG/ACT IN AERS
2.0000 | INHALATION_SPRAY | RESPIRATORY_TRACT | 3 refills | Status: DC | PRN
  Filled 2023-04-12: qty 6.7, 25d supply, fill #0

## 2023-04-12 MED ORDER — FLUTICASONE PROPIONATE 50 MCG/ACT NA SUSP
1.0000 | Freq: Every day | NASAL | 3 refills | Status: DC
  Filled 2023-04-12: qty 16, 30d supply, fill #0

## 2023-04-13 ENCOUNTER — Other Ambulatory Visit (HOSPITAL_COMMUNITY): Payer: Self-pay

## 2023-04-25 ENCOUNTER — Other Ambulatory Visit (HOSPITAL_COMMUNITY): Payer: Self-pay

## 2023-04-26 ENCOUNTER — Telehealth: Payer: Commercial Managed Care - PPO | Admitting: Neurology

## 2023-04-26 ENCOUNTER — Other Ambulatory Visit (HOSPITAL_COMMUNITY): Payer: Self-pay

## 2023-05-03 ENCOUNTER — Ambulatory Visit: Payer: Commercial Managed Care - PPO | Admitting: Neurology

## 2023-05-03 ENCOUNTER — Other Ambulatory Visit: Payer: Self-pay

## 2023-05-03 ENCOUNTER — Encounter: Payer: Self-pay | Admitting: Neurology

## 2023-05-03 ENCOUNTER — Other Ambulatory Visit (HOSPITAL_COMMUNITY): Payer: Self-pay

## 2023-05-03 VITALS — BP 132/72 | HR 87 | Ht 66.0 in | Wt 208.0 lb

## 2023-05-03 DIAGNOSIS — G43709 Chronic migraine without aura, not intractable, without status migrainosus: Secondary | ICD-10-CM | POA: Diagnosis not present

## 2023-05-03 DIAGNOSIS — R110 Nausea: Secondary | ICD-10-CM

## 2023-05-03 MED ORDER — ELETRIPTAN HYDROBROMIDE 20 MG PO TABS
20.0000 mg | ORAL_TABLET | ORAL | 11 refills | Status: AC | PRN
  Filled 2023-05-03: qty 10, 5d supply, fill #0
  Filled 2023-05-16 – 2023-08-05 (×2): qty 10, 30d supply, fill #0
  Filled 2024-04-26: qty 6, 30d supply, fill #0

## 2023-05-03 MED ORDER — ONABOTULINUMTOXINA 200 UNITS IJ SOLR
155.00 [IU] | Freq: Once | INTRAMUSCULAR | Status: AC
Start: 2023-05-03 — End: 2023-05-03
  Administered 2023-05-03: 155 [IU] via INTRAMUSCULAR

## 2023-05-03 MED ORDER — ONDANSETRON HCL 4 MG PO TABS
4.0000 mg | ORAL_TABLET | Freq: Three times a day (TID) | ORAL | 2 refills | Status: DC | PRN
Start: 2023-05-03 — End: 2023-12-20
  Filled 2023-05-03 – 2023-08-05 (×2): qty 20, 7d supply, fill #0

## 2023-05-03 NOTE — Progress Notes (Signed)
BOTOX PROCEDURE NOTE FOR MIGRAINE HEADACHE  HISTORY: Here today for her first Botox injection. Remains on Qulipta, not longer severe migraines, But reports daily migraine currently, 7/10. Still on Topamax 100 mg BID. Still gets nauseated with migraines, not longer vomits. Reports Bernita Raisin made her sick.   Previously tried and failed: Qulipta, Aimovig, Topamax, Nurtec, nortriptyline, Effexor, Maxalt, Imitrex, Ubrelvy.  Relpax was not covered by insurance.   Description of procedure:  The patient was placed in a sitting position. The standard protocol was used for Botox as follows, with 5 units of Botox injected at each site:  -Procerus muscle, midline injection  -Corrugator muscle, bilateral injection  -Frontalis muscle, bilateral injection, with 2 sites each side, medial injection was performed in the upper one third of the frontalis muscle, in the region vertical from the medial inferior edge of the superior orbital rim. The lateral injection was again in the upper one third of the forehead vertically above the lateral limbus of the cornea, 1.5 cm lateral to the medial injection site.  -Temporalis muscle injection, 4 sites, bilaterally. The first injection was 3 cm above the tragus of the ear, second injection site was 1.5 cm to 3 cm up from the first injection site in line with the tragus of the ear. The third injection site was 1.5-3 cm forward between the first 2 injection sites. The fourth injection site was 1.5 cm posterior to the second injection site.  -Occipitalis muscle injection, 3 sites, bilaterally. The first injection was done one half way between the occipital protuberance and the tip of the mastoid process behind the ear. The second injection site was done lateral and superior to the first, 1 fingerbreadth from the first injection. The third injection site was 1 fingerbreadth superiorly and medially from the first injection site.  -Cervical paraspinal muscle injection, 2 sites,  bilateral, the first injection site was 1 cm from the midline of the cervical spine, 3 cm inferior to the lower border of the occipital protuberance. The second injection site was 1.5 cm superiorly and laterally to the first injection site.  -Trapezius muscle injection was performed at 3 sites, bilaterally. The first injection site was in the upper trapezius muscle halfway between the inflection point of the neck, and the acromion. The second injection site was one half way between the acromion and the first injection site. The third injection was done between the first injection site and the inflection point of the neck.  A 200 unit bottle of Botox was used, 155 units were injected, the rest of the Botox was wasted. The patient tolerated the procedure well, there were no complications of the above procedure.  Botox NDC [phone] Lot number [id number] Expiration date 03/2025 SP  Meds ordered this encounter  Medications   botulinum toxin Type A (BOTOX) injection 155 Units    Botox- 200 units x 1 vial Lot: [id number] Expiration: 2026/06 NDC: 0023-3921-02  Bacteriostatic 0.9% Sodium Chloride- 4mL  Lot: [id number] Expiration: 01/24/24 NDC: 5784-6962-95  Dx: M84.132 S/P  Witnessed by Memory Dance RN   eletriptan (RELPAX) 20 MG tablet    Sig: Take 1 tablet (20 mg total) by mouth as needed for migraine or headache. May repeat in 2 hours if headache persists or recurs.    Dispense:  10 tablet    Refill:  11   ondansetron (ZOFRAN) 4 MG tablet    Sig: Take 1 tablet (4 mg total) by mouth every 8 (eight) hours as needed for nausea or vomiting.  Dispense:  20 tablet    Refill:  2   I sent in Relpax to try as rescue.  See above tried and failed.  Refilled Zofran.  She will continue [place].

## 2023-05-03 NOTE — Progress Notes (Signed)
Botox- 200 units x 1 vial Lot: [id number] Expiration: 2026/06 NDC: 0023-3921-02  Bacteriostatic 0.9% Sodium Chloride- 4mL  Lot: [id number] Expiration: 01/24/24 NDC: 1191-4782-95  Dx: A21.308 S/P  Witnessed by Memory Dance RN

## 2023-05-12 ENCOUNTER — Other Ambulatory Visit (HOSPITAL_COMMUNITY): Payer: Self-pay

## 2023-05-16 ENCOUNTER — Other Ambulatory Visit (HOSPITAL_COMMUNITY): Payer: Self-pay

## 2023-05-16 ENCOUNTER — Other Ambulatory Visit: Payer: Self-pay | Admitting: Family

## 2023-05-16 ENCOUNTER — Other Ambulatory Visit: Payer: Self-pay | Admitting: Family Medicine

## 2023-05-16 DIAGNOSIS — I10 Essential (primary) hypertension: Secondary | ICD-10-CM

## 2023-05-17 ENCOUNTER — Other Ambulatory Visit (HOSPITAL_COMMUNITY): Payer: Self-pay

## 2023-05-23 ENCOUNTER — Telehealth: Payer: Self-pay | Admitting: Family

## 2023-05-23 NOTE — Telephone Encounter (Signed)
Prescription Request  05/23/2023  LOV: 02/04/2023  What is the name of the medication or equipment?  losartan (COZAAR) 25 MG tablet   Have you contacted your pharmacy to request a refill? Yes   Which pharmacy would you like this sent to?  [facility] [address] [place] [address] Phone: [phone] Fax: [phone]    Patient notified that their request is being sent to the clinical staff for review and that they should receive a response within 2 business days.   Please advise at Mobile [phone] (mobile)

## 2023-05-27 ENCOUNTER — Other Ambulatory Visit: Payer: Self-pay

## 2023-05-27 ENCOUNTER — Other Ambulatory Visit: Payer: Self-pay | Admitting: Family

## 2023-05-27 DIAGNOSIS — I10 Essential (primary) hypertension: Secondary | ICD-10-CM

## 2023-05-30 ENCOUNTER — Other Ambulatory Visit (HOSPITAL_COMMUNITY): Payer: Self-pay

## 2023-05-31 ENCOUNTER — Other Ambulatory Visit (HOSPITAL_COMMUNITY): Payer: Self-pay

## 2023-06-02 ENCOUNTER — Encounter: Payer: Self-pay | Admitting: Family

## 2023-06-02 ENCOUNTER — Ambulatory Visit (INDEPENDENT_AMBULATORY_CARE_PROVIDER_SITE_OTHER): Payer: Commercial Managed Care - PPO | Admitting: Family

## 2023-06-02 ENCOUNTER — Other Ambulatory Visit (HOSPITAL_COMMUNITY): Payer: Self-pay

## 2023-06-02 VITALS — BP 162/92 | HR 89 | Temp 97.8°F | Ht 66.0 in | Wt 211.2 lb

## 2023-06-02 DIAGNOSIS — I10 Essential (primary) hypertension: Secondary | ICD-10-CM

## 2023-06-02 DIAGNOSIS — F411 Generalized anxiety disorder: Secondary | ICD-10-CM | POA: Diagnosis not present

## 2023-06-02 DIAGNOSIS — E039 Hypothyroidism, unspecified: Secondary | ICD-10-CM | POA: Diagnosis not present

## 2023-06-02 DIAGNOSIS — E782 Mixed hyperlipidemia: Secondary | ICD-10-CM | POA: Diagnosis not present

## 2023-06-02 LAB — LIPID PANEL
Cholesterol: 223 mg/dL — ABNORMAL HIGH (ref 0–200)
HDL: 72.9 mg/dL (ref >39.00)
LDL Cholesterol: 129 mg/dL — ABNORMAL HIGH (ref 0–99)
NonHDL: 150.21
Total CHOL/HDL Ratio: 3
Triglycerides: 105 mg/dL (ref 0.0–149.0)
VLDL: 21 mg/dL (ref 0.0–40.0)

## 2023-06-02 LAB — COMPREHENSIVE METABOLIC PANEL
ALT: 10 U/L (ref 0–35)
AST: 19 U/L (ref 0–37)
Albumin: 3.9 g/dL (ref 3.5–5.2)
Alkaline Phosphatase: 86 U/L (ref 39–117)
BUN: 7 mg/dL (ref 6–23)
CO2: 27 mEq/L (ref 19–32)
Calcium: 9.2 mg/dL (ref 8.4–10.5)
Chloride: 108 mEq/L (ref 96–112)
Creatinine, Ser: 0.78 mg/dL (ref 0.40–1.20)
GFR: 82.04 mL/min (ref >60.00)
Glucose, Bld: 82 mg/dL (ref 70–99)
Potassium: 4 mEq/L (ref 3.5–5.1)
Sodium: 142 mEq/L (ref 135–145)
Total Bilirubin: 0.4 mg/dL (ref 0.2–1.2)
Total Protein: 6.5 g/dL (ref 6.0–8.3)

## 2023-06-02 LAB — T4, FREE: Free T4: 0.5 ng/dL — ABNORMAL LOW (ref 0.60–1.60)

## 2023-06-02 LAB — TSH: TSH: 8.75 u[IU]/mL — ABNORMAL HIGH (ref 0.35–5.50)

## 2023-06-02 MED ORDER — LOSARTAN POTASSIUM-HCTZ 50-12.5 MG PO TABS
1.0000 | ORAL_TABLET | Freq: Every morning | ORAL | 1 refills | Status: DC
Start: 2023-06-02 — End: 2023-09-13
  Filled 2023-06-02 – 2023-06-14 (×2): qty 90, 90d supply, fill #0

## 2023-06-02 MED ORDER — ATORVASTATIN CALCIUM 40 MG PO TABS
40.0000 mg | ORAL_TABLET | ORAL | 1 refills | Status: DC
Start: 2023-06-02 — End: 2023-06-05
  Filled 2023-06-02: qty 45, 90d supply, fill #0

## 2023-06-02 MED ORDER — BUSPIRONE HCL 10 MG PO TABS
10.0000 mg | ORAL_TABLET | Freq: Two times a day (BID) | ORAL | 1 refills | Status: AC
  Filled 2023-06-02 – 2023-06-14 (×2): qty 180, 90d supply, fill #0
  Filled 2024-02-14: qty 180, 90d supply, fill #1

## 2023-06-02 NOTE — Assessment & Plan Note (Addendum)
chronic, non-compliant taking Levothyroxine but not daily, took today, not yesterday reports wt gain, mood changes, feels hot all the time checking labs today sending refill, but again reminding pt the purpose of the labs is to check if med dose is correct, has to be taking the med daily prior to labs f/u 6 mos

## 2023-06-02 NOTE — Progress Notes (Signed)
Patient ID: Julie Miranda, female    DOB: 1960-11-07, 62 y.o.   MRN: [id number]  Chief Complaint  Patient presents with   Hypertension    Follow up    HPI: Hypertension: Patient is currently maintained on the following medications for blood pressure: Losartan 25mg , HCTZ 12.5mg  - pt ran out of medicine Failed meds include: none Patient reports good compliance with blood pressure medications. Patient denies chest pain, headaches, shortness of breath or swelling. Last 3 blood pressure readings in our office are as follows: BP Readings from Last 3 Encounters:  06/02/23 (!) 162/92  05/03/23 132/72  02/04/23 125/75  Hypothyroidism: Patient presents today for followup of Hypothyroidism.  Patient reports positive compliance with daily medication, Levo qd. pt is overdue for lab work, dose increased w/last lab work.Patient denies any of the following symptoms: cold intolerance, constipation,  muscle weakness, mental slowing. Pt reports feeling hot all the time, fatigue, increased anxiety, & dry hair and skin.  Anxiety:  pt reports taking Celexa 40mg  for a few years also has Buspirone 10mg  but only taking as needed. States her anxiety is worse, celexa no longer working.  Stopped the Celexa and taking Buspirone prn, reports she has appt with new psych provider but has not had time to go yet. Hyperlipidemia: Patient is currently maintained on the following medication for hyperlipidemia: Lipitor 40mg ? . Patient denies myalgia or other side effects. Patient reports good compliance with low fat/low cholesterol diet.  Last lipid panel as follows: Lab Results  Component Value Date   CHOL 233 (H) 03/04/2022   HDL 94.60 03/04/2022   LDLCALC 117 (H) 03/04/2022   TRIG 104.0 03/04/2022   CHOLHDL 2 03/04/2022    Assessment & Plan:  Generalized anxiety disorder Assessment & Plan: chronic, Unstable stopped Celexa last visit, taking Buspar 10mg  prn states her anxiety is uncontrolled still,  rocking back and forth in chair, referral sent last visit, pt reports she is too busy to take off work to see somebody advised pt the toxic affects of stress on the body, she needs to schedule appt denies needing refills today f/u prn  Orders: -     busPIRone HCl; Take 1 tablet (10 mg total) by mouth 2 (two) times daily. For anxiety.  Dispense: 180 tablet; Refill: 1  Acquired hypothyroidism Assessment & Plan: chronic, non-compliant taking Levothyroxine but not daily, took today, not yesterday reports wt gain, mood changes, feels hot all the time checking labs today sending refill, but again reminding pt the purpose of the labs is to check if med dose is correct, has to be taking the med daily prior to labs f/u 6 mos  Orders: -     T4, free -     TSH  Essential hypertension, benign Assessment & Plan: chronic, Unstable BP high today, non-compliant, reports increased stress, works 12h days 5d per week taking Losartan 25mg  & HCTZ 12.5mg  but ran out of meds again d/t not able to get off of work for appt checking labs today sending refill for Losartan 50mg -12.5 HCTZ f/u 73m  Orders: -     Comprehensive metabolic panel -     Losartan Potassium-HCTZ; Take 1 tablet by mouth in the morning.  Dispense: 90 tablet; Refill: 1  Mixed hyperlipidemia Assessment & Plan: chronic pt states she is taking Lipitor 40mg  daily labs 02/2022 - TC 233; LDL 117 rechecking today sending Lipitor refill f/u in 1 year  Orders: -     Atorvastatin Calcium; Take 1  tablet (40 mg total) by mouth every other day.  Dispense: 90 tablet; Refill: 1 -     Lipid panel   Subjective:    Outpatient Medications Prior to Visit  Medication Sig Dispense Refill   albuterol (VENTOLIN HFA) 108 (90 Base) MCG/ACT inhaler Inhale 2 puffs into the lungs every 4 (four) hours as needed for shortness of breath 6.7 g 0   Atogepant (QULIPTA) 60 MG TABS Take 1 tablet by mouth daily. 30 tablet 11   azelastine (ASTELIN) 0.1  % nasal spray Place 1 spray into both nostrils 2 (two) times daily as directed 30 mL 12   botulinum toxin Type A (BOTOX) 200 units injection Inject 200 Units into the muscle every 3 (three) months. 1 each 2   eletriptan (RELPAX) 20 MG tablet Take 1 tablet (20 mg total) by mouth as needed for migraine or headache. May repeat in 2 hours if headache persists or recurs. 10 tablet 11   gabapentin (NEURONTIN) 100 MG capsule Take 3 capsules (300 mg total) by mouth at bedtime. 180 capsule 5   levothyroxine (SYNTHROID) 137 MCG tablet Take 1 tablet (137 mcg total) by mouth daily before breakfast. 90 tablet 1   ondansetron (ZOFRAN) 4 MG tablet Take 1 tablet (4 mg total) by mouth every 8 (eight) hours as needed for nausea or vomiting. 20 tablet 2   pantoprazole (PROTONIX) 40 MG tablet Take 1 tablet (40 mg total) by mouth daily. 90 tablet 3   tiZANidine (ZANAFLEX) 4 MG tablet Take 1 tablet (4 mg total) by mouth every 6 (six) hours as needed for muscle spasms. 30 tablet 6   topiramate (TOPAMAX) 100 MG tablet Take 1 tablet (100 mg total) by mouth 2 (two) times daily. 60 tablet 11   albuterol (VENTOLIN HFA) 108 (90 Base) MCG/ACT inhaler Inhale 2 puffs into the lungs every 4 (four) hours as needed for shortness of breath 6.7 g 3   atorvastatin (LIPITOR) 40 MG tablet Take 1 tablet (40 mg total) by mouth every other day. 90 tablet 1   citalopram (CELEXA) 20 MG tablet Take 1.5 tablets (30 mg total) by mouth daily. (Patient taking differently: Take 40 mg by mouth daily.) 145 tablet 1   cyclobenzaprine (FLEXERIL) 10 MG tablet Take 1 tablet (10 mg total) by mouth at bedtime. 30 tablet 0   fluticasone (FLONASE) 50 MCG/ACT nasal spray Place 1 spray into both nostrils daily. 16 g 0   fluticasone (FLONASE) 50 MCG/ACT nasal spray Place 1 spray into both nostrils daily. 16 g 3   hydrochlorothiazide (HYDRODIURIL) 12.5 MG tablet Take 1 tablet (12.5 mg total) by mouth daily. 90 tablet 3   losartan (COZAAR) 25 MG tablet Take 1  tablet (25 mg total) by mouth daily. 90 tablet 1   No facility-administered medications prior to visit.   Past Medical History:  Diagnosis Date   Acute low back pain due to trauma 06/28/2013   Acute renal failure (HCC) 06/27/2013   Anemia    Anemia, unspecified 06/28/2013   Anxiety    Arthritis    Carpal tunnel syndrome 04/25/2017   Chronic pain of left knee 06/05/2019   Class 2 severe obesity due to excess calories with serious comorbidity and body mass index (BMI) of 37.0 to 37.9 in adult [facility]) 07/08/2020   Colon polyps    x 3   Costochondritis 03/01/2016   De Quervain's tenosynovitis, bilateral 07/23/2015   Dehydration 06/28/2013   Depression    Edema of both lower extremities  Family history of adverse reaction to anesthesia    makes mother" loopy"   Gastric ulcer    GERD (gastroesophageal reflux disease)    Graves disease    History of pancytopenia    of unknown etiology    Hyperlipidemia    Hypertension    Hypokalemia 06/27/2013   Hypothyroidism    IBS (irritable bowel syndrome)    Iron deficiency 12/16/2020   Migraine    Neutropenia (HCC) 07/11/2015   Osteoarthritis    Pure hypercholesterolemia 03/03/2016   Syncope and collapse 06/27/2013   Synovitis of shoulder 05/21/2016   Thyroid disease    Vitamin D deficiency    Past Surgical History:  Procedure Laterality Date   COLONOSCOPY     GASTRIC BYPASS     JOINT REPLACEMENT N/A    [employer] 06/12/2020   POLYPECTOMY     TOTAL KNEE ARTHROPLASTY Left 07/30/2019   Procedure: LEFT TOTAL KNEE ARTHROPLASTY;  Surgeon: Tarry Kos, MD;  Location: [facility] OR;  Service: Orthopedics;  Laterality: Left;   Allergies  Allergen Reactions   Molds & Smuts Shortness Of Breath   Morphine And Codeine Nausea And Vomiting      Objective:    Physical Exam Vitals and nursing note reviewed.  Constitutional:      Appearance: Normal appearance. She is obese.  Cardiovascular:     Rate and Rhythm: Normal rate and regular rhythm.   Pulmonary:     Effort: Pulmonary effort is normal.     Breath sounds: Normal breath sounds.  Musculoskeletal:        General: Normal range of motion.  Skin:    General: Skin is warm and dry.  Neurological:     Mental Status: She is alert.  Psychiatric:        Mood and Affect: Mood normal.        Behavior: Behavior normal.    BP (!) 162/92   Pulse 89   Temp 97.8 F (36.6 C) (Temporal)   Ht 5\' 6"  (1.676 m)   Wt 211 lb 3.2 oz (95.8 kg)   LMP  (LMP Unknown) Comment: pt. states that it has been over 1 year  SpO2 100%   BMI 34.09 kg/m  Wt Readings from Last 3 Encounters:  06/02/23 211 lb 3.2 oz (95.8 kg)  05/03/23 208 lb (94.3 kg)  02/04/23 206 lb 12.8 oz (93.8 kg)       Dulce Sellar, NP

## 2023-06-02 NOTE — Assessment & Plan Note (Signed)
chronic, Unstable BP high today, non-compliant, reports increased stress, works 12h days 5d per week taking Losartan 25mg  & HCTZ 12.5mg  but ran out of meds again d/t not able to get off of work for appt checking labs today sending refill for Losartan 50mg -12.5 HCTZ f/u 78m

## 2023-06-02 NOTE — Assessment & Plan Note (Signed)
chronic pt states she is taking Lipitor 40mg  daily labs 02/2022 - TC 233; LDL 117 rechecking today sending Lipitor refill f/u in 1 year

## 2023-06-02 NOTE — Assessment & Plan Note (Signed)
chronic, Unstable stopped Celexa last visit, taking Buspar 10mg  prn states her anxiety is uncontrolled still, rocking back and forth in chair, referral sent last visit, pt reports she is too busy to take off work to see somebody advised pt the toxic affects of stress on the body, she needs to schedule appt denies needing refills today f/u prn

## 2023-06-02 NOTE — Patient Instructions (Addendum)
It was very nice to see you today!   I will review your lab results via MyChart in a few days. I have sent in your refills. You can look at [employer] - [email or web address] and see if this is something you are interested in for weight loss.  If you are interested let me know, but this requires monthly visits for refills. Also check your insurance plan and see if Qsymia is covered.  Please schedule a 6 month follow up for refills.     PLEASE NOTE:  If you had any lab tests please let us know if you have not heard back within a few days. You may see your results on MyChart before we have a chance to review them but we will give you a call once they are reviewed by [place]. If we ordered any referrals today, please let us know if you have not heard from their office within the next week.

## 2023-06-05 ENCOUNTER — Other Ambulatory Visit: Payer: Self-pay | Admitting: Family

## 2023-06-05 ENCOUNTER — Other Ambulatory Visit (HOSPITAL_COMMUNITY): Payer: Self-pay

## 2023-06-05 DIAGNOSIS — E782 Mixed hyperlipidemia: Secondary | ICD-10-CM

## 2023-06-05 DIAGNOSIS — E039 Hypothyroidism, unspecified: Secondary | ICD-10-CM

## 2023-06-05 MED ORDER — ATORVASTATIN CALCIUM 40 MG PO TABS
60.0000 mg | ORAL_TABLET | ORAL | Status: DC
Start: 2023-06-05 — End: 2023-09-13

## 2023-06-05 MED ORDER — LEVOTHYROXINE SODIUM 150 MCG PO TABS
150.0000 ug | ORAL_TABLET | Freq: Every day | ORAL | 0 refills | Status: DC
  Filled 2023-06-05 – 2023-06-17 (×2): qty 90, 90d supply, fill #0

## 2023-06-05 NOTE — Addendum Note (Signed)
Addended byDulce Sellar on: 06/05/2023 09:38 PM   Modules accepted: Orders

## 2023-06-05 NOTE — Progress Notes (Signed)
Your thyroid level is low, but you have missed doses on your Levothyroxine though you said you took it the day of your visit. I have gone ahead and sent in a refill with the next highest dose up to daily. You must follow up in 3 months for labs so I can see if this dose is therapeutic for you - a lab only appointment, non- fasting for 11 weeks, around 08/22/2023.  Your total cholesterol number & LDL (bad #) are high. If you have been taking the Lipitor 40mg  (Atorvastatin) every day, you should increase it to 60mg  which is 1 & 1/2 pills daily. You need a pill cutter from the pharmacy to split accurately.  We can recheck your lipids on same day as thyroid (doesn't have to be fasting)  You also have to reduce any fried foods, fatty meat (red meat), high fat dairy foods.  Any questions let me know.

## 2023-06-05 NOTE — Addendum Note (Signed)
Addended byDulce Sellar on: 06/05/2023 10:03 PM   Modules accepted: Orders

## 2023-06-06 ENCOUNTER — Other Ambulatory Visit (HOSPITAL_COMMUNITY): Payer: Self-pay

## 2023-06-07 ENCOUNTER — Other Ambulatory Visit: Payer: Self-pay

## 2023-06-14 ENCOUNTER — Other Ambulatory Visit (HOSPITAL_BASED_OUTPATIENT_CLINIC_OR_DEPARTMENT_OTHER): Payer: Self-pay

## 2023-06-14 ENCOUNTER — Other Ambulatory Visit (HOSPITAL_COMMUNITY): Payer: Self-pay

## 2023-06-16 ENCOUNTER — Other Ambulatory Visit (HOSPITAL_COMMUNITY): Payer: Self-pay

## 2023-06-17 ENCOUNTER — Other Ambulatory Visit (HOSPITAL_COMMUNITY): Payer: Self-pay

## 2023-07-05 ENCOUNTER — Other Ambulatory Visit: Payer: Self-pay

## 2023-07-05 ENCOUNTER — Other Ambulatory Visit (HOSPITAL_COMMUNITY): Payer: Self-pay

## 2023-07-06 NOTE — Telephone Encounter (Signed)
Submitted continuation request via cmm, status is pending. Key: Julie Miranda

## 2023-07-07 ENCOUNTER — Other Ambulatory Visit (HOSPITAL_COMMUNITY): Payer: Self-pay

## 2023-07-11 NOTE — Telephone Encounter (Signed)
Case ID #: [id number] (07/08/23-07/06/24)

## 2023-07-27 ENCOUNTER — Encounter: Payer: Self-pay | Admitting: Neurology

## 2023-07-27 ENCOUNTER — Ambulatory Visit: Payer: Commercial Managed Care - PPO | Admitting: Neurology

## 2023-07-27 VITALS — BP 120/84 | HR 88 | Ht 66.0 in | Wt 207.2 lb

## 2023-07-27 DIAGNOSIS — G43709 Chronic migraine without aura, not intractable, without status migrainosus: Secondary | ICD-10-CM

## 2023-07-27 MED ORDER — ONABOTULINUMTOXINA 200 UNITS IJ SOLR
155.0000 [IU] | Freq: Once | INTRAMUSCULAR | Status: AC
Start: 2023-07-27 — End: 2023-07-27
  Administered 2023-07-27: 155 [IU] via INTRAMUSCULAR

## 2023-07-27 MED ORDER — NURTEC 75 MG PO TBDP: 75.0000 mg | ORAL_TABLET | ORAL | Status: DC | PRN

## 2023-07-27 NOTE — Progress Notes (Signed)
Botox- 200 units x 1 vial Lot: [id number] Expiration: 12.2026 NDC: 0023-3921-02  Bacteriostatic 0.9% Sodium Chloride- 4 mL  Lot: [id number] Expiration: 04.01.2025 NDC: 1914782956  Dx: O13.086 S/P Witnessed by Aundra Millet, CMA

## 2023-07-27 NOTE — Progress Notes (Signed)
   BOTOX PROCEDURE NOTE FOR MIGRAINE HEADACHE  HISTORY: Julie Miranda is here for Botox.  This is her second cycle.  Last was 05/03/2023 with me. Had good benefit with Botox, didn't have any daily migraine, no more than 1-2 migraines a week. Much improvement. Remains on [place] daily. Insurance wouldn't approve Relpax.  Going to see psychiatrist to work on anxiety.  Often times anxiety/stress is trigger for migraines. 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    Description of procedure:  The patient was placed in a sitting position. The standard protocol was used for Botox as follows, with 5 units of Botox injected at each site:   -Procerus muscle, midline injection  -Corrugator muscle, bilateral injection  -Frontalis muscle, bilateral injection, with 2 sites each side, medial injection was performed in the upper one third of the frontalis muscle, in the region vertical from the medial inferior edge of the superior orbital rim. The lateral injection was again in the upper one third of the forehead vertically above the lateral limbus of the cornea, 1.5 cm lateral to the medial injection site.  -Temporalis muscle injection, 4 sites, bilaterally. The first injection was 3 cm above the tragus of the ear, second injection site was 1.5 cm to 3 cm up from the first injection site in line with the tragus of the ear. The third injection site was 1.5-3 cm forward between the first 2 injection sites. The fourth injection site was 1.5 cm posterior to the second injection site.  -Occipitalis muscle injection, 3 sites, bilaterally. The first injection was done one half way between the occipital protuberance and the tip of the mastoid process behind the ear. The second injection site was done lateral and superior to the first, 1 fingerbreadth from the first injection. The third injection site was 1 fingerbreadth superiorly and medially from the first injection site.  -Cervical paraspinal muscle injection, 2 sites,  bilateral, the first injection site was 1 cm from the midline of the cervical spine, 3 cm inferior to the lower border of the occipital protuberance. The second injection site was 1.5 cm superiorly and laterally to the first injection site.  -Trapezius muscle injection was performed at 3 sites, bilaterally. The first injection site was in the upper trapezius muscle halfway between the inflection point of the neck, and the acromion. The second injection site was one half way between the acromion and the first injection site. The third injection was done between the first injection site and the inflection point of the neck.   A 200 unit bottle of Botox was used, 155 units were injected, the rest of the Botox was wasted. The patient tolerated the procedure well, there were no complications of the above procedure.  Botox NDC 9604-5409-81 Lot number [id number] Expiration date 09/2025 SP  I gave her sample of Nurtec to try for rescue.  We will retry this to see if she has better abortive benefit.  She will continue Qulipta for prevention.

## 2023-08-01 ENCOUNTER — Other Ambulatory Visit: Payer: Commercial Managed Care - PPO

## 2023-08-02 ENCOUNTER — Other Ambulatory Visit (INDEPENDENT_AMBULATORY_CARE_PROVIDER_SITE_OTHER): Payer: Commercial Managed Care - PPO

## 2023-08-02 ENCOUNTER — Telehealth: Payer: Self-pay | Admitting: Family

## 2023-08-02 DIAGNOSIS — E039 Hypothyroidism, unspecified: Secondary | ICD-10-CM

## 2023-08-02 DIAGNOSIS — E782 Mixed hyperlipidemia: Secondary | ICD-10-CM | POA: Diagnosis not present

## 2023-08-02 LAB — LIPID PANEL
Cholesterol: 138 mg/dL (ref 0–200)
HDL: 72.1 mg/dL (ref >39.00)
LDL Cholesterol: 50 mg/dL (ref 0–99)
NonHDL: 66.35
Total CHOL/HDL Ratio: 2
Triglycerides: 82 mg/dL (ref 0.0–149.0)
VLDL: 16.4 mg/dL (ref 0.0–40.0)

## 2023-08-02 LAB — TSH: TSH: 0.12 u[IU]/mL — ABNORMAL LOW (ref 0.35–5.50)

## 2023-08-02 LAB — T4, FREE: Free T4: 1.44 ng/dL (ref 0.60–1.60)

## 2023-08-02 NOTE — Telephone Encounter (Signed)
Prescription Request  08/02/2023  LOV: 06/02/2023  What is the name of the medication or equipment?  azelastine (ASTELIN) 0.1 % nasal spray   pantoprazole (PROTONIX) 40 MG tablet      Have you contacted your pharmacy to request a refill? Yes   Which pharmacy would you like this sent to?  [place] [facility] [address] [place] [address] Phone: [phone] Fax: [phone]    Patient notified that their request is being sent to the clinical staff for review and that they should receive a response within 2 business days.   Please advise at Mobile [phone] (mobile)

## 2023-08-05 ENCOUNTER — Encounter: Payer: Self-pay | Admitting: Physician Assistant

## 2023-08-05 ENCOUNTER — Other Ambulatory Visit: Payer: Self-pay

## 2023-08-05 ENCOUNTER — Other Ambulatory Visit (HOSPITAL_COMMUNITY): Payer: Self-pay

## 2023-08-05 DIAGNOSIS — H6690 Otitis media, unspecified, unspecified ear: Secondary | ICD-10-CM

## 2023-08-05 DIAGNOSIS — K219 Gastro-esophageal reflux disease without esophagitis: Secondary | ICD-10-CM

## 2023-08-05 MED ORDER — PANTOPRAZOLE SODIUM 40 MG PO TBEC
40.0000 mg | DELAYED_RELEASE_TABLET | Freq: Every day | ORAL | 3 refills | Status: DC
  Filled 2023-08-05: qty 90, 90d supply, fill #0
  Filled 2024-04-26: qty 90, 90d supply, fill #1
  Filled 2024-06-04: qty 90, 90d supply, fill #0

## 2023-08-05 MED ORDER — AZELASTINE HCL 0.1 % NA SOLN
1.0000 | Freq: Two times a day (BID) | NASAL | 12 refills | Status: DC
  Filled 2023-08-05: qty 30, 25d supply, fill #0
  Filled 2024-04-26: qty 30, 25d supply, fill #1
  Filled 2024-06-04: qty 30, 25d supply, fill #0
  Filled 2024-06-23: qty 30, 25d supply, fill #1
  Filled 2024-07-18: qty 30, 25d supply, fill #2

## 2023-08-05 NOTE — Telephone Encounter (Signed)
ok to refill meds.

## 2023-08-05 NOTE — Telephone Encounter (Signed)
RX sent into pt pharmacy, LM for pt making her aware.

## 2023-08-08 ENCOUNTER — Other Ambulatory Visit: Payer: Self-pay

## 2023-08-10 ENCOUNTER — Other Ambulatory Visit (HOSPITAL_COMMUNITY): Payer: Self-pay

## 2023-08-10 DIAGNOSIS — F411 Generalized anxiety disorder: Secondary | ICD-10-CM | POA: Diagnosis not present

## 2023-08-10 DIAGNOSIS — F321 Major depressive disorder, single episode, moderate: Secondary | ICD-10-CM | POA: Diagnosis not present

## 2023-08-10 MED ORDER — FLUOXETINE HCL 10 MG PO CAPS
10.0000 mg | ORAL_CAPSULE | ORAL | 0 refills | Status: AC
  Filled 2023-08-10: qty 30, 30d supply, fill #0

## 2023-08-10 MED ORDER — BUSPIRONE HCL 15 MG PO TABS
15.0000 mg | ORAL_TABLET | Freq: Every day | ORAL | 0 refills | Status: DC
  Filled 2023-08-10: qty 30, 30d supply, fill #0

## 2023-08-11 ENCOUNTER — Other Ambulatory Visit (HOSPITAL_COMMUNITY): Payer: Self-pay

## 2023-08-16 ENCOUNTER — Other Ambulatory Visit (HOSPITAL_BASED_OUTPATIENT_CLINIC_OR_DEPARTMENT_OTHER): Payer: Self-pay

## 2023-08-17 ENCOUNTER — Other Ambulatory Visit (HOSPITAL_COMMUNITY): Payer: Self-pay

## 2023-08-19 ENCOUNTER — Other Ambulatory Visit (HOSPITAL_COMMUNITY): Payer: Self-pay

## 2023-08-29 ENCOUNTER — Other Ambulatory Visit: Payer: Self-pay | Admitting: Family

## 2023-08-29 ENCOUNTER — Other Ambulatory Visit (HOSPITAL_COMMUNITY): Payer: Self-pay

## 2023-08-29 DIAGNOSIS — E782 Mixed hyperlipidemia: Secondary | ICD-10-CM

## 2023-08-30 ENCOUNTER — Other Ambulatory Visit (HOSPITAL_COMMUNITY): Payer: Self-pay

## 2023-08-30 ENCOUNTER — Encounter (HOSPITAL_COMMUNITY): Payer: Self-pay

## 2023-08-31 ENCOUNTER — Other Ambulatory Visit: Payer: Self-pay

## 2023-09-02 ENCOUNTER — Encounter: Payer: Commercial Managed Care - PPO | Admitting: Family

## 2023-09-02 ENCOUNTER — Other Ambulatory Visit: Payer: Self-pay

## 2023-09-02 NOTE — Progress Notes (Deleted)
Phone [phone]  Subjective:   Patient is a 62 y.o. female presenting for annual physical.    No chief complaint on file.   See problem oriented charting- ROS- full  review of systems was completed and negative except for: ***noted in HPI above.  The following were reviewed and entered/updated in epic: Past Medical History:  Diagnosis Date   Acute low back pain due to trauma 06/28/2013   Acute renal failure (HCC) 06/27/2013   Anemia    Anemia, unspecified 06/28/2013   Anxiety    Arthritis    Bilateral shoulder pain 03/08/2016   Bilateral but Left >> right  right hand dominant  Hairdresser  Spasm in trapezius      Carpal tunnel syndrome 04/25/2017   Cervicalgia 07/30/2016   Chronic lower back pain 06/27/2013   Chronic pain of left knee 06/05/2019   Class 2 severe obesity due to excess calories with serious comorbidity and body mass index (BMI) of 37.0 to 37.9 in adult [facility]) 07/08/2020   Colon polyps    x 3   Costochondritis 03/01/2016   De Quervain's tenosynovitis, bilateral 07/23/2015   Dehydration 06/28/2013   Depression    Edema of both lower extremities    Family history of adverse reaction to anesthesia    makes mother" loopy"   Gastric ulcer    GERD (gastroesophageal reflux disease)    Graves disease    History of pancytopenia    of unknown etiology    Hyperlipidemia    Hypertension    Hypokalemia 06/27/2013   Hypothyroidism    IBS (irritable bowel syndrome)    Iron deficiency 12/16/2020   Migraine    Neutropenia (HCC) 07/11/2015   Osteoarthritis    Primary osteoarthritis of left knee 06/05/2019   Pure hypercholesterolemia 03/03/2016   Syncope and collapse 06/27/2013   Synovitis of shoulder 05/21/2016   Thyroid disease    Vitamin D deficiency    Patient Active Problem List   Diagnosis Date Noted   Mixed hyperlipidemia 06/02/2023   Vitamin D deficiency 12/29/2020   Chronic migraine w/o aura w/o status migrainosus, not intractable 12/16/2020    Iron deficiency anemia 12/01/2020   Status post total left knee replacement 07/30/2019   Generalized anxiety disorder 03/01/2016   Gastroesophageal reflux disease 10/30/2013   Endometrial mass 10/04/2013   Hypothyroidism 06/27/2013   Essential hypertension, benign 06/27/2013   Disc disorder/L4-5 prolapse 06/27/2013   Past Surgical History:  Procedure Laterality Date   COLONOSCOPY     GASTRIC BYPASS     JOINT REPLACEMENT N/A    [employer] 06/12/2020   POLYPECTOMY     TOTAL KNEE ARTHROPLASTY Left 07/30/2019   Procedure: LEFT TOTAL KNEE ARTHROPLASTY;  Surgeon: Tarry Kos, MD;  Location: [facility] OR;  Service: Orthopedics;  Laterality: Left;    Family History  Problem Relation Age of Onset   Hypertension Mother    Gout Mother    Diabetes Mother    High Cholesterol Mother    Anxiety disorder Mother    Obesity Mother    Stroke Father    Hypertension Sister    Alcohol abuse Sister    Stroke Sister    Pancreatic cancer Sister    Diabetes Maternal Aunt        x 2   Liver cancer Maternal Aunt    Alcohol abuse Maternal Grandfather    Early death Maternal Grandfather    Alcohol abuse Paternal Grandmother    Early death Paternal Grandmother    Colon cancer  Neg Hx    Kidney disease Neg Hx    Colon polyps Neg Hx    Esophageal cancer Neg Hx    Rectal cancer Neg Hx    Stomach cancer Neg Hx     Medications- reviewed and updated Current Outpatient Medications  Medication Sig Dispense Refill   albuterol (VENTOLIN HFA) 108 (90 Base) MCG/ACT inhaler Inhale 2 puffs into the lungs every 4 (four) hours as needed for shortness of breath 6.7 g 0   Atogepant (QULIPTA) 60 MG TABS Take 1 tablet by mouth daily. 30 tablet 11   atorvastatin (LIPITOR) 40 MG tablet Take 1.5 tablets (60 mg total) by mouth every other day.     azelastine (ASTELIN) 0.1 % nasal spray Place 1 spray into both nostrils 2 (two) times daily as directed 30 mL 12   botulinum toxin Type A (BOTOX) 200 units injection Inject 200  Units into the muscle every 3 (three) months. 1 each 2   busPIRone (BUSPAR) 10 MG tablet Take 1 tablet (10 mg total) by mouth 2 (two) times daily. For anxiety. 180 tablet 1   busPIRone (BUSPAR) 15 MG tablet Take 1 tablet (15 mg total) by mouth at bedtime. 30 tablet 0   eletriptan (RELPAX) 20 MG tablet Take 1 tablet (20 mg total) by mouth as needed for migraine or headache. May repeat in 2 hours if headache persists or recurs. 10 tablet 11   FLUoxetine (PROZAC) 10 MG capsule Take 1 capsule (10 mg total) by mouth every morning after a meal 30 capsule 0   gabapentin (NEURONTIN) 100 MG capsule Take 3 capsules (300 mg total) by mouth at bedtime. 180 capsule 5   levothyroxine (SYNTHROID) 150 MCG tablet Take 1 tablet (150 mcg total) by mouth daily before breakfast. 90 tablet 0   losartan-hydrochlorothiazide (HYZAAR) 50-12.5 MG tablet Take 1 tablet by mouth in the morning. 90 tablet 1   ondansetron (ZOFRAN) 4 MG tablet Take 1 tablet (4 mg total) by mouth every 8 (eight) hours as needed for nausea or vomiting. 20 tablet 2   pantoprazole (PROTONIX) 40 MG tablet Take 1 tablet (40 mg total) by mouth daily. 90 tablet 3   Rimegepant Sulfate (NURTEC) 75 MG TBDP Take 1 tablet (75 mg total) by mouth as needed.     tiZANidine (ZANAFLEX) 4 MG tablet Take 1 tablet (4 mg total) by mouth every 6 (six) hours as needed for muscle spasms. 30 tablet 6   topiramate (TOPAMAX) 100 MG tablet Take 1 tablet (100 mg total) by mouth 2 (two) times daily. 60 tablet 11   No current facility-administered medications for this visit.    Allergies-reviewed and updated Allergies  Allergen Reactions   Molds & Smuts Shortness Of Breath   Morphine And Codeine Nausea And Vomiting    Social History   Social History Narrative   Lives at home with her mother.   Right-handed.   One cup caffeine per day.    Objective:  LMP  (LMP Unknown) Comment: pt. states that it has been over 1 year Physical Exam Vitals and nursing note  reviewed.  Constitutional:      Appearance: Normal appearance.  HENT:     Head: Normocephalic.     Right Ear: Tympanic membrane normal.     Left Ear: Tympanic membrane normal.     Nose: Nose normal.     Mouth/Throat:     Mouth: Mucous membranes are moist.  Eyes:     Pupils: Pupils are equal, round, and  reactive to light.  Cardiovascular:     Rate and Rhythm: Normal rate and regular rhythm.  Pulmonary:     Effort: Pulmonary effort is normal.     Breath sounds: Normal breath sounds.  Musculoskeletal:        General: Normal range of motion.     Cervical back: Normal range of motion.  Lymphadenopathy:     Cervical: No cervical adenopathy.  Skin:    General: Skin is warm and dry.  Neurological:     Mental Status: She is alert.  Psychiatric:        Mood and Affect: Mood normal.        Behavior: Behavior normal.       Assessment and Plan   Health Maintenance counseling: 1. Anticipatory guidance: Patient counseled regarding regular dental exams q6 months, eye exams,  avoiding smoking and second hand smoke, limiting alcohol to 1 beverage per day, no illicit drugs.   2. Risk factor reduction:  Advised patient of need for regular exercise and diet rich with fruits and vegetables to reduce risk of heart attack and stroke. Exercise- ***.  Wt Readings from Last 3 Encounters:  07/27/23 207 lb 3.2 oz (94 kg)  06/02/23 211 lb 3.2 oz (95.8 kg)  05/03/23 208 lb (94.3 kg)   3. Immunizations/screenings/ancillary studies Immunization History  Administered Date(s) Administered   Influenza,inj,Quad PF,6+ Mos 11/27/2014, 07/23/2015, 07/26/2016, 07/26/2017   Influenza-Unspecified 08/12/2020   PPD Test 07/26/2016   Tdap 07/28/2016   Health Maintenance Due  Topic Date Due   Zoster Vaccines- Shingrix (1 of 2) Never done   Colonoscopy  07/06/2022   MAMMOGRAM  07/16/2022   INFLUENZA VACCINE  05/26/2023   Cervical Cancer Screening (HPV/Pap Cotest)  06/14/2023   COVID-19 Vaccine (1 - 2023-24  season) Never done    4. Cervical cancer screening- *** 5. Breast cancer screening-  mammogram *** 6. Colon cancer screening - *** 7. Skin cancer screening- advised regular sunscreen use. Denies worrisome, changing, or new skin lesions.  8. Birth control/STD check- *** 9. Osteoporosis screening- *** 10. Alcohol screening: *** 11. Smoking associated screening (lung cancer screening, AAA screen 65-75, UA)- *** smoker- ***ppd  There are no diagnoses linked to this encounter.  Recommended follow up: ***No follow-ups on file. Future Appointments  Date Time Provider Department Center  09/02/2023  8:30 AM Dulce Sellar, NP [facility]  11/09/2023  9:15 AM Glean Salvo, NP [facility] None  12/06/2023  9:30 AM Dulce Sellar, NP [facility]    Lab/Order associations:fasting   Dulce Sellar, NP

## 2023-09-13 ENCOUNTER — Other Ambulatory Visit (HOSPITAL_COMMUNITY): Payer: Self-pay

## 2023-09-13 ENCOUNTER — Encounter: Payer: Self-pay | Admitting: Family

## 2023-09-13 ENCOUNTER — Ambulatory Visit: Payer: Commercial Managed Care - PPO | Admitting: Family

## 2023-09-13 VITALS — BP 121/78 | HR 73 | Temp 98.0°F | Ht 66.0 in | Wt 208.5 lb

## 2023-09-13 DIAGNOSIS — E039 Hypothyroidism, unspecified: Secondary | ICD-10-CM

## 2023-09-13 DIAGNOSIS — R600 Localized edema: Secondary | ICD-10-CM | POA: Diagnosis not present

## 2023-09-13 DIAGNOSIS — I10 Essential (primary) hypertension: Secondary | ICD-10-CM

## 2023-09-13 DIAGNOSIS — Z1231 Encounter for screening mammogram for malignant neoplasm of breast: Secondary | ICD-10-CM

## 2023-09-13 DIAGNOSIS — R252 Cramp and spasm: Secondary | ICD-10-CM

## 2023-09-13 DIAGNOSIS — E782 Mixed hyperlipidemia: Secondary | ICD-10-CM | POA: Diagnosis not present

## 2023-09-13 DIAGNOSIS — Z0001 Encounter for general adult medical examination with abnormal findings: Secondary | ICD-10-CM

## 2023-09-13 DIAGNOSIS — Z0000 Encounter for general adult medical examination without abnormal findings: Secondary | ICD-10-CM

## 2023-09-13 LAB — CBC WITH DIFFERENTIAL/PLATELET
Basophils Absolute: 0 10*3/uL (ref 0.0–0.1)
Basophils Relative: 0.7 % (ref 0.0–3.0)
Eosinophils Absolute: 0.1 10*3/uL (ref 0.0–0.7)
Eosinophils Relative: 4.7 % (ref 0.0–5.0)
HCT: 33.6 % — ABNORMAL LOW (ref 36.0–46.0)
Hemoglobin: 10.3 g/dL — ABNORMAL LOW (ref 12.0–15.0)
Lymphocytes Relative: 45 % (ref 12.0–46.0)
Lymphs Abs: 1.3 10*3/uL (ref 0.7–4.0)
MCHC: 30.6 g/dL (ref 30.0–36.0)
MCV: 80.6 fL (ref 78.0–100.0)
Monocytes Absolute: 0.2 10*3/uL (ref 0.1–1.0)
Monocytes Relative: 8.4 % (ref 3.0–12.0)
Neutro Abs: 1.2 10*3/uL — ABNORMAL LOW (ref 1.4–7.7)
Neutrophils Relative %: 41.2 % — ABNORMAL LOW (ref 43.0–77.0)
Platelets: 327 10*3/uL (ref 150.0–400.0)
RBC: 4.17 Mil/uL (ref 3.87–5.11)
RDW: 16.8 % — ABNORMAL HIGH (ref 11.5–15.5)
WBC: 2.8 10*3/uL — ABNORMAL LOW (ref 4.0–10.5)

## 2023-09-13 LAB — LIPID PANEL
Cholesterol: 207 mg/dL — ABNORMAL HIGH (ref 0–200)
HDL: 77.8 mg/dL (ref >39.00)
LDL Cholesterol: 114 mg/dL — ABNORMAL HIGH (ref 0–99)
NonHDL: 129.67
Total CHOL/HDL Ratio: 3
Triglycerides: 79 mg/dL (ref 0.0–149.0)
VLDL: 15.8 mg/dL (ref 0.0–40.0)

## 2023-09-13 LAB — TSH: TSH: 5.93 u[IU]/mL — ABNORMAL HIGH (ref 0.35–5.50)

## 2023-09-13 MED ORDER — LOSARTAN POTASSIUM 50 MG PO TABS
50.0000 mg | ORAL_TABLET | Freq: Every day | ORAL | 1 refills | Status: DC
Start: 2023-09-13 — End: 2024-04-26
  Filled 2023-09-13: qty 90, 90d supply, fill #0
  Filled 2024-01-31 – 2024-03-23 (×3): qty 90, 90d supply, fill #1

## 2023-09-13 MED ORDER — ATORVASTATIN CALCIUM 40 MG PO TABS: 40.0000 mg | ORAL_TABLET | ORAL | 1 refills | Status: DC

## 2023-09-13 MED ORDER — ATORVASTATIN CALCIUM 40 MG PO TABS
40.0000 mg | ORAL_TABLET | Freq: Every day | ORAL | 3 refills | Status: AC
  Filled 2023-09-13: qty 90, 90d supply, fill #0
  Filled 2024-04-26: qty 90, 90d supply, fill #1
  Filled 2024-06-04: qty 90, 90d supply, fill #0
  Filled 2024-08-27: qty 90, 90d supply, fill #1

## 2023-09-13 NOTE — Patient Instructions (Addendum)
It was very nice to see you today!    I will review your lab results via MyChart in a few days. See the attached handouts for healthy living tips. I have ordered your mammogram, and the Breast center will call you to schedule. Call Dr Lauro Franklin office to schedule a Colonoscopy.  Have a great week!     PLEASE NOTE:  If you had any lab tests please let us know if you have not heard back within a few days. You may see your results on MyChart before we have a chance to review them but we will give you a call once they are reviewed by [place]. If we ordered any referrals today, please let us know if you have not heard from their office within the next week.

## 2023-09-13 NOTE — Progress Notes (Signed)
Phone [phone]  Subjective:   Patient is a 62 y.o. female presenting for annual physical.    Chief Complaint  Patient presents with   Annual Exam   Discussed the use of AI scribe software for clinical note transcription with the patient, who gave verbal consent to proceed.  History of Present Illness   The patient is here for a physical and presents with leg cramps, swelling, and constipation. She reports experiencing cramps in both legs, but more frequently in the left leg. The cramps are severe enough to immobilize her and last for over a minute. Despite drinking two liters of water daily, the cramps persist.  The patient also reports swelling in her hands and ankles. Despite taking hydrochlorothiazide, the swelling continues. She expresses confusion as to why the water is not being expelled from her body.  In addition to the cramps and swelling, the patient is experiencing constipation. She reports a lifelong history of constipation, even when she was a child. Over-the-counter remedies such as milk of magnesia, Cintriq, and Dulcolax have not been effective. She has also tried Miralax powder, but it did not provide relief. The constipation is severe enough that she can go up to three weeks without a bowel movement.  The patient also mentions that she is taking gabapentin, Protonix, and Topamax, which may be contributing to her constipation. She expresses frustration with her inability to lose weight and the discomfort caused by her large breasts.   Hypothyroidism: Patient presents today for followup of Hypothyroidism. Patient reports positive compliance with daily medication. Patient denies any of the following symptoms: fatigue, cold intolerance, constipation, weight gain or inability to lose weight, muscle weakness, mental slowing, dry hair and skin. Last TSH and free T4: Lab Results  Component Value Date   FREE T4 1.44 08/02/2023   FREE T4 0.50 (L) 06/02/2023   FREE T4 0.42 (L)  03/04/2022   TSH 5.93 (H) 09/13/2023   TSH 0.12 (L) 08/02/2023   TSH 8.75 (H) 06/02/2023     Hyperlipidemia: Patient is currently maintained on the following medication for hyperlipidemia: Lipitor 60mg  qod. Patient denies myalgia or other side effects. Patient reports good compliance with low fat/low cholesterol diet.  Last lipid panel as follows: Lab Results  Component Value Date   CHOL 207 (H) 09/13/2023   HDL 77.80 09/13/2023   LDLCALC 114 (H) 09/13/2023   TRIG 79.0 09/13/2023   CHOLHDL 3 09/13/2023   Hypertension: Patient is currently maintained on the following medications for blood pressure: Losartan-HCTZ 50-12.5mg  qam; Patient reports good compliance with blood pressure medications. Patient denies chest pain, headaches, shortness of breath or swelling. Last 3 blood pressure readings in our office are as follows: BP Readings from Last 3 Encounters:  09/13/23 121/78  07/27/23 120/84  06/02/23 (!) 162/92    See problem oriented charting- ROS- full  review of systems was completed and negative except for: hypothyroidism noted in HPI above.  The following were reviewed and entered/updated in epic: Past Medical History:  Diagnosis Date   Acute low back pain due to trauma 06/28/2013   Acute renal failure (HCC) 06/27/2013   Anemia    Anemia, unspecified 06/28/2013   Anxiety    Arthritis    Bilateral shoulder pain 03/08/2016   Bilateral but Left >> right  right hand dominant  Hairdresser  Spasm in trapezius      Carpal tunnel syndrome 04/25/2017   Cervicalgia 07/30/2016   Chronic lower back pain 06/27/2013   Chronic pain of left knee  06/05/2019   Class 2 severe obesity due to excess calories with serious comorbidity and body mass index (BMI) of 37.0 to 37.9 in adult [facility]) 07/08/2020   Colon polyps    x 3   Costochondritis 03/01/2016   De Quervain's tenosynovitis, bilateral 07/23/2015   Dehydration 06/28/2013   Depression    Edema of both lower extremities    Family  history of adverse reaction to anesthesia    makes mother" loopy"   Gastric ulcer    GERD (gastroesophageal reflux disease)    Graves disease    History of pancytopenia    of unknown etiology    Hyperlipidemia    Hypertension    Hypokalemia 06/27/2013   Hypothyroidism    IBS (irritable bowel syndrome)    Iron deficiency 12/16/2020   Migraine    Neutropenia (HCC) 07/11/2015   Osteoarthritis    Primary osteoarthritis of left knee 06/05/2019   Pure hypercholesterolemia 03/03/2016   Syncope and collapse 06/27/2013   Synovitis of shoulder 05/21/2016   Thyroid disease    Vitamin D deficiency    Patient Active Problem List   Diagnosis Date Noted   Mixed hyperlipidemia 06/02/2023   Vitamin D deficiency 12/29/2020   Chronic migraine w/o aura w/o status migrainosus, not intractable 12/16/2020   Iron deficiency anemia 12/01/2020   Status post total left knee replacement 07/30/2019   Generalized anxiety disorder 03/01/2016   Gastroesophageal reflux disease 10/30/2013   Endometrial mass 10/04/2013   Hypothyroidism 06/27/2013   Essential hypertension, benign 06/27/2013   Disc disorder/L4-5 prolapse 06/27/2013   Past Surgical History:  Procedure Laterality Date   COLONOSCOPY     GASTRIC BYPASS     JOINT REPLACEMENT N/A    [employer] 06/12/2020   POLYPECTOMY     TOTAL KNEE ARTHROPLASTY Left 07/30/2019   Procedure: LEFT TOTAL KNEE ARTHROPLASTY;  Surgeon: Tarry Kos, MD;  Location: [facility] OR;  Service: Orthopedics;  Laterality: Left;    Family History  Problem Relation Age of Onset   Hypertension Mother    Gout Mother    Diabetes Mother    High Cholesterol Mother    Anxiety disorder Mother    Obesity Mother    Stroke Father    Hypertension Sister    Alcohol abuse Sister    Stroke Sister    Pancreatic cancer Sister    Diabetes Maternal Aunt        x 2   Liver cancer Maternal Aunt    Alcohol abuse Maternal Grandfather    Early death Maternal Grandfather    Alcohol abuse  Paternal Grandmother    Early death Paternal Grandmother    Colon cancer Neg Hx    Kidney disease Neg Hx    Colon polyps Neg Hx    Esophageal cancer Neg Hx    Rectal cancer Neg Hx    Stomach cancer Neg Hx     Medications- reviewed and updated Current Outpatient Medications  Medication Sig Dispense Refill   albuterol (VENTOLIN HFA) 108 (90 Base) MCG/ACT inhaler Inhale 2 puffs into the lungs every 4 (four) hours as needed for shortness of breath 6.7 g 0   Atogepant (QULIPTA) 60 MG TABS Take 1 tablet by mouth daily. 30 tablet 11   azelastine (ASTELIN) 0.1 % nasal spray Place 1 spray into both nostrils 2 (two) times daily as directed 30 mL 12   botulinum toxin Type A (BOTOX) 200 units injection Inject 200 Units into the muscle every 3 (three) months. 1 each 2  busPIRone (BUSPAR) 10 MG tablet Take 1 tablet (10 mg total) by mouth 2 (two) times daily. For anxiety. 180 tablet 1   busPIRone (BUSPAR) 15 MG tablet Take 1 tablet (15 mg total) by mouth at bedtime. 30 tablet 0   eletriptan (RELPAX) 20 MG tablet Take 1 tablet (20 mg total) by mouth as needed for migraine or headache. May repeat in 2 hours if headache persists or recurs. 10 tablet 11   FLUoxetine (PROZAC) 10 MG capsule Take 1 capsule (10 mg total) by mouth every morning after a meal 30 capsule 0   gabapentin (NEURONTIN) 100 MG capsule Take 3 capsules (300 mg total) by mouth at bedtime. 180 capsule 5   levothyroxine (SYNTHROID) 150 MCG tablet Take 1 tablet (150 mcg total) by mouth daily before breakfast. 90 tablet 0   losartan (COZAAR) 50 MG tablet Take 1 tablet (50 mg total) by mouth daily. 90 tablet 1   ondansetron (ZOFRAN) 4 MG tablet Take 1 tablet (4 mg total) by mouth every 8 (eight) hours as needed for nausea or vomiting. 20 tablet 2   pantoprazole (PROTONIX) 40 MG tablet Take 1 tablet (40 mg total) by mouth daily. 90 tablet 3   Rimegepant Sulfate (NURTEC) 75 MG TBDP Take 1 tablet (75 mg total) by mouth as needed.     tiZANidine  (ZANAFLEX) 4 MG tablet Take 1 tablet (4 mg total) by mouth every 6 (six) hours as needed for muscle spasms. 30 tablet 6   topiramate (TOPAMAX) 100 MG tablet Take 1 tablet (100 mg total) by mouth 2 (two) times daily. 60 tablet 11   atorvastatin (LIPITOR) 40 MG tablet Take 1 tablet (40 mg total) by mouth daily. 90 tablet 3   No current facility-administered medications for this visit.    Allergies-reviewed and updated Allergies  Allergen Reactions   Molds & Smuts Shortness Of Breath   Morphine And Codeine Nausea And Vomiting    Social History   Social History Narrative   Lives at home with her mother.   Right-handed.   One cup caffeine per day.    Objective:  BP 121/78 (BP Location: Left Arm, Patient Position: Sitting, Cuff Size: Large)   Pulse 73   Temp 98 F (36.7 C) (Temporal)   Ht 5\' 6"  (1.676 m)   Wt 208 lb 8 oz (94.6 kg)   LMP  (LMP Unknown) Comment: pt. states that it has been over 1 year  SpO2 100%   BMI 33.65 kg/m  Physical Exam Vitals and nursing note reviewed.  Constitutional:      Appearance: Normal appearance.  HENT:     Head: Normocephalic.     Right Ear: Tympanic membrane normal.     Left Ear: Tympanic membrane normal.     Nose: Nose normal.     Mouth/Throat:     Mouth: Mucous membranes are moist.  Eyes:     Pupils: Pupils are equal, round, and reactive to light.  Cardiovascular:     Rate and Rhythm: Normal rate and regular rhythm.  Pulmonary:     Effort: Pulmonary effort is normal.     Breath sounds: Normal breath sounds.  Musculoskeletal:        General: Normal range of motion.     Cervical back: Normal range of motion.  Lymphadenopathy:     Cervical: No cervical adenopathy.  Skin:    General: Skin is warm and dry.  Neurological:     Mental Status: She is alert.  Psychiatric:  Mood and Affect: Mood normal.        Behavior: Behavior normal.     Assessment and Plan   Health Maintenance counseling: 1. Anticipatory guidance: Patient  counseled regarding regular dental exams q6 months, eye exams,  avoiding smoking and second hand smoke, limiting alcohol to 1 beverage per day, no illicit drugs.   2. Risk factor reduction:  Advised patient of need for regular exercise and diet rich with fruits and vegetables to reduce risk of heart attack and stroke. Exercise- none.  Wt Readings from Last 3 Encounters:  09/13/23 208 lb 8 oz (94.6 kg)  07/27/23 207 lb 3.2 oz (94 kg)  06/02/23 211 lb 3.2 oz (95.8 kg)   3. Immunizations/screenings/ancillary studies Immunization History  Administered Date(s) Administered   Influenza,inj,Quad PF,6+ Mos 11/27/2014, 07/23/2015, 07/26/2016, 07/26/2017, 08/03/2023   Influenza-Unspecified 08/12/2020, 08/19/2022   PPD Test 07/26/2016   Tdap 07/28/2016   Health Maintenance Due  Topic Date Due   Zoster Vaccines- Shingrix (1 of 2) Never done   MAMMOGRAM  07/16/2022   Cervical Cancer Screening (HPV/Pap Cotest)  06/14/2023   COVID-19 Vaccine (1 - 2023-24 season) Never done    4. Cervical cancer screening- due 2025 5. Breast cancer screening-  mammogram ordered today 6. Colon cancer screening - due this year 7. Skin cancer screening- advised regular sunscreen use. Denies worrisome, changing, or new skin lesions.  8. Birth control/STD check- N/A 9. Osteoporosis screening- N/A 10. Alcohol screening: none 11. Smoking associated screening (lung cancer screening, AAA screen 65-75, UA)- non- smoker  Assessment and Plan    Muscle Cramps - Frequent muscle cramps in both thighs, despite adequate hydration. No clear etiology identified. -Consider discontinuing Hydrochlorothiazide due to potential contribution to muscle cramps.  LL Edema - Persistent swelling in hands and ankles despite Hydrochlorothiazide use. -Discontinue Hydrochlorothiazide due to lack of efficacy and potential contribution to muscle cramps. -Advised pt on increasing water intake to 2.5 liters daily and reducing sodium intake. -Can  try mild compression socks for LLE, wearing daily, off overnight.  Chronic Constipation - Longstanding constipation unresponsive to over-the-counter remedies. Previous prescription medication via GI was ineffective. -F/U with GI for further evaluation and management. -Schedule colonoscopy due to history of polyps and ongoing constipation.  Hyperlipidemia - Patient ran out of Atorvastatin and was not taking it consistently. -Refill Atorvastatin 40mg  qd prescription. -Check lipid panel today to assess control.  Hypothyroidism - Taking Levothyroxine qd. Dose increased from in August. -REcheck TSH today -no refill needed today -f/u in 6-12 mos  Annual Physical - -Send order for Mammogram. -Pt to f/u with GI for Colonoscopy -Check CBC, lipids, TSH today, CMP done last month wnl      Recommended follow up:   No follow-ups on file. Future Appointments  Date Time Provider Department Center  10/12/2023  4:50 PM [facility]  11/09/2023  9:15 AM Glean Salvo, NP [facility] None  12/06/2023  9:30 AM Dulce Sellar, NP [facility]    Lab/Order associations: fasting   Dulce Sellar, NP

## 2023-09-13 NOTE — Assessment & Plan Note (Signed)
chronic, non-compliant taking Levothyroxine , increased 05/2023-states she is taking daily reports still having wt gain, mood changes, feels hot all the time re-checking labs today f/u 6-12 mos

## 2023-09-13 NOTE — Assessment & Plan Note (Addendum)
chronic pt states she is taking Lipitor 60mg  qod - pt came to me already on qod dosing, did not notice this before labs 05/2023 - TC 138; LDL 50 today TC 207; LDL 114 sending Lipitor refill for 40mg  qd f/u in 1 year

## 2023-09-14 ENCOUNTER — Other Ambulatory Visit (HOSPITAL_COMMUNITY): Payer: Self-pay

## 2023-09-21 ENCOUNTER — Other Ambulatory Visit (HOSPITAL_COMMUNITY): Payer: Self-pay

## 2023-10-10 ENCOUNTER — Other Ambulatory Visit: Payer: Self-pay

## 2023-10-10 ENCOUNTER — Other Ambulatory Visit (HOSPITAL_COMMUNITY): Payer: Self-pay

## 2023-10-11 ENCOUNTER — Other Ambulatory Visit: Payer: Self-pay

## 2023-10-12 ENCOUNTER — Ambulatory Visit: Payer: Commercial Managed Care - PPO

## 2023-10-17 DIAGNOSIS — F321 Major depressive disorder, single episode, moderate: Secondary | ICD-10-CM | POA: Diagnosis not present

## 2023-10-17 DIAGNOSIS — F411 Generalized anxiety disorder: Secondary | ICD-10-CM | POA: Diagnosis not present

## 2023-10-18 ENCOUNTER — Other Ambulatory Visit (HOSPITAL_COMMUNITY): Payer: Self-pay

## 2023-10-18 MED ORDER — BUSPIRONE HCL 15 MG PO TABS
15.0000 mg | ORAL_TABLET | Freq: Two times a day (BID) | ORAL | 0 refills | Status: DC
  Filled 2023-10-18: qty 60, 30d supply, fill #0

## 2023-10-18 MED ORDER — FLUOXETINE HCL 20 MG PO CAPS
20.0000 mg | ORAL_CAPSULE | Freq: Every morning | ORAL | 0 refills | Status: DC
  Filled 2023-10-18: qty 30, 30d supply, fill #0

## 2023-10-21 ENCOUNTER — Encounter: Payer: Self-pay | Admitting: Physician Assistant

## 2023-10-21 ENCOUNTER — Other Ambulatory Visit (HOSPITAL_COMMUNITY): Payer: Self-pay

## 2023-10-25 ENCOUNTER — Other Ambulatory Visit (HOSPITAL_COMMUNITY): Payer: Self-pay

## 2023-10-31 ENCOUNTER — Encounter (HOSPITAL_COMMUNITY): Payer: Self-pay

## 2023-10-31 ENCOUNTER — Other Ambulatory Visit (HOSPITAL_COMMUNITY): Payer: Self-pay

## 2023-11-01 ENCOUNTER — Ambulatory Visit: Payer: Commercial Managed Care - PPO | Admitting: Neurology

## 2023-11-02 ENCOUNTER — Other Ambulatory Visit (HOSPITAL_COMMUNITY): Payer: Self-pay

## 2023-11-03 ENCOUNTER — Ambulatory Visit: Payer: Commercial Managed Care - PPO

## 2023-11-07 ENCOUNTER — Other Ambulatory Visit (HOSPITAL_COMMUNITY): Payer: Self-pay

## 2023-11-07 ENCOUNTER — Other Ambulatory Visit (HOSPITAL_COMMUNITY): Payer: Self-pay | Admitting: Pharmacy Technician

## 2023-11-07 ENCOUNTER — Other Ambulatory Visit: Payer: Self-pay

## 2023-11-07 NOTE — Progress Notes (Signed)
 Specialty Pharmacy Refill Coordination Note  Julie Miranda is a 63 y.o. female assessed today regarding refills of clinic administered specialty medication(s) OnabotulinumtoxinA  (BOTOX )  Requested by Maurine HERO  Clinic requested Courier to Provider Office   Delivery date: 11/08/23   Verified address: [facility] [address]   Medication will be filled on 11/07/23.

## 2023-11-08 ENCOUNTER — Other Ambulatory Visit: Payer: Self-pay

## 2023-11-08 ENCOUNTER — Telehealth: Payer: Self-pay | Admitting: Neurology

## 2023-11-08 ENCOUNTER — Other Ambulatory Visit (HOSPITAL_COMMUNITY): Payer: Self-pay

## 2023-11-08 NOTE — Telephone Encounter (Signed)
 Pt cx appt due to work

## 2023-11-09 ENCOUNTER — Ambulatory Visit: Payer: Commercial Managed Care - PPO | Admitting: Neurology

## 2023-11-09 NOTE — Telephone Encounter (Signed)
 Pt accepted appointment for 1/16 at 7:45 am. The original spot I called and left her a voicemail offering had been taken. It looks like she called twice regarding cancelling today's appt but was still on the schedule. I cancelled today's appt so she did not receive no show fee.

## 2023-11-09 NOTE — Telephone Encounter (Signed)
 Pt called to verify appointment for Botox . Informed patient appointment was schedule on today. Patient said appointment to be scheduled on 11/10/23. Julie Miranda) spoke with the Botox  Coordinator, she said had a appointment on hold for her, but is no longer available. Will check for another appointment and call her. Informed the patient of that and Botox  coordinator will call you back.

## 2023-11-10 ENCOUNTER — Ambulatory Visit: Payer: Commercial Managed Care - PPO | Admitting: Neurology

## 2023-11-17 ENCOUNTER — Ambulatory Visit: Payer: Commercial Managed Care - PPO | Admitting: Neurology

## 2023-11-23 NOTE — Telephone Encounter (Signed)
LVM offering 1/30 @ 12:45 pm for pt to come in for Botox. Held spot for the time being.

## 2023-11-28 ENCOUNTER — Other Ambulatory Visit (HOSPITAL_COMMUNITY): Payer: Self-pay

## 2023-11-28 DIAGNOSIS — F411 Generalized anxiety disorder: Secondary | ICD-10-CM | POA: Diagnosis not present

## 2023-11-28 DIAGNOSIS — F321 Major depressive disorder, single episode, moderate: Secondary | ICD-10-CM | POA: Diagnosis not present

## 2023-11-28 MED ORDER — FLUOXETINE HCL 20 MG PO CAPS
20.0000 mg | ORAL_CAPSULE | Freq: Every morning | ORAL | 1 refills | Status: DC
  Filled 2023-11-28: qty 30, 30d supply, fill #0
  Filled 2024-01-04: qty 30, 30d supply, fill #1

## 2023-11-28 MED ORDER — BUSPIRONE HCL 15 MG PO TABS
15.0000 mg | ORAL_TABLET | Freq: Two times a day (BID) | ORAL | 1 refills | Status: AC
  Filled 2023-11-28: qty 60, 30d supply, fill #0
  Filled 2024-04-26 – 2024-06-28 (×2): qty 60, 30d supply, fill #1

## 2023-11-29 ENCOUNTER — Other Ambulatory Visit (HOSPITAL_COMMUNITY): Payer: Self-pay

## 2023-12-06 ENCOUNTER — Ambulatory Visit: Payer: Commercial Managed Care - PPO | Admitting: Family

## 2023-12-06 NOTE — Progress Notes (Deleted)
 Patient ID: Julie Miranda, female    DOB: 1961-02-23, 63 y.o.   MRN: [id number]  No chief complaint on file.           Assessment & Plan:   Subjective:    Outpatient Medications Prior to Visit  Medication Sig Dispense Refill   albuterol (VENTOLIN HFA) 108 (90 Base) MCG/ACT inhaler Inhale 2 puffs into the lungs every 4 (four) hours as needed for shortness of breath 6.7 g 0   Atogepant (QULIPTA) 60 MG TABS Take 1 tablet by mouth daily. 30 tablet 11   atorvastatin (LIPITOR) 40 MG tablet Take 1 tablet (40 mg total) by mouth daily. 90 tablet 3   azelastine (ASTELIN) 0.1 % nasal spray Place 1 spray into both nostrils 2 (two) times daily as directed 30 mL 12   botulinum toxin Type A (BOTOX) 200 units injection Inject 200 Units into the muscle every 3 (three) months. 1 each 2   busPIRone (BUSPAR) 10 MG tablet Take 1 tablet (10 mg total) by mouth 2 (two) times daily. For anxiety. 180 tablet 1   busPIRone (BUSPAR) 15 MG tablet Take 1 tablet (15 mg total) by mouth 2 (two) times daily for anxiety. 60 tablet 1   eletriptan (RELPAX) 20 MG tablet Take 1 tablet (20 mg total) by mouth as needed for migraine or headache. May repeat in 2 hours if headache persists or recurs. 10 tablet 11   FLUoxetine (PROZAC) 10 MG capsule Take 1 capsule (10 mg total) by mouth every morning after a meal 30 capsule 0   FLUoxetine (PROZAC) 20 MG capsule Take 1 capsule (20 mg total) by mouth every morning after a meal. 30 capsule 1   gabapentin (NEURONTIN) 100 MG capsule Take 3 capsules (300 mg total) by mouth at bedtime. 180 capsule 5   levothyroxine (SYNTHROID) 150 MCG tablet Take 1 tablet (150 mcg total) by mouth daily before breakfast. 90 tablet 0   losartan (COZAAR) 50 MG tablet Take 1 tablet (50 mg total) by mouth daily. 90 tablet 1   ondansetron (ZOFRAN) 4 MG tablet Take 1 tablet (4 mg total) by mouth every 8 (eight) hours as needed for nausea or vomiting. 20 tablet 2   pantoprazole (PROTONIX) 40 MG tablet Take 1  tablet (40 mg total) by mouth daily. 90 tablet 3   Rimegepant Sulfate (NURTEC) 75 MG TBDP Take 1 tablet (75 mg total) by mouth as needed.     tiZANidine (ZANAFLEX) 4 MG tablet Take 1 tablet (4 mg total) by mouth every 6 (six) hours as needed for muscle spasms. 30 tablet 6   topiramate (TOPAMAX) 100 MG tablet Take 1 tablet (100 mg total) by mouth 2 (two) times daily. 60 tablet 11   No facility-administered medications prior to visit.   Past Medical History:  Diagnosis Date   Acute low back pain due to trauma 06/28/2013   Acute renal failure (HCC) 06/27/2013   Anemia    Anemia, unspecified 06/28/2013   Anxiety    Arthritis    Bilateral shoulder pain 03/08/2016   Bilateral but Left >> right  right hand dominant  Hairdresser  Spasm in trapezius      Carpal tunnel syndrome 04/25/2017   Cervicalgia 07/30/2016   Chronic lower back pain 06/27/2013   Chronic pain of left knee 06/05/2019   Class 2 severe obesity due to excess calories with serious comorbidity and body mass index (BMI) of 37.0 to 37.9 in adult [facility]) 07/08/2020   Colon polyps  x 3   Costochondritis 03/01/2016   De Quervain's tenosynovitis, bilateral 07/23/2015   Dehydration 06/28/2013   Depression    Edema of both lower extremities    Family history of adverse reaction to anesthesia    makes mother" loopy"   Gastric ulcer    GERD (gastroesophageal reflux disease)    Graves disease    History of pancytopenia    of unknown etiology    Hyperlipidemia    Hypertension    Hypokalemia 06/27/2013   Hypothyroidism    IBS (irritable bowel syndrome)    Iron deficiency 12/16/2020   Migraine    Neutropenia (HCC) 07/11/2015   Osteoarthritis    Primary osteoarthritis of left knee 06/05/2019   Pure hypercholesterolemia 03/03/2016   Syncope and collapse 06/27/2013   Synovitis of shoulder 05/21/2016   Thyroid disease    Vitamin D deficiency    Past Surgical History:  Procedure Laterality Date   COLONOSCOPY     GASTRIC  BYPASS     JOINT REPLACEMENT N/A    Phreesia 06/12/2020   POLYPECTOMY     TOTAL KNEE ARTHROPLASTY Left 07/30/2019   Procedure: LEFT TOTAL KNEE ARTHROPLASTY;  Surgeon: Tarry Kos, MD;  Location: [facility] OR;  Service: Orthopedics;  Laterality: Left;   Allergies  Allergen Reactions   Molds & Smuts Shortness Of Breath   Morphine And Codeine Nausea And Vomiting      Objective:    Physical Exam Vitals and nursing note reviewed.  Constitutional:      Appearance: Normal appearance.  Cardiovascular:     Rate and Rhythm: Normal rate and regular rhythm.  Pulmonary:     Effort: Pulmonary effort is normal.     Breath sounds: Normal breath sounds.  Musculoskeletal:        General: Normal range of motion.  Skin:    General: Skin is warm and dry.  Neurological:     Mental Status: She is alert.  Psychiatric:        Mood and Affect: Mood normal.        Behavior: Behavior normal.    LMP  (LMP Unknown) Comment: pt. states that it has been over 1 year Wt Readings from Last 3 Encounters:  09/13/23 208 lb 8 oz (94.6 kg)  07/27/23 207 lb 3.2 oz (94 kg)  06/02/23 211 lb 3.2 oz (95.8 kg)       Dulce Sellar, NP

## 2023-12-20 ENCOUNTER — Ambulatory Visit (INDEPENDENT_AMBULATORY_CARE_PROVIDER_SITE_OTHER): Payer: Commercial Managed Care - PPO | Admitting: Neurology

## 2023-12-20 ENCOUNTER — Other Ambulatory Visit: Payer: Self-pay

## 2023-12-20 ENCOUNTER — Encounter: Payer: Self-pay | Admitting: Physician Assistant

## 2023-12-20 ENCOUNTER — Other Ambulatory Visit (HOSPITAL_COMMUNITY): Payer: Self-pay

## 2023-12-20 DIAGNOSIS — R110 Nausea: Secondary | ICD-10-CM

## 2023-12-20 DIAGNOSIS — G43709 Chronic migraine without aura, not intractable, without status migrainosus: Secondary | ICD-10-CM

## 2023-12-20 MED ORDER — ONABOTULINUMTOXINA 200 UNITS IJ SOLR
155.0000 [IU] | Freq: Once | INTRAMUSCULAR | Status: AC
  Administered 2023-12-20: 155 [IU] via INTRAMUSCULAR

## 2023-12-20 MED ORDER — TOPIRAMATE 100 MG PO TABS
100.0000 mg | ORAL_TABLET | Freq: Two times a day (BID) | ORAL | 3 refills | Status: AC
  Filled 2023-12-20: qty 180, 90d supply, fill #0
  Filled 2024-04-26: qty 180, 90d supply, fill #1
  Filled 2024-06-04: qty 180, 90d supply, fill #0
  Filled 2024-08-27: qty 180, 90d supply, fill #1
  Filled 2024-11-25: qty 180, 90d supply, fill #2

## 2023-12-20 MED ORDER — NURTEC 75 MG PO TBDP
75.0000 mg | ORAL_TABLET | ORAL | 11 refills | Status: DC | PRN
  Filled 2023-12-20: qty 8, 30d supply, fill #0

## 2023-12-20 MED ORDER — QULIPTA 60 MG PO TABS
60.0000 mg | ORAL_TABLET | Freq: Every day | ORAL | 11 refills | Status: DC
  Filled 2023-12-20: qty 30, 30d supply, fill #0
  Filled 2024-04-26: qty 30, 30d supply, fill #1
  Filled 2024-06-04: qty 30, 30d supply, fill #0
  Filled 2024-06-28: qty 30, 30d supply, fill #1
  Filled 2024-07-28 – 2024-08-08 (×2): qty 30, 30d supply, fill #2
  Filled 2024-09-05: qty 30, 30d supply, fill #3

## 2023-12-20 MED ORDER — ONDANSETRON HCL 4 MG PO TABS
4.0000 mg | ORAL_TABLET | Freq: Three times a day (TID) | ORAL | 2 refills | Status: AC | PRN
Start: 2023-12-20 — End: ?
  Filled 2023-12-20 – 2024-06-04 (×2): qty 20, 7d supply, fill #0

## 2023-12-20 NOTE — Progress Notes (Signed)
 BOTOX PROCEDURE NOTE FOR MIGRAINE HEADACHE  HISTORY: Julie Miranda is here for Botox today. Last was 07/27/23 with me. This is her 3rd cycle. Is currently overdue for Botox. Having more migraines. Had good benefit with Botox, 1-2 migraines a week. No longer daily. Currently having daily migraine since overdue for Botox. Remains on Qulipta, Topamax. Nurtec worked great, would like rx. Stress with work, works as Mining engineer.    Description of procedure:  The patient was placed in a sitting position. The standard protocol was used for Botox as follows, with 5 units of Botox injected at each site:  -Procerus muscle, midline injection  -Corrugator muscle, bilateral injection  -Frontalis muscle, bilateral injection, with 2 sites each side, medial injection was performed in the upper one third of the frontalis muscle, in the region vertical from the medial inferior edge of the superior orbital rim. The lateral injection was again in the upper one third of the forehead vertically above the lateral limbus of the cornea, 1.5 cm lateral to the medial injection site.  -Temporalis muscle injection, 4 sites, bilaterally. The first injection was 3 cm above the tragus of the ear, second injection site was 1.5 cm to 3 cm up from the first injection site in line with the tragus of the ear. The third injection site was 1.5-3 cm forward between the first 2 injection sites. The fourth injection site was 1.5 cm posterior to the second injection site.  -Occipitalis muscle injection, 3 sites, bilaterally. The first injection was done one half way between the occipital protuberance and the tip of the mastoid process behind the ear. The second injection site was done lateral and superior to the first, 1 fingerbreadth from the first injection. The third injection site was 1 fingerbreadth superiorly and medially from the first injection site.  -Cervical paraspinal muscle injection, 2 sites, bilateral, the first  injection site was 1 cm from the midline of the cervical spine, 3 cm inferior to the lower border of the occipital protuberance. The second injection site was 1.5 cm superiorly and laterally to the first injection site.  -Trapezius muscle injection was performed at 3 sites, bilaterally. The first injection site was in the upper trapezius muscle halfway between the inflection point of the neck, and the acromion. The second injection site was one half way between the acromion and the first injection site. The third injection was done between the first injection site and the inflection point of the neck.  A 200 unit bottle of Botox was used, 155 units were injected, the rest of the Botox was wasted. The patient tolerated the procedure well, there were no complications of the above procedure.  Botox NDC 1610-9604-54 Lot number [id number] Expiration date 06/2025 SP  We will continue Botox injections for chronic migraine. Continue Topamax 100 mg BID, Qulipta 60 mg daily. Send in Nurtec PRN for rescue, combine with Zofran for nausea.  -Previously tried and failed: Aimovig, Topamax, Nurtec, nortriptyline, Effexor, Maxalt, Imitrex, Insurance wouldn't cover Relpax, Bernita Raisin was not effective.    Meds ordered this encounter  Medications   botulinum toxin Type A (BOTOX) injection 155 Units    Botox- 200 units x 1 vial Lot: [id number] Expiration: 06/2025 NDC: 2130-8657-84  Bacteriostatic 0.9% Sodium Chloride- 4 mL  Lot: [id number] Expiration: 08/25/2024 NDC: 2841-3244-01  Dx: U27.253 S/P  Witnessed by Christophe Louis CMA   Rimegepant Sulfate (NURTEC) 75 MG TBDP    Sig: Take 1 tablet (75 mg total) by mouth as needed.  Dispense:  8 tablet    Refill:  11   Atogepant (QULIPTA) 60 MG TABS    Sig: Take 1 tablet by mouth daily.    Dispense:  30 tablet    Refill:  11   topiramate (TOPAMAX) 100 MG tablet    Sig: Take 1 tablet (100 mg total) by mouth 2 (two) times daily.    Dispense:  180 tablet    Refill:  3    ondansetron (ZOFRAN) 4 MG tablet    Sig: Take 1 tablet (4 mg total) by mouth every 8 (eight) hours as needed for nausea or vomiting.    Dispense:  20 tablet    Refill:  2

## 2023-12-20 NOTE — Progress Notes (Signed)
 Botox- 200 units x 1 vial Lot: [id number] Expiration: 06/2025 NDC: 1914-7829-56  Bacteriostatic 0.9% Sodium Chloride- 4 mL  Lot: [id number] Expiration: 08/25/2024 NDC: 5784-6962-95  Dx: M84.132 S/P  Witnessed by Christophe Louis CMA

## 2023-12-22 ENCOUNTER — Telehealth: Payer: Self-pay | Admitting: Pharmacist

## 2023-12-22 ENCOUNTER — Other Ambulatory Visit (HOSPITAL_COMMUNITY): Payer: Self-pay

## 2023-12-22 ENCOUNTER — Other Ambulatory Visit: Payer: Self-pay

## 2023-12-22 NOTE — Telephone Encounter (Signed)
 Pharmacy Patient Advocate Encounter   Received notification from Patient Pharmacy that prior authorization for Nurtec 75MG  dispersible tablets is required/requested.   Insurance verification completed.   The patient is insured through [facility] .   Per test claim: PA required; PA submitted to above mentioned insurance via CoverMyMeds Key/confirmation #/EOC [id number] Status is pending

## 2023-12-26 NOTE — Telephone Encounter (Signed)
 Pharmacy Patient Advocate Encounter  Received notification from [facility] that Prior Authorization for Nurtec 75MG  dispersible tablets has been APPROVED from 12/24/2023 to 06/21/2024   PA #/Case ID/Reference #: [id number]

## 2023-12-27 ENCOUNTER — Encounter: Payer: Self-pay | Admitting: Neurology

## 2023-12-27 ENCOUNTER — Other Ambulatory Visit (HOSPITAL_COMMUNITY): Payer: Self-pay

## 2023-12-27 MED ORDER — NURTEC 75 MG PO TBDP
75.0000 mg | ORAL_TABLET | ORAL | 11 refills | Status: DC | PRN
  Filled 2023-12-27: qty 8, 30d supply, fill #0
  Filled 2024-02-01: qty 8, 30d supply, fill #1
  Filled 2024-04-26: qty 8, 30d supply, fill #2
  Filled 2024-06-04: qty 8, 30d supply, fill #0

## 2024-01-04 ENCOUNTER — Other Ambulatory Visit (HOSPITAL_COMMUNITY): Payer: Self-pay

## 2024-01-05 ENCOUNTER — Other Ambulatory Visit: Payer: Self-pay

## 2024-01-19 ENCOUNTER — Other Ambulatory Visit: Payer: Self-pay

## 2024-01-20 ENCOUNTER — Other Ambulatory Visit (HOSPITAL_COMMUNITY): Payer: Self-pay

## 2024-01-20 DIAGNOSIS — F411 Generalized anxiety disorder: Secondary | ICD-10-CM | POA: Diagnosis not present

## 2024-01-20 DIAGNOSIS — F321 Major depressive disorder, single episode, moderate: Secondary | ICD-10-CM | POA: Diagnosis not present

## 2024-01-20 MED ORDER — BUSPIRONE HCL 15 MG PO TABS
15.0000 mg | ORAL_TABLET | Freq: Two times a day (BID) | ORAL | 1 refills | Status: AC
Start: 2024-01-20 — End: ?
  Filled 2024-01-20: qty 60, 30d supply, fill #0
  Filled 2024-07-28 – 2024-08-08 (×2): qty 60, 30d supply, fill #1

## 2024-01-20 MED ORDER — FLUOXETINE HCL 20 MG PO CAPS
20.0000 mg | ORAL_CAPSULE | Freq: Every day | ORAL | 1 refills | Status: AC
  Filled 2024-02-14: qty 30, 30d supply, fill #0
  Filled 2024-04-26 – 2024-06-27 (×19): qty 30, 30d supply, fill #1

## 2024-01-31 ENCOUNTER — Other Ambulatory Visit (HOSPITAL_COMMUNITY): Payer: Self-pay

## 2024-01-31 ENCOUNTER — Other Ambulatory Visit: Payer: Self-pay

## 2024-01-31 ENCOUNTER — Other Ambulatory Visit: Payer: Self-pay | Admitting: Family

## 2024-01-31 DIAGNOSIS — E039 Hypothyroidism, unspecified: Secondary | ICD-10-CM

## 2024-02-01 ENCOUNTER — Other Ambulatory Visit (HOSPITAL_COMMUNITY): Payer: Self-pay

## 2024-02-01 ENCOUNTER — Other Ambulatory Visit: Payer: Self-pay

## 2024-02-01 MED ORDER — LEVOTHYROXINE SODIUM 150 MCG PO TABS
150.0000 ug | ORAL_TABLET | Freq: Every day | ORAL | 0 refills | Status: DC
Start: 2024-02-01 — End: 2024-04-26
  Filled 2024-02-01: qty 90, 90d supply, fill #0

## 2024-02-13 ENCOUNTER — Other Ambulatory Visit (HOSPITAL_COMMUNITY): Payer: Self-pay

## 2024-02-14 ENCOUNTER — Ambulatory Visit: Payer: Commercial Managed Care - PPO | Admitting: Neurology

## 2024-02-14 ENCOUNTER — Other Ambulatory Visit (HOSPITAL_COMMUNITY): Payer: Self-pay

## 2024-02-14 ENCOUNTER — Other Ambulatory Visit: Payer: Self-pay

## 2024-02-29 ENCOUNTER — Other Ambulatory Visit: Payer: Self-pay

## 2024-03-07 ENCOUNTER — Other Ambulatory Visit (HOSPITAL_COMMUNITY): Payer: Self-pay

## 2024-03-07 ENCOUNTER — Other Ambulatory Visit: Payer: Self-pay

## 2024-03-07 ENCOUNTER — Other Ambulatory Visit: Payer: Self-pay | Admitting: Neurology

## 2024-03-07 NOTE — Progress Notes (Signed)
 Specialty Pharmacy Refill Coordination Note  Julie Miranda is a 63 y.o. female contacted today regarding refills of specialty medication(s) OnabotulinumtoxinA  (BOTOX )   Patient requested Courier to Provider Office   Delivery date: 03/12/24   Verified address: [id number]  [place] [address]   Medication will be filled on 03/09/24.   This fill date is pending response to refill request from provider. Patient is aware and if they have not received fill by intended date, they must follow up with pharmacy.

## 2024-03-08 ENCOUNTER — Other Ambulatory Visit: Payer: Self-pay | Admitting: Neurology

## 2024-03-08 ENCOUNTER — Other Ambulatory Visit (HOSPITAL_COMMUNITY): Payer: Self-pay

## 2024-03-08 MED ORDER — BOTOX 200 UNITS IJ SOLR
200.0000 [IU] | INTRAMUSCULAR | 2 refills | Status: AC
  Filled 2024-03-08: qty 1, 90d supply, fill #0
  Filled 2024-08-22 – 2024-08-27 (×3): qty 1, 90d supply, fill #1
  Filled 2024-10-02: qty 1, 84d supply, fill #1

## 2024-03-08 NOTE — Telephone Encounter (Signed)
 Botox  refills sent to [facility]

## 2024-03-08 NOTE — Telephone Encounter (Signed)
 Please send Botox refills to [facility], thank you!

## 2024-03-09 ENCOUNTER — Other Ambulatory Visit (HOSPITAL_COMMUNITY): Payer: Self-pay

## 2024-03-12 ENCOUNTER — Other Ambulatory Visit (HOSPITAL_COMMUNITY): Payer: Self-pay

## 2024-03-12 ENCOUNTER — Telehealth: Payer: Self-pay | Admitting: Pharmacist

## 2024-03-12 NOTE — Telephone Encounter (Signed)
 Pharmacy Patient Advocate Encounter   Received notification from CoverMyMeds that prior authorization for Qulipta  60MG  tablets is required/requested.   Insurance verification completed.   The patient is insured through [facility] .   Per test claim: The current 30 day co-pay is, $0.00.  No PA needed at this time. This test claim was processed through [facility]- copay amounts may vary at other pharmacies due to pharmacy/plan contracts, or as the patient moves through the different stages of their insurance plan.

## 2024-03-14 ENCOUNTER — Ambulatory Visit: Payer: Commercial Managed Care - PPO | Admitting: Neurology

## 2024-03-20 ENCOUNTER — Other Ambulatory Visit (HOSPITAL_COMMUNITY): Payer: Self-pay

## 2024-03-20 DIAGNOSIS — F321 Major depressive disorder, single episode, moderate: Secondary | ICD-10-CM | POA: Diagnosis not present

## 2024-03-20 DIAGNOSIS — F411 Generalized anxiety disorder: Secondary | ICD-10-CM | POA: Diagnosis not present

## 2024-03-20 MED ORDER — FLUOXETINE HCL 20 MG PO CAPS
20.0000 mg | ORAL_CAPSULE | Freq: Every morning | ORAL | 1 refills | Status: AC
  Filled 2024-03-20: qty 30, 30d supply, fill #0
  Filled 2024-04-26 – 2024-06-28 (×19): qty 30, 30d supply, fill #1

## 2024-03-20 MED ORDER — TRAZODONE HCL 100 MG PO TABS
50.0000 mg | ORAL_TABLET | Freq: Every evening | ORAL | 1 refills | Status: AC | PRN
  Filled 2024-03-20 – 2024-06-04 (×2): qty 30, 30d supply, fill #0

## 2024-03-20 MED ORDER — BUSPIRONE HCL 15 MG PO TABS
15.0000 mg | ORAL_TABLET | Freq: Two times a day (BID) | ORAL | 1 refills | Status: AC
  Filled 2024-03-20: qty 60, 30d supply, fill #0
  Filled 2024-09-05: qty 60, 30d supply, fill #1

## 2024-03-23 ENCOUNTER — Other Ambulatory Visit (HOSPITAL_COMMUNITY): Payer: Self-pay

## 2024-04-09 ENCOUNTER — Ambulatory Visit: Admitting: Neurology

## 2024-04-09 VITALS — BP 120/78

## 2024-04-09 DIAGNOSIS — G43709 Chronic migraine without aura, not intractable, without status migrainosus: Secondary | ICD-10-CM | POA: Diagnosis not present

## 2024-04-09 MED ORDER — ONABOTULINUMTOXINA 200 UNITS IJ SOLR
155.0000 [IU] | Freq: Once | INTRAMUSCULAR | Status: AC
  Administered 2024-04-09: 155 [IU] via INTRAMUSCULAR

## 2024-04-09 MED ORDER — NERIVIO DEVI: 1.0000 | 12 refills | Status: DC

## 2024-04-09 NOTE — Progress Notes (Signed)
 Botox - 200 units x 1 vial Lot: [id number] Expiration: 2027/10 NDC: 0023-3921-02  Bacteriostatic 0.9% Sodium Chloride - 4 mL  Lot: [id number] Expiration: 04/23/25 NDC: [id number]  Dx: V25.366  S/P  Witnessed by : APRIL J RN

## 2024-04-09 NOTE — Progress Notes (Signed)
 BOTOX  PROCEDURE NOTE FOR MIGRAINE HEADACHE   HISTORY: Julie Miranda is here for Botox . Last was 12/20/23 with me. Is overdue for Botox . This will be her 4th cycle. With Botox , migraines less severe, able to keep working. With Botox , could be 0-3 a week. Before Botox , was having daily migraine. Nurtec works well but it has a bad taste.   Description of procedure:  The patient was placed in a sitting position. The standard protocol was used for Botox  as follows, with 5 units of Botox  injected at each site:   -Procerus muscle, midline injection  -Corrugator muscle, bilateral injection  -Frontalis muscle, bilateral injection, with 2 sites each side, medial injection was performed in the upper one third of the frontalis muscle, in the region vertical from the medial inferior edge of the superior orbital rim. The lateral injection was again in the upper one third of the forehead vertically above the lateral limbus of the cornea, 1.5 cm lateral to the medial injection site.  -Temporalis muscle injection, 4 sites, bilaterally. The first injection was 3 cm above the tragus of the ear, second injection site was 1.5 cm to 3 cm up from the first injection site in line with the tragus of the ear. The third injection site was 1.5-3 cm forward between the first 2 injection sites. The fourth injection site was 1.5 cm posterior to the second injection site.  -Occipitalis muscle injection, 3 sites, bilaterally. The first injection was done one half way between the occipital protuberance and the tip of the mastoid process behind the ear. The second injection site was done lateral and superior to the first, 1 fingerbreadth from the first injection. The third injection site was 1 fingerbreadth superiorly and medially from the first injection site.  -Cervical paraspinal muscle injection, 2 sites, bilateral, the first injection site was 1 cm from the midline of the cervical spine, 3 cm inferior to the lower  border of the occipital protuberance. The second injection site was 1.5 cm superiorly and laterally to the first injection site.  -Trapezius muscle injection was performed at 3 sites, bilaterally. The first injection site was in the upper trapezius muscle halfway between the inflection point of the neck, and the acromion. The second injection site was one half way between the acromion and the first injection site. The third injection was done between the first injection site and the inflection point of the neck.   A 200 unit bottle of Botox  was used, 155 units were injected, the rest of the Botox  was wasted. The patient tolerated the procedure well, there were no complications of the above procedure.  Botox  NDC 2952-8413-24 Lot number [id number] Expiration date 07/2026 SP  We will continue to, Qulipta  for migraine prevention along with Botox .  She will use Nurtec as needed.  I am going to send in a prescription for Nerivio neuromodulation device to used as dual use for prevention and acute treatment of migraines. Nurtec is helpful but leaves an awful taste in her mouth.   -Previously tried and failed: Aimovig , Topamax , Nurtec, nortriptyline , Effexor , Maxalt , Imitrex , Insurance wouldn't cover Relpax , Ubrelvy  was not effective. OTC: Tylenol , Motrin .   Meds ordered this encounter  Medications   botulinum toxin Type A  (BOTOX ) injection 155 Units    Botox - 200 units x 1 vial Lot: [id number] Expiration: 2027/10 NDC: 0023-3921-02  Bacteriostatic 0.9% Sodium Chloride - 4 mL  Lot: [id number] Expiration: 04/23/25 NDC: 9563875643  Dx: P29.518  S/P  Witnessed by : APRIL J RN  Nerve Stimulator (NERIVIO) DEVI    Sig: 1 Device by Does not apply route as directed.    Dispense:  1 each    Refill:  12    Dual use: 45 minute treatment every other day for prevention or at onset of migraine

## 2024-04-24 ENCOUNTER — Ambulatory Visit: Admitting: Neurology

## 2024-04-26 ENCOUNTER — Other Ambulatory Visit (HOSPITAL_COMMUNITY): Payer: Self-pay

## 2024-04-26 ENCOUNTER — Other Ambulatory Visit: Payer: Self-pay | Admitting: Neurology

## 2024-04-26 ENCOUNTER — Other Ambulatory Visit: Payer: Self-pay | Admitting: Family

## 2024-04-26 ENCOUNTER — Encounter: Payer: Self-pay | Admitting: Neurology

## 2024-04-26 ENCOUNTER — Encounter: Payer: Self-pay | Admitting: Physician Assistant

## 2024-04-26 ENCOUNTER — Other Ambulatory Visit: Payer: Self-pay

## 2024-04-26 DIAGNOSIS — E039 Hypothyroidism, unspecified: Secondary | ICD-10-CM

## 2024-04-26 DIAGNOSIS — I10 Essential (primary) hypertension: Secondary | ICD-10-CM

## 2024-04-26 DIAGNOSIS — G8929 Other chronic pain: Secondary | ICD-10-CM

## 2024-04-26 MED ORDER — LOSARTAN POTASSIUM 50 MG PO TABS
50.0000 mg | ORAL_TABLET | Freq: Every day | ORAL | 1 refills | Status: AC
  Filled 2024-04-26 – 2024-06-04 (×2): qty 90, 90d supply, fill #0
  Filled 2024-09-12: qty 90, 90d supply, fill #1

## 2024-04-26 MED ORDER — TIZANIDINE HCL 4 MG PO TABS
4.0000 mg | ORAL_TABLET | Freq: Four times a day (QID) | ORAL | 6 refills | Status: AC | PRN
  Filled 2024-04-26 – 2024-06-04 (×2): qty 30, 8d supply, fill #0

## 2024-04-26 MED ORDER — LEVOTHYROXINE SODIUM 150 MCG PO TABS
150.0000 ug | ORAL_TABLET | Freq: Every day | ORAL | 0 refills | Status: DC
  Filled 2024-04-26 – 2024-06-04 (×2): qty 90, 90d supply, fill #0

## 2024-05-03 ENCOUNTER — Other Ambulatory Visit (HOSPITAL_COMMUNITY): Payer: Self-pay

## 2024-05-03 ENCOUNTER — Other Ambulatory Visit: Payer: Self-pay | Admitting: Neurology

## 2024-05-03 NOTE — Telephone Encounter (Signed)
 Call to [facility],

## 2024-05-03 NOTE — Telephone Encounter (Signed)
 Ubaldo from [facility], [place] [address] has called for the Rx for the Nerve Stimulator (NERIVIO) DEVI for pt, their ph#[phone] fax#[phone]

## 2024-05-04 ENCOUNTER — Other Ambulatory Visit (HOSPITAL_COMMUNITY): Payer: Self-pay

## 2024-05-04 ENCOUNTER — Encounter: Payer: Self-pay | Admitting: Physician Assistant

## 2024-05-04 ENCOUNTER — Telehealth: Payer: Self-pay

## 2024-05-04 MED ORDER — NERIVIO DEVI: 1.0000 | 12 refills | Status: AC

## 2024-05-04 NOTE — Telephone Encounter (Signed)
 Pharmacy Patient Advocate Encounter   Received notification from Fax that prior authorization for Qulipta  60MG  tablets is required/requested.   Insurance verification completed.   The patient is insured through [facility] .   Per test claim: PA required; PA submitted to above mentioned insurance via CoverMyMeds Key/confirmation #/EOC [id number] Status is pending  WAITING FOR CLINICAL QUESTIONS TO POPULATE.

## 2024-05-08 ENCOUNTER — Other Ambulatory Visit (HOSPITAL_COMMUNITY): Payer: Self-pay

## 2024-05-10 ENCOUNTER — Other Ambulatory Visit (HOSPITAL_COMMUNITY): Payer: Self-pay

## 2024-05-10 DIAGNOSIS — F411 Generalized anxiety disorder: Secondary | ICD-10-CM | POA: Diagnosis not present

## 2024-05-10 DIAGNOSIS — F321 Major depressive disorder, single episode, moderate: Secondary | ICD-10-CM | POA: Diagnosis not present

## 2024-05-10 MED ORDER — BUSPIRONE HCL 15 MG PO TABS
15.0000 mg | ORAL_TABLET | Freq: Two times a day (BID) | ORAL | 1 refills | Status: AC
  Filled 2024-05-10 – 2024-06-04 (×2): qty 60, 30d supply, fill #0
  Filled 2024-10-05: qty 60, 30d supply, fill #1

## 2024-05-10 MED ORDER — FLUOXETINE HCL 20 MG PO CAPS
20.0000 mg | ORAL_CAPSULE | ORAL | 1 refills | Status: AC
  Filled 2024-05-10 – 2024-06-04 (×2): qty 30, 30d supply, fill #0
  Filled 2024-06-28 – 2024-08-13 (×12): qty 30, 30d supply, fill #1
  Filled ????-??-??: fill #1

## 2024-05-10 MED ORDER — TRAZODONE HCL 100 MG PO TABS
50.0000 mg | ORAL_TABLET | Freq: Every evening | ORAL | 1 refills | Status: AC | PRN
  Filled 2024-05-10 – 2024-07-19 (×3): qty 30, 30d supply, fill #0

## 2024-05-17 NOTE — Telephone Encounter (Signed)
 Pharmacy Patient Advocate Encounter  Received notification from [facility] that Prior Authorization for Qulipta  60MG  tablets has been APPROVED from 05/17/2024 to 05/17/2025   PA #/Case ID/Reference #: PA Case ID #: [id number]

## 2024-05-22 ENCOUNTER — Other Ambulatory Visit (HOSPITAL_COMMUNITY): Payer: Self-pay

## 2024-05-28 ENCOUNTER — Other Ambulatory Visit: Payer: Self-pay

## 2024-06-04 ENCOUNTER — Other Ambulatory Visit: Payer: Self-pay | Admitting: Family

## 2024-06-04 ENCOUNTER — Encounter: Payer: Self-pay | Admitting: Physician Assistant

## 2024-06-04 ENCOUNTER — Other Ambulatory Visit: Payer: Self-pay

## 2024-06-04 ENCOUNTER — Other Ambulatory Visit (HOSPITAL_COMMUNITY): Payer: Self-pay

## 2024-06-04 DIAGNOSIS — G8929 Other chronic pain: Secondary | ICD-10-CM

## 2024-06-05 ENCOUNTER — Other Ambulatory Visit: Payer: Self-pay

## 2024-06-05 ENCOUNTER — Other Ambulatory Visit (HOSPITAL_COMMUNITY): Payer: Self-pay

## 2024-06-06 ENCOUNTER — Other Ambulatory Visit (HOSPITAL_COMMUNITY): Payer: Self-pay

## 2024-06-07 ENCOUNTER — Other Ambulatory Visit (HOSPITAL_COMMUNITY): Payer: Self-pay

## 2024-06-08 ENCOUNTER — Other Ambulatory Visit (HOSPITAL_COMMUNITY): Payer: Self-pay

## 2024-06-09 ENCOUNTER — Other Ambulatory Visit (HOSPITAL_COMMUNITY): Payer: Self-pay

## 2024-06-11 ENCOUNTER — Telehealth: Payer: Self-pay | Admitting: Pharmacist

## 2024-06-11 ENCOUNTER — Other Ambulatory Visit (HOSPITAL_COMMUNITY): Payer: Self-pay

## 2024-06-11 NOTE — Telephone Encounter (Signed)
 Pharmacy Patient Advocate Encounter  Received notification from [facility] that Prior Authorization for Nurtec 75MG  dispersible tablets has been APPROVED from 06/11/2024 to 06/11/2025   PA #/Case ID/Reference #: [id number]

## 2024-06-11 NOTE — Telephone Encounter (Signed)
 Pharmacy Patient Advocate Encounter   Received notification from CoverMyMeds that prior authorization for Nurtec 75MG  dispersible tablets is required/requested.   Insurance verification completed.   The patient is insured through [facility] .   Per test claim: PA required; PA submitted to above mentioned insurance via Latent Key/confirmation #/EOC [facility] Status is pending

## 2024-06-12 ENCOUNTER — Other Ambulatory Visit (HOSPITAL_COMMUNITY): Payer: Self-pay

## 2024-06-13 ENCOUNTER — Other Ambulatory Visit (HOSPITAL_COMMUNITY): Payer: Self-pay

## 2024-06-14 ENCOUNTER — Other Ambulatory Visit (HOSPITAL_COMMUNITY): Payer: Self-pay

## 2024-06-15 ENCOUNTER — Other Ambulatory Visit (HOSPITAL_COMMUNITY): Payer: Self-pay

## 2024-06-16 ENCOUNTER — Other Ambulatory Visit (HOSPITAL_COMMUNITY): Payer: Self-pay

## 2024-06-18 ENCOUNTER — Other Ambulatory Visit: Payer: Self-pay

## 2024-06-18 ENCOUNTER — Other Ambulatory Visit (HOSPITAL_COMMUNITY): Payer: Self-pay

## 2024-06-19 ENCOUNTER — Other Ambulatory Visit (HOSPITAL_COMMUNITY): Payer: Self-pay

## 2024-06-20 ENCOUNTER — Other Ambulatory Visit: Payer: Self-pay | Admitting: Family

## 2024-06-20 ENCOUNTER — Other Ambulatory Visit (HOSPITAL_COMMUNITY): Payer: Self-pay

## 2024-06-20 DIAGNOSIS — G8929 Other chronic pain: Secondary | ICD-10-CM

## 2024-06-21 ENCOUNTER — Other Ambulatory Visit: Payer: Self-pay

## 2024-06-21 ENCOUNTER — Other Ambulatory Visit (HOSPITAL_COMMUNITY): Payer: Self-pay

## 2024-06-21 ENCOUNTER — Encounter: Payer: Self-pay | Admitting: Pharmacist

## 2024-06-21 MED ORDER — GABAPENTIN 100 MG PO CAPS
300.0000 mg | ORAL_CAPSULE | Freq: Every day | ORAL | 5 refills | Status: AC
  Filled 2024-06-21: qty 180, 60d supply, fill #0

## 2024-06-22 ENCOUNTER — Other Ambulatory Visit (HOSPITAL_COMMUNITY): Payer: Self-pay

## 2024-06-26 ENCOUNTER — Other Ambulatory Visit (HOSPITAL_COMMUNITY): Payer: Self-pay

## 2024-06-27 ENCOUNTER — Other Ambulatory Visit: Payer: Self-pay

## 2024-06-27 ENCOUNTER — Other Ambulatory Visit (HOSPITAL_COMMUNITY): Payer: Self-pay

## 2024-06-28 ENCOUNTER — Other Ambulatory Visit (HOSPITAL_COMMUNITY): Payer: Self-pay

## 2024-06-29 ENCOUNTER — Other Ambulatory Visit (HOSPITAL_COMMUNITY): Payer: Self-pay

## 2024-07-02 ENCOUNTER — Other Ambulatory Visit (HOSPITAL_COMMUNITY): Payer: Self-pay

## 2024-07-05 ENCOUNTER — Other Ambulatory Visit (HOSPITAL_COMMUNITY): Payer: Self-pay

## 2024-07-05 DIAGNOSIS — F411 Generalized anxiety disorder: Secondary | ICD-10-CM | POA: Diagnosis not present

## 2024-07-05 DIAGNOSIS — F321 Major depressive disorder, single episode, moderate: Secondary | ICD-10-CM | POA: Diagnosis not present

## 2024-07-05 MED ORDER — BUSPIRONE HCL 15 MG PO TABS
15.0000 mg | ORAL_TABLET | Freq: Three times a day (TID) | ORAL | 1 refills | Status: AC
  Filled 2024-07-06 – 2024-07-10 (×4): qty 90, 30d supply, fill #0

## 2024-07-05 MED ORDER — TRAZODONE HCL 100 MG PO TABS
50.0000 mg | ORAL_TABLET | Freq: Every evening | ORAL | 1 refills | Status: AC | PRN
  Filled 2024-07-05 – 2024-08-07 (×2): qty 30, 30d supply, fill #0

## 2024-07-05 MED ORDER — FLUOXETINE HCL 20 MG PO CAPS
20.0000 mg | ORAL_CAPSULE | Freq: Every morning | ORAL | 1 refills | Status: AC
  Filled 2024-07-21 – 2024-08-07 (×7): qty 30, 30d supply, fill #0

## 2024-07-06 ENCOUNTER — Other Ambulatory Visit (HOSPITAL_COMMUNITY): Payer: Self-pay

## 2024-07-07 ENCOUNTER — Other Ambulatory Visit (HOSPITAL_COMMUNITY): Payer: Self-pay

## 2024-07-09 ENCOUNTER — Other Ambulatory Visit (HOSPITAL_COMMUNITY): Payer: Self-pay

## 2024-07-09 ENCOUNTER — Other Ambulatory Visit: Payer: Self-pay

## 2024-07-10 ENCOUNTER — Other Ambulatory Visit (HOSPITAL_COMMUNITY): Payer: Self-pay

## 2024-07-10 ENCOUNTER — Other Ambulatory Visit: Payer: Self-pay

## 2024-07-16 ENCOUNTER — Other Ambulatory Visit (HOSPITAL_COMMUNITY): Payer: Self-pay

## 2024-07-19 ENCOUNTER — Encounter: Payer: Self-pay | Admitting: Physician Assistant

## 2024-07-19 ENCOUNTER — Other Ambulatory Visit (HOSPITAL_COMMUNITY): Payer: Self-pay

## 2024-07-21 ENCOUNTER — Other Ambulatory Visit (HOSPITAL_COMMUNITY): Payer: Self-pay

## 2024-07-23 ENCOUNTER — Other Ambulatory Visit (HOSPITAL_COMMUNITY): Payer: Self-pay

## 2024-07-24 ENCOUNTER — Other Ambulatory Visit (HOSPITAL_COMMUNITY): Payer: Self-pay

## 2024-07-24 ENCOUNTER — Encounter: Payer: Self-pay | Admitting: Pharmacist

## 2024-07-24 ENCOUNTER — Other Ambulatory Visit: Payer: Self-pay

## 2024-07-25 ENCOUNTER — Other Ambulatory Visit: Payer: Self-pay

## 2024-07-25 ENCOUNTER — Other Ambulatory Visit (HOSPITAL_COMMUNITY): Payer: Self-pay

## 2024-07-26 ENCOUNTER — Other Ambulatory Visit (HOSPITAL_COMMUNITY): Payer: Self-pay

## 2024-07-27 ENCOUNTER — Other Ambulatory Visit: Payer: Self-pay

## 2024-07-27 ENCOUNTER — Other Ambulatory Visit (HOSPITAL_COMMUNITY): Payer: Self-pay

## 2024-07-27 ENCOUNTER — Encounter: Payer: Self-pay | Admitting: Pharmacist

## 2024-07-28 ENCOUNTER — Other Ambulatory Visit (HOSPITAL_COMMUNITY): Payer: Self-pay

## 2024-08-01 ENCOUNTER — Other Ambulatory Visit: Payer: Self-pay

## 2024-08-01 DIAGNOSIS — H524 Presbyopia: Secondary | ICD-10-CM | POA: Diagnosis not present

## 2024-08-01 DIAGNOSIS — H2513 Age-related nuclear cataract, bilateral: Secondary | ICD-10-CM | POA: Diagnosis not present

## 2024-08-01 DIAGNOSIS — H52209 Unspecified astigmatism, unspecified eye: Secondary | ICD-10-CM | POA: Diagnosis not present

## 2024-08-02 ENCOUNTER — Other Ambulatory Visit: Payer: Self-pay

## 2024-08-02 ENCOUNTER — Other Ambulatory Visit (HOSPITAL_COMMUNITY): Payer: Self-pay

## 2024-08-02 ENCOUNTER — Encounter: Payer: Self-pay | Admitting: Pharmacist

## 2024-08-02 DIAGNOSIS — Z7689 Persons encountering health services in other specified circumstances: Secondary | ICD-10-CM | POA: Diagnosis not present

## 2024-08-02 DIAGNOSIS — M19041 Primary osteoarthritis, right hand: Secondary | ICD-10-CM | POA: Diagnosis not present

## 2024-08-02 DIAGNOSIS — Z23 Encounter for immunization: Secondary | ICD-10-CM | POA: Diagnosis not present

## 2024-08-02 DIAGNOSIS — G629 Polyneuropathy, unspecified: Secondary | ICD-10-CM | POA: Diagnosis not present

## 2024-08-02 DIAGNOSIS — M19042 Primary osteoarthritis, left hand: Secondary | ICD-10-CM | POA: Diagnosis not present

## 2024-08-02 MED ORDER — GABAPENTIN 100 MG PO CAPS
100.0000 mg | ORAL_CAPSULE | Freq: Three times a day (TID) | ORAL | 0 refills | Status: DC
  Filled 2024-08-02 – 2024-08-14 (×4): qty 270, 90d supply, fill #0

## 2024-08-02 MED ORDER — MELOXICAM 15 MG PO TABS
15.0000 mg | ORAL_TABLET | Freq: Every day | ORAL | 3 refills | Status: AC
  Filled 2024-08-02 – 2024-08-08 (×3): qty 90, 90d supply, fill #0
  Filled 2024-11-04: qty 90, 90d supply, fill #1

## 2024-08-07 ENCOUNTER — Other Ambulatory Visit: Payer: Self-pay

## 2024-08-07 ENCOUNTER — Other Ambulatory Visit (HOSPITAL_COMMUNITY): Payer: Self-pay

## 2024-08-08 ENCOUNTER — Other Ambulatory Visit: Payer: Self-pay

## 2024-08-08 ENCOUNTER — Encounter: Payer: Self-pay | Admitting: Pharmacist

## 2024-08-08 ENCOUNTER — Other Ambulatory Visit (HOSPITAL_COMMUNITY): Payer: Self-pay

## 2024-08-09 ENCOUNTER — Other Ambulatory Visit (HOSPITAL_COMMUNITY): Payer: Self-pay

## 2024-08-09 ENCOUNTER — Other Ambulatory Visit: Payer: Self-pay

## 2024-08-10 ENCOUNTER — Other Ambulatory Visit (HOSPITAL_COMMUNITY): Payer: Self-pay

## 2024-08-11 ENCOUNTER — Other Ambulatory Visit (HOSPITAL_COMMUNITY): Payer: Self-pay

## 2024-08-12 ENCOUNTER — Other Ambulatory Visit (HOSPITAL_COMMUNITY): Payer: Self-pay

## 2024-08-12 ENCOUNTER — Other Ambulatory Visit: Payer: Self-pay | Admitting: Family

## 2024-08-12 DIAGNOSIS — H6690 Otitis media, unspecified, unspecified ear: Secondary | ICD-10-CM

## 2024-08-13 ENCOUNTER — Other Ambulatory Visit: Payer: Self-pay

## 2024-08-13 ENCOUNTER — Other Ambulatory Visit (HOSPITAL_COMMUNITY): Payer: Self-pay

## 2024-08-14 ENCOUNTER — Other Ambulatory Visit: Payer: Self-pay

## 2024-08-14 ENCOUNTER — Other Ambulatory Visit (HOSPITAL_COMMUNITY): Payer: Self-pay

## 2024-08-14 MED ORDER — AZELASTINE HCL 0.1 % NA SOLN
1.0000 | Freq: Two times a day (BID) | NASAL | 12 refills | Status: AC
  Filled 2024-08-14: qty 30, 90d supply, fill #0
  Filled 2024-11-07: qty 30, 90d supply, fill #1

## 2024-08-21 DIAGNOSIS — H40013 Open angle with borderline findings, low risk, bilateral: Secondary | ICD-10-CM | POA: Diagnosis not present

## 2024-08-21 DIAGNOSIS — H2513 Age-related nuclear cataract, bilateral: Secondary | ICD-10-CM | POA: Diagnosis not present

## 2024-08-22 ENCOUNTER — Other Ambulatory Visit (HOSPITAL_COMMUNITY): Payer: Self-pay

## 2024-08-22 ENCOUNTER — Other Ambulatory Visit: Payer: Self-pay

## 2024-08-23 ENCOUNTER — Other Ambulatory Visit: Payer: Self-pay

## 2024-08-23 NOTE — Progress Notes (Signed)
 PA request from [facility]

## 2024-08-24 ENCOUNTER — Other Ambulatory Visit: Payer: Self-pay

## 2024-08-27 ENCOUNTER — Other Ambulatory Visit: Payer: Self-pay

## 2024-08-27 ENCOUNTER — Other Ambulatory Visit (HOSPITAL_COMMUNITY): Payer: Self-pay

## 2024-08-27 ENCOUNTER — Other Ambulatory Visit: Payer: Self-pay | Admitting: Family

## 2024-08-27 DIAGNOSIS — E039 Hypothyroidism, unspecified: Secondary | ICD-10-CM

## 2024-08-27 DIAGNOSIS — K219 Gastro-esophageal reflux disease without esophagitis: Secondary | ICD-10-CM

## 2024-08-27 MED ORDER — LEVOTHYROXINE SODIUM 150 MCG PO TABS
150.0000 ug | ORAL_TABLET | Freq: Every day | ORAL | 0 refills | Status: AC
  Filled 2024-08-27: qty 90, 90d supply, fill #0

## 2024-08-27 MED ORDER — PANTOPRAZOLE SODIUM 40 MG PO TBEC
40.0000 mg | DELAYED_RELEASE_TABLET | Freq: Every day | ORAL | 3 refills | Status: AC
  Filled 2024-08-27: qty 90, 90d supply, fill #0
  Filled 2024-11-25: qty 90, 90d supply, fill #1

## 2024-08-29 ENCOUNTER — Other Ambulatory Visit: Payer: Self-pay

## 2024-08-30 ENCOUNTER — Other Ambulatory Visit (HOSPITAL_COMMUNITY): Payer: Self-pay

## 2024-08-30 ENCOUNTER — Other Ambulatory Visit: Payer: Self-pay

## 2024-08-30 ENCOUNTER — Telehealth: Payer: Self-pay

## 2024-08-30 NOTE — Telephone Encounter (Signed)
   The pharmacy states PT needs a prior authorization for Botox -please advise.

## 2024-08-30 NOTE — Telephone Encounter (Signed)
 Noted! Thank you

## 2024-09-03 ENCOUNTER — Other Ambulatory Visit: Payer: Self-pay

## 2024-09-06 ENCOUNTER — Other Ambulatory Visit: Payer: Self-pay

## 2024-09-07 ENCOUNTER — Other Ambulatory Visit (HOSPITAL_COMMUNITY): Payer: Self-pay

## 2024-09-12 ENCOUNTER — Other Ambulatory Visit: Payer: Self-pay

## 2024-09-25 ENCOUNTER — Other Ambulatory Visit (HOSPITAL_COMMUNITY): Payer: Self-pay

## 2024-09-25 DIAGNOSIS — F411 Generalized anxiety disorder: Secondary | ICD-10-CM | POA: Diagnosis not present

## 2024-09-25 DIAGNOSIS — F321 Major depressive disorder, single episode, moderate: Secondary | ICD-10-CM | POA: Diagnosis not present

## 2024-09-25 MED ORDER — BUSPIRONE HCL 15 MG PO TABS
15.0000 mg | ORAL_TABLET | Freq: Three times a day (TID) | ORAL | 1 refills | Status: AC
  Filled 2024-09-25: qty 90, 30d supply, fill #0

## 2024-09-25 MED ORDER — FLUOXETINE HCL 40 MG PO CAPS
40.0000 mg | ORAL_CAPSULE | Freq: Every morning | ORAL | 1 refills | Status: AC
  Filled 2024-09-25 – 2024-10-05 (×2): qty 30, 30d supply, fill #0
  Filled 2024-10-28 – 2024-11-08 (×4): qty 30, 30d supply, fill #1

## 2024-09-25 MED ORDER — TRAZODONE HCL 100 MG PO TABS
50.0000 mg | ORAL_TABLET | Freq: Every day | ORAL | 1 refills | Status: AC
  Filled 2024-09-25 – 2024-10-05 (×2): qty 30, 30d supply, fill #0
  Filled 2024-10-28 – 2024-11-08 (×4): qty 30, 30d supply, fill #1

## 2024-09-26 ENCOUNTER — Encounter: Admitting: Family

## 2024-10-01 ENCOUNTER — Telehealth: Payer: Self-pay | Admitting: Neurology

## 2024-10-01 NOTE — Telephone Encounter (Signed)
 Called Pt, No Answer  LVM for Pt to call office  to discuss billing

## 2024-10-01 NOTE — Telephone Encounter (Signed)
 Submitted auth request via CMM, status is pending for urgent review. Key: [id number]

## 2024-10-02 ENCOUNTER — Other Ambulatory Visit: Payer: Self-pay

## 2024-10-02 NOTE — Progress Notes (Signed)
 Specialty Pharmacy Refill Coordination Note  Julie Miranda is a 63 y.o. female assessed today regarding refills of clinic administered specialty medication(s) OnabotulinumtoxinA  (Botox )   Clinic requested Courier to Provider Office   Delivery date: 10/03/24   Verified address: [id number]  [place] [address]   Medication will be filled on: 10/02/24  Spoke with J.Martin at [facility]

## 2024-10-02 NOTE — Telephone Encounter (Signed)
 Auth#: [id number] (10/01/24-09/30/25)

## 2024-10-03 ENCOUNTER — Ambulatory Visit (HOSPITAL_COMMUNITY)
Admission: RE | Admit: 2024-10-03 | Discharge: 2024-10-03 | Disposition: A | Discharge status: Home / Self Care | Payer: Self-pay | Source: Ambulatory Visit | Attending: Nurse Practitioner | Admitting: Nurse Practitioner

## 2024-10-03 ENCOUNTER — Ambulatory Visit: Admitting: Neurology

## 2024-10-03 ENCOUNTER — Other Ambulatory Visit (HOSPITAL_COMMUNITY): Payer: Self-pay

## 2024-10-03 ENCOUNTER — Encounter (HOSPITAL_COMMUNITY): Payer: Self-pay | Admitting: Nurse Practitioner

## 2024-10-03 ENCOUNTER — Other Ambulatory Visit (HOSPITAL_COMMUNITY): Payer: Self-pay | Admitting: Nurse Practitioner

## 2024-10-03 VITALS — BP 157/83

## 2024-10-03 DIAGNOSIS — S638X2D Sprain of other part of left wrist and hand, subsequent encounter: Secondary | ICD-10-CM

## 2024-10-03 DIAGNOSIS — S638X1D Sprain of other part of right wrist and hand, subsequent encounter: Secondary | ICD-10-CM | POA: Insufficient documentation

## 2024-10-03 DIAGNOSIS — G43709 Chronic migraine without aura, not intractable, without status migrainosus: Secondary | ICD-10-CM | POA: Diagnosis not present

## 2024-10-03 MED ORDER — QULIPTA 60 MG PO TABS
60.0000 mg | ORAL_TABLET | Freq: Every day | ORAL | 11 refills | Status: AC
  Filled 2024-10-03 – 2024-10-13 (×2): qty 30, 30d supply, fill #0
  Filled 2024-11-08: qty 30, 30d supply, fill #1

## 2024-10-03 MED ORDER — NURTEC 75 MG PO TBDP
75.0000 mg | ORAL_TABLET | ORAL | 11 refills | Status: AC | PRN
  Filled 2024-10-03: qty 8, 30d supply, fill #0

## 2024-10-03 MED ORDER — ONABOTULINUMTOXINA 200 UNITS IJ SOLR
155.0000 [IU] | Freq: Once | INTRAMUSCULAR | Status: AC
  Administered 2024-10-03: 155 [IU] via INTRAMUSCULAR

## 2024-10-03 NOTE — Progress Notes (Signed)
 Botox - 200 units x 1 vial Lot: [id number] Expiration: 2027/06 NDC: 0023-3921-02  Bacteriostatic 0.9% Sodium Chloride - 4 mL  Lot: [id number] Expiration: 08/24/25 NDC: [id number]  Dx: H56.290  S/P  Witnessed by JINNY ROYS RMA

## 2024-10-03 NOTE — Progress Notes (Signed)
° ° °  BOTOX  PROCEDURE NOTE FOR MIGRAINE HEADACHE  HISTORY: Julie Miranda is here for Botox . Last was 04/09/24 with me. Remains on Qulipta  60 mg daily. Nurtec PRN. Topamax  100 mg BID. Tizanidine , zofran  PRN.  Nerivio PRN, works great!! A lot of stress lately. 2-4 migraines days a week. Feels like headaches are worse with Botox  being lately, have been more severe. Works 5 days a week, 12 hours shifts.   Description of procedure:  The patient was placed in a sitting position. The standard protocol was used for Botox  as follows, with 5 units of Botox  injected at each site:  -Procerus muscle, midline injection  -Corrugator muscle, bilateral injection  -Frontalis muscle, bilateral injection, with 2 sites each side, medial injection was performed in the upper one third of the frontalis muscle, in the region vertical from the medial inferior edge of the superior orbital rim. The lateral injection was again in the upper one third of the forehead vertically above the lateral limbus of the cornea, 1.5 cm lateral to the medial injection site.  -Temporalis muscle injection, 4 sites, bilaterally. The first injection was 3 cm above the tragus of the ear, second injection site was 1.5 cm to 3 cm up from the first injection site in line with the tragus of the ear. The third injection site was 1.5-3 cm forward between the first 2 injection sites. The fourth injection site was 1.5 cm posterior to the second injection site.  -Occipitalis muscle injection, 3 sites, bilaterally. The first injection was done one half way between the occipital protuberance and the tip of the mastoid process behind the ear. The second injection site was done lateral and superior to the first, 1 fingerbreadth from the first injection. The third injection site was 1 fingerbreadth superiorly and medially from the first injection site.  -Cervical paraspinal muscle injection, 2 sites, bilateral, the first injection site was 1 cm from the  midline of the cervical spine, 3 cm inferior to the lower border of the occipital protuberance. The second injection site was 1.5 cm superiorly and laterally to the first injection site.  -Trapezius muscle injection was performed at 3 sites, bilaterally. The first injection site was in the upper trapezius muscle halfway between the inflection point of the neck, and the acromion. The second injection site was one half way between the acromion and the first injection site. The third injection was done between the first injection site and the inflection point of the neck.   A 200 unit bottle of Botox  was used, 155 units were injected, the rest of the Botox  was wasted. The patient tolerated the procedure well, there were no complications of the above procedure.  Botox  NDC 9976-6078-97 Lot number [id number] Expiration date 03/2026 SP

## 2024-10-03 NOTE — Progress Notes (Signed)
 Patient: Julie Miranda Date of Birth: 29-Oct-1960  Reason for Visit: Follow up History from: Patient Primary Neurologist: Dr. Onita   ASSESSMENT AND PLAN 63 y.o. year old female   1.  Chronic migraine headache - We did Botox  today, we will plan to take a Botox  break, finds Botox  injection to be quite uncomfortable, she would like to see how she does long-term without Botox  - Continue Qulipta  60 mg daily for migraine prevention - Continue Topamax  100 mg twice a day - Continue Nerivio as needed for acute migraine - Continue Nurtec 75 mg as needed for acute migraine, may combine with Zofran , tizanidine  - Previously tried and failed: Aimovig , Topamax , Nurtec, nortriptyline , Effexor , Maxalt , Imitrex  - Follow-up in 6 months  Meds ordered this encounter  Medications   botulinum toxin Type A  (BOTOX ) injection 155 Units    Botox - 200 units x 1 vial Lot: [id number] Expiration: 2027/06 NDC: 0023-3921-02  Bacteriostatic 0.9% Sodium Chloride - 4 mL  Lot: [id number] Expiration: 08/24/25 NDC: 9590803397  Dx: H56.290  S/P  Witnessed by JINNY ROYS RMA   Atogepant  (QULIPTA ) 60 MG TABS    Sig: Take 1 tablet by mouth daily.    Dispense:  30 tablet    Refill:  11   Rimegepant Sulfate  (NURTEC) 75 MG TBDP    Sig: Take 1 tablet (75 mg total) by mouth as needed.    Dispense:  8 tablet    Refill:  11     HISTORY  Julie Miranda is a 63 year old female, seen in request by her primary care physician nurse practitioner Kip Ade, for evaluation of chronic migraine, initial evaluation was April 30, 2020.  She works as a agricultural engineer for [employer]   I reviewed and summarized the referring note. She had a history of hypothyroidism, apparently noncompliant with her medications, multiple elevated TSH level, sometimes I run out of the medication   She reported lifelong history of migraine headaches, lateralized severe pounding headache with associated light noise sensitivity,  nauseous, is usually stay on the right side, occasionally on the left side,   Over the years, she reported gradual worsening migraine headaches, half time of the month, she has to deal with different degree of migraine headaches, she tends to take 2 tablets of Excedrin Migraine, twice a day to help her migraine, sometimes I sleep with migraine, wake up with migraine   She was giving nortriptyline  25 mg as preventive medication, which did help her her sleep better, but she ran out of the medicine, has stopped taking it, not sure about the benefit for migraine   Laboratory evaluation showed normal CBC, CMP, TSH, was significantly elevated at 16.7,   UPDATE Dec 16 2020: She is accompanied by her mother Julie Miranda at today's clinical visit, she complains of worsening stress, depression anxiety, headaches, is taking Celexa  20 mg daily, however never tried take nortriptyline  regularly, she described nortriptyline  did help her go to sleep, but feeling tired after waking up,   She was recently defined to have significant iron  deficiency anemia, ferritin level was only 4, hemoglobin of 8.1, receiving iron  infusion, which has helped her symptoms, is going through GI evaluation for potential GI loss.   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 Onita 04/15/22, ordered Aimovig  70 mg, this is her 2nd month, she just did it. Is a behavorial health tech, hasn't missed any work, works overtime, a lot of personal stress. Daily headache. 85% are significant headaches. Pounding headaches, lights are bothersome. Nurtec doesn't help at all. On topamax  100 mg twice daily. Stopped taking Excedrin migraine. No more OTC medications. Doesn't sleep well, has insomnia.   Update October 28, 2022 SS: Stress at work, not getting as much overtime, cousin had heart attack, has to get new PCP. Remains on Qulipta  60 mg but not taking daily, only takes when has headache. 3-4 headaches a week. No longer daily headache. Severity depends. Thinks mood is better. We had to try Maxalt  before insurance would cover Relpax . Never able to get Relpax  due to insurance but now new plan start of year. Has been taking Tylenol , actually helps. Is in school for mental health counseling. Remains on Topamax  100 mg BID.  Update 10/03/24 SS: Here for Botox , last was 04/09/24 with me.  On Qulipta  60 mg daily, Topamax  100 mg twice daily, Nurtec/Zofran /tizanidine  as needed.  Uses Nerivio as needed with excellent benefit.  Currently having about 2-4 migraine days a week.  REVIEW OF SYSTEMS: Out of a complete 14 system review of symptoms, the patient complains only of the following symptoms, and all other reviewed systems are negative.  See HPI  ALLERGIES: Allergies  Allergen Reactions   Molds & Smuts Shortness Of Breath   Morphine  And Codeine  Nausea And Vomiting    HOME MEDICATIONS: Outpatient Medications Prior to Visit  Medication Sig Dispense Refill   albuterol  (VENTOLIN  HFA) 108 (90 Base) MCG/ACT inhaler Inhale 2 puffs into the lungs every 4 (four) hours as needed for shortness of breath 6.7 g 0   atorvastatin  (LIPITOR) 40 MG tablet Take 1 tablet (40 mg total) by mouth daily. 90 tablet 3   azelastine  (ASTELIN ) 0.1 % nasal spray Place 1 spray into both nostrils 2  (two) times daily as directed 30 mL 12   botulinum toxin Type A  (BOTOX ) 200 units injection Inject 200 Units into the muscle every 3 (three) months. 1 each 2   busPIRone  (BUSPAR ) 10 MG tablet Take 1 tablet (10 mg total) by mouth 2 (two) times daily. For anxiety. 180 tablet 1   busPIRone  (BUSPAR ) 15 MG tablet Take 1 tablet (15 mg total) by mouth 2 (two) times daily for anxiety. 60 tablet 1   busPIRone  (BUSPAR ) 15 MG tablet Take 1 tablet (15 mg total) by mouth 2 (two) times daily for anxiety. 60 tablet 1   busPIRone  (BUSPAR ) 15 MG tablet Take 1 tablet (15 mg total) by mouth 2 (two) times daily for anxiety 60 tablet 1   busPIRone  (BUSPAR ) 15 MG tablet Take 1 tablet (15 mg total) by mouth 2 (two) times daily for anxiety. 60 tablet 1   busPIRone  (BUSPAR ) 15 MG tablet Take 1 tablet (15 mg total) by mouth 3 (three) times daily for anxiety. 90 tablet 1   busPIRone  (BUSPAR ) 15 MG tablet Take 1 tablet (15 mg total) by mouth 3 (three) times daily  for anxiety. 90 tablet 1   eletriptan  (RELPAX ) 20 MG tablet Take 1 tablet (20 mg total) by mouth as needed for migraine or headache. May repeat in 2 hours if headache persists or recurs. 10 tablet 11   FLUoxetine  (PROZAC ) 10 MG capsule Take 1 capsule (10 mg total) by mouth every morning after a meal 30 capsule 0   FLUoxetine  (PROZAC ) 20 MG capsule Take 1 capsule (20 mg total) by mouth every morning after a meal. 30 capsule 1   FLUoxetine  (PROZAC ) 20 MG capsule Take 1 capsule (20 mg total) by mouth every morning after meals 30 capsule 1   FLUoxetine  (PROZAC ) 20 MG capsule Take 1 capsule (20 mg total) by mouth every morning after meals. 30 capsule 1   FLUoxetine  (PROZAC ) 20 MG capsule Take 1 capsule (20 mg total) by mouth every morning after meals. 30 capsule 1   FLUoxetine  (PROZAC ) 40 MG capsule Take 1 capsule (40 mg total) by mouth in the morning after meal. 30 capsule 1   gabapentin  (NEURONTIN ) 100 MG capsule Take 3 capsules (300 mg total) by mouth at bedtime. 180  capsule 5   gabapentin  (NEURONTIN ) 100 MG capsule Take 1 capsule (100 mg total) by mouth 3 (three) times daily. 270 capsule 0   levothyroxine  (SYNTHROID ) 150 MCG tablet Take 1 tablet (150 mcg total) by mouth daily before breakfast. 90 tablet 0   losartan  (COZAAR ) 50 MG tablet Take 1 tablet (50 mg total) by mouth daily. 90 tablet 1   meloxicam  (MOBIC ) 15 MG tablet Take 1 tablet (15 mg total) by mouth daily. 90 tablet 3   Nerve Stimulator (NERIVIO) DEVI 1 Device by Does not apply route as directed. 1 each 12   ondansetron  (ZOFRAN ) 4 MG tablet Take 1 tablet (4 mg total) by mouth every 8 (eight) hours as needed for nausea or vomiting. 20 tablet 2   pantoprazole  (PROTONIX ) 40 MG tablet Take 1 tablet (40 mg total) by mouth daily. 90 tablet 3   tiZANidine  (ZANAFLEX ) 4 MG tablet Take 1 tablet (4 mg total) by mouth every 6 (six) hours as needed for muscle spasms. 30 tablet 6   topiramate  (TOPAMAX ) 100 MG tablet Take 1 tablet (100 mg total) by mouth 2 (two) times daily. 180 tablet 3   traZODone  (DESYREL ) 100 MG tablet Take 1/2 - 1 tablets (50-100 mg total) by mouth at bedtime as needed for sleep 30 tablet 1   traZODone  (DESYREL ) 100 MG tablet Take 1/2-1 tablet (50-100 mg total) by mouth at bedtime as needed for sleep. 30 tablet 1   traZODone  (DESYREL ) 100 MG tablet Take 1/2-1 tablet (50-100 mg total) by mouth at bedtime as needed for sleep. 30 tablet 1   traZODone  (DESYREL ) 100 MG tablet Take 0.5-1 tablets (50-100 mg total) by mouth at bedtime as needed for sleep. 30 tablet 1   Atogepant  (QULIPTA ) 60 MG TABS Take 1 tablet by mouth daily. 30 tablet 11   Rimegepant Sulfate  (NURTEC) 75 MG TBDP Take 1 tablet (75 mg total) by mouth as needed. 8 tablet 11   No facility-administered medications prior to visit.    PAST MEDICAL HISTORY: Past Medical History:  Diagnosis Date   Acute low back pain due to trauma 06/28/2013   Acute renal failure 06/27/2013   Anemia    Anemia, unspecified 06/28/2013   Anxiety     Arthritis    Bilateral shoulder pain 03/08/2016   Bilateral but Left >> right  right hand dominant  Hairdresser  Spasm  in trapezius      Carpal tunnel syndrome 04/25/2017   Cervicalgia 07/30/2016   Chronic lower back pain 06/27/2013   Chronic pain of left knee 06/05/2019   Class 2 severe obesity due to excess calories with serious comorbidity and body mass index (BMI) of 37.0 to 37.9 in adult 07/08/2020   Colon polyps    x 3   Costochondritis 03/01/2016   De Quervain's tenosynovitis, bilateral 07/23/2015   Dehydration 06/28/2013   Depression    Edema of both lower extremities    Family history of adverse reaction to anesthesia    makes mother loopy   Gastric ulcer    GERD (gastroesophageal reflux disease)    Graves disease    History of pancytopenia    of unknown etiology    Hyperlipidemia    Hypertension    Hypokalemia 06/27/2013   Hypothyroidism    IBS (irritable bowel syndrome)    Iron  deficiency 12/16/2020   Migraine    Neutropenia 07/11/2015   Osteoarthritis    Primary osteoarthritis of left knee 06/05/2019   Pure hypercholesterolemia 03/03/2016   Syncope and collapse 06/27/2013   Synovitis of shoulder 05/21/2016   Thyroid  disease    Vitamin D  deficiency     PAST SURGICAL HISTORY: Past Surgical History:  Procedure Laterality Date   COLONOSCOPY     GASTRIC BYPASS     JOINT REPLACEMENT N/A    [employer] 06/12/2020   POLYPECTOMY     TOTAL KNEE ARTHROPLASTY Left 07/30/2019   Procedure: LEFT TOTAL KNEE ARTHROPLASTY;  Surgeon: Jerri Kay HERO, MD;  Location: [facility] OR;  Service: Orthopedics;  Laterality: Left;    FAMILY HISTORY: Family History  Problem Relation Age of Onset   Hypertension Mother    Gout Mother    Diabetes Mother    High Cholesterol Mother    Anxiety disorder Mother    Obesity Mother    Stroke Father    Hypertension Sister    Alcohol abuse Sister    Stroke Sister    Pancreatic cancer Sister    Diabetes Maternal Aunt        x 2   Liver cancer  Maternal Aunt    Alcohol abuse Maternal Grandfather    Early death Maternal Grandfather    Alcohol abuse Paternal Grandmother    Early death Paternal Grandmother    Colon cancer Neg Hx    Kidney disease Neg Hx    Colon polyps Neg Hx    Esophageal cancer Neg Hx    Rectal cancer Neg Hx    Stomach cancer Neg Hx     SOCIAL HISTORY: Social History   Socioeconomic History   Marital status: Single    Spouse name: Not on file   Number of children: 0   Years of education: some college   Highest education level: Not on file  Occupational History   Occupation: Psychologist, [employer]  Tobacco Use   Smoking status: Never   Smokeless tobacco: Never  Vaping Use   Vaping status: Never Used  Substance and Sexual Activity   Alcohol use: Yes    Alcohol/week: 2.0 standard drinks of alcohol    Types: 2 Standard drinks or equivalent per week    Comment: occasional   Drug use: No   Sexual activity: Not Currently  Other Topics Concern   Not on file  Social History Narrative   Lives at home with her mother.   Right-handed.   One cup caffeine per day.   Social Drivers  of Health   Financial Resource Strain: Not on file  Food Insecurity: Low Risk (08/02/2024)   Received from [facility]   Hunger Vital Sign    Within the past 12 months, you worried that your food would run out before you got money to buy more: Never true    Within the past 12 months, the food you bought just didn't last and you didn't have money to get more. : Never true  Transportation Needs: No Transportation Needs (08/02/2024)   Received from [facility]    In the past 12 months, has lack of reliable transportation kept you from medical appointments, meetings, work or from getting things needed for daily living? : No  Physical Activity: Not on file  Stress: Not on file  Social Connections: Not on file  Intimate Partner Violence: Not on file    PHYSICAL EXAM  Vitals:   10/03/24 0819 10/03/24 0824  BP: (!)  151/83 (!) 157/83    There is no height or weight on file to calculate BMI.  Generalized: Well developed, in no acute distress, anxious, tired looking Neurological examination  Mentation: Alert oriented to time, place, history taking. Follows all commands speech and language fluent Cranial nerve II-XII: Pupils were equal round reactive to light. Extraocular movements were full, visual field were full on confrontational test. Facial sensation and strength were normal. Head turning and shoulder shrug were normal and symmetric. Motor: The motor testing reveals 5 over 5 strength of all 4 extremities. Good symmetric motor tone is noted throughout.  Sensory: Sensory testing is intact to soft touch on all 4 extremities. No evidence of extinction is noted.  Coordination: Cerebellar testing reveals good finger-nose-finger Gait and station: Gait is normal.   DIAGNOSTIC DATA (LABS, IMAGING, TESTING) - I reviewed patient records, labs, notes, testing and imaging myself where available.  Lab Results  Component Value Date   WBC 2.8 (L) 09/13/2023   HGB 10.3 (L) 09/13/2023   HCT 33.6 (L) 09/13/2023   MCV 80.6 09/13/2023   PLT 327.0 09/13/2023      Component Value Date/Time   NA 142 06/02/2023 1021   NA 145 (H) 06/21/2022 1608   K 4.0 06/02/2023 1021   CL 108 06/02/2023 1021   CO2 27 06/02/2023 1021   GLUCOSE 82 06/02/2023 1021   BUN 7 06/02/2023 1021   BUN 13 06/21/2022 1608   CREATININE 0.78 06/02/2023 1021   CREATININE 0.80 12/01/2020 1302   CREATININE 1.00 03/01/2016 1053   CALCIUM  9.2 06/02/2023 1021   PROT 6.5 06/02/2023 1021   PROT 7.0 06/21/2022 1608   ALBUMIN 3.9 06/02/2023 1021   ALBUMIN 4.3 06/21/2022 1608   AST 19 06/02/2023 1021   AST 21 12/01/2020 1302   ALT 10 06/02/2023 1021   ALT 15 12/01/2020 1302   ALKPHOS 86 06/02/2023 1021   BILITOT 0.4 06/02/2023 1021   BILITOT <0.2 06/21/2022 1608   BILITOT 0.3 12/01/2020 1302   GFRNONAA >60 12/01/2020 1302   GFRNONAA 64  03/01/2016 1053   GFRAA 84 10/03/2020 1030   GFRAA 74 03/01/2016 1053   Lab Results  Component Value Date   CHOL 207 (H) 09/13/2023   HDL 77.80 09/13/2023   LDLCALC 114 (H) 09/13/2023   TRIG 79.0 09/13/2023   CHOLHDL 3 09/13/2023   Lab Results  Component Value Date   HGBA1C 5.6 03/04/2022   Lab Results  Component Value Date   VITAMINB12 316 12/01/2020   Lab Results  Component Value  Date   TSH 5.93 (H) 09/13/2023    Lauraine Born, AGNP-C, DNP 10/03/2024, 8:44 AM [facility] [address], [address] [place], [place] [address] [phone]

## 2024-10-04 ENCOUNTER — Other Ambulatory Visit (HOSPITAL_COMMUNITY): Payer: Self-pay

## 2024-10-05 ENCOUNTER — Other Ambulatory Visit (HOSPITAL_COMMUNITY): Payer: Self-pay

## 2024-10-05 ENCOUNTER — Other Ambulatory Visit: Payer: Self-pay

## 2024-10-12 ENCOUNTER — Other Ambulatory Visit (HOSPITAL_COMMUNITY): Payer: Self-pay

## 2024-10-13 ENCOUNTER — Other Ambulatory Visit (HOSPITAL_COMMUNITY): Payer: Self-pay

## 2024-10-15 ENCOUNTER — Other Ambulatory Visit (HOSPITAL_COMMUNITY): Payer: Self-pay

## 2024-10-23 ENCOUNTER — Encounter: Payer: Self-pay | Admitting: Physician Assistant

## 2024-10-28 ENCOUNTER — Other Ambulatory Visit (HOSPITAL_COMMUNITY): Payer: Self-pay

## 2024-10-29 ENCOUNTER — Other Ambulatory Visit: Payer: Self-pay

## 2024-10-29 ENCOUNTER — Encounter: Payer: Self-pay | Admitting: Pharmacist

## 2024-11-01 ENCOUNTER — Other Ambulatory Visit: Payer: Self-pay

## 2024-11-02 ENCOUNTER — Other Ambulatory Visit: Payer: Self-pay

## 2024-11-02 ENCOUNTER — Other Ambulatory Visit (HOSPITAL_COMMUNITY): Payer: Self-pay

## 2024-11-06 ENCOUNTER — Other Ambulatory Visit (HOSPITAL_COMMUNITY): Payer: Self-pay

## 2024-11-06 ENCOUNTER — Other Ambulatory Visit: Payer: Self-pay

## 2024-11-06 MED ORDER — MELOXICAM 15 MG PO TABS
15.0000 mg | ORAL_TABLET | Freq: Every day | ORAL | 1 refills | Status: AC
  Filled 2024-11-09: qty 30, 30d supply, fill #0

## 2024-11-06 MED ORDER — GABAPENTIN 100 MG PO CAPS
100.0000 mg | ORAL_CAPSULE | Freq: Three times a day (TID) | ORAL | 0 refills | Status: AC
  Filled 2024-11-06: qty 270, 90d supply, fill #0

## 2024-11-07 ENCOUNTER — Other Ambulatory Visit: Payer: Self-pay

## 2024-11-09 ENCOUNTER — Other Ambulatory Visit (HOSPITAL_COMMUNITY): Payer: Self-pay

## 2024-11-09 ENCOUNTER — Encounter: Payer: Self-pay | Admitting: Physician Assistant

## 2024-11-13 ENCOUNTER — Other Ambulatory Visit (HOSPITAL_COMMUNITY): Payer: Self-pay

## 2024-11-25 ENCOUNTER — Other Ambulatory Visit: Payer: Self-pay | Admitting: Family

## 2024-11-25 DIAGNOSIS — E039 Hypothyroidism, unspecified: Secondary | ICD-10-CM

## 2024-11-25 DIAGNOSIS — E782 Mixed hyperlipidemia: Secondary | ICD-10-CM

## 2024-11-26 ENCOUNTER — Other Ambulatory Visit: Payer: Self-pay

## 2024-11-26 ENCOUNTER — Other Ambulatory Visit (HOSPITAL_COMMUNITY): Payer: Self-pay

## 2024-11-27 ENCOUNTER — Other Ambulatory Visit: Payer: Self-pay

## 2024-11-30 ENCOUNTER — Other Ambulatory Visit: Payer: Self-pay

## 2025-04-18 ENCOUNTER — Ambulatory Visit: Admitting: Neurology
